# Patient Record
Sex: Male | Born: 1940 | Race: White | Hispanic: No | Marital: Married | Smoking: Former smoker
Health system: Southern US, Community
[De-identification: ages and names within clinical notes are randomized; demographics above are authoritative.]

## PROBLEM LIST (undated history)

## (undated) DIAGNOSIS — M545 Low back pain, unspecified: Secondary | ICD-10-CM

## (undated) DIAGNOSIS — I10 Essential (primary) hypertension: Secondary | ICD-10-CM

## (undated) DIAGNOSIS — G629 Polyneuropathy, unspecified: Secondary | ICD-10-CM

## (undated) DIAGNOSIS — G4700 Insomnia, unspecified: Secondary | ICD-10-CM

## (undated) DIAGNOSIS — F0390 Unspecified dementia without behavioral disturbance: Secondary | ICD-10-CM

## (undated) DIAGNOSIS — E785 Hyperlipidemia, unspecified: Secondary | ICD-10-CM

## (undated) DIAGNOSIS — F0280 Dementia in other diseases classified elsewhere without behavioral disturbance: Secondary | ICD-10-CM

## (undated) DIAGNOSIS — Z85828 Personal history of other malignant neoplasm of skin: Secondary | ICD-10-CM

## (undated) DIAGNOSIS — G4710 Hypersomnia, unspecified: Secondary | ICD-10-CM

## (undated) DIAGNOSIS — Q549 Hypospadias, unspecified: Secondary | ICD-10-CM

## (undated) DIAGNOSIS — M5430 Sciatica, unspecified side: Secondary | ICD-10-CM

## (undated) DIAGNOSIS — M791 Myalgia, unspecified site: Secondary | ICD-10-CM

## (undated) DIAGNOSIS — E079 Disorder of thyroid, unspecified: Secondary | ICD-10-CM

## (undated) DIAGNOSIS — Z9889 Other specified postprocedural states: Secondary | ICD-10-CM

## (undated) DIAGNOSIS — E119 Type 2 diabetes mellitus without complications: Secondary | ICD-10-CM

## (undated) HISTORY — DX: Disorder of thyroid, unspecified: E07.9

## (undated) HISTORY — DX: Hyperlipidemia, unspecified: E78.5

## (undated) HISTORY — DX: Polyneuropathy, unspecified: G62.9

## (undated) HISTORY — DX: Other specified postprocedural states: Z98.890

## (undated) HISTORY — DX: Insomnia, unspecified: G47.00

## (undated) HISTORY — DX: Hypersomnia, unspecified: G47.10

## (undated) HISTORY — DX: Myalgia, unspecified site: M79.10

## (undated) HISTORY — DX: Essential (primary) hypertension: I10

## (undated) HISTORY — DX: Low back pain, unspecified: M54.50

## (undated) HISTORY — DX: Low back pain: M54.5

## (undated) HISTORY — PX: URETHRAL STRICTURE DILATATION: SHX477

## (undated) HISTORY — DX: Sciatica, unspecified side: M54.30

## (undated) HISTORY — DX: Type 2 diabetes mellitus without complications: E11.9

---
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
## 1998-12-22 HISTORY — PX: OTHER SURGICAL HISTORY: SHX169

## 2003-12-23 LAB — HM COLONOSCOPY

## 2006-08-03 DIAGNOSIS — M5430 Sciatica, unspecified side: Secondary | ICD-10-CM | POA: Insufficient documentation

## 2007-11-22 DIAGNOSIS — N35919 Unspecified urethral stricture, male, unspecified site: Secondary | ICD-10-CM | POA: Insufficient documentation

## 2010-12-30 ENCOUNTER — Emergency Department: Payer: Self-pay | Admitting: Emergency Medicine

## 2010-12-30 IMAGING — CR LEFT WRIST - COMPLETE 3+ VIEW
1 series · 4 of 4 positions shown · non-contrast
Comparison: none

REASON FOR EXAM: painful
COMMENTS:

PROCEDURE:     DXR - DXR WRIST LT COMP WITH OBLIQUES  - [DATE] [DATE]
RESULT:     Images of the left wrist show severe radiocarpal degenerative
change with prominent areas spurring along the lateral aspect of the
scaphoid. A definite fractures not identified. No bony destruction is
evident.

[Series 1: view not recorded · 0.17mm/px · 4 of 4 slices shown]
[im 1/4]
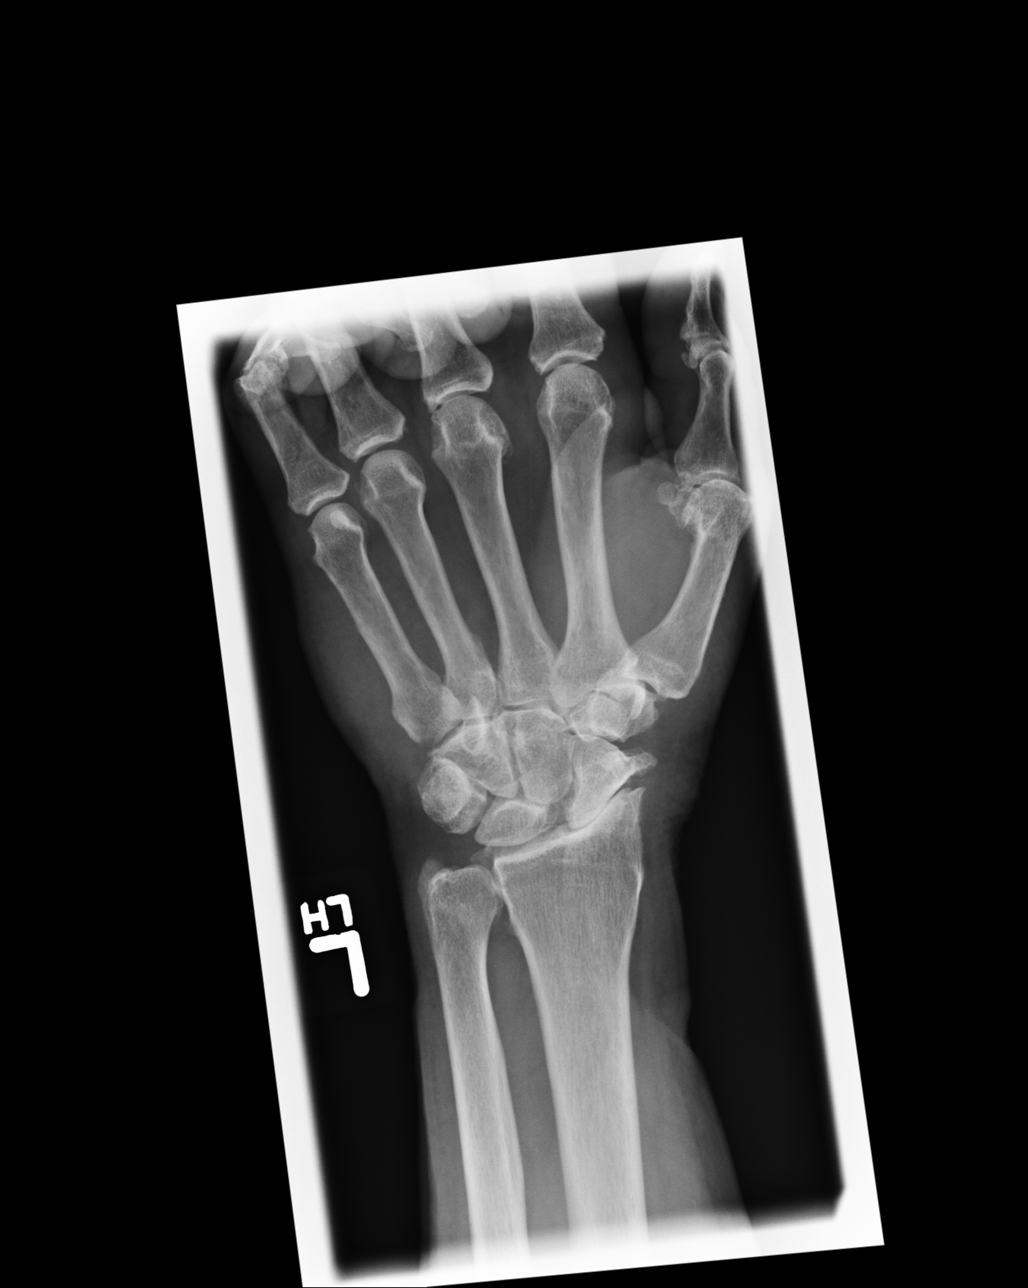
[im 2/4]
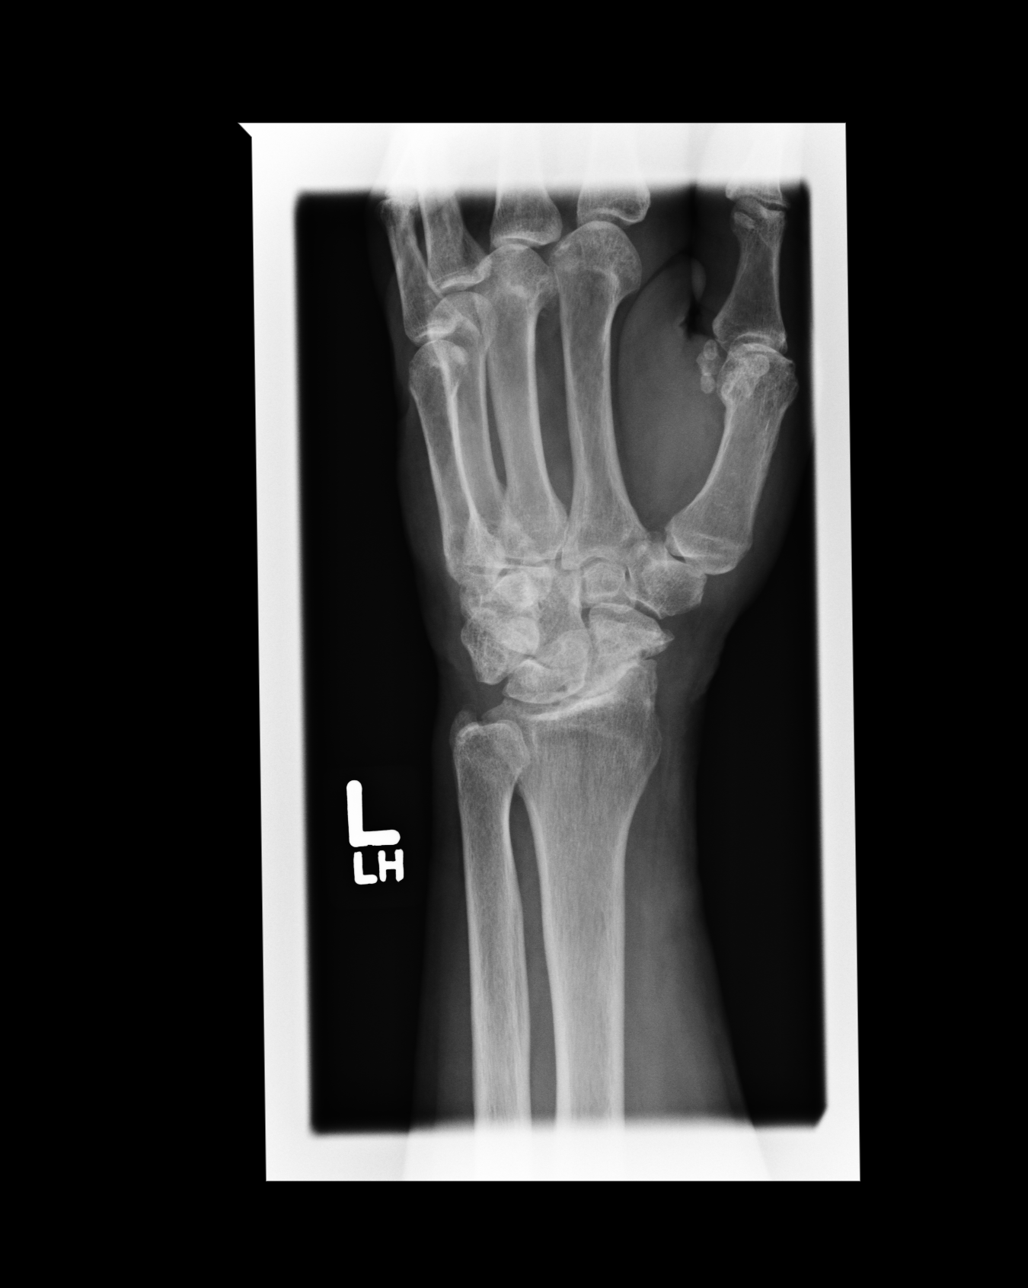
[im 3/4]
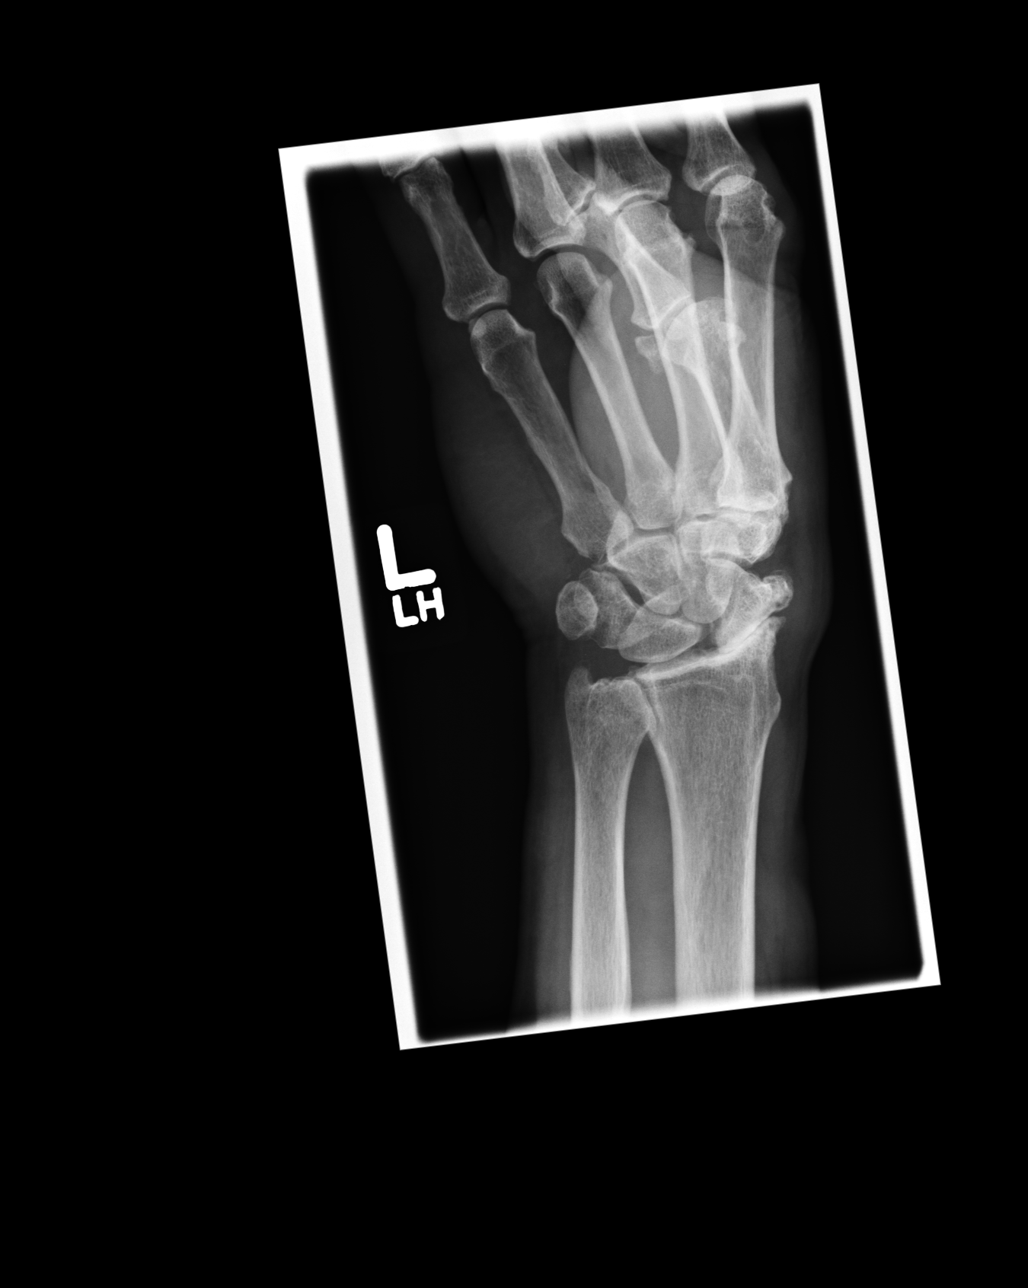
[im 4/4]
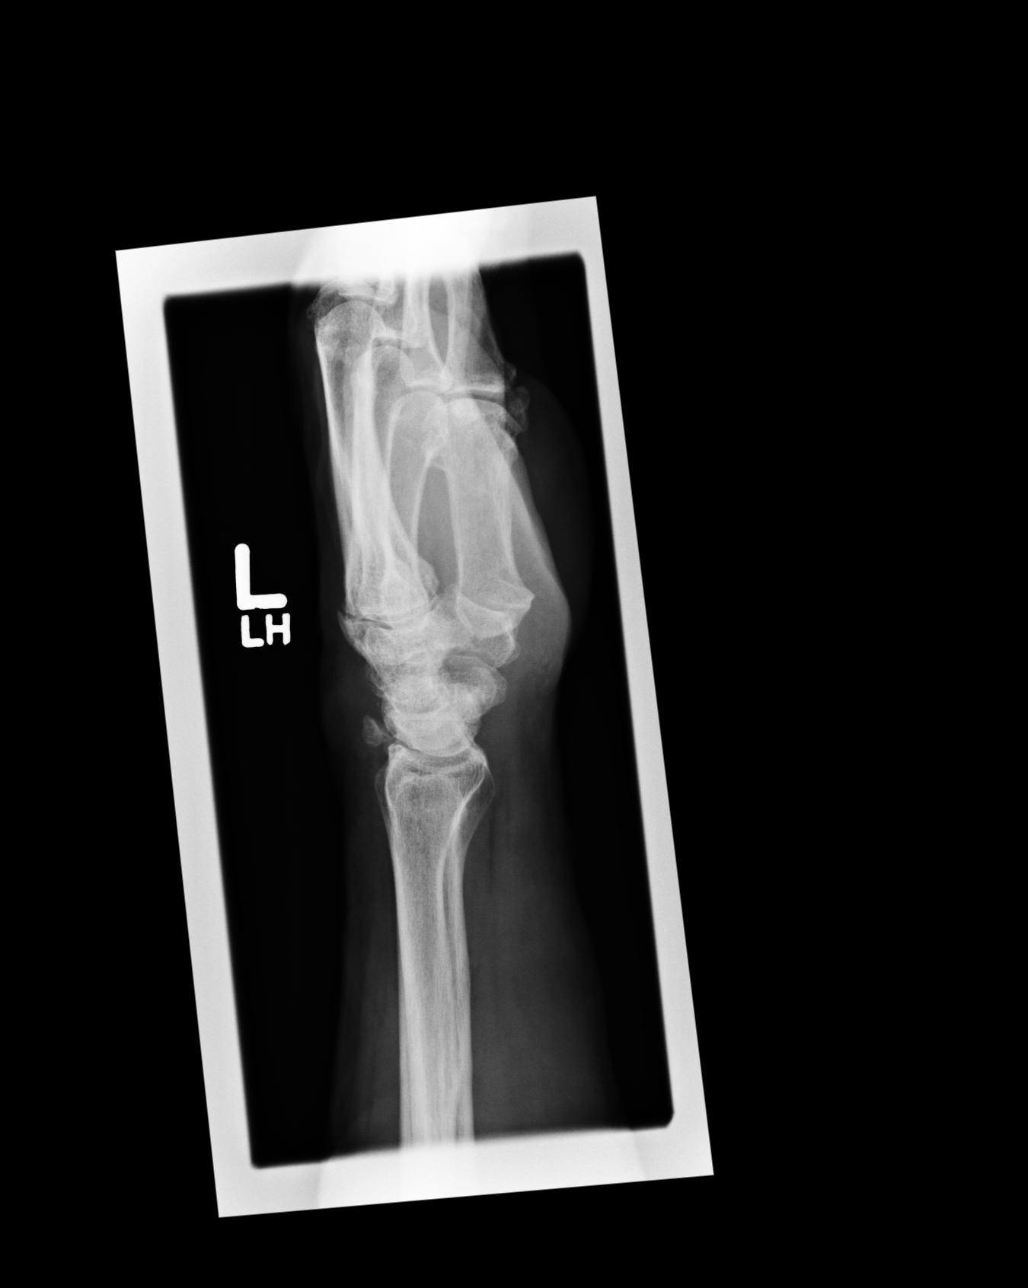

[4 of 4 positions shown; findings below may reference images not displayed]

IMPRESSION: Severe osteoarthritic changes in the carpus. If occult
fracture is a concern then MRI would be suggested.

## 2011-08-13 IMAGING — CR DG FOOT COMPLETE 3+V*R*
1 series · 1 of 1 positions shown · non-contrast
Comparison: none

[view not recorded]
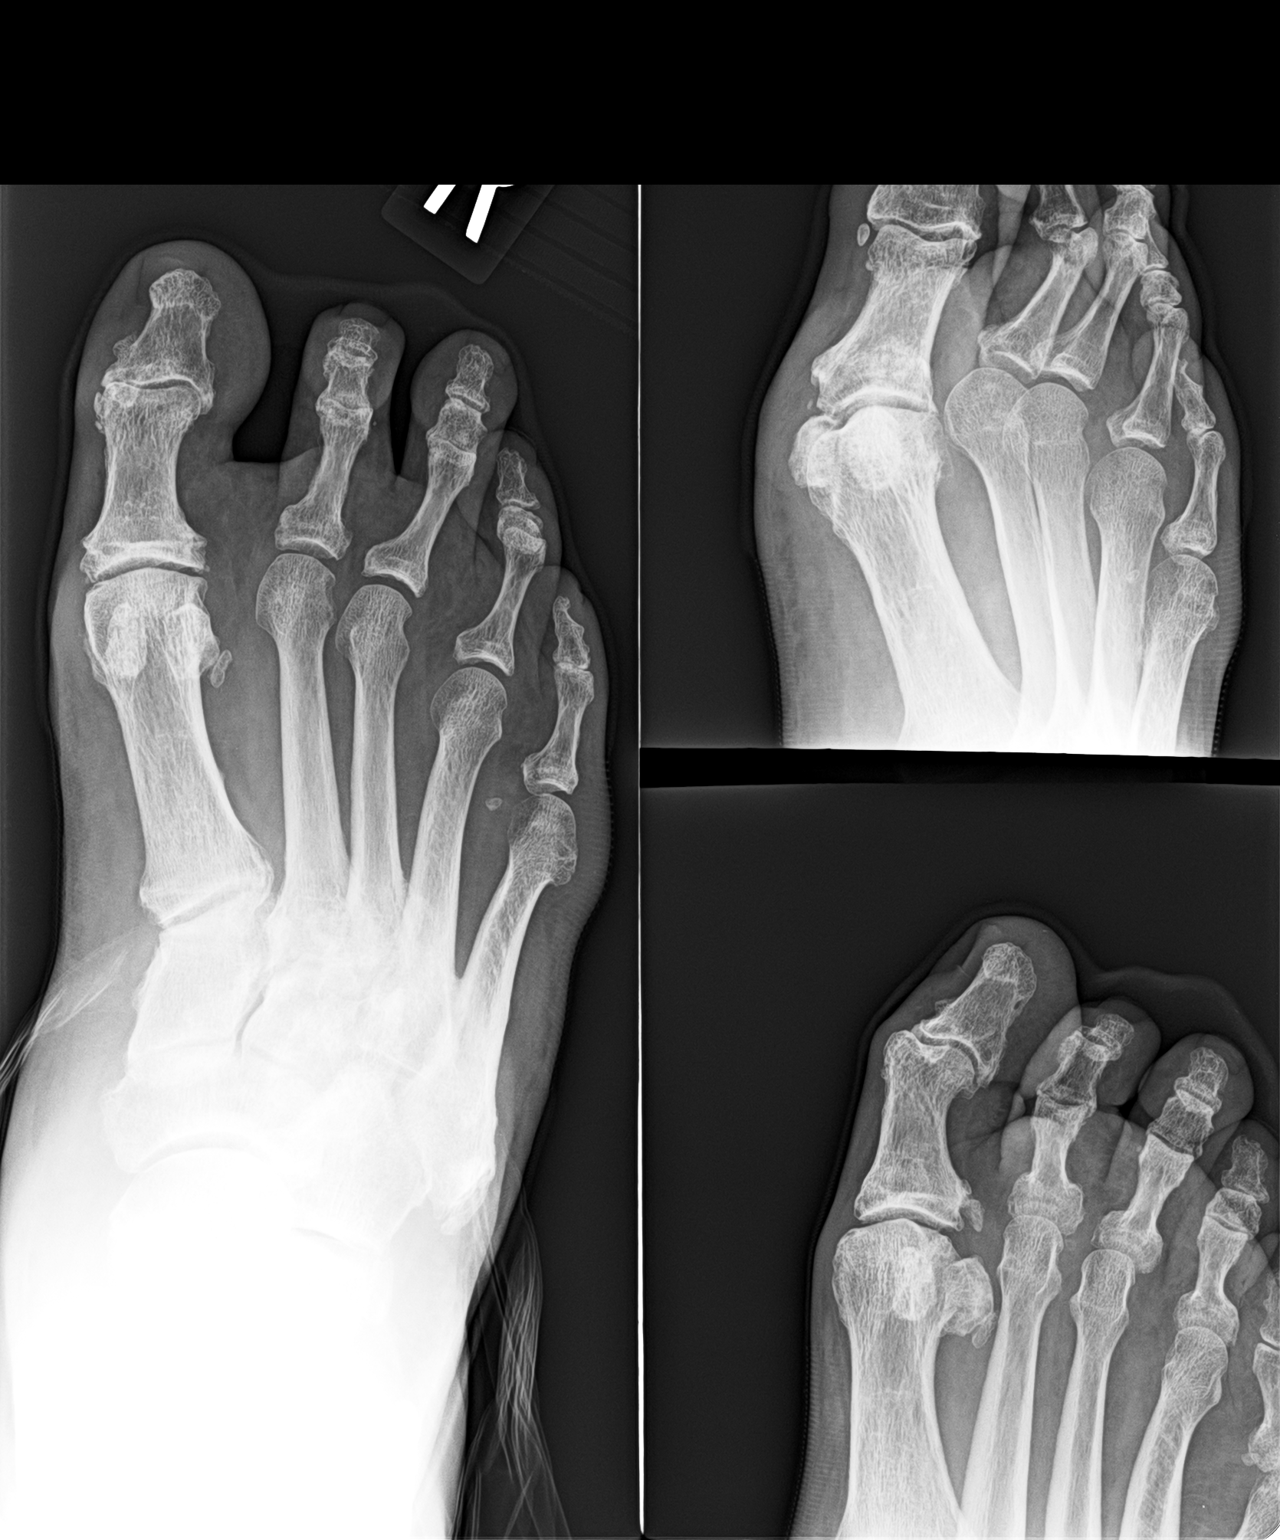

[1 of 1 positions shown; findings below may reference images not displayed]

Canned report from images found in remote index.

Refer to host system for actual result text.

## 2012-05-27 DIAGNOSIS — M19049 Primary osteoarthritis, unspecified hand: Secondary | ICD-10-CM | POA: Diagnosis not present

## 2012-05-27 DIAGNOSIS — IMO0002 Reserved for concepts with insufficient information to code with codable children: Secondary | ICD-10-CM | POA: Diagnosis not present

## 2012-06-09 DIAGNOSIS — I10 Essential (primary) hypertension: Secondary | ICD-10-CM | POA: Diagnosis not present

## 2012-06-09 DIAGNOSIS — Z0000 Encounter for general adult medical examination without abnormal findings: Secondary | ICD-10-CM | POA: Diagnosis not present

## 2012-06-09 DIAGNOSIS — E119 Type 2 diabetes mellitus without complications: Secondary | ICD-10-CM | POA: Diagnosis not present

## 2012-06-09 DIAGNOSIS — G609 Hereditary and idiopathic neuropathy, unspecified: Secondary | ICD-10-CM | POA: Diagnosis not present

## 2012-06-09 DIAGNOSIS — E663 Overweight: Secondary | ICD-10-CM | POA: Diagnosis not present

## 2012-06-09 DIAGNOSIS — Z125 Encounter for screening for malignant neoplasm of prostate: Secondary | ICD-10-CM | POA: Diagnosis not present

## 2012-06-09 DIAGNOSIS — R748 Abnormal levels of other serum enzymes: Secondary | ICD-10-CM | POA: Diagnosis not present

## 2012-06-09 LAB — HM HEPATITIS C SCREENING LAB: HM Hepatitis Screen: NEGATIVE

## 2012-06-15 DIAGNOSIS — H6120 Impacted cerumen, unspecified ear: Secondary | ICD-10-CM | POA: Diagnosis not present

## 2012-06-15 DIAGNOSIS — H903 Sensorineural hearing loss, bilateral: Secondary | ICD-10-CM | POA: Diagnosis not present

## 2012-07-07 DIAGNOSIS — L821 Other seborrheic keratosis: Secondary | ICD-10-CM | POA: Diagnosis not present

## 2012-07-07 DIAGNOSIS — L570 Actinic keratosis: Secondary | ICD-10-CM | POA: Diagnosis not present

## 2012-07-07 DIAGNOSIS — D235 Other benign neoplasm of skin of trunk: Secondary | ICD-10-CM | POA: Diagnosis not present

## 2012-07-07 DIAGNOSIS — D485 Neoplasm of uncertain behavior of skin: Secondary | ICD-10-CM | POA: Diagnosis not present

## 2012-09-16 DIAGNOSIS — Z23 Encounter for immunization: Secondary | ICD-10-CM | POA: Diagnosis not present

## 2012-10-19 DIAGNOSIS — Z23 Encounter for immunization: Secondary | ICD-10-CM | POA: Diagnosis not present

## 2012-10-19 DIAGNOSIS — E119 Type 2 diabetes mellitus without complications: Secondary | ICD-10-CM | POA: Diagnosis not present

## 2012-10-19 DIAGNOSIS — G608 Other hereditary and idiopathic neuropathies: Secondary | ICD-10-CM | POA: Diagnosis not present

## 2012-10-19 DIAGNOSIS — I10 Essential (primary) hypertension: Secondary | ICD-10-CM | POA: Diagnosis not present

## 2013-01-26 DIAGNOSIS — B488 Other specified mycoses: Secondary | ICD-10-CM | POA: Diagnosis not present

## 2013-01-26 DIAGNOSIS — L03039 Cellulitis of unspecified toe: Secondary | ICD-10-CM | POA: Diagnosis not present

## 2013-01-28 DIAGNOSIS — IMO0002 Reserved for concepts with insufficient information to code with codable children: Secondary | ICD-10-CM | POA: Diagnosis not present

## 2013-01-28 DIAGNOSIS — S91109A Unspecified open wound of unspecified toe(s) without damage to nail, initial encounter: Secondary | ICD-10-CM | POA: Diagnosis not present

## 2013-02-04 DIAGNOSIS — IMO0002 Reserved for concepts with insufficient information to code with codable children: Secondary | ICD-10-CM | POA: Diagnosis not present

## 2013-02-04 DIAGNOSIS — B351 Tinea unguium: Secondary | ICD-10-CM | POA: Diagnosis not present

## 2013-02-04 DIAGNOSIS — R897 Abnormal histological findings in specimens from other organs, systems and tissues: Secondary | ICD-10-CM | POA: Diagnosis not present

## 2013-02-14 DIAGNOSIS — S6990XA Unspecified injury of unspecified wrist, hand and finger(s), initial encounter: Secondary | ICD-10-CM | POA: Diagnosis not present

## 2013-02-14 DIAGNOSIS — M25549 Pain in joints of unspecified hand: Secondary | ICD-10-CM | POA: Diagnosis not present

## 2013-02-14 DIAGNOSIS — S63509A Unspecified sprain of unspecified wrist, initial encounter: Secondary | ICD-10-CM | POA: Diagnosis not present

## 2013-02-14 DIAGNOSIS — Y92009 Unspecified place in unspecified non-institutional (private) residence as the place of occurrence of the external cause: Secondary | ICD-10-CM | POA: Diagnosis not present

## 2013-02-17 DIAGNOSIS — M25539 Pain in unspecified wrist: Secondary | ICD-10-CM | POA: Diagnosis not present

## 2013-02-17 DIAGNOSIS — M19039 Primary osteoarthritis, unspecified wrist: Secondary | ICD-10-CM | POA: Diagnosis not present

## 2013-03-15 DIAGNOSIS — Z23 Encounter for immunization: Secondary | ICD-10-CM | POA: Diagnosis not present

## 2013-03-15 DIAGNOSIS — I10 Essential (primary) hypertension: Secondary | ICD-10-CM | POA: Diagnosis not present

## 2013-03-15 DIAGNOSIS — G4700 Insomnia, unspecified: Secondary | ICD-10-CM | POA: Diagnosis not present

## 2013-05-17 ENCOUNTER — Emergency Department: Payer: Self-pay | Admitting: Emergency Medicine

## 2013-05-17 DIAGNOSIS — S63509A Unspecified sprain of unspecified wrist, initial encounter: Secondary | ICD-10-CM | POA: Diagnosis not present

## 2013-05-17 DIAGNOSIS — I10 Essential (primary) hypertension: Secondary | ICD-10-CM | POA: Diagnosis not present

## 2013-05-17 DIAGNOSIS — L259 Unspecified contact dermatitis, unspecified cause: Secondary | ICD-10-CM | POA: Diagnosis not present

## 2013-05-17 DIAGNOSIS — M19039 Primary osteoarthritis, unspecified wrist: Secondary | ICD-10-CM | POA: Diagnosis not present

## 2013-05-17 IMAGING — CR LEFT WRIST - COMPLETE 3+ VIEW
1 series · 4 of 4 positions shown · non-contrast
Comparison: none

REASON FOR EXAM: Pain and swelling at wrist
COMMENTS:

PROCEDURE:     DXR - DXR WRIST LT COMP WITH OBLIQUES  - [DATE] [DATE]
RESULT:     Comparison: [DATE]

[Series 1: oblique · 0.17mm/px · 4 of 4 slices shown]
[im 1/4]
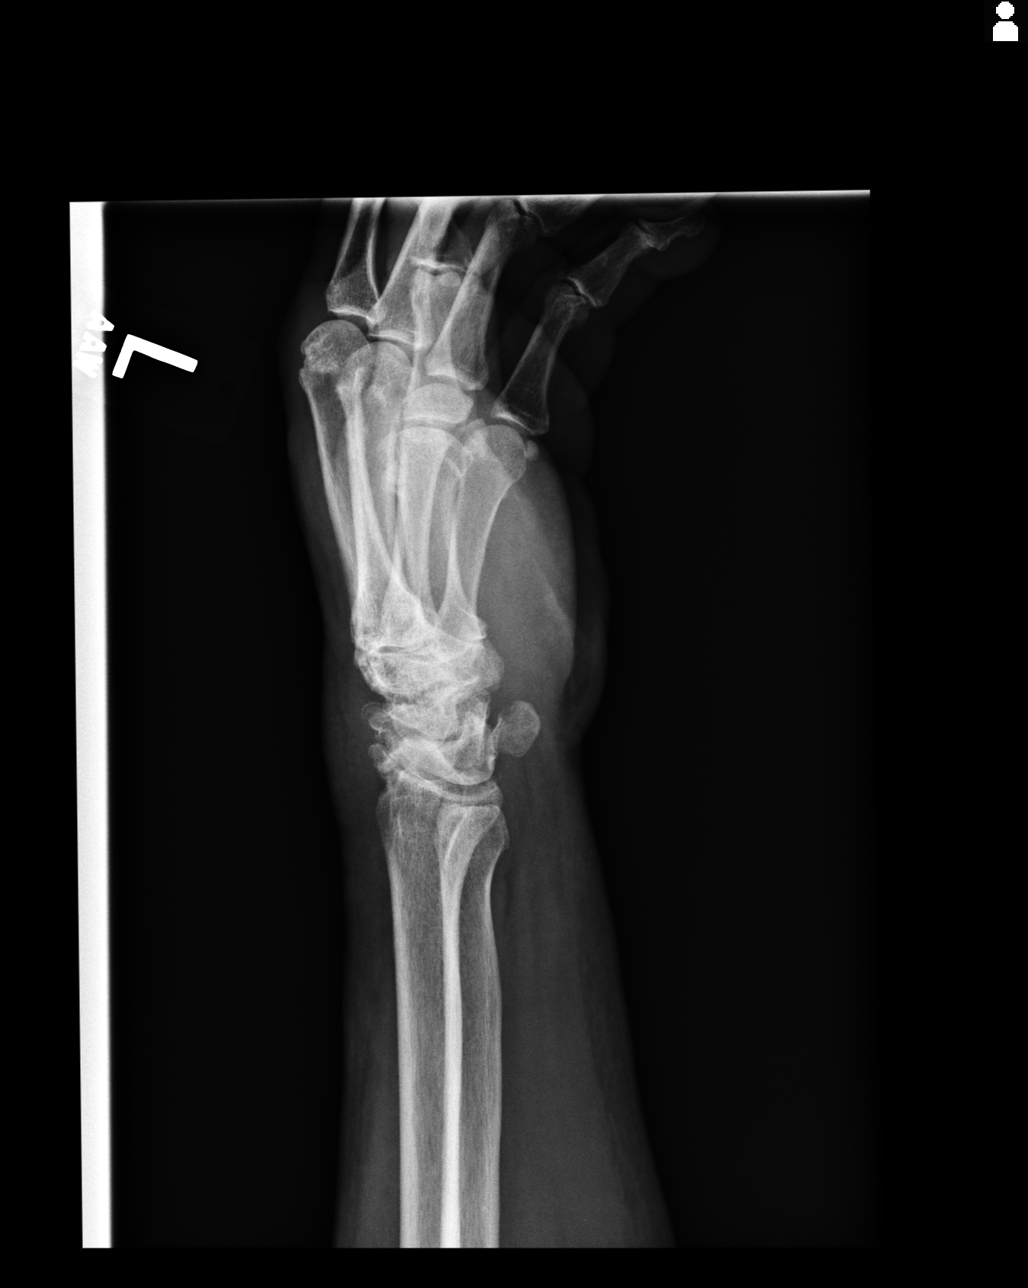
[im 2/4]
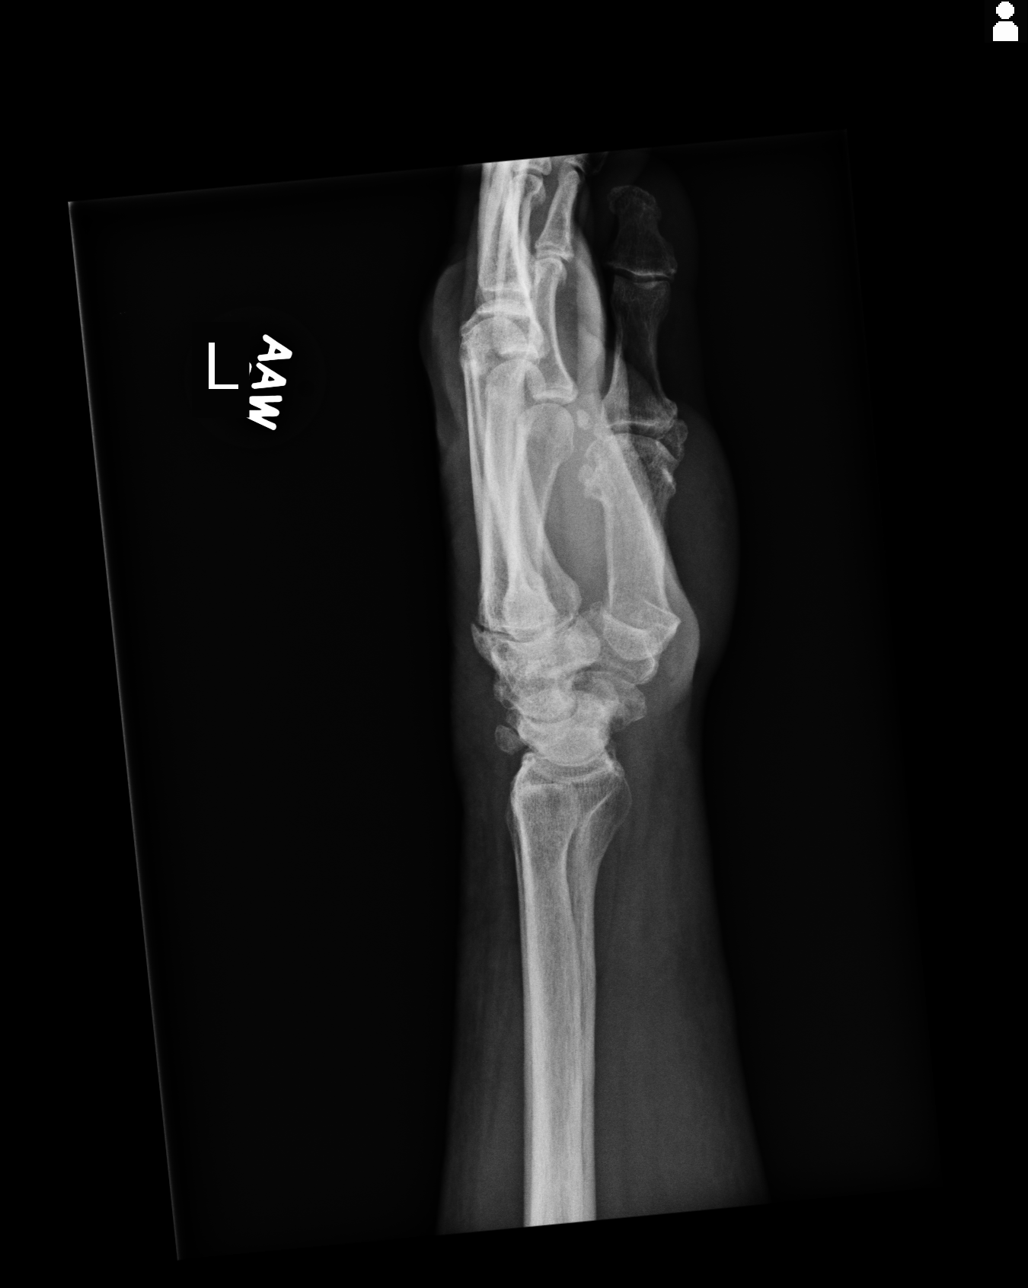
[im 3/4]
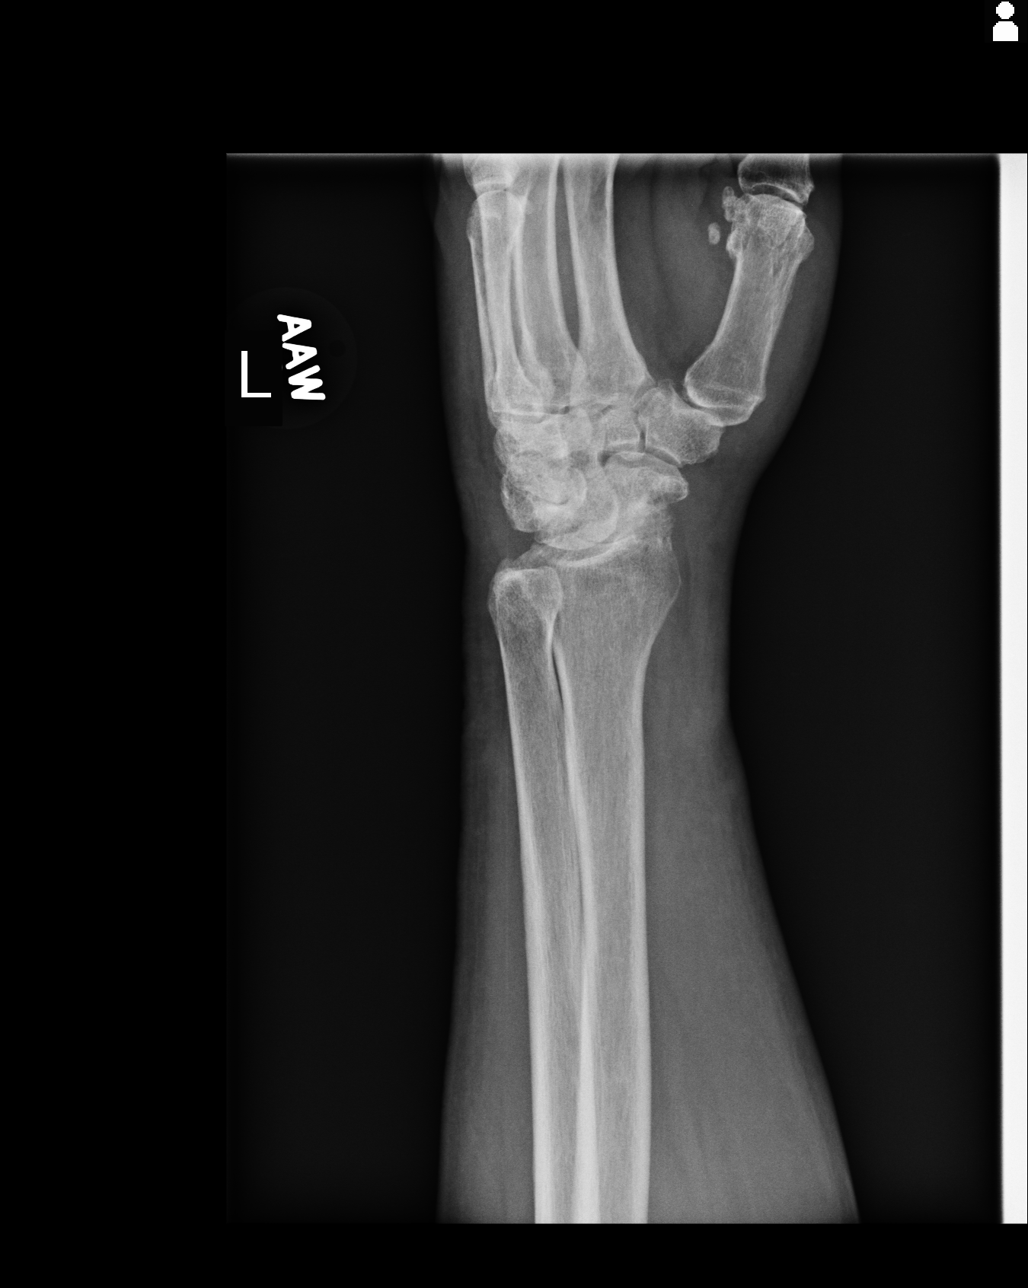
[im 4/4]
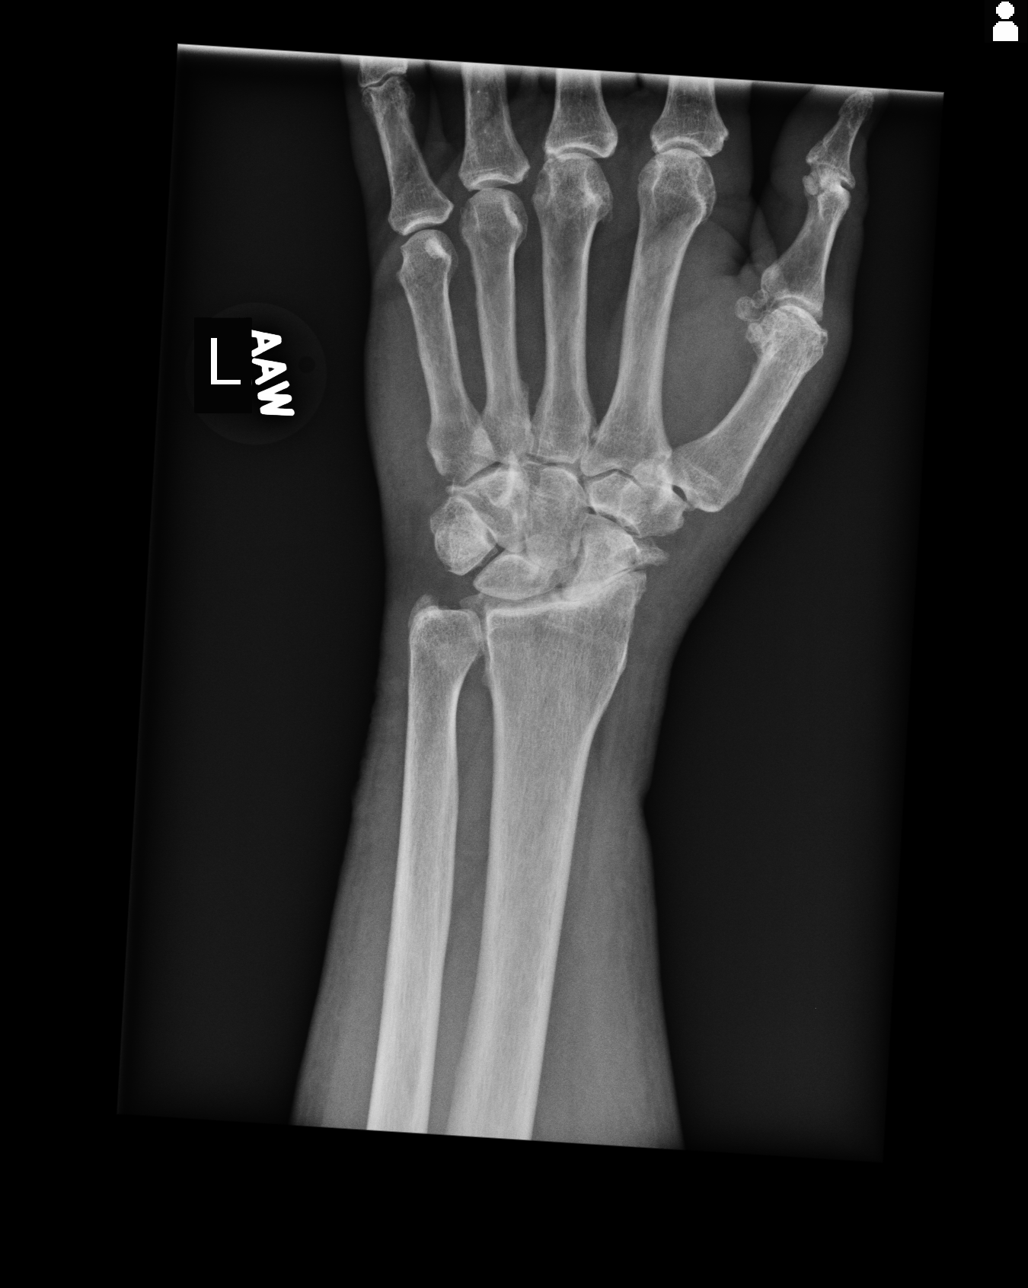

[4 of 4 positions shown; findings below may reference images not displayed]

FINDINGS: No acute fracture seen. Small ossific densities along the dorsal aspect of
the wrist are likely sequela of old prior trauma. There is degenerative
change of the radiocarpal articulation, with chronic remodeling of the
scaphoid. This is similar to slightly increased from prior.
IMPRESSION: No acute fracture. Degenerative changes in the wri[REDACTED]

## 2013-05-18 DIAGNOSIS — Z23 Encounter for immunization: Secondary | ICD-10-CM | POA: Diagnosis not present

## 2013-05-18 DIAGNOSIS — L259 Unspecified contact dermatitis, unspecified cause: Secondary | ICD-10-CM | POA: Diagnosis not present

## 2013-05-18 DIAGNOSIS — E119 Type 2 diabetes mellitus without complications: Secondary | ICD-10-CM | POA: Diagnosis not present

## 2013-05-18 DIAGNOSIS — IMO0002 Reserved for concepts with insufficient information to code with codable children: Secondary | ICD-10-CM | POA: Diagnosis not present

## 2013-05-18 DIAGNOSIS — G4700 Insomnia, unspecified: Secondary | ICD-10-CM | POA: Diagnosis not present

## 2013-05-18 DIAGNOSIS — M19049 Primary osteoarthritis, unspecified hand: Secondary | ICD-10-CM | POA: Diagnosis not present

## 2013-06-23 DIAGNOSIS — M19049 Primary osteoarthritis, unspecified hand: Secondary | ICD-10-CM | POA: Diagnosis not present

## 2013-07-07 DIAGNOSIS — L570 Actinic keratosis: Secondary | ICD-10-CM | POA: Diagnosis not present

## 2013-07-07 DIAGNOSIS — D235 Other benign neoplasm of skin of trunk: Secondary | ICD-10-CM | POA: Diagnosis not present

## 2013-07-21 DIAGNOSIS — G608 Other hereditary and idiopathic neuropathies: Secondary | ICD-10-CM | POA: Diagnosis not present

## 2013-07-21 DIAGNOSIS — I10 Essential (primary) hypertension: Secondary | ICD-10-CM | POA: Diagnosis not present

## 2013-07-21 DIAGNOSIS — D518 Other vitamin B12 deficiency anemias: Secondary | ICD-10-CM | POA: Diagnosis not present

## 2013-07-21 DIAGNOSIS — E559 Vitamin D deficiency, unspecified: Secondary | ICD-10-CM | POA: Diagnosis not present

## 2014-01-13 DIAGNOSIS — E119 Type 2 diabetes mellitus without complications: Secondary | ICD-10-CM | POA: Diagnosis not present

## 2014-01-13 DIAGNOSIS — I10 Essential (primary) hypertension: Secondary | ICD-10-CM | POA: Diagnosis not present

## 2014-01-13 DIAGNOSIS — E785 Hyperlipidemia, unspecified: Secondary | ICD-10-CM | POA: Diagnosis not present

## 2014-01-13 DIAGNOSIS — E559 Vitamin D deficiency, unspecified: Secondary | ICD-10-CM | POA: Diagnosis not present

## 2014-02-10 DIAGNOSIS — H6120 Impacted cerumen, unspecified ear: Secondary | ICD-10-CM | POA: Diagnosis not present

## 2014-02-10 DIAGNOSIS — H903 Sensorineural hearing loss, bilateral: Secondary | ICD-10-CM | POA: Diagnosis not present

## 2014-03-13 DIAGNOSIS — L0291 Cutaneous abscess, unspecified: Secondary | ICD-10-CM | POA: Diagnosis not present

## 2014-03-13 DIAGNOSIS — G4700 Insomnia, unspecified: Secondary | ICD-10-CM | POA: Diagnosis not present

## 2014-03-13 DIAGNOSIS — E119 Type 2 diabetes mellitus without complications: Secondary | ICD-10-CM | POA: Diagnosis not present

## 2014-03-13 DIAGNOSIS — L0390 Cellulitis, unspecified: Secondary | ICD-10-CM | POA: Diagnosis not present

## 2014-03-13 DIAGNOSIS — E559 Vitamin D deficiency, unspecified: Secondary | ICD-10-CM | POA: Diagnosis not present

## 2014-04-12 ENCOUNTER — Ambulatory Visit: Payer: Self-pay | Admitting: Family Medicine

## 2014-04-12 DIAGNOSIS — Z7189 Other specified counseling: Secondary | ICD-10-CM | POA: Diagnosis not present

## 2014-04-12 DIAGNOSIS — I10 Essential (primary) hypertension: Secondary | ICD-10-CM | POA: Diagnosis not present

## 2014-04-12 DIAGNOSIS — E119 Type 2 diabetes mellitus without complications: Secondary | ICD-10-CM | POA: Diagnosis not present

## 2014-04-12 DIAGNOSIS — E669 Obesity, unspecified: Secondary | ICD-10-CM | POA: Diagnosis not present

## 2014-04-21 ENCOUNTER — Ambulatory Visit: Payer: Self-pay | Admitting: Family Medicine

## 2014-05-02 DIAGNOSIS — I10 Essential (primary) hypertension: Secondary | ICD-10-CM | POA: Diagnosis not present

## 2014-05-02 DIAGNOSIS — R0609 Other forms of dyspnea: Secondary | ICD-10-CM | POA: Diagnosis not present

## 2014-05-02 DIAGNOSIS — Z125 Encounter for screening for malignant neoplasm of prostate: Secondary | ICD-10-CM | POA: Diagnosis not present

## 2014-05-02 DIAGNOSIS — Z0000 Encounter for general adult medical examination without abnormal findings: Secondary | ICD-10-CM | POA: Diagnosis not present

## 2014-05-02 DIAGNOSIS — Z23 Encounter for immunization: Secondary | ICD-10-CM | POA: Diagnosis not present

## 2014-05-02 DIAGNOSIS — J9819 Other pulmonary collapse: Secondary | ICD-10-CM | POA: Diagnosis not present

## 2014-05-02 DIAGNOSIS — E119 Type 2 diabetes mellitus without complications: Secondary | ICD-10-CM | POA: Diagnosis not present

## 2014-05-10 DIAGNOSIS — H2510 Age-related nuclear cataract, unspecified eye: Secondary | ICD-10-CM | POA: Diagnosis not present

## 2014-06-27 DIAGNOSIS — IMO0001 Reserved for inherently not codable concepts without codable children: Secondary | ICD-10-CM | POA: Diagnosis not present

## 2014-06-27 DIAGNOSIS — R5383 Other fatigue: Secondary | ICD-10-CM | POA: Diagnosis not present

## 2014-06-27 DIAGNOSIS — E785 Hyperlipidemia, unspecified: Secondary | ICD-10-CM | POA: Diagnosis not present

## 2014-06-27 DIAGNOSIS — E119 Type 2 diabetes mellitus without complications: Secondary | ICD-10-CM | POA: Diagnosis not present

## 2014-06-27 DIAGNOSIS — E559 Vitamin D deficiency, unspecified: Secondary | ICD-10-CM | POA: Diagnosis not present

## 2014-06-27 DIAGNOSIS — R5381 Other malaise: Secondary | ICD-10-CM | POA: Diagnosis not present

## 2014-06-27 DIAGNOSIS — E291 Testicular hypofunction: Secondary | ICD-10-CM | POA: Diagnosis not present

## 2014-07-27 DIAGNOSIS — IMO0001 Reserved for inherently not codable concepts without codable children: Secondary | ICD-10-CM | POA: Diagnosis not present

## 2014-07-27 DIAGNOSIS — E291 Testicular hypofunction: Secondary | ICD-10-CM | POA: Diagnosis not present

## 2014-07-27 DIAGNOSIS — E785 Hyperlipidemia, unspecified: Secondary | ICD-10-CM | POA: Diagnosis not present

## 2014-07-27 DIAGNOSIS — G4710 Hypersomnia, unspecified: Secondary | ICD-10-CM | POA: Diagnosis not present

## 2014-08-31 DIAGNOSIS — N138 Other obstructive and reflux uropathy: Secondary | ICD-10-CM | POA: Diagnosis not present

## 2014-08-31 DIAGNOSIS — N401 Enlarged prostate with lower urinary tract symptoms: Secondary | ICD-10-CM | POA: Diagnosis not present

## 2014-08-31 DIAGNOSIS — Z87448 Personal history of other diseases of urinary system: Secondary | ICD-10-CM | POA: Diagnosis not present

## 2014-08-31 DIAGNOSIS — R300 Dysuria: Secondary | ICD-10-CM | POA: Diagnosis not present

## 2014-08-31 DIAGNOSIS — E785 Hyperlipidemia, unspecified: Secondary | ICD-10-CM | POA: Diagnosis not present

## 2014-09-20 DIAGNOSIS — R339 Retention of urine, unspecified: Secondary | ICD-10-CM | POA: Insufficient documentation

## 2014-09-20 DIAGNOSIS — R350 Frequency of micturition: Secondary | ICD-10-CM | POA: Diagnosis not present

## 2014-09-20 DIAGNOSIS — R351 Nocturia: Secondary | ICD-10-CM | POA: Insufficient documentation

## 2014-09-20 DIAGNOSIS — N401 Enlarged prostate with lower urinary tract symptoms: Secondary | ICD-10-CM | POA: Diagnosis not present

## 2014-09-20 DIAGNOSIS — IMO0002 Reserved for concepts with insufficient information to code with codable children: Secondary | ICD-10-CM | POA: Diagnosis not present

## 2014-10-06 DIAGNOSIS — N390 Urinary tract infection, site not specified: Secondary | ICD-10-CM | POA: Diagnosis not present

## 2014-10-06 DIAGNOSIS — I10 Essential (primary) hypertension: Secondary | ICD-10-CM | POA: Diagnosis not present

## 2014-10-06 DIAGNOSIS — E785 Hyperlipidemia, unspecified: Secondary | ICD-10-CM | POA: Diagnosis not present

## 2014-10-06 DIAGNOSIS — Z23 Encounter for immunization: Secondary | ICD-10-CM | POA: Diagnosis not present

## 2014-10-06 DIAGNOSIS — E119 Type 2 diabetes mellitus without complications: Secondary | ICD-10-CM | POA: Diagnosis not present

## 2014-10-06 LAB — HEMOGLOBIN A1C, FINGERSTICK
ESTIMATED AVERAGE GLUCOSE: 146
HEMOGLOBIN A1C: 6.7

## 2014-10-11 DIAGNOSIS — E119 Type 2 diabetes mellitus without complications: Secondary | ICD-10-CM | POA: Diagnosis not present

## 2014-10-11 DIAGNOSIS — R609 Edema, unspecified: Secondary | ICD-10-CM | POA: Diagnosis not present

## 2014-10-11 DIAGNOSIS — R0689 Other abnormalities of breathing: Secondary | ICD-10-CM | POA: Diagnosis not present

## 2014-10-11 DIAGNOSIS — R0600 Dyspnea, unspecified: Secondary | ICD-10-CM | POA: Diagnosis not present

## 2014-10-11 DIAGNOSIS — Z23 Encounter for immunization: Secondary | ICD-10-CM | POA: Diagnosis not present

## 2014-10-11 DIAGNOSIS — E785 Hyperlipidemia, unspecified: Secondary | ICD-10-CM | POA: Diagnosis not present

## 2014-10-11 DIAGNOSIS — I10 Essential (primary) hypertension: Secondary | ICD-10-CM | POA: Diagnosis not present

## 2014-10-11 LAB — LIPID PANEL
BUN: 18
CREATININE: 0.97
GLUCOSE: 218
HCT: 45.4
HEMOGLOBIN: 15 g/dL
PLATELETS: 249
Potassium: 4.5 mmol/L
SODIUM: 140
TSH: 1.05
WBC: 8.5

## 2014-11-20 DIAGNOSIS — E785 Hyperlipidemia, unspecified: Secondary | ICD-10-CM | POA: Diagnosis not present

## 2014-11-20 LAB — LIPID PANEL
CHOLESTEROL, TOTAL: 165
HDL: 54 mg/dL (ref 35–70)
LDL Cholesterol: 90 mg/dL
TRIGLYCERIDES: 104

## 2014-12-05 DIAGNOSIS — I10 Essential (primary) hypertension: Secondary | ICD-10-CM | POA: Diagnosis not present

## 2014-12-05 DIAGNOSIS — Z23 Encounter for immunization: Secondary | ICD-10-CM | POA: Diagnosis not present

## 2014-12-05 DIAGNOSIS — N419 Inflammatory disease of prostate, unspecified: Secondary | ICD-10-CM | POA: Diagnosis not present

## 2014-12-05 DIAGNOSIS — E785 Hyperlipidemia, unspecified: Secondary | ICD-10-CM | POA: Diagnosis not present

## 2014-12-05 DIAGNOSIS — R0600 Dyspnea, unspecified: Secondary | ICD-10-CM | POA: Diagnosis not present

## 2014-12-05 DIAGNOSIS — E119 Type 2 diabetes mellitus without complications: Secondary | ICD-10-CM | POA: Diagnosis not present

## 2014-12-27 DIAGNOSIS — Z23 Encounter for immunization: Secondary | ICD-10-CM | POA: Diagnosis not present

## 2014-12-27 DIAGNOSIS — N419 Inflammatory disease of prostate, unspecified: Secondary | ICD-10-CM | POA: Diagnosis not present

## 2014-12-27 DIAGNOSIS — E119 Type 2 diabetes mellitus without complications: Secondary | ICD-10-CM | POA: Diagnosis not present

## 2014-12-27 DIAGNOSIS — R0600 Dyspnea, unspecified: Secondary | ICD-10-CM | POA: Diagnosis not present

## 2014-12-27 DIAGNOSIS — I10 Essential (primary) hypertension: Secondary | ICD-10-CM | POA: Diagnosis not present

## 2014-12-27 LAB — HEMOGLOBIN A1C, FINGERSTICK
Est. average glucose Bld gHb Est-mCnc: 180
HEMOGLOBIN A1C: 7.9

## 2015-01-23 DIAGNOSIS — E119 Type 2 diabetes mellitus without complications: Secondary | ICD-10-CM | POA: Diagnosis not present

## 2015-01-23 LAB — FECAL OCCULT BLOOD, GUAIAC: FECAL OCCULT BLD: NEGATIVE

## 2015-04-02 DIAGNOSIS — N419 Inflammatory disease of prostate, unspecified: Secondary | ICD-10-CM | POA: Diagnosis not present

## 2015-04-02 DIAGNOSIS — R0600 Dyspnea, unspecified: Secondary | ICD-10-CM | POA: Diagnosis not present

## 2015-04-02 DIAGNOSIS — I10 Essential (primary) hypertension: Secondary | ICD-10-CM | POA: Diagnosis not present

## 2015-04-02 DIAGNOSIS — S81802A Unspecified open wound, left lower leg, initial encounter: Secondary | ICD-10-CM | POA: Diagnosis not present

## 2015-04-02 DIAGNOSIS — Z23 Encounter for immunization: Secondary | ICD-10-CM | POA: Diagnosis not present

## 2015-04-09 DIAGNOSIS — E1165 Type 2 diabetes mellitus with hyperglycemia: Secondary | ICD-10-CM | POA: Diagnosis not present

## 2015-04-09 DIAGNOSIS — L03119 Cellulitis of unspecified part of limb: Secondary | ICD-10-CM | POA: Diagnosis not present

## 2015-04-09 DIAGNOSIS — R0600 Dyspnea, unspecified: Secondary | ICD-10-CM | POA: Diagnosis not present

## 2015-04-09 DIAGNOSIS — I10 Essential (primary) hypertension: Secondary | ICD-10-CM | POA: Diagnosis not present

## 2015-04-09 DIAGNOSIS — Z23 Encounter for immunization: Secondary | ICD-10-CM | POA: Diagnosis not present

## 2015-04-09 LAB — HEMOGLOBIN A1C, FINGERSTICK
ESTIMATED AVERAGE GLUCOSE: 209
HEMOGLOBIN A1C: 8.9

## 2015-04-10 DIAGNOSIS — L570 Actinic keratosis: Secondary | ICD-10-CM | POA: Diagnosis not present

## 2015-04-10 DIAGNOSIS — L821 Other seborrheic keratosis: Secondary | ICD-10-CM | POA: Diagnosis not present

## 2015-04-10 DIAGNOSIS — D485 Neoplasm of uncertain behavior of skin: Secondary | ICD-10-CM | POA: Diagnosis not present

## 2015-04-10 DIAGNOSIS — D2272 Melanocytic nevi of left lower limb, including hip: Secondary | ICD-10-CM | POA: Diagnosis not present

## 2015-04-10 DIAGNOSIS — C44622 Squamous cell carcinoma of skin of right upper limb, including shoulder: Secondary | ICD-10-CM | POA: Diagnosis not present

## 2015-04-10 DIAGNOSIS — D2262 Melanocytic nevi of left upper limb, including shoulder: Secondary | ICD-10-CM | POA: Diagnosis not present

## 2015-04-10 DIAGNOSIS — X32XXXA Exposure to sunlight, initial encounter: Secondary | ICD-10-CM | POA: Diagnosis not present

## 2015-04-10 DIAGNOSIS — D225 Melanocytic nevi of trunk: Secondary | ICD-10-CM | POA: Diagnosis not present

## 2015-04-17 DIAGNOSIS — Z6841 Body Mass Index (BMI) 40.0 and over, adult: Secondary | ICD-10-CM | POA: Diagnosis not present

## 2015-04-17 DIAGNOSIS — Z87828 Personal history of other (healed) physical injury and trauma: Secondary | ICD-10-CM | POA: Insufficient documentation

## 2015-04-17 DIAGNOSIS — R972 Elevated prostate specific antigen [PSA]: Secondary | ICD-10-CM | POA: Diagnosis not present

## 2015-04-17 DIAGNOSIS — E559 Vitamin D deficiency, unspecified: Secondary | ICD-10-CM | POA: Diagnosis not present

## 2015-04-17 DIAGNOSIS — IMO0002 Reserved for concepts with insufficient information to code with codable children: Secondary | ICD-10-CM | POA: Insufficient documentation

## 2015-04-17 DIAGNOSIS — L570 Actinic keratosis: Secondary | ICD-10-CM | POA: Insufficient documentation

## 2015-04-17 DIAGNOSIS — E119 Type 2 diabetes mellitus without complications: Secondary | ICD-10-CM | POA: Diagnosis not present

## 2015-04-17 DIAGNOSIS — E782 Mixed hyperlipidemia: Secondary | ICD-10-CM | POA: Diagnosis not present

## 2015-04-17 DIAGNOSIS — Z9889 Other specified postprocedural states: Secondary | ICD-10-CM

## 2015-04-17 DIAGNOSIS — I10 Essential (primary) hypertension: Secondary | ICD-10-CM | POA: Diagnosis not present

## 2015-04-17 DIAGNOSIS — Z125 Encounter for screening for malignant neoplasm of prostate: Secondary | ICD-10-CM | POA: Diagnosis not present

## 2015-04-17 HISTORY — DX: Other specified postprocedural states: Z98.890

## 2015-04-19 DIAGNOSIS — Z23 Encounter for immunization: Secondary | ICD-10-CM | POA: Diagnosis not present

## 2015-04-19 DIAGNOSIS — L03119 Cellulitis of unspecified part of limb: Secondary | ICD-10-CM | POA: Diagnosis not present

## 2015-04-19 DIAGNOSIS — R0600 Dyspnea, unspecified: Secondary | ICD-10-CM | POA: Diagnosis not present

## 2015-04-19 DIAGNOSIS — I10 Essential (primary) hypertension: Secondary | ICD-10-CM | POA: Diagnosis not present

## 2015-04-19 DIAGNOSIS — E1165 Type 2 diabetes mellitus with hyperglycemia: Secondary | ICD-10-CM | POA: Diagnosis not present

## 2015-05-02 DIAGNOSIS — E1165 Type 2 diabetes mellitus with hyperglycemia: Secondary | ICD-10-CM | POA: Diagnosis not present

## 2015-05-02 DIAGNOSIS — I10 Essential (primary) hypertension: Secondary | ICD-10-CM | POA: Diagnosis not present

## 2015-05-02 DIAGNOSIS — Z23 Encounter for immunization: Secondary | ICD-10-CM | POA: Diagnosis not present

## 2015-05-02 DIAGNOSIS — R0600 Dyspnea, unspecified: Secondary | ICD-10-CM | POA: Diagnosis not present

## 2015-05-02 DIAGNOSIS — L03119 Cellulitis of unspecified part of limb: Secondary | ICD-10-CM | POA: Diagnosis not present

## 2015-05-02 LAB — LIPID PANEL
BUN: 17
Creat: 0.9
Glucose: 281
Potassium: 4.3 mmol/L
Sodium: 139

## 2015-05-16 DIAGNOSIS — E1165 Type 2 diabetes mellitus with hyperglycemia: Secondary | ICD-10-CM | POA: Diagnosis not present

## 2015-05-16 DIAGNOSIS — N390 Urinary tract infection, site not specified: Secondary | ICD-10-CM | POA: Diagnosis not present

## 2015-05-16 DIAGNOSIS — R0600 Dyspnea, unspecified: Secondary | ICD-10-CM | POA: Diagnosis not present

## 2015-05-16 DIAGNOSIS — Z23 Encounter for immunization: Secondary | ICD-10-CM | POA: Diagnosis not present

## 2015-05-16 DIAGNOSIS — I10 Essential (primary) hypertension: Secondary | ICD-10-CM | POA: Diagnosis not present

## 2015-05-22 ENCOUNTER — Encounter: Payer: Self-pay | Admitting: *Deleted

## 2015-05-22 DIAGNOSIS — Z91199 Patient's noncompliance with other medical treatment and regimen due to unspecified reason: Secondary | ICD-10-CM | POA: Insufficient documentation

## 2015-05-22 DIAGNOSIS — E291 Testicular hypofunction: Secondary | ICD-10-CM | POA: Insufficient documentation

## 2015-05-22 DIAGNOSIS — L259 Unspecified contact dermatitis, unspecified cause: Secondary | ICD-10-CM | POA: Insufficient documentation

## 2015-05-22 DIAGNOSIS — M25561 Pain in right knee: Secondary | ICD-10-CM | POA: Insufficient documentation

## 2015-05-22 DIAGNOSIS — Z9119 Patient's noncompliance with other medical treatment and regimen: Secondary | ICD-10-CM | POA: Insufficient documentation

## 2015-05-22 DIAGNOSIS — N419 Inflammatory disease of prostate, unspecified: Secondary | ICD-10-CM | POA: Insufficient documentation

## 2015-05-22 DIAGNOSIS — R5383 Other fatigue: Secondary | ICD-10-CM | POA: Insufficient documentation

## 2015-05-22 DIAGNOSIS — R609 Edema, unspecified: Secondary | ICD-10-CM | POA: Insufficient documentation

## 2015-05-22 DIAGNOSIS — S81809A Unspecified open wound, unspecified lower leg, initial encounter: Secondary | ICD-10-CM

## 2015-05-22 DIAGNOSIS — N401 Enlarged prostate with lower urinary tract symptoms: Secondary | ICD-10-CM

## 2015-05-22 DIAGNOSIS — R413 Other amnesia: Secondary | ICD-10-CM | POA: Insufficient documentation

## 2015-05-22 DIAGNOSIS — M791 Myalgia, unspecified site: Secondary | ICD-10-CM | POA: Insufficient documentation

## 2015-05-22 DIAGNOSIS — T07XXXA Unspecified multiple injuries, initial encounter: Secondary | ICD-10-CM | POA: Insufficient documentation

## 2015-05-22 DIAGNOSIS — L03113 Cellulitis of right upper limb: Secondary | ICD-10-CM | POA: Insufficient documentation

## 2015-05-22 DIAGNOSIS — F444 Conversion disorder with motor symptom or deficit: Secondary | ICD-10-CM | POA: Insufficient documentation

## 2015-05-22 DIAGNOSIS — E119 Type 2 diabetes mellitus without complications: Secondary | ICD-10-CM | POA: Insufficient documentation

## 2015-05-22 DIAGNOSIS — S81802A Unspecified open wound, left lower leg, initial encounter: Secondary | ICD-10-CM | POA: Insufficient documentation

## 2015-05-22 DIAGNOSIS — R3911 Hesitancy of micturition: Secondary | ICD-10-CM | POA: Insufficient documentation

## 2015-05-22 DIAGNOSIS — R0600 Dyspnea, unspecified: Secondary | ICD-10-CM | POA: Insufficient documentation

## 2015-05-22 DIAGNOSIS — R0609 Other forms of dyspnea: Secondary | ICD-10-CM

## 2015-05-22 DIAGNOSIS — S81009A Unspecified open wound, unspecified knee, initial encounter: Secondary | ICD-10-CM | POA: Insufficient documentation

## 2015-05-22 DIAGNOSIS — Z794 Long term (current) use of insulin: Secondary | ICD-10-CM

## 2015-05-22 DIAGNOSIS — L03119 Cellulitis of unspecified part of limb: Secondary | ICD-10-CM | POA: Insufficient documentation

## 2015-05-22 DIAGNOSIS — N138 Other obstructive and reflux uropathy: Secondary | ICD-10-CM | POA: Insufficient documentation

## 2015-05-22 DIAGNOSIS — S91009A Unspecified open wound, unspecified ankle, initial encounter: Secondary | ICD-10-CM

## 2015-05-24 ENCOUNTER — Encounter: Payer: Self-pay | Admitting: *Deleted

## 2015-05-28 DIAGNOSIS — D0461 Carcinoma in situ of skin of right upper limb, including shoulder: Secondary | ICD-10-CM | POA: Diagnosis not present

## 2015-05-28 DIAGNOSIS — C44622 Squamous cell carcinoma of skin of right upper limb, including shoulder: Secondary | ICD-10-CM | POA: Diagnosis not present

## 2015-05-28 DIAGNOSIS — D485 Neoplasm of uncertain behavior of skin: Secondary | ICD-10-CM | POA: Diagnosis not present

## 2015-05-28 DIAGNOSIS — C44219 Basal cell carcinoma of skin of left ear and external auricular canal: Secondary | ICD-10-CM | POA: Diagnosis not present

## 2015-05-29 DIAGNOSIS — R399 Unspecified symptoms and signs involving the genitourinary system: Secondary | ICD-10-CM | POA: Diagnosis not present

## 2015-05-29 DIAGNOSIS — N3945 Continuous leakage: Secondary | ICD-10-CM | POA: Diagnosis not present

## 2015-05-29 DIAGNOSIS — E65 Localized adiposity: Secondary | ICD-10-CM | POA: Diagnosis not present

## 2015-05-29 DIAGNOSIS — E782 Mixed hyperlipidemia: Secondary | ICD-10-CM | POA: Diagnosis not present

## 2015-05-29 DIAGNOSIS — E08621 Diabetes mellitus due to underlying condition with foot ulcer: Secondary | ICD-10-CM | POA: Diagnosis not present

## 2015-05-29 DIAGNOSIS — E119 Type 2 diabetes mellitus without complications: Secondary | ICD-10-CM | POA: Diagnosis not present

## 2015-05-31 ENCOUNTER — Encounter: Payer: Self-pay | Admitting: Family Medicine

## 2015-05-31 ENCOUNTER — Ambulatory Visit (INDEPENDENT_AMBULATORY_CARE_PROVIDER_SITE_OTHER): Payer: Medicare Other | Admitting: Family Medicine

## 2015-05-31 VITALS — BP 120/60 | HR 81 | Temp 98.0°F | Resp 18 | Ht 72.0 in | Wt 302.0 lb

## 2015-05-31 DIAGNOSIS — G4700 Insomnia, unspecified: Secondary | ICD-10-CM

## 2015-05-31 DIAGNOSIS — E1165 Type 2 diabetes mellitus with hyperglycemia: Secondary | ICD-10-CM

## 2015-05-31 DIAGNOSIS — IMO0002 Reserved for concepts with insufficient information to code with codable children: Secondary | ICD-10-CM

## 2015-05-31 DIAGNOSIS — E559 Vitamin D deficiency, unspecified: Secondary | ICD-10-CM

## 2015-05-31 DIAGNOSIS — R7309 Other abnormal glucose: Secondary | ICD-10-CM | POA: Diagnosis not present

## 2015-05-31 DIAGNOSIS — S81802D Unspecified open wound, left lower leg, subsequent encounter: Secondary | ICD-10-CM

## 2015-05-31 DIAGNOSIS — N401 Enlarged prostate with lower urinary tract symptoms: Secondary | ICD-10-CM | POA: Diagnosis not present

## 2015-05-31 DIAGNOSIS — R739 Hyperglycemia, unspecified: Secondary | ICD-10-CM

## 2015-05-31 DIAGNOSIS — N138 Other obstructive and reflux uropathy: Secondary | ICD-10-CM

## 2015-05-31 LAB — POCT GLYCOSYLATED HEMOGLOBIN (HGB A1C)
ESTIMATED AVERAGE GLUCOSE: 200
Hemoglobin A1C: 8.6

## 2015-05-31 NOTE — Progress Notes (Signed)
Patient: Norman Palmer Male    DOB: 1941/03/25   74 y.o.   MRN: [id number] Visit Date: 05/31/2015  Today's Provider: Lelon Huh, MD   Chief Complaint  Patient presents with  . Cellulitis    re check  . Hyperglycemia    follow up   Subjective:    Hyperglycemia This is a new (last seen 05/02/2015; labs were ordered showing elevated blood sugar) problem.  Patient was advised to follow up in 4 weeks.    Cellulitis of the Leg:  Patient was last seen 05/02/2015 and no changes were made. Patient was advised to continue using Mupirocin ointment three times daily as prescribes. Patient comes in today stating his leg has completely healed and he has not needed to use the Mupirocin ointment.    Previous Medications   CELECOXIB (CELEBREX) 200 MG CAPSULE    Take 1 capsule by mouth daily.   CIPROFLOXACIN (CIPRO) 500 MG TABLET    Take 1 tablet by mouth 2 (two) times daily.   GLIPIZIDE-METFORMIN (METAGLIP) 2.5-500 MG PER TABLET    Take 1 tablet by mouth. 1 Tablet 2 Times daily With Meals   GLUCOSE BLOOD TEST STRIP    1 strip by Other route daily.   LIRAGLUTIDE 18 MG/3ML SOPN    Inject 1.8 mg into the skin daily.   MUPIROCIN 2 % KIT    Apply 1 application topically 3 (three) times daily.   NIFEDIPINE (PROCARDIA-XL/ADALAT CC) 60 MG 24 HR TABLET    Take 1 tablet by mouth daily.   POTASSIUM 99 MG TABS    Take 1 tablet by mouth daily.   PREGABALIN (LYRICA) 150 MG CAPSULE    Take 1 capsule by mouth 2 (two) times daily.   SALINE 0.9 % (SOLN) SOLN    1 Solution cleansing left leg would daily then apply non-stick dressing and bandage   SIMVASTATIN (ZOCOR) 20 MG TABLET    Take 1 tablet by mouth daily.   TRIAMTERENE-HYDROCHLOROTHIAZIDE (DYAZIDE) 37.5-25 MG PER CAPSULE    Take 1 capsule by mouth daily.   VITAMIN D, ERGOCALCIFEROL, (DRISDOL) 50000 UNITS CAPS CAPSULE    Take 1 capsule by mouth once a week.    Review of Systems  History  Substance Use Topics  . Smoking status: Former Smoker -- 2.00  packs/day for 30 years    Types: Cigarettes  . Smokeless tobacco: Not on file  . Alcohol Use: 0.0 oz/week    0 Standard drinks or equivalent per week   Objective:   There were no vitals taken for this visit.  Physical Exam   General Appearance:    Alert, cooperative, no distress  Eyes:    PERRL, conjunctiva/corneas clear, EOM's intact       Lungs:     Clear to auscultation bilaterally, respirations unlabored  Heart:    Regular rate and rhythm  Neurologic:   Awake, alert, oriented x 3. No apparent focal neurological           defect.   Skin   Wound left lower leg mostly scabbed with small open area, no erythema         Assessment & Plan:     1. Diabetes Slightly improved,  But only using 1.35m Victoza. Advised to increase to 1.881mdaily.  - POCT HgB A1C  2. BPH with obstruction/lower urinary tract symptoms Sx resolved by patient report. Recommend follow up with urologist  3. Leg wound, left, subsequent encounter Mostly resolved. Continue applying mupiricin daily  and keep covered with adhesive bandage until healed.

## 2015-05-31 NOTE — Patient Instructions (Addendum)
   Be sure to take 1.8mg  of Victoza every day   Continue mupiricin ointment until leg wound is healed.

## 2015-06-12 ENCOUNTER — Telehealth: Payer: Self-pay | Admitting: Family Medicine

## 2015-06-12 ENCOUNTER — Other Ambulatory Visit: Payer: Self-pay | Admitting: Family Medicine

## 2015-06-12 DIAGNOSIS — N138 Other obstructive and reflux uropathy: Secondary | ICD-10-CM

## 2015-06-12 DIAGNOSIS — N411 Chronic prostatitis: Secondary | ICD-10-CM

## 2015-06-12 DIAGNOSIS — N401 Enlarged prostate with lower urinary tract symptoms: Secondary | ICD-10-CM

## 2015-06-12 DIAGNOSIS — G9009 Other idiopathic peripheral autonomic neuropathy: Secondary | ICD-10-CM

## 2015-06-12 NOTE — Telephone Encounter (Signed)
Please call in the following medication.     [facility] [phone]   Surescripts Out Interface  34 minutes ago   (12:26 PM)             Requested Medications     Medication name:  Name from pharmacy:  LYRICA 150 MG capsule LYRICA 150MG  CAP    Sig: TAKE ONE CAPSULE TWICE A DAY    Dispense: 180 capsule (Pharmacy requested 180 each)   Refills: 1

## 2015-06-12 NOTE — Telephone Encounter (Signed)
Pt was seen in May for UTI.He states he was prescribed Cipro but is not any better and would like referral to see a urologist.Please add order to workque if approved

## 2015-06-13 DIAGNOSIS — Z85828 Personal history of other malignant neoplasm of skin: Secondary | ICD-10-CM | POA: Diagnosis not present

## 2015-06-13 DIAGNOSIS — C44311 Basal cell carcinoma of skin of nose: Secondary | ICD-10-CM | POA: Diagnosis not present

## 2015-06-13 DIAGNOSIS — N411 Chronic prostatitis: Secondary | ICD-10-CM | POA: Insufficient documentation

## 2015-06-13 NOTE — Telephone Encounter (Signed)
Order entered. 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 Thanks.

## 2015-06-14 ENCOUNTER — Other Ambulatory Visit: Payer: Self-pay | Admitting: Family Medicine

## 2015-06-14 DIAGNOSIS — I10 Essential (primary) hypertension: Secondary | ICD-10-CM

## 2015-06-18 DIAGNOSIS — N358 Other urethral stricture: Secondary | ICD-10-CM | POA: Diagnosis not present

## 2015-06-18 DIAGNOSIS — R339 Retention of urine, unspecified: Secondary | ICD-10-CM | POA: Diagnosis not present

## 2015-06-18 DIAGNOSIS — Z6838 Body mass index (BMI) 38.0-38.9, adult: Secondary | ICD-10-CM | POA: Diagnosis not present

## 2015-06-18 NOTE — Telephone Encounter (Signed)
Medication has been discontinued.

## 2015-06-19 DIAGNOSIS — E118 Type 2 diabetes mellitus with unspecified complications: Secondary | ICD-10-CM | POA: Diagnosis not present

## 2015-06-19 DIAGNOSIS — R350 Frequency of micturition: Secondary | ICD-10-CM | POA: Diagnosis not present

## 2015-06-19 DIAGNOSIS — Z79899 Other long term (current) drug therapy: Secondary | ICD-10-CM | POA: Diagnosis not present

## 2015-06-19 DIAGNOSIS — N401 Enlarged prostate with lower urinary tract symptoms: Secondary | ICD-10-CM | POA: Diagnosis not present

## 2015-06-19 DIAGNOSIS — E669 Obesity, unspecified: Secondary | ICD-10-CM | POA: Diagnosis not present

## 2015-06-19 DIAGNOSIS — Z87891 Personal history of nicotine dependence: Secondary | ICD-10-CM | POA: Diagnosis not present

## 2015-06-19 DIAGNOSIS — Z01818 Encounter for other preprocedural examination: Secondary | ICD-10-CM | POA: Diagnosis not present

## 2015-06-19 DIAGNOSIS — Z6838 Body mass index (BMI) 38.0-38.9, adult: Secondary | ICD-10-CM | POA: Diagnosis not present

## 2015-06-19 DIAGNOSIS — R339 Retention of urine, unspecified: Secondary | ICD-10-CM | POA: Diagnosis not present

## 2015-06-19 DIAGNOSIS — R338 Other retention of urine: Secondary | ICD-10-CM | POA: Diagnosis not present

## 2015-06-19 DIAGNOSIS — R32 Unspecified urinary incontinence: Secondary | ICD-10-CM | POA: Diagnosis not present

## 2015-06-19 DIAGNOSIS — R600 Localized edema: Secondary | ICD-10-CM | POA: Diagnosis not present

## 2015-06-19 DIAGNOSIS — M1990 Unspecified osteoarthritis, unspecified site: Secondary | ICD-10-CM | POA: Diagnosis not present

## 2015-06-19 DIAGNOSIS — B2790 Infectious mononucleosis, unspecified without complication: Secondary | ICD-10-CM | POA: Diagnosis not present

## 2015-06-19 DIAGNOSIS — E119 Type 2 diabetes mellitus without complications: Secondary | ICD-10-CM | POA: Diagnosis not present

## 2015-06-19 DIAGNOSIS — R351 Nocturia: Secondary | ICD-10-CM | POA: Diagnosis not present

## 2015-06-19 DIAGNOSIS — R3914 Feeling of incomplete bladder emptying: Secondary | ICD-10-CM | POA: Diagnosis not present

## 2015-06-19 DIAGNOSIS — I10 Essential (primary) hypertension: Secondary | ICD-10-CM | POA: Diagnosis not present

## 2015-06-19 DIAGNOSIS — N35011 Post-traumatic bulbous urethral stricture: Secondary | ICD-10-CM | POA: Diagnosis not present

## 2015-06-19 DIAGNOSIS — N358 Other urethral stricture: Secondary | ICD-10-CM | POA: Diagnosis not present

## 2015-06-20 DIAGNOSIS — R338 Other retention of urine: Secondary | ICD-10-CM | POA: Diagnosis not present

## 2015-06-20 DIAGNOSIS — N358 Other urethral stricture: Secondary | ICD-10-CM | POA: Diagnosis not present

## 2015-06-20 DIAGNOSIS — R339 Retention of urine, unspecified: Secondary | ICD-10-CM | POA: Diagnosis not present

## 2015-06-20 DIAGNOSIS — R32 Unspecified urinary incontinence: Secondary | ICD-10-CM | POA: Diagnosis not present

## 2015-06-20 DIAGNOSIS — R351 Nocturia: Secondary | ICD-10-CM | POA: Diagnosis not present

## 2015-06-20 DIAGNOSIS — N35011 Post-traumatic bulbous urethral stricture: Secondary | ICD-10-CM | POA: Diagnosis not present

## 2015-06-20 DIAGNOSIS — R350 Frequency of micturition: Secondary | ICD-10-CM | POA: Diagnosis not present

## 2015-06-20 DIAGNOSIS — N401 Enlarged prostate with lower urinary tract symptoms: Secondary | ICD-10-CM | POA: Diagnosis not present

## 2015-07-03 ENCOUNTER — Ambulatory Visit: Payer: Medicare Other

## 2015-07-17 DIAGNOSIS — E119 Type 2 diabetes mellitus without complications: Secondary | ICD-10-CM | POA: Diagnosis not present

## 2015-07-17 DIAGNOSIS — N401 Enlarged prostate with lower urinary tract symptoms: Secondary | ICD-10-CM | POA: Diagnosis not present

## 2015-07-17 DIAGNOSIS — R413 Other amnesia: Secondary | ICD-10-CM | POA: Diagnosis not present

## 2015-07-17 DIAGNOSIS — I10 Essential (primary) hypertension: Secondary | ICD-10-CM | POA: Diagnosis not present

## 2015-07-24 DIAGNOSIS — R413 Other amnesia: Secondary | ICD-10-CM | POA: Diagnosis not present

## 2015-07-24 DIAGNOSIS — I10 Essential (primary) hypertension: Secondary | ICD-10-CM | POA: Diagnosis not present

## 2015-07-24 DIAGNOSIS — E119 Type 2 diabetes mellitus without complications: Secondary | ICD-10-CM | POA: Diagnosis not present

## 2015-07-27 DIAGNOSIS — R339 Retention of urine, unspecified: Secondary | ICD-10-CM | POA: Diagnosis not present

## 2015-07-27 DIAGNOSIS — R8281 Pyuria: Secondary | ICD-10-CM | POA: Insufficient documentation

## 2015-07-27 DIAGNOSIS — N358 Other urethral stricture: Secondary | ICD-10-CM | POA: Diagnosis not present

## 2015-07-27 DIAGNOSIS — N401 Enlarged prostate with lower urinary tract symptoms: Secondary | ICD-10-CM | POA: Diagnosis not present

## 2015-07-27 DIAGNOSIS — N390 Urinary tract infection, site not specified: Secondary | ICD-10-CM | POA: Diagnosis not present

## 2015-08-01 ENCOUNTER — Other Ambulatory Visit: Payer: Self-pay | Admitting: Family Medicine

## 2015-08-22 DIAGNOSIS — A4902 Methicillin resistant Staphylococcus aureus infection, unspecified site: Secondary | ICD-10-CM

## 2015-08-22 DIAGNOSIS — N390 Urinary tract infection, site not specified: Secondary | ICD-10-CM | POA: Diagnosis not present

## 2015-08-22 HISTORY — DX: Methicillin resistant Staphylococcus aureus infection, unspecified site: A49.02

## 2015-08-30 ENCOUNTER — Ambulatory Visit: Payer: Medicare Other | Admitting: Family Medicine

## 2015-09-03 ENCOUNTER — Ambulatory Visit: Payer: Medicare Other | Admitting: Family Medicine

## 2015-09-05 ENCOUNTER — Encounter: Payer: Self-pay | Admitting: Family Medicine

## 2015-09-05 ENCOUNTER — Ambulatory Visit (INDEPENDENT_AMBULATORY_CARE_PROVIDER_SITE_OTHER): Payer: Medicare Other | Admitting: Family Medicine

## 2015-09-05 VITALS — BP 118/54 | HR 87 | Temp 98.7°F | Resp 16 | Wt 305.0 lb

## 2015-09-05 DIAGNOSIS — E119 Type 2 diabetes mellitus without complications: Secondary | ICD-10-CM | POA: Diagnosis not present

## 2015-09-05 DIAGNOSIS — Z87828 Personal history of other (healed) physical injury and trauma: Secondary | ICD-10-CM

## 2015-09-05 DIAGNOSIS — Z23 Encounter for immunization: Secondary | ICD-10-CM | POA: Diagnosis not present

## 2015-09-05 DIAGNOSIS — IMO0002 Reserved for concepts with insufficient information to code with codable children: Secondary | ICD-10-CM

## 2015-09-05 DIAGNOSIS — N358 Other urethral stricture: Secondary | ICD-10-CM

## 2015-09-05 DIAGNOSIS — M1990 Unspecified osteoarthritis, unspecified site: Secondary | ICD-10-CM | POA: Insufficient documentation

## 2015-09-05 DIAGNOSIS — I10 Essential (primary) hypertension: Secondary | ICD-10-CM

## 2015-09-05 DIAGNOSIS — G4710 Hypersomnia, unspecified: Secondary | ICD-10-CM

## 2015-09-05 DIAGNOSIS — Z87448 Personal history of other diseases of urinary system: Secondary | ICD-10-CM | POA: Diagnosis not present

## 2015-09-05 LAB — POCT GLYCOSYLATED HEMOGLOBIN (HGB A1C)
ESTIMATED AVERAGE GLUCOSE: 160
HEMOGLOBIN A1C: 7.3

## 2015-09-05 NOTE — Progress Notes (Signed)
Patient: Norman Palmer Male    DOB: 04-15-1941   74 y.o.   MRN: [id number] Visit Date: 09/05/2015  Today's Provider: Lelon Huh, MD   Chief Complaint  Patient presents with  . Diabetes    follow-up, Victosa increased to 1.8 mg    Subjective:    HPI  Diabetes Mellitus Type II, Follow-up:   Lab Results  Component Value Date   HGBA1C 8.6 05/31/2015   HGBA1C 8.9 04/09/2015   HGBA1C 7.9 12/27/2014    Last seen for diabetes 3 months ago.  Management since then includes increase the Victoza to 1.08 mg. He reports excellent compliance with treatment. He is not having side effects. none Current symptoms include none and have been stable. Home blood sugar records: fasting range: 150's  Episodes of hypoglycemia? no   Current Insulin Regimen: Victoza 1.8 mg daily Most Recent Eye Exam: 6 - 8 months ago Weight trend: increasing steadily Prior visit with dietician: no Current diet: in general, a "healthy" diet   Current exercise: walking  Pertinent Labs:    Component Value Date/Time   CHOL 165 11/20/2014   HLD     LDL     TRIG 104 11/20/2014   CREATININE 0.90 05/02/2015    Wt Readings from Last 3 Encounters:  09/05/15 305 lb (138.347 kg)  05/31/15 302 lb (136.986 kg)  05/16/15 301 lb (136.533 kg)     Hypertension, follow-up:  BP Readings from Last 3 Encounters:  09/05/15 118/54  05/31/15 120/60  05/16/15 124/60    He was last seen for hypertension 3 months ago.  BP at that visit was 120/60 . Management since that visit includes continuing same medications. Marland Kitchen He reports good compliance with treatment. He is not having side effects.  He is not exercising.  Patient denies chest pain, dyspnea, fatigue, irregular heart beat, lower extremity edema, palpitations, syncope and tachypnea.       Weight trend: increasing steadily Wt Readings from Last 3 Encounters:  09/05/15 305 lb (138.347 kg)  05/31/15 302 lb (136.986 kg)  05/16/15 301 lb (136.533  kg)    Current diet: in general, an "unhealthy" diet  ------------------------------------------------------------------------     No Known Allergies Previous Medications   CELECOXIB (CELEBREX) 200 MG CAPSULE    Take 1 capsule by mouth daily.   GLIPIZIDE-METFORMIN (METAGLIP) 2.5-500 MG PER TABLET    Take 1 tablet by mouth. 1 Tablet 2 Times daily With Meals   GLUCOSE BLOOD TEST STRIP    1 strip by Other route daily.   LIRAGLUTIDE 18 MG/3ML SOPN    Inject 1.8 mg into the skin daily.   LYRICA 150 MG CAPSULE    TAKE ONE CAPSULE TWICE A DAY   NIFEDIPINE (PROCARDIA-XL/ADALAT CC) 60 MG 24 HR TABLET    Take 1 tablet by mouth daily.   POTASSIUM 99 MG TABS    Take 1 tablet by mouth daily.   SIMVASTATIN (ZOCOR) 20 MG TABLET    Take 1 tablet by mouth daily.   TRIAMTERENE-HYDROCHLOROTHIAZIDE (DYAZIDE) 37.5-25 MG PER CAPSULE    TAKE ONE CAPSULE DAILY USUALLY IN THE MORNING   VITAMIN D, ERGOCALCIFEROL, (DRISDOL) 50000 UNITS CAPS CAPSULE    TAKE ONE CAPSULE ONCE A WEEK    Review of Systems  Constitutional: Positive for fever, chills and fatigue.  HENT: Positive for congestion and ear pain.   Eyes: Positive for redness.  Respiratory: Positive for apnea, cough, chest tightness and shortness of breath.   Endocrine:  Positive for polyuria.  Genitourinary: Positive for dysuria, frequency, flank pain and enuresis.  Neurological: Positive for dizziness, tremors, seizures, speech difficulty and numbness.  Psychiatric/Behavioral: Positive for confusion and decreased concentration.    Social History  Substance Use Topics  . Smoking status: Former Smoker -- 2.00 packs/day for 30 years    Types: Cigarettes  . Smokeless tobacco: Not on file  . Alcohol Use: 0.0 oz/week    0 Standard drinks or equivalent per week     Comment: occasionally   Objective:   BP 118/54 mmHg  Pulse 87  Temp(Src) 98.7 F (37.1 C) (Oral)  Resp 16  Wt 305 lb (138.347 kg)  SpO2 95%  Physical Exam  General Appearance:     Alert, cooperative, no distress, obese  Eyes:    PERRL, conjunctiva/corneas clear, EOM's intact       Lungs:     Clear to auscultation bilaterally, respirations unlabored  Heart:    Regular rate and rhythm 1+ bilateral LE edema  Neurologic:   Awake, alert, oriented x 3. No apparent focal neurological           defect.        Results for orders placed or performed in visit on 09/05/15  POCT glycosylated hemoglobin (Hb A1C)  Result Value Ref Range   Hemoglobin A1C 7.3    Est. average glucose Bld gHb Est-mCnc 160         Assessment & Plan:     1. Type 2 diabetes mellitus without complication Doing much better since going up to 1.8mg  Victoza. Continue current medications.   - POCT glycosylated hemoglobin (Hb A1C)  2. Essential hypertension Continue current medications.  well controlled   3. Need for influenza vaccination  - Flu vaccine HIGH DOSE PF       Lelon Huh, MD  [facility]

## 2015-09-11 DIAGNOSIS — R413 Other amnesia: Secondary | ICD-10-CM | POA: Diagnosis not present

## 2015-09-11 DIAGNOSIS — I10 Essential (primary) hypertension: Secondary | ICD-10-CM | POA: Diagnosis not present

## 2015-09-11 DIAGNOSIS — E119 Type 2 diabetes mellitus without complications: Secondary | ICD-10-CM | POA: Diagnosis not present

## 2015-09-11 DIAGNOSIS — I69998 Other sequelae following unspecified cerebrovascular disease: Secondary | ICD-10-CM | POA: Diagnosis not present

## 2015-09-11 DIAGNOSIS — G4733 Obstructive sleep apnea (adult) (pediatric): Secondary | ICD-10-CM | POA: Diagnosis not present

## 2015-09-14 DIAGNOSIS — L821 Other seborrheic keratosis: Secondary | ICD-10-CM | POA: Diagnosis not present

## 2015-09-14 DIAGNOSIS — X32XXXA Exposure to sunlight, initial encounter: Secondary | ICD-10-CM | POA: Diagnosis not present

## 2015-09-14 DIAGNOSIS — Z85828 Personal history of other malignant neoplasm of skin: Secondary | ICD-10-CM | POA: Diagnosis not present

## 2015-09-14 DIAGNOSIS — L570 Actinic keratosis: Secondary | ICD-10-CM | POA: Diagnosis not present

## 2015-09-21 ENCOUNTER — Other Ambulatory Visit: Payer: Self-pay

## 2015-09-21 DIAGNOSIS — IMO0002 Reserved for concepts with insufficient information to code with codable children: Secondary | ICD-10-CM

## 2015-09-21 DIAGNOSIS — E1165 Type 2 diabetes mellitus with hyperglycemia: Secondary | ICD-10-CM

## 2015-09-21 MED ORDER — GLIPIZIDE-METFORMIN HCL 2.5-500 MG PO TABS: 1.0000 | ORAL_TABLET | Freq: Two times a day (BID) | ORAL | Status: DC

## 2015-09-21 NOTE — Telephone Encounter (Signed)
Pharmacy requesting refill. [place].

## 2015-10-18 DIAGNOSIS — E119 Type 2 diabetes mellitus without complications: Secondary | ICD-10-CM | POA: Diagnosis not present

## 2015-10-18 DIAGNOSIS — Z6841 Body Mass Index (BMI) 40.0 and over, adult: Secondary | ICD-10-CM | POA: Diagnosis not present

## 2015-10-18 DIAGNOSIS — N401 Enlarged prostate with lower urinary tract symptoms: Secondary | ICD-10-CM | POA: Diagnosis not present

## 2015-10-18 DIAGNOSIS — R413 Other amnesia: Secondary | ICD-10-CM | POA: Diagnosis not present

## 2015-10-18 DIAGNOSIS — E559 Vitamin D deficiency, unspecified: Secondary | ICD-10-CM | POA: Diagnosis not present

## 2015-10-18 DIAGNOSIS — I10 Essential (primary) hypertension: Secondary | ICD-10-CM | POA: Diagnosis not present

## 2015-10-18 DIAGNOSIS — G609 Hereditary and idiopathic neuropathy, unspecified: Secondary | ICD-10-CM | POA: Diagnosis not present

## 2015-10-18 DIAGNOSIS — E782 Mixed hyperlipidemia: Secondary | ICD-10-CM | POA: Diagnosis not present

## 2015-10-30 DIAGNOSIS — R339 Retention of urine, unspecified: Secondary | ICD-10-CM | POA: Diagnosis not present

## 2015-10-30 DIAGNOSIS — N401 Enlarged prostate with lower urinary tract symptoms: Secondary | ICD-10-CM | POA: Diagnosis not present

## 2015-10-30 DIAGNOSIS — N138 Other obstructive and reflux uropathy: Secondary | ICD-10-CM | POA: Diagnosis not present

## 2015-10-30 DIAGNOSIS — Z6841 Body Mass Index (BMI) 40.0 and over, adult: Secondary | ICD-10-CM | POA: Diagnosis not present

## 2015-10-30 DIAGNOSIS — N358 Other urethral stricture: Secondary | ICD-10-CM | POA: Diagnosis not present

## 2015-10-30 DIAGNOSIS — R350 Frequency of micturition: Secondary | ICD-10-CM | POA: Diagnosis not present

## 2015-11-02 ENCOUNTER — Ambulatory Visit (INDEPENDENT_AMBULATORY_CARE_PROVIDER_SITE_OTHER): Payer: Medicare Other | Admitting: Family Medicine

## 2015-11-02 ENCOUNTER — Encounter: Payer: Self-pay | Admitting: Family Medicine

## 2015-11-02 VITALS — BP 130/50 | HR 101 | Temp 98.5°F | Resp 18 | Wt 311.0 lb

## 2015-11-02 DIAGNOSIS — J069 Acute upper respiratory infection, unspecified: Secondary | ICD-10-CM

## 2015-11-02 NOTE — Patient Instructions (Signed)
Discussed use of Mucinex D for congestion, Delsym for cough, and Benadryl for postnasal drainage 

## 2015-11-02 NOTE — Progress Notes (Signed)
Subjective:     Patient ID: Norman Palmer, male   DOB: [birth year], 74 y.o.   MRN: [id number]  HPI  Chief Complaint  Patient presents with  . Sore Throat    Patient comes in office today with concerns of sore throat and congestion over the past 8hrs  States his sore throat is better but congestion remains. Reports his 43 year old grandson is ill as well.   Review of Systems  Constitutional: Negative for fever and chills.       Objective:   Physical Exam  Constitutional: He appears well-developed and well-nourished. No distress.  Ears: T.M's obscured by cerumen Throat: no tonsillar enlargement or exudate Neck: no cervical adenopathy Lungs: clear     Assessment:    1. Upper respiratory infection    Plan:    Discussed use of Mucinex D for congestion, Delsym for cough, and Benadryl for postnasal drainage

## 2015-12-18 ENCOUNTER — Other Ambulatory Visit: Payer: Self-pay | Admitting: Family Medicine

## 2015-12-18 NOTE — Telephone Encounter (Signed)
Please call in Lyrica

## 2015-12-19 NOTE — Telephone Encounter (Signed)
Rx called in to pharmacy. 

## 2016-01-01 DIAGNOSIS — M7541 Impingement syndrome of right shoulder: Secondary | ICD-10-CM | POA: Diagnosis not present

## 2016-01-09 DIAGNOSIS — M7541 Impingement syndrome of right shoulder: Secondary | ICD-10-CM | POA: Diagnosis not present

## 2016-04-23 LAB — COLOGUARD: Cologuard: NEGATIVE

## 2016-05-30 ENCOUNTER — Other Ambulatory Visit: Payer: Self-pay | Admitting: Family Medicine

## 2016-05-30 DIAGNOSIS — Z794 Long term (current) use of insulin: Principal | ICD-10-CM

## 2016-05-30 DIAGNOSIS — I10 Essential (primary) hypertension: Secondary | ICD-10-CM

## 2016-05-30 DIAGNOSIS — E1165 Type 2 diabetes mellitus with hyperglycemia: Secondary | ICD-10-CM

## 2016-05-30 DIAGNOSIS — E118 Type 2 diabetes mellitus with unspecified complications: Principal | ICD-10-CM

## 2016-05-30 DIAGNOSIS — IMO0002 Reserved for concepts with insufficient information to code with codable children: Secondary | ICD-10-CM

## 2016-05-31 NOTE — Telephone Encounter (Signed)
Please advise patient have sent prescription for 30 days of medications, but he is due for follow up o.v. For diabetes.

## 2016-06-16 DIAGNOSIS — H6123 Impacted cerumen, bilateral: Secondary | ICD-10-CM | POA: Diagnosis not present

## 2016-06-16 DIAGNOSIS — H60333 Swimmer's ear, bilateral: Secondary | ICD-10-CM | POA: Diagnosis not present

## 2016-06-23 DIAGNOSIS — H60333 Swimmer's ear, bilateral: Secondary | ICD-10-CM | POA: Diagnosis not present

## 2016-06-23 DIAGNOSIS — H6121 Impacted cerumen, right ear: Secondary | ICD-10-CM | POA: Diagnosis not present

## 2016-07-01 ENCOUNTER — Other Ambulatory Visit: Payer: Self-pay | Admitting: Family Medicine

## 2016-07-01 DIAGNOSIS — Z8639 Personal history of other endocrine, nutritional and metabolic disease: Secondary | ICD-10-CM | POA: Diagnosis not present

## 2016-07-01 DIAGNOSIS — I10 Essential (primary) hypertension: Secondary | ICD-10-CM | POA: Diagnosis not present

## 2016-07-01 DIAGNOSIS — E119 Type 2 diabetes mellitus without complications: Secondary | ICD-10-CM | POA: Diagnosis not present

## 2016-07-01 DIAGNOSIS — N138 Other obstructive and reflux uropathy: Secondary | ICD-10-CM

## 2016-07-01 DIAGNOSIS — Z6841 Body Mass Index (BMI) 40.0 and over, adult: Secondary | ICD-10-CM | POA: Diagnosis not present

## 2016-07-01 DIAGNOSIS — G4733 Obstructive sleep apnea (adult) (pediatric): Secondary | ICD-10-CM | POA: Diagnosis not present

## 2016-07-01 DIAGNOSIS — N401 Enlarged prostate with lower urinary tract symptoms: Principal | ICD-10-CM

## 2016-07-14 ENCOUNTER — Other Ambulatory Visit: Payer: Self-pay | Admitting: Family Medicine

## 2016-07-14 DIAGNOSIS — E118 Type 2 diabetes mellitus with unspecified complications: Principal | ICD-10-CM

## 2016-07-14 DIAGNOSIS — H524 Presbyopia: Secondary | ICD-10-CM | POA: Diagnosis not present

## 2016-07-14 DIAGNOSIS — IMO0002 Reserved for concepts with insufficient information to code with codable children: Secondary | ICD-10-CM

## 2016-07-14 DIAGNOSIS — Z7984 Long term (current) use of oral hypoglycemic drugs: Secondary | ICD-10-CM | POA: Diagnosis not present

## 2016-07-14 DIAGNOSIS — Z794 Long term (current) use of insulin: Principal | ICD-10-CM

## 2016-07-14 DIAGNOSIS — E1165 Type 2 diabetes mellitus with hyperglycemia: Secondary | ICD-10-CM

## 2016-07-14 DIAGNOSIS — H2513 Age-related nuclear cataract, bilateral: Secondary | ICD-10-CM | POA: Diagnosis not present

## 2016-07-14 DIAGNOSIS — E119 Type 2 diabetes mellitus without complications: Secondary | ICD-10-CM | POA: Diagnosis not present

## 2016-07-14 LAB — HM DIABETES EYE EXAM

## 2016-07-22 ENCOUNTER — Encounter: Payer: Self-pay | Admitting: Family Medicine

## 2016-07-23 ENCOUNTER — Other Ambulatory Visit: Payer: Self-pay | Admitting: Family Medicine

## 2016-07-23 DIAGNOSIS — I10 Essential (primary) hypertension: Secondary | ICD-10-CM

## 2016-07-23 NOTE — Telephone Encounter (Signed)
Tried calling patient. Left message to call back. 

## 2016-07-23 NOTE — Telephone Encounter (Signed)
This patient has not been seen for diabetes follow up in nearly a year. He needs to scheduled o.v. Before we can approve any refills.

## 2016-07-24 NOTE — Telephone Encounter (Signed)
Spoke with pt and told him he needed an appt. He declined making an appt right now.  Thanks Con Memos

## 2016-07-28 ENCOUNTER — Other Ambulatory Visit: Payer: Self-pay | Admitting: *Deleted

## 2016-07-29 NOTE — Telephone Encounter (Signed)
Rx called in to pharmacy. 

## 2016-08-14 DIAGNOSIS — G4733 Obstructive sleep apnea (adult) (pediatric): Secondary | ICD-10-CM | POA: Diagnosis not present

## 2016-09-05 ENCOUNTER — Other Ambulatory Visit: Payer: Self-pay | Admitting: Family Medicine

## 2016-09-26 ENCOUNTER — Ambulatory Visit: Payer: Medicare Other | Admitting: Family Medicine

## 2016-10-07 ENCOUNTER — Ambulatory Visit: Payer: Medicare Other | Admitting: Family Medicine

## 2016-10-07 DIAGNOSIS — E782 Mixed hyperlipidemia: Secondary | ICD-10-CM | POA: Diagnosis not present

## 2016-10-07 DIAGNOSIS — E119 Type 2 diabetes mellitus without complications: Secondary | ICD-10-CM | POA: Diagnosis not present

## 2016-10-07 DIAGNOSIS — I10 Essential (primary) hypertension: Secondary | ICD-10-CM | POA: Diagnosis not present

## 2016-10-07 DIAGNOSIS — Z6841 Body Mass Index (BMI) 40.0 and over, adult: Secondary | ICD-10-CM | POA: Diagnosis not present

## 2016-10-07 DIAGNOSIS — G4733 Obstructive sleep apnea (adult) (pediatric): Secondary | ICD-10-CM | POA: Diagnosis not present

## 2016-10-07 DIAGNOSIS — R413 Other amnesia: Secondary | ICD-10-CM | POA: Diagnosis not present

## 2016-10-07 DIAGNOSIS — Z23 Encounter for immunization: Secondary | ICD-10-CM | POA: Diagnosis not present

## 2016-10-14 ENCOUNTER — Other Ambulatory Visit: Payer: Self-pay | Admitting: Family Medicine

## 2016-10-16 DIAGNOSIS — G4733 Obstructive sleep apnea (adult) (pediatric): Secondary | ICD-10-CM | POA: Diagnosis not present

## 2016-10-17 DIAGNOSIS — E119 Type 2 diabetes mellitus without complications: Secondary | ICD-10-CM | POA: Diagnosis not present

## 2016-10-21 ENCOUNTER — Encounter: Payer: Self-pay | Admitting: Family Medicine

## 2016-10-28 ENCOUNTER — Encounter: Payer: Self-pay | Admitting: Family Medicine

## 2016-10-28 ENCOUNTER — Ambulatory Visit (INDEPENDENT_AMBULATORY_CARE_PROVIDER_SITE_OTHER): Payer: Medicare Other | Admitting: Family Medicine

## 2016-10-28 VITALS — BP 110/48 | HR 76 | Temp 98.6°F | Resp 16 | Ht 72.0 in | Wt 293.0 lb

## 2016-10-28 DIAGNOSIS — N138 Other obstructive and reflux uropathy: Secondary | ICD-10-CM | POA: Diagnosis not present

## 2016-10-28 DIAGNOSIS — G4733 Obstructive sleep apnea (adult) (pediatric): Secondary | ICD-10-CM | POA: Diagnosis not present

## 2016-10-28 DIAGNOSIS — I10 Essential (primary) hypertension: Secondary | ICD-10-CM | POA: Diagnosis not present

## 2016-10-28 DIAGNOSIS — E1140 Type 2 diabetes mellitus with diabetic neuropathy, unspecified: Secondary | ICD-10-CM | POA: Diagnosis not present

## 2016-10-28 DIAGNOSIS — G4710 Hypersomnia, unspecified: Secondary | ICD-10-CM | POA: Diagnosis not present

## 2016-10-28 DIAGNOSIS — Z794 Long term (current) use of insulin: Secondary | ICD-10-CM | POA: Diagnosis not present

## 2016-10-28 DIAGNOSIS — N401 Enlarged prostate with lower urinary tract symptoms: Secondary | ICD-10-CM

## 2016-10-28 LAB — POCT UA - MICROALBUMIN: Microalbumin Ur, POC: 50 mg/L

## 2016-10-28 MED ORDER — VITAMIN D (ERGOCALCIFEROL) 1.25 MG (50000 UNIT) PO CAPS: ORAL_CAPSULE | ORAL | 11 refills | Status: DC

## 2016-10-28 MED ORDER — MUPIROCIN 2 % EX OINT: 1.0000 "application " | TOPICAL_OINTMENT | Freq: Two times a day (BID) | CUTANEOUS | 0 refills | Status: DC

## 2016-10-28 MED ORDER — TRIAMTERENE-HCTZ 37.5-25 MG PO CAPS: ORAL_CAPSULE | ORAL | 5 refills | Status: DC

## 2016-10-28 MED ORDER — TAMSULOSIN HCL 0.4 MG PO CAPS: 0.4000 mg | ORAL_CAPSULE | Freq: Every day | ORAL | 6 refills | Status: DC

## 2016-10-28 MED ORDER — PREGABALIN 150 MG PO CAPS: 150.0000 mg | ORAL_CAPSULE | Freq: Two times a day (BID) | ORAL | 3 refills | Status: DC

## 2016-10-28 NOTE — Progress Notes (Signed)
Patient: ANKUR RABBITT Male    DOB: Jul 13, 1941   75 y.o.   MRN: [id number] Visit Date: 10/28/2016  Today's Provider: Lelon Huh, MD   Chief Complaint  Patient presents with  . Follow-up  . Diabetes   Subjective:    HPI   Diabetes Mellitus Type II, Follow-up:   Lab Results  Component Value Date   HGBA1C 7.3 09/05/2015   HGBA1C 8.6 05/31/2015   HGBA1C 8.9 04/09/2015   Last seen for diabetes 1 years ago.  Management since then includes; Dr. Sharlynn Oliphant started patient on Humalin 24 nightly. Patient's last HgbA1c was 11.3. He reports good compliance with treatment. He is not having side effects. none Current symptoms include none and have been unchanged. Home blood sugar records: fasting range: 277  Episodes of hypoglycemia? no   Current Insulin Regimen: 24 units of Humalin nightly Most Recent Eye Exam: 06/24/16 Weight trend: fluctuating a bit Prior visit with dietician: no Current diet: well balanced Current exercise: gym  ----------------------------------------------------------------   Sleepiness He had sleep study done in May order from [facility] finding sleep apnea and was started on CPAP. However his daughter today reports he had not been using it consistently until the last couple of weeks. She states he doses off easily throughout the day even since using CPAP. Patient states he feels fine.   Wt Readings from Last 3 Encounters:  10/28/16 293 lb (132.9 kg)  11/02/15 (!) 311 lb (141.1 kg)  09/05/15 (!) 305 lb (138.3 kg)    He also has history of diabetic neuropathy on Lyrica and reports it continues to work well.    No Known Allergies   Current Outpatient Prescriptions:  .  celecoxib (CELEBREX) 200 MG capsule, Take 1 capsule by mouth daily., Disp: , Rfl:  .  donepezil (ARICEPT) 10 MG tablet, , Disp: , Rfl: 2 .  glipiZIDE-metformin (METAGLIP) 2.5-500 MG tablet, Take 1 tablet by mouth 2 (two) times daily before a meal. 1 Tablet 2 Times daily With Meals,  Disp: 180 tablet, Rfl: 4 .  glucose blood test strip, 1 strip by Other route daily., Disp: , Rfl:  .  insulin NPH Human (HUMULIN N,NOVOLIN N) 100 UNIT/ML injection, Inject 20 Units into the skin every evening., Disp: , Rfl:  .  IRON PO, Take 1 capsule by mouth daily., Disp: , Rfl:  .  Liraglutide (VICTOZA) 18 MG/3ML SOPN, Inject 1.8mg  daily.  PATIENT NEEDS TO SCHEDULE OFFICE VISIT FOR FOLLOW UP, Disp: 9 mL, Rfl: 0 .  lisinopril (PRINIVIL,ZESTRIL) 10 MG tablet, Take 1 tablet (10 mg total) by mouth daily. PATIENT NEEDS TO SCHEDULE OFFICE VISIT FOR FOLLOW UP, Disp: 30 tablet, Rfl: 0 .  LYRICA 150 MG capsule, TAKE ONE CAPSULE TWICE A DAY, Disp: 180 capsule, Rfl: 5 .  metFORMIN (GLUCOPHAGE) 1000 MG tablet, Take 1 tablet by mouth 2 (two) times daily., Disp: , Rfl:  .  NIFEdipine (PROCARDIA-XL/ADALAT CC) 60 MG 24 hr tablet, Take 1 tablet by mouth daily., Disp: , Rfl:  .  Phosphatidylserine-DHA-EPA 100-19.5-6.5 MG CAPS, Take by mouth., Disp: , Rfl:  .  Potassium 99 MG TABS, Take 1 tablet by mouth daily., Disp: , Rfl:  .  simvastatin (ZOCOR) 20 MG tablet, One tablet every evening. PATIENT NEEDS TO SCHEDULE OFFICE VISIT FOR FOLLOW UP, Disp: 30 tablet, Rfl: 0 .  simvastatin (ZOCOR) 40 MG tablet, Take 1 tablet by mouth daily., Disp: , Rfl:  .  tamsulosin (FLOMAX) 0.4 MG CAPS capsule, TAKE ONE  CAPSULE DAILY, Disp: 30 capsule, Rfl: 5 .  triamterene-hydrochlorothiazide (DYAZIDE) 37.5-25 MG capsule, TAKE ONE CAPSULE DAILY USUALLY IN THE MORNING, Disp: 30 capsule, Rfl: 1 .  Vitamin D, Ergocalciferol, (DRISDOL) 50000 units CAPS capsule, TAKE ONE CAPSULE ONCE A WEEK, Disp: 4 capsule, Rfl: 0  Review of Systems  Constitutional: Negative for appetite change, chills and fever.  Respiratory: Negative for chest tightness, shortness of breath and wheezing.   Cardiovascular: Negative for chest pain and palpitations.  Gastrointestinal: Negative for abdominal pain, nausea and vomiting.    Social History  Substance Use  Topics  . Smoking status: Former Smoker    Packs/day: 2.00    Years: 30.00    Types: Cigarettes  . Smokeless tobacco: Not on file  . Alcohol use 0.0 oz/week     Comment: occasionally   Objective:   BP (!) 110/48 (BP Location: Right Arm, Patient Position: Sitting, Cuff Size: Large)   Pulse 76   Temp 98.6 F (37 C) (Oral)   Resp 16   Ht 6' (1.829 m)   Wt 293 lb (132.9 kg)   BMI 39.74 kg/m   Physical Exam  General Appearance:    Alert, cooperative, no distress, obese  Eyes:    PERRL, conjunctiva/corneas clear, EOM's intact       Lungs:     Clear to auscultation bilaterally, respirations unlabored  Heart:    Regular rate and rhythm  Neurologic:   Awake, alert, oriented x 3. No apparent focal neurological           defect.           Assessment & Plan:     1. Hypersomnia Discussed Ddx including uncontrolled sleep apnea, medications, or narcolepsy. Will order overnight oximetry to make sure CPAP is effective. Consider reducing Lyrica or Aricept. Consider neurology referral.   2. BPH with obstruction/lower urinary tract symptoms Doing well on tamsulosin which he requestw refill for .  - tamsulosin (FLOMAX) 0.4 MG CAPS capsule; Take 1 capsule (0.4 mg total) by mouth daily.  Dispense: 30 capsule; Refill: 6 - POCT UA - Microalbumin  3. Essential hypertension Well controlled.  Continue current medications.   - triamterene-hydrochlorothiazide (DYAZIDE) 37.5-25 MG capsule; TAKE ONE CAPSULE DAILY USUALLY IN THE MORNING  Dispense: 30 capsule; Refill: 5  4. Type 2 diabetes mellitus with diabetic neuropathy, with long-term current use of insulin ([place]) Doing well with Lyrica for neuropathy. Continue follow up at [facility] for diabetes.  - pregabalin (LYRICA) 150 MG capsule; Take 1 capsule (150 mg total) by mouth 2 (two) times daily.  Dispense: 180 capsule; Refill: 3       Lelon Huh, MD  [facility]

## 2016-11-11 DIAGNOSIS — R413 Other amnesia: Secondary | ICD-10-CM | POA: Diagnosis not present

## 2016-11-11 DIAGNOSIS — E1140 Type 2 diabetes mellitus with diabetic neuropathy, unspecified: Secondary | ICD-10-CM | POA: Diagnosis not present

## 2016-11-11 DIAGNOSIS — Z794 Long term (current) use of insulin: Secondary | ICD-10-CM | POA: Diagnosis not present

## 2016-11-11 DIAGNOSIS — G4733 Obstructive sleep apnea (adult) (pediatric): Secondary | ICD-10-CM | POA: Diagnosis not present

## 2016-11-11 DIAGNOSIS — I10 Essential (primary) hypertension: Secondary | ICD-10-CM | POA: Diagnosis not present

## 2016-11-20 DIAGNOSIS — L872 Elastosis perforans serpiginosa: Secondary | ICD-10-CM | POA: Diagnosis not present

## 2016-11-20 DIAGNOSIS — L309 Dermatitis, unspecified: Secondary | ICD-10-CM | POA: Diagnosis not present

## 2016-11-24 ENCOUNTER — Ambulatory Visit (INDEPENDENT_AMBULATORY_CARE_PROVIDER_SITE_OTHER): Payer: Medicare Other | Admitting: Vascular Surgery

## 2016-11-24 ENCOUNTER — Encounter (INDEPENDENT_AMBULATORY_CARE_PROVIDER_SITE_OTHER): Payer: Self-pay | Admitting: Vascular Surgery

## 2016-11-24 VITALS — BP 137/64 | HR 78 | Resp 16 | Ht 72.0 in | Wt 296.0 lb

## 2016-11-24 DIAGNOSIS — I890 Lymphedema, not elsewhere classified: Secondary | ICD-10-CM | POA: Insufficient documentation

## 2016-11-24 DIAGNOSIS — Z794 Long term (current) use of insulin: Secondary | ICD-10-CM

## 2016-11-24 DIAGNOSIS — E119 Type 2 diabetes mellitus without complications: Secondary | ICD-10-CM

## 2016-11-24 DIAGNOSIS — I739 Peripheral vascular disease, unspecified: Secondary | ICD-10-CM | POA: Diagnosis not present

## 2016-11-24 DIAGNOSIS — L97329 Non-pressure chronic ulcer of left ankle with unspecified severity: Secondary | ICD-10-CM | POA: Insufficient documentation

## 2016-11-24 DIAGNOSIS — I83023 Varicose veins of left lower extremity with ulcer of ankle: Secondary | ICD-10-CM | POA: Diagnosis not present

## 2016-11-24 DIAGNOSIS — I872 Venous insufficiency (chronic) (peripheral): Secondary | ICD-10-CM

## 2016-11-24 DIAGNOSIS — I10 Essential (primary) hypertension: Secondary | ICD-10-CM

## 2016-11-24 NOTE — Progress Notes (Signed)
MRN : [id number]  Norman Palmer is a 75 y.o. (1941-08-19) male who presents with chief complaint of  Chief Complaint  Patient presents with  . New Evaluation    Left Leg Ulceration  .  History of Present Illness: Patient is seen for evaluation of leg pain and swelling associated with new onset ulceration. The patient first noticed the swelling remotely. The swelling is associated with pain and discoloration. The pain and swelling worsens with prolonged dependency and improves with elevation. The pain is unrelated to activity.  The patient notes that in the morning the legs are better but the leg symptoms worsened throughout the course of the day. The patient has also noted a progressive worsening of the discoloration in the ankle and shin area.   The patient notes that an ulcer has developed subacutely without specific trauma and since it occurred it has been very slow to heal.  This has been ulcerated several times in the past.  The wound is also very painful.  The patient denies claudication symptoms or rest pain symptoms.  The patient denies DJD and LS spine disease.  The patient has not had any past angiography, interventions or vascular surgery.  Elevation makes the leg symptoms better, dependency makes them much worse. The patient denies any recent changes in medications.  The patient has not been wearing graduated compression.  The patient denies a history of DVT or PE. There is no prior history of phlebitis. There is no history of primary lymphedema.  No history of malignancies. No history of trauma or groin or pelvic surgery. There is no history of radiation treatment to the groin or pelvis       Current Meds  Medication Sig  . celecoxib (CELEBREX) 200 MG capsule Take 1 capsule by mouth daily.  Marland Kitchen donepezil (ARICEPT) 10 MG tablet   . glipiZIDE-metformin (METAGLIP) 2.5-500 MG tablet Take 1 tablet by mouth 2 (two) times daily before a meal. 1 Tablet 2 Times daily With  Meals  . glucose blood test strip 1 strip by Other route daily.  . insulin NPH Human (HUMULIN N,NOVOLIN N) 100 UNIT/ML injection Inject 20 Units into the skin every evening.  . IRON PO Take 1 capsule by mouth daily.  . Liraglutide (VICTOZA) 18 MG/3ML SOPN Inject 1.8mg  daily.  PATIENT NEEDS TO SCHEDULE OFFICE VISIT FOR FOLLOW UP  . lisinopril (PRINIVIL,ZESTRIL) 10 MG tablet Take 1 tablet (10 mg total) by mouth daily. PATIENT NEEDS TO SCHEDULE OFFICE VISIT FOR FOLLOW UP  . metFORMIN (GLUCOPHAGE) 1000 MG tablet Take 1 tablet by mouth 2 (two) times daily.  . mupirocin ointment (BACTROBAN) 2 % Place 1 application into the nose 2 (two) times daily.  Marland Kitchen NIFEdipine (PROCARDIA-XL/ADALAT CC) 60 MG 24 hr tablet Take 1 tablet by mouth daily.  . Phosphatidylserine-DHA-EPA 100-19.5-6.5 MG CAPS Take by mouth.  . Potassium 99 MG TABS Take 1 tablet by mouth daily.  . pregabalin (LYRICA) 150 MG capsule Take 1 capsule (150 mg total) by mouth 2 (two) times daily.  . simvastatin (ZOCOR) 20 MG tablet One tablet every evening. PATIENT NEEDS TO SCHEDULE OFFICE VISIT FOR FOLLOW UP  . simvastatin (ZOCOR) 40 MG tablet Take 1 tablet by mouth daily.  . tamsulosin (FLOMAX) 0.4 MG CAPS capsule Take 1 capsule (0.4 mg total) by mouth daily.  Marland Kitchen triamterene-hydrochlorothiazide (DYAZIDE) 37.5-25 MG capsule TAKE ONE CAPSULE DAILY USUALLY IN THE MORNING  . Vitamin D, Ergocalciferol, (DRISDOL) 50000 units CAPS capsule TAKE ONE CAPSULE ONCE A  WEEK    Past Medical History:  Diagnosis Date  . Diabetes mellitus without complication ([place])   . H/O knee surgery 04/17/2015   Overview:  Bilateral knees surgery   . Hyperlipemia   . Hypersomnia   . Hypertension   . Insomnia   . Lumbago   . Myalgia   . Neuropathy ([place])   . Sciatica   . Thyroid disease     Past Surgical History:  Procedure Laterality Date  . knee surgery Bilateral 2000  . SKIN GRAFT Bilateral 1955   legs    Social History Social History  Substance Use Topics    . Smoking status: Former Smoker    Packs/day: 2.00    Years: 30.00    Types: Cigarettes  . Smokeless tobacco: Not on file  . Alcohol use 0.0 oz/week     Comment: occasionally    Family History Family History  Problem Relation Age of Onset  . Hypertension Father   . Cancer Brother     Liver Cancer  No family history of bleeding/clotting disorders, porphyria or autoimmune disease   No Known Allergies   REVIEW OF SYSTEMS (Negative unless checked)  Constitutional: [] Weight loss  [] Fever  [] Chills Cardiac: [] Chest pain   [] Chest pressure   [] Palpitations   [] Shortness of breath when laying flat   [] Shortness of breath with exertion. Vascular:  [] Pain in legs with walking   [x] Pain in legs at rest  [] History of DVT   [] Phlebitis   [x] Swelling in legs   [x] Varicose veins   [x] Non-healing ulcers Pulmonary:   [] Uses home oxygen   [] Productive cough   [] Hemoptysis   [] Wheeze  [] COPD   [] Asthma Neurologic:  [] Dizziness   [] Seizures   [] History of stroke   [] History of TIA  [] Aphasia   [] Vissual changes   [] Weakness or numbness in arm   [] Weakness or numbness in leg Musculoskeletal:   [] Joint swelling   [] Joint pain   [] Low back pain Hematologic:  [] Easy bruising  [] Easy bleeding   [] Hypercoagulable state   [] Anemic Gastrointestinal:  [] Diarrhea   [] Vomiting  [] Gastroesophageal reflux/heartburn   [] Difficulty swallowing. Genitourinary:  [] Chronic kidney disease   [] Difficult urination  [] Frequent urination   [] Blood in urine Skin:  [] Rashes   [x] Ulcers  Psychological:  [] History of anxiety   []  History of major depression.  Physical Examination  Vitals:   11/24/16 1339  BP: 137/64  Pulse: 78  Resp: 16  Weight: 296 lb (134.3 kg)  Height: 6' (1.829 m)   Body mass index is 40.14 kg/m. Gen: WD/WN, NAD Head: [place]/AT, No temporalis wasting.  Ear/Nose/Throat: Hearing grossly intact, nares w/o erythema or drainage, poor dentition Eyes: PER, EOMI, sclera nonicteric.  Neck: Supple, no  masses.  No bruit or JVD.  Pulmonary:  Good air movement, clear to auscultation bilaterally, no use of accessory muscles.  Cardiac: RRR, normal S1, S2, no Murmurs. Vascular: severe venous stasis changes with atrophy blanch Right >>Left scaring from previously healed  Venous ulcers Vessel Right Left  Radial Palpable Palpable  Ulnar Palpable Palpable  Brachial Palpable Palpable  Carotid Palpable Palpable  Femoral Palpable Palpable  Popliteal Palpable Palpable  PT Palpable Palpable  DP Palpable Palpable   Gastrointestinal: soft, non-distended. No guarding/no peritoneal signs.  Musculoskeletal: M/S 5/5 throughout.  No deformity or atrophy.  Neurologic: CN 2-12 intact. Pain and light touch intact in extremities.  Symmetrical.  Speech is fluent. Motor exam as listed above. Psychiatric: Judgment intact, Mood & affect appropriate for pt's  clinical situation. Dermatologic: bilateral rashes noted right venous ulcers noted 1 x 1 cm.  No changes consistent with cellulitis. Lymph : No Cervical lymphadenopathy, no lichenification or skin changes of chronic lymphedema.  CBC Lab Results  Component Value Date   WBC 8.5 10/11/2014   HGB 15.0 10/11/2014   HCT 45.4 10/11/2014   PLT 249 10/11/2014    BMET    Component Value Date/Time   NA 139 05/02/2015   K 4.3 05/02/2015   BUN 17 05/02/2015   CREATININE 0.90 05/02/2015   CrCl cannot be calculated (Patient's most recent lab result is older than the maximum 21 days allowed.).  COAG No results found for: INR, PROTIME  Radiology No results found.  Assessment/Plan 1. Venous ulcer of ankle, left ([facility]) No surgery or intervention at this point in time.    I have had a long discussion with the patient regarding venous insufficiency and why it  causes symptoms, specifically venous ulceration . I have discussed with the patient the chronic skin changes that accompany venous insufficiency and the long term sequela such as infection and recurring   ulceration.  He will begin graduated compression.  In addition, behavioral modification including several periods of elevation of the lower extremities during the day will be continued. Achieving a position with the ankles at heart level was stressed to the patient  The patient is instructed to begin routine exercise, especially walking on a daily basis  Patient should undergo duplex ultrasound of the venous system to ensure that DVT or reflux is not present.  Following the review of the ultrasound the patient will follow up in several weeks to reassess the degree of swelling and the control that compression is providing      2. Chronic venous insufficiency See #1  3. Lymphedema See #1  4. Essential hypertension Continue antihypertensive medications as already ordered and reviewed, no changes at this time.  Continue statin as ordered and reviewed, no changes at this time  5. 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[place]) Continue hypoglycemic medications as already ordered and reviewed, no changes at this time.       Hortencia Pilar, MD  11/24/2016 9:41 PM

## 2016-12-01 ENCOUNTER — Encounter (INDEPENDENT_AMBULATORY_CARE_PROVIDER_SITE_OTHER): Payer: Medicare Other

## 2016-12-01 ENCOUNTER — Ambulatory Visit (INDEPENDENT_AMBULATORY_CARE_PROVIDER_SITE_OTHER): Payer: Medicare Other | Admitting: Vascular Surgery

## 2016-12-01 ENCOUNTER — Encounter (INDEPENDENT_AMBULATORY_CARE_PROVIDER_SITE_OTHER): Payer: Self-pay | Admitting: Vascular Surgery

## 2016-12-01 VITALS — BP 142/74 | HR 71 | Resp 16 | Ht 72.0 in | Wt 296.0 lb

## 2016-12-01 DIAGNOSIS — I83023 Varicose veins of left lower extremity with ulcer of ankle: Secondary | ICD-10-CM

## 2016-12-01 DIAGNOSIS — L97329 Non-pressure chronic ulcer of left ankle with unspecified severity: Principal | ICD-10-CM

## 2016-12-01 NOTE — Progress Notes (Signed)
History of Present Illness  There is no documented history at this time  Assessments & Plan   There are no diagnoses linked to this encounter.    Additional instructions  Subjective:  Patient presents with venous ulcer of the Left lower extremity.    Procedure:  3 layer unna wrap was placed Left lower extremity.   Plan:   Follow up in one week.  

## 2016-12-02 ENCOUNTER — Encounter (INDEPENDENT_AMBULATORY_CARE_PROVIDER_SITE_OTHER): Payer: Self-pay | Admitting: Vascular Surgery

## 2016-12-02 ENCOUNTER — Ambulatory Visit (INDEPENDENT_AMBULATORY_CARE_PROVIDER_SITE_OTHER): Payer: Medicare Other | Admitting: Vascular Surgery

## 2016-12-02 DIAGNOSIS — I890 Lymphedema, not elsewhere classified: Secondary | ICD-10-CM

## 2016-12-02 NOTE — Progress Notes (Signed)
Patient refused unna boot

## 2016-12-08 ENCOUNTER — Encounter (INDEPENDENT_AMBULATORY_CARE_PROVIDER_SITE_OTHER): Payer: Self-pay

## 2016-12-08 ENCOUNTER — Ambulatory Visit (INDEPENDENT_AMBULATORY_CARE_PROVIDER_SITE_OTHER): Payer: Medicare Other | Admitting: Nurse Practitioner

## 2016-12-08 NOTE — Progress Notes (Unsigned)
Patient refused to get the unna wrap

## 2016-12-16 ENCOUNTER — Encounter (INDEPENDENT_AMBULATORY_CARE_PROVIDER_SITE_OTHER): Payer: Medicare Other

## 2016-12-16 ENCOUNTER — Other Ambulatory Visit: Payer: Self-pay | Admitting: Family Medicine

## 2016-12-16 DIAGNOSIS — I10 Essential (primary) hypertension: Secondary | ICD-10-CM

## 2016-12-25 ENCOUNTER — Encounter (INDEPENDENT_AMBULATORY_CARE_PROVIDER_SITE_OTHER): Payer: Self-pay | Admitting: Vascular Surgery

## 2016-12-25 ENCOUNTER — Ambulatory Visit (INDEPENDENT_AMBULATORY_CARE_PROVIDER_SITE_OTHER): Payer: Medicare Other | Admitting: Vascular Surgery

## 2016-12-25 VITALS — BP 95/49 | HR 80 | Resp 16 | Ht 72.0 in | Wt 295.0 lb

## 2016-12-25 DIAGNOSIS — I890 Lymphedema, not elsewhere classified: Secondary | ICD-10-CM | POA: Diagnosis not present

## 2016-12-25 DIAGNOSIS — L97329 Non-pressure chronic ulcer of left ankle with unspecified severity: Secondary | ICD-10-CM | POA: Insufficient documentation

## 2016-12-25 DIAGNOSIS — I872 Venous insufficiency (chronic) (peripheral): Secondary | ICD-10-CM | POA: Diagnosis not present

## 2016-12-25 DIAGNOSIS — I739 Peripheral vascular disease, unspecified: Secondary | ICD-10-CM | POA: Diagnosis not present

## 2016-12-25 DIAGNOSIS — I10 Essential (primary) hypertension: Secondary | ICD-10-CM

## 2016-12-25 DIAGNOSIS — E782 Mixed hyperlipidemia: Secondary | ICD-10-CM | POA: Diagnosis not present

## 2016-12-25 DIAGNOSIS — L97322 Non-pressure chronic ulcer of left ankle with fat layer exposed: Secondary | ICD-10-CM | POA: Diagnosis not present

## 2016-12-25 NOTE — Progress Notes (Signed)
MRN : [id number]  Norman Palmer is a 76 y.o. (03-31-1941) male who presents with chief complaint of  Chief Complaint  Patient presents with  . Re-evaluation    Discuss wound access  .  History of Present Illness: Patient is seen in follow up for leg pain and swelling associated with left ankle ulceration. The patient first noticed the swelling remotely. The swelling is associated with pain and discoloration. The pain and swelling worsens with prolonged dependency and improves with elevation. The pain is unrelated to activity.  He has not tolerated his Unna wraps and cut them off.  In the past he has not been able to wear compression.  The patient notes that an ulcer has not improved.  The wound is also very painful.  At this time the patient does say he has claudication symptoms but denies rest pain symptoms.  The patient denies DJD and LS spine disease.  The patient has not had any past angiography, interventions or vascular surgery.  Elevation makes the leg symptoms better, dependency makes them much worse. The patient denies any recent changes in medications.  The patient has not been wearing graduated compression.  The patient denies a history of DVT or PE. There is no prior history of phlebitis. There is no history of primary lymphedema.  No history of malignancies. No history of trauma or groin or pelvic surgery. There is no history of radiation treatment to the groin or pelvis       Current Meds  Medication Sig  . celecoxib (CELEBREX) 200 MG capsule Take 1 capsule by mouth daily.  Marland Kitchen donepezil (ARICEPT) 10 MG tablet   . glipiZIDE-metformin (METAGLIP) 2.5-500 MG tablet Take 1 tablet by mouth 2 (two) times daily before a meal. 1 Tablet 2 Times daily With Meals  . glucose blood test strip 1 strip by Other route daily.  . insulin NPH Human (HUMULIN N,NOVOLIN N) 100 UNIT/ML injection Inject 20 Units into the skin every evening.  . IRON PO Take 1 capsule by mouth daily.    . Liraglutide (VICTOZA) 18 MG/3ML SOPN Inject 1.8mg  daily.  PATIENT NEEDS TO SCHEDULE OFFICE VISIT FOR FOLLOW UP  . lisinopril (PRINIVIL,ZESTRIL) 20 MG tablet Take 1 tablet (20 mg total) by mouth daily.  . metFORMIN (GLUCOPHAGE) 1000 MG tablet Take 1 tablet by mouth 2 (two) times daily.  . mupirocin ointment (BACTROBAN) 2 % Place 1 application into the nose 2 (two) times daily.  Marland Kitchen NIFEdipine (PROCARDIA-XL/ADALAT CC) 60 MG 24 hr tablet Take 1 tablet by mouth daily.  . Phosphatidylserine-DHA-EPA 100-19.5-6.5 MG CAPS Take by mouth.  . Potassium 99 MG TABS Take 1 tablet by mouth daily.  . pregabalin (LYRICA) 150 MG capsule Take 1 capsule (150 mg total) by mouth 2 (two) times daily.  . simvastatin (ZOCOR) 20 MG tablet One tablet every evening. PATIENT NEEDS TO SCHEDULE OFFICE VISIT FOR FOLLOW UP  . simvastatin (ZOCOR) 40 MG tablet Take 1 tablet by mouth daily.  . tamsulosin (FLOMAX) 0.4 MG CAPS capsule Take 1 capsule (0.4 mg total) by mouth daily.  Marland Kitchen triamterene-hydrochlorothiazide (DYAZIDE) 37.5-25 MG capsule TAKE ONE CAPSULE DAILY USUALLY IN THE MORNING  . Vitamin D, Ergocalciferol, (DRISDOL) 50000 units CAPS capsule TAKE ONE CAPSULE ONCE A WEEK    Past Medical History:  Diagnosis Date  . Diabetes mellitus without complication ([place])   . H/O knee surgery 04/17/2015   Overview:  Bilateral knees surgery   . Hyperlipemia   . Hypersomnia   .  Hypertension   . Insomnia   . Lumbago   . Myalgia   . Neuropathy ([place])   . Sciatica   . Thyroid disease     Past Surgical History:  Procedure Laterality Date  . knee surgery Bilateral 2000  . SKIN GRAFT Bilateral 1955   legs    Social History Social History  Substance Use Topics  . Smoking status: Former Smoker    Packs/day: 2.00    Years: 30.00    Types: Cigarettes  . Smokeless tobacco: Never Used  . Alcohol use 0.0 oz/week     Comment: occasionally    Family History Family History  Problem Relation Age of Onset  . Hypertension  Father   . Cancer Brother     Liver Cancer  No family history of bleeding/clotting disorders, porphyria or autoimmune disease   No Known Allergies   REVIEW OF SYSTEMS (Negative unless checked)  Constitutional: [] Weight loss  [] Fever  [] Chills Cardiac: [] Chest pain   [] Chest pressure   [] Palpitations   [] Shortness of breath when laying flat   [] Shortness of breath with exertion. Vascular:  [] Pain in legs with walking   [] Pain in legs at rest  [] History of DVT   [] Phlebitis   [] Swelling in legs   [] Varicose veins   [] Non-healing ulcers Pulmonary:   [] Uses home oxygen   [] Productive cough   [] Hemoptysis   [] Wheeze  [] COPD   [] Asthma Neurologic:  [] Dizziness   [] Seizures   [] History of stroke   [] History of TIA  [] Aphasia   [] Vissual changes   [] Weakness or numbness in arm   [] Weakness or numbness in leg Musculoskeletal:   [] Joint swelling   [] Joint pain   [] Low back pain Hematologic:  [] Easy bruising  [] Easy bleeding   [] Hypercoagulable state   [] Anemic Gastrointestinal:  [] Diarrhea   [] Vomiting  [] Gastroesophageal reflux/heartburn   [] Difficulty swallowing. Genitourinary:  [] Chronic kidney disease   [] Difficult urination  [] Frequent urination   [] Blood in urine Skin:  [x] Rashes   [x] Ulcers  Psychological:  [] History of anxiety   []  History of major depression.  Physical Examination  Vitals:   12/25/16 1620  BP: (!) 95/49  Pulse: 80  Resp: 16  Weight: 295 lb (133.8 kg)  Height: 6' (1.829 m)   Body mass index is 40.01 kg/m. Gen: WD/WN, NAD Head: [place]/AT, No temporalis wasting.  Ear/Nose/Throat: Hearing grossly intact, nares w/o erythema or drainage, poor dentition Eyes: PER, EOMI, sclera nonicteric.  Neck: Supple, no masses.  No bruit or JVD.  Pulmonary:  Good air movement, clear to auscultation bilaterally, no use of accessory muscles.  Cardiac: RRR, normal S1, S2, no Murmurs. Vascular:  Ulceration left ankle with some granulation no infection, 2-3+ edema bilaterally; moderate  venous stasis dermatitis Vessel Right Left  Radial Palpable Palpable  Ulnar Palpable Palpable  Brachial Palpable Palpable  Carotid Palpable Palpable  Femoral Palpable Palpable  Popliteal Palpable Palpable  PT Trace Palpable Trace Palpable  DP Trace Palpable Trace Palpable   Gastrointestinal: soft, non-distended. No guarding/no peritoneal signs.  Musculoskeletal: M/S 5/5 throughout.  No deformity or atrophy.  Neurologic: CN 2-12 intact. Pain and light touch intact in extremities.  Symmetrical.  Speech is fluent. Motor exam as listed above. Psychiatric: Judgment intact, Mood & affect appropriate for pt's clinical situation. Dermatologic: No rashes or ulcers noted.  No changes consistent with cellulitis. Lymph : No Cervical lymphadenopathy, no lichenification or skin changes of chronic lymphedema.  CBC Lab Results  Component Value Date   WBC 8.5  10/11/2014   HGB 15.0 10/11/2014   HCT 45.4 10/11/2014   PLT 249 10/11/2014    BMET    Component Value Date/Time   NA 139 05/02/2015   K 4.3 05/02/2015   BUN 17 05/02/2015   CREATININE 0.90 05/02/2015   CrCl cannot be calculated (Patient's most recent lab result is older than the maximum 21 days allowed.).  COAG No results found for: INR, PROTIME  Radiology No results found.  Assessment/Plan 1. Skin ulcer of left ankle with fat layer exposed ([place]) No surgery or intervention at this point in time.    I have had a long discussion with the patient regarding venous insufficiency and why it  causes symptoms, specifically venous ulceration . I have discussed with the patient the chronic skin changes that accompany venous insufficiency and the long term sequela such as infection and recurring  ulceration.  Patient will be placed in compression wraps.  In addition, behavioral modification including several periods of elevation of the lower extremities during the day will be continued. Achieving a position with the ankles at heart level  was stressed to the patient  The patient is instructed to begin routine exercise, especially walking on a daily basis  Patient should undergo ABI's given his c/o claudication symptoms and the ulceration.  Following the review of the ultrasound the patient will follow up in one week to reassess the degree of swelling and the control that Unna therapy is offering.   The patient can be assessed for graduated compression stockings or wraps as well as a Lymph Pump once the ulcers are healed.  - VAS [place] ABI WITH/WO TBI; Future  2. Chronic venous insufficiency No surgery or intervention at this point in time.    I have had a long discussion with the patient regarding venous insufficiency and why it  causes symptoms. I have discussed with the patient the chronic skin changes that accompany venous insufficiency and the long term sequela such as infection and ulceration.  Patient will begin wearing graduated compression stockings class 1 (20-30 mmHg) or compression wraps on a daily basis a prescription was given. The patient will put the stockings on first thing in the morning and removing them in the evening. The patient is instructed specifically not to sleep in the stockings.    In addition, behavioral modification including several periods of elevation of the lower extremities during the day will be continued. I have demonstrated that proper elevation is a position with the ankles at heart level.  The patient is instructed to begin routine exercise, especially walking on a daily basis  Following the review of the ultrasound the patient will follow up in 2-3 months to reassess the degree of swelling and the control that graduated compression stockings or compression wraps  is offering.   The patient can be assessed for a Lymph Pump at that time  3. Lymphedema See #2  4. PAD (peripheral artery disease) ([place]) See #1 - VAS [place] ABI WITH/WO TBI; Future  5. Essential hypertension Continue  antihypertensive medications as already ordered, these medications have been reviewed and there are no changes at this time.  6. Mixed hyperlipidemia Continue statin as ordered and reviewed, no changes at this time     Hortencia Pilar, MD  12/25/2016 5:26 PM

## 2017-01-12 ENCOUNTER — Ambulatory Visit (INDEPENDENT_AMBULATORY_CARE_PROVIDER_SITE_OTHER): Payer: Medicare Other | Admitting: Vascular Surgery

## 2017-01-12 ENCOUNTER — Ambulatory Visit (INDEPENDENT_AMBULATORY_CARE_PROVIDER_SITE_OTHER): Payer: Medicare Other

## 2017-01-12 ENCOUNTER — Encounter (INDEPENDENT_AMBULATORY_CARE_PROVIDER_SITE_OTHER): Payer: Self-pay | Admitting: Vascular Surgery

## 2017-01-12 VITALS — BP 148/66 | HR 88 | Resp 14 | Wt 296.0 lb

## 2017-01-12 DIAGNOSIS — I739 Peripheral vascular disease, unspecified: Secondary | ICD-10-CM

## 2017-01-12 DIAGNOSIS — L97322 Non-pressure chronic ulcer of left ankle with fat layer exposed: Secondary | ICD-10-CM | POA: Diagnosis not present

## 2017-01-12 DIAGNOSIS — L97329 Non-pressure chronic ulcer of left ankle with unspecified severity: Secondary | ICD-10-CM

## 2017-01-12 DIAGNOSIS — I83023 Varicose veins of left lower extremity with ulcer of ankle: Secondary | ICD-10-CM | POA: Diagnosis not present

## 2017-01-12 DIAGNOSIS — I872 Venous insufficiency (chronic) (peripheral): Secondary | ICD-10-CM

## 2017-01-12 DIAGNOSIS — I890 Lymphedema, not elsewhere classified: Secondary | ICD-10-CM

## 2017-01-12 MED ORDER — DOXYCYCLINE HYCLATE 100 MG PO CAPS: 100.0000 mg | ORAL_CAPSULE | Freq: Two times a day (BID) | ORAL | 0 refills | Status: DC

## 2017-01-12 NOTE — Progress Notes (Signed)
MRN : [id number]  Norman Palmer is a 75 y.o. (1941-12-10) male who presents with chief complaint of  Chief Complaint  Patient presents with  . Follow-up  .  History of Present Illness: The patient returns to the office for followup evaluation regarding leg swelling.  The swelling has persisted and the pain associated with swelling is worse. There has been interval worsening of his ulcerations or wounds.  Since the previous visit the patient has been wearing graduated compression stockings (he has purchased a Sigvaris compression wrap) and has noted little if any improvement in the lymphedema. The patient has been using compression routinely morning until night.  The patient also states elevation during the day and exercise is being done too.   Current Meds  Medication Sig  . celecoxib (CELEBREX) 200 MG capsule Take 1 capsule by mouth daily.  Marland Kitchen donepezil (ARICEPT) 10 MG tablet   . glipiZIDE-metformin (METAGLIP) 2.5-500 MG tablet Take 1 tablet by mouth 2 (two) times daily before a meal. 1 Tablet 2 Times daily With Meals  . glucose blood test strip 1 strip by Other route daily.  . insulin NPH Human (HUMULIN N,NOVOLIN N) 100 UNIT/ML injection Inject 20 Units into the skin every evening.  . IRON PO Take 1 capsule by mouth daily.  Marland Kitchen lisinopril (PRINIVIL,ZESTRIL) 20 MG tablet Take 1 tablet (20 mg total) by mouth daily.  . metFORMIN (GLUCOPHAGE) 1000 MG tablet Take 1 tablet by mouth 2 (two) times daily.  . mupirocin ointment (BACTROBAN) 2 % Place 1 application into the nose 2 (two) times daily.  Marland Kitchen NIFEdipine (PROCARDIA-XL/ADALAT CC) 60 MG 24 hr tablet Take 1 tablet by mouth daily.  . Phosphatidylserine-DHA-EPA 100-19.5-6.5 MG CAPS Take by mouth.  . Potassium 99 MG TABS Take 1 tablet by mouth daily.  . pregabalin (LYRICA) 150 MG capsule Take 1 capsule (150 mg total) by mouth 2 (two) times daily.  . simvastatin (ZOCOR) 20 MG tablet One tablet every evening. PATIENT NEEDS TO SCHEDULE  OFFICE VISIT FOR FOLLOW UP  . simvastatin (ZOCOR) 40 MG tablet Take 1 tablet by mouth daily.  . tamsulosin (FLOMAX) 0.4 MG CAPS capsule Take 1 capsule (0.4 mg total) by mouth daily.  Marland Kitchen triamterene-hydrochlorothiazide (DYAZIDE) 37.5-25 MG capsule TAKE ONE CAPSULE DAILY USUALLY IN THE MORNING  . Vitamin D, Ergocalciferol, (DRISDOL) 50000 units CAPS capsule TAKE ONE CAPSULE ONCE A WEEK    Past Medical History:  Diagnosis Date  . Diabetes mellitus without complication ([place])   . H/O knee surgery 04/17/2015   Overview:  Bilateral knees surgery   . Hyperlipemia   . Hypersomnia   . Hypertension   . Insomnia   . Lumbago   . Myalgia   . Neuropathy ([place])   . Sciatica   . Thyroid disease     Past Surgical History:  Procedure Laterality Date  . knee surgery Bilateral 2000  . SKIN GRAFT Bilateral 1955   legs    Social History Social History  Substance Use Topics  . Smoking status: Former Smoker    Packs/day: 2.00    Years: 30.00    Types: Cigarettes  . Smokeless tobacco: Never Used  . Alcohol use 0.0 oz/week     Comment: occasionally    Family History Family History  Problem Relation Age of Onset  . Hypertension Father   . Cancer Brother     Liver Cancer  No family history of bleeding/clotting disorders, porphyria or autoimmune disease   No Known Allergies  REVIEW OF SYSTEMS (Negative unless checked)  Constitutional: [] Weight loss  [] Fever  [] Chills Cardiac: [] Chest pain   [] Chest pressure   [] Palpitations   [] Shortness of breath when laying flat   [] Shortness of breath with exertion. Vascular:  [] Pain in legs with walking   [x] Pain in legs at rest  [] History of DVT   [] Phlebitis   [x] Swelling in legs   [] Varicose veins   [x] Non-healing ulcers Pulmonary:   [] Uses home oxygen   [] Productive cough   [] Hemoptysis   [] Wheeze  [] COPD   [] Asthma Neurologic:  [] Dizziness   [] Seizures   [] History of stroke   [] History of TIA  [] Aphasia   [] Vissual changes   [] Weakness or  numbness in arm   [] Weakness or numbness in leg Musculoskeletal:   [] Joint swelling   [] Joint pain   [] Low back pain Hematologic:  [] Easy bruising  [] Easy bleeding   [] Hypercoagulable state   [] Anemic Gastrointestinal:  [] Diarrhea   [] Vomiting  [] Gastroesophageal reflux/heartburn   [] Difficulty swallowing. Genitourinary:  [] Chronic kidney disease   [] Difficult urination  [] Frequent urination   [] Blood in urine Skin:  [] Rashes   [] Ulcers  Psychological:  [] History of anxiety   []  History of major depression.  Physical Examination  Vitals:   01/12/17 1149  BP: (!) 148/66  Pulse: 88  Resp: 14  Weight: 296 lb (134.3 kg)   Body mass index is 40.14 kg/m. Gen: WD/WN, NAD Head: [place]/AT, No temporalis wasting.  Ear/Nose/Throat: Hearing grossly intact, nares w/o erythema or drainage, poor dentition Eyes: PER, EOMI, sclera nonicteric.  Neck: Supple, no masses.  No bruit or JVD.  Pulmonary:  Good air movement, clear to auscultation bilaterally, no use of accessory muscles.  Cardiac: RRR, normal S1, S2, no Murmurs. Vascular: severe venous stasis changes with atrophy blanch Right >>Left scaring from previously healed  Venous ulcers now weeping and there is increased redness and tenderness Vessel Right Left  Radial Palpable Palpable  Ulnar Palpable Palpable  Brachial Palpable Palpable  Carotid Palpable Palpable  Femoral Palpable Palpable  Popliteal Palpable Palpable  PT Palpable Palpable  DP Palpable Palpable   Gastrointestinal: soft, non-distended. No guarding/no peritoneal signs.  Musculoskeletal: M/S 5/5 throughout.  No deformity or atrophy.  Neurologic: CN 2-12 intact. Pain and light touch intact in extremities.  Symmetrical.  Speech is fluent. Motor exam as listed above. Psychiatric: Judgment intact, Mood & affect appropriate for pt's clinical situation. Dermatologic: No rashes or ulcers noted.  No changes consistent with cellulitis. Lymph : No Cervical lymphadenopathy, no  lichenification or skin changes of chronic lymphedema.  CBC Lab Results  Component Value Date   WBC 8.5 10/11/2014   HGB 15.0 10/11/2014   HCT 45.4 10/11/2014   PLT 249 10/11/2014    BMET    Component Value Date/Time   NA 139 05/02/2015   K 4.3 05/02/2015   BUN 17 05/02/2015   CREATININE 0.90 05/02/2015   CrCl cannot be calculated (Patient's most recent lab result is older than the maximum 21 days allowed.).  COAG No results found for: INR, PROTIME  Radiology No results found.  Assessment/Plan 1. Skin ulcer of left ankle with fat layer exposed ([place]) I have reviewed my discussion with the patient regarding venous insufficiency and why it  causes symptoms, specifically venous ulceration.  I have discussed with the patient the chronic skin changes that accompany venous insufficiency and the long term sequela such as infection and recurring  ulceration.  He will continue graduated compression.  He is not interested  in an Haematologist.  Given the increased redness and pain and the weeping I will start him on Doxycycline.  In addition, behavioral modification including several periods of elevation of the lower extremities during the day will be continued. Achieving a position with the ankles at heart level was stressed to the patient   2. Venous ulcer of ankle, left ([place]) See #1  3. Chronic venous insufficiency See #1  4. Lymphedema Compression is stressed and I have again reviewed frequent elevation such as a recliner.    Hortencia Pilar, MD  01/12/2017 1:13 PM

## 2017-01-26 ENCOUNTER — Encounter (INDEPENDENT_AMBULATORY_CARE_PROVIDER_SITE_OTHER): Payer: Self-pay | Admitting: Vascular Surgery

## 2017-01-26 ENCOUNTER — Ambulatory Visit (INDEPENDENT_AMBULATORY_CARE_PROVIDER_SITE_OTHER): Payer: Medicare Other | Admitting: Vascular Surgery

## 2017-01-26 VITALS — BP 120/63 | HR 99 | Resp 16 | Wt 295.0 lb

## 2017-01-26 DIAGNOSIS — I890 Lymphedema, not elsewhere classified: Secondary | ICD-10-CM | POA: Diagnosis not present

## 2017-01-26 DIAGNOSIS — E782 Mixed hyperlipidemia: Secondary | ICD-10-CM | POA: Diagnosis not present

## 2017-01-26 DIAGNOSIS — I10 Essential (primary) hypertension: Secondary | ICD-10-CM

## 2017-01-26 DIAGNOSIS — I83023 Varicose veins of left lower extremity with ulcer of ankle: Secondary | ICD-10-CM | POA: Diagnosis not present

## 2017-01-26 DIAGNOSIS — I872 Venous insufficiency (chronic) (peripheral): Secondary | ICD-10-CM | POA: Diagnosis not present

## 2017-01-26 DIAGNOSIS — L97329 Non-pressure chronic ulcer of left ankle with unspecified severity: Principal | ICD-10-CM

## 2017-01-26 NOTE — Progress Notes (Signed)
MRN : [id number]  Norman Palmer is a 76 y.o. (May 10, 1941) male who presents with chief complaint of  Chief Complaint  Patient presents with  . Follow-up  .  History of Present Illness: The patient again returns to the office for followup evaluation regarding venous ulceration with leg swelling.  The swelling has persisted and the pain associated with swelling is a little improved.  His wife has been doing daily wound care.   Since the previous visit the patient has been wearing graduated compression stockings (he has purchased a Sigvaris compression wrap) and has noted little if any improvement in the lymphedema. The patient has been using compression routinely morning until night.  The patient also states elevation during the day and exercise is being done too.  Current Meds  Medication Sig  . celecoxib (CELEBREX) 200 MG capsule Take 1 capsule by mouth daily.  Marland Kitchen donepezil (ARICEPT) 10 MG tablet   . doxycycline (VIBRAMYCIN) 100 MG capsule Take 1 capsule (100 mg total) by mouth 2 (two) times daily.  Marland Kitchen glipiZIDE-metformin (METAGLIP) 2.5-500 MG tablet Take 1 tablet by mouth 2 (two) times daily before a meal. 1 Tablet 2 Times daily With Meals  . glucose blood test strip 1 strip by Other route daily.  . insulin NPH Human (HUMULIN N,NOVOLIN N) 100 UNIT/ML injection Inject 20 Units into the skin every evening.  . IRON PO Take 1 capsule by mouth daily.  Marland Kitchen lisinopril (PRINIVIL,ZESTRIL) 20 MG tablet Take 1 tablet (20 mg total) by mouth daily.  . metFORMIN (GLUCOPHAGE) 1000 MG tablet Take 1 tablet by mouth 2 (two) times daily.  . mupirocin ointment (BACTROBAN) 2 % Place 1 application into the nose 2 (two) times daily.  Marland Kitchen NIFEdipine (PROCARDIA-XL/ADALAT CC) 60 MG 24 hr tablet Take 1 tablet by mouth daily.  . Phosphatidylserine-DHA-EPA 100-19.5-6.5 MG CAPS Take by mouth.  . Potassium 99 MG TABS Take 1 tablet by mouth daily.  . pregabalin (LYRICA) 150 MG capsule Take 1 capsule (150 mg total)  by mouth 2 (two) times daily.  . simvastatin (ZOCOR) 20 MG tablet One tablet every evening. PATIENT NEEDS TO SCHEDULE OFFICE VISIT FOR FOLLOW UP  . simvastatin (ZOCOR) 40 MG tablet Take 1 tablet by mouth daily.  . tamsulosin (FLOMAX) 0.4 MG CAPS capsule Take 1 capsule (0.4 mg total) by mouth daily.  Marland Kitchen triamterene-hydrochlorothiazide (DYAZIDE) 37.5-25 MG capsule TAKE ONE CAPSULE DAILY USUALLY IN THE MORNING  . Vitamin D, Ergocalciferol, (DRISDOL) 50000 units CAPS capsule TAKE ONE CAPSULE ONCE A WEEK    Past Medical History:  Diagnosis Date  . Diabetes mellitus without complication ([place])   . H/O knee surgery 04/17/2015   Overview:  Bilateral knees surgery   . Hyperlipemia   . Hypersomnia   . Hypertension   . Insomnia   . Lumbago   . Myalgia   . Neuropathy ([place])   . Sciatica   . Thyroid disease     Past Surgical History:  Procedure Laterality Date  . knee surgery Bilateral 2000  . SKIN GRAFT Bilateral 1955   legs    Social History Social History  Substance Use Topics  . Smoking status: Former Smoker    Packs/day: 2.00    Years: 30.00    Types: Cigarettes  . Smokeless tobacco: Never Used  . Alcohol use 0.0 oz/week     Comment: occasionally    Family History Family History  Problem Relation Age of Onset  . Hypertension Father   . Cancer Brother  Liver Cancer  No family history of bleeding/clotting disorders, porphyria or autoimmune disease   Allergies  Allergen Reactions  . Doxycycline Rash     REVIEW OF SYSTEMS (Negative unless checked)  Constitutional: [] Weight loss  [] Fever  [] Chills Cardiac: [] Chest pain   [] Chest pressure   [] Palpitations   [] Shortness of breath when laying flat   [] Shortness of breath with exertion. Vascular:  [] Pain in legs with walking   [x] Pain in legs at rest  [] History of DVT   [] Phlebitis   [x] Swelling in legs   [] Varicose veins   [x] Non-healing ulcers Pulmonary:   [] Uses home oxygen   [] Productive cough   [] Hemoptysis    [] Wheeze  [] COPD   [] Asthma Neurologic:  [] Dizziness   [] Seizures   [] History of stroke   [] History of TIA  [] Aphasia   [] Vissual changes   [] Weakness or numbness in arm   [] Weakness or numbness in leg Musculoskeletal:   [] Joint swelling   [] Joint pain   [] Low back pain Hematologic:  [] Easy bruising  [] Easy bleeding   [] Hypercoagulable state   [] Anemic Gastrointestinal:  [] Diarrhea   [] Vomiting  [] Gastroesophageal reflux/heartburn   [] Difficulty swallowing. Genitourinary:  [] Chronic kidney disease   [] Difficult urination  [] Frequent urination   [] Blood in urine Skin:  [x] Rashes   [x] Ulcers  Psychological:  [] History of anxiety   [x]  History of major depression.  Physical Examination  Vitals:   01/26/17 1340  BP: 120/63  Pulse: 99  Resp: 16  Weight: 295 lb (133.8 kg)   Body mass index is 40.01 kg/m. Gen: WD/WN, NAD Head: [place]/AT, No temporalis wasting.  Ear/Nose/Throat: Hearing grossly intact, nares w/o erythema or drainage, poor dentition Eyes: PER, EOMI, sclera nonicteric.  Neck: Supple, no masses.  No bruit or JVD.  Pulmonary:  Good air movement, clear to auscultation bilaterally, no use of accessory muscles.  Cardiac: RRR, normal S1, S2, no Murmurs. Vascular: severe venous stasis changes with atrophy blanch Left >> Right. There is extensive scaring from a remote bun and  previously healed Venous ulcers on the left.  The left ankle wound is not weeping as much as last visit and there is much less redness and tenderness Vessel Right Left  Radial Palpable Palpable  Ulnar Palpable Palpable  Brachial Palpable Palpable  Carotid Palpable Palpable  Femoral Palpable Palpable  Popliteal Palpable Palpable  PT Palpable Palpable  DP Palpable Palpable   Gastrointestinal: soft, non-distended. No guarding/no peritoneal signs.  Musculoskeletal: M/S 5/5 throughout.  No deformity or atrophy.  Neurologic: CN 2-12 intact. Pain and light touch intact in extremities.  Symmetrical.  Speech is  fluent. Motor exam as listed above. Psychiatric: Judgment intact, Mood & affect appropriate for pt's clinical situation. Dermatologic: See above for description of his rashes and ulcers.  No changes consistent with cellulitis. Lymph : No Cervical lymphadenopathy, no lichenification or skin changes of chronic lymphedema.  CBC Lab Results  Component Value Date   WBC 8.5 10/11/2014   HGB 15.0 10/11/2014   HCT 45.4 10/11/2014   PLT 249 10/11/2014    BMET    Component Value Date/Time   NA 139 05/02/2015   K 4.3 05/02/2015   BUN 17 05/02/2015   CREATININE 0.90 05/02/2015   CrCl cannot be calculated (Patient's most recent lab result is older than the maximum 21 days allowed.).  COAG No results found for: INR, PROTIME  Radiology No results found.  Assessment/Plan 1. Venous ulcer of ankle, left ([place]) I have reviewed my discussion with the patient regarding  venous insufficiency and why it causes symptoms, specifically venous ulceration.  I have discussed with the patient the chronic skin changes that accompany venous insufficiency and the long term sequela such as infection and recurring ulceration. He will continue graduated compression.  He has consented to an Haematologist today.  Given thedesreased redness and pain and the reduction of the weeping I will not continue Doxycycline as he has completed his course of treatment.  In addition, behavioral modification including several periods of elevation of the lower extremities during the day will be continued. Achieving a position with the ankles at heart level was stressed to the patient  PLAN/PROCEDURE: A 3 layer wrap was placed on the left lower extremity.  Follow-up in 1 week.  2. Chronic venous insufficiency Continue compression with the Sigvaris on the right and Unna on the left  3. Lymphedema See #2  4. Mixed hyperlipidemia Continue statin as ordered and reviewed, no changes at this time   5. Essential  hypertension Continue antihypertensive medications as already ordered, these medications have been reviewed and there are no changes at this time.   Hortencia Pilar, MD  01/26/2017 4:54 PM

## 2017-01-29 DIAGNOSIS — G4733 Obstructive sleep apnea (adult) (pediatric): Secondary | ICD-10-CM | POA: Diagnosis not present

## 2017-01-29 DIAGNOSIS — E1140 Type 2 diabetes mellitus with diabetic neuropathy, unspecified: Secondary | ICD-10-CM | POA: Diagnosis not present

## 2017-01-29 DIAGNOSIS — R5383 Other fatigue: Secondary | ICD-10-CM | POA: Diagnosis not present

## 2017-01-29 DIAGNOSIS — R413 Other amnesia: Secondary | ICD-10-CM | POA: Diagnosis not present

## 2017-01-29 DIAGNOSIS — Z794 Long term (current) use of insulin: Secondary | ICD-10-CM | POA: Diagnosis not present

## 2017-01-29 DIAGNOSIS — I10 Essential (primary) hypertension: Secondary | ICD-10-CM | POA: Diagnosis not present

## 2017-01-29 DIAGNOSIS — Z6841 Body Mass Index (BMI) 40.0 and over, adult: Secondary | ICD-10-CM | POA: Diagnosis not present

## 2017-01-29 LAB — HEMOGLOBIN A1C: HEMOGLOBIN A1C: 8.8

## 2017-02-02 ENCOUNTER — Encounter (INDEPENDENT_AMBULATORY_CARE_PROVIDER_SITE_OTHER): Payer: Self-pay | Admitting: Vascular Surgery

## 2017-02-02 ENCOUNTER — Ambulatory Visit (INDEPENDENT_AMBULATORY_CARE_PROVIDER_SITE_OTHER): Payer: Medicare Other | Admitting: Vascular Surgery

## 2017-02-02 VITALS — BP 142/71 | HR 68 | Resp 16 | Ht 71.0 in | Wt 293.0 lb

## 2017-02-02 DIAGNOSIS — I872 Venous insufficiency (chronic) (peripheral): Secondary | ICD-10-CM

## 2017-02-02 DIAGNOSIS — I83023 Varicose veins of left lower extremity with ulcer of ankle: Secondary | ICD-10-CM

## 2017-02-02 DIAGNOSIS — L97329 Non-pressure chronic ulcer of left ankle with unspecified severity: Principal | ICD-10-CM

## 2017-02-02 NOTE — Progress Notes (Signed)
History of Present Illness  There is no documented history at this time  Assessments & Plan   There are no diagnoses linked to this encounter.    Additional instructions  Subjective:  Patient presents with venous ulcer of the Left lower extremity.    Procedure:  3 layer unna wrap was placed Left lower extremity.   Plan:   Follow up in one week.  

## 2017-02-05 ENCOUNTER — Ambulatory Visit (INDEPENDENT_AMBULATORY_CARE_PROVIDER_SITE_OTHER): Payer: Medicare Other | Admitting: Vascular Surgery

## 2017-02-05 ENCOUNTER — Encounter (INDEPENDENT_AMBULATORY_CARE_PROVIDER_SITE_OTHER): Payer: Self-pay | Admitting: Vascular Surgery

## 2017-02-05 VITALS — BP 131/67 | HR 78 | Resp 16 | Wt 291.0 lb

## 2017-02-05 DIAGNOSIS — L97329 Non-pressure chronic ulcer of left ankle with unspecified severity: Principal | ICD-10-CM

## 2017-02-05 DIAGNOSIS — I83023 Varicose veins of left lower extremity with ulcer of ankle: Secondary | ICD-10-CM

## 2017-02-05 DIAGNOSIS — I872 Venous insufficiency (chronic) (peripheral): Secondary | ICD-10-CM

## 2017-02-05 NOTE — Progress Notes (Signed)
History of Present Illness  There is no documented history at this time  Assessments & Plan   There are no diagnoses linked to this encounter.    Additional instructions  Subjective:  Patient presents with venous ulcer of the Left lower extremity.    Procedure:  3 layer unna wrap was placed Left lower extremity.   Plan:   Follow up in one week.  

## 2017-03-12 ENCOUNTER — Other Ambulatory Visit: Payer: Self-pay | Admitting: Family Medicine

## 2017-03-12 ENCOUNTER — Ambulatory Visit (INDEPENDENT_AMBULATORY_CARE_PROVIDER_SITE_OTHER): Payer: Medicare Other | Admitting: Vascular Surgery

## 2017-03-12 ENCOUNTER — Encounter (INDEPENDENT_AMBULATORY_CARE_PROVIDER_SITE_OTHER): Payer: Self-pay | Admitting: Vascular Surgery

## 2017-03-12 VITALS — BP 136/67 | HR 88 | Resp 17 | Wt 290.0 lb

## 2017-03-12 DIAGNOSIS — L97329 Non-pressure chronic ulcer of left ankle with unspecified severity: Secondary | ICD-10-CM

## 2017-03-12 DIAGNOSIS — I10 Essential (primary) hypertension: Secondary | ICD-10-CM | POA: Diagnosis not present

## 2017-03-12 DIAGNOSIS — E782 Mixed hyperlipidemia: Secondary | ICD-10-CM

## 2017-03-12 DIAGNOSIS — I83023 Varicose veins of left lower extremity with ulcer of ankle: Secondary | ICD-10-CM

## 2017-03-12 DIAGNOSIS — M159 Polyosteoarthritis, unspecified: Secondary | ICD-10-CM

## 2017-03-12 DIAGNOSIS — I872 Venous insufficiency (chronic) (peripheral): Secondary | ICD-10-CM

## 2017-03-12 NOTE — Progress Notes (Signed)
MRN : [id number]  Norman Palmer is a 76 y.o. ([birth year]) male who presents with chief complaint of  Chief Complaint  Patient presents with  . Follow-up  .  History of Present Illness: Patient is seen for follow up evaluation of leg pain and swelling associated with venous ulceration. The patient was recently seen here and started on Unna boot therapy.  The swelling abruptly became much worse bilaterally and is associated with pain and discoloration. The pain and swelling worsens with prolonged dependency and improves with elevation.  The patient notes that in the morning the legs are better but the leg symptoms worsened throughout the course of the day. The patient has also noted a progressive worsening of the discoloration in the ankle and shin area.   The patient notes that that he has been tolerating left Unna boot   The patient states that they have been elevating as much as possible. The patient denies any recent changes in medications.  The patient denies a history of DVT or PE. There is no prior history of phlebitis. There is no history of primary lymphedema.  No SOB or increased cough.  No sputum production.  No recent episodes of CHF exacerbation.   Current Meds  Medication Sig  . celecoxib (CELEBREX) 200 MG capsule Take 1 capsule by mouth daily.  Marland Kitchen donepezil (ARICEPT) 10 MG tablet   . doxycycline (VIBRAMYCIN) 100 MG capsule Take 1 capsule (100 mg total) by mouth 2 (two) times daily.  Marland Kitchen glipiZIDE-metformin (METAGLIP) 2.5-500 MG tablet Take 1 tablet by mouth 2 (two) times daily before a meal. 1 Tablet 2 Times daily With Meals  . glucose blood test strip 1 strip by Other route daily.  . insulin NPH Human (HUMULIN N,NOVOLIN N) 100 UNIT/ML injection Inject 20 Units into the skin every evening.  . IRON PO Take 1 capsule by mouth daily.  . Liraglutide (VICTOZA) 18 MG/3ML SOPN Inject 1.8mg  daily.  PATIENT NEEDS TO SCHEDULE OFFICE VISIT FOR FOLLOW UP  . lisinopril  (PRINIVIL,ZESTRIL) 20 MG tablet Take 1 tablet (20 mg total) by mouth daily.  . metFORMIN (GLUCOPHAGE) 1000 MG tablet Take 1 tablet by mouth 2 (two) times daily.  . mupirocin ointment (BACTROBAN) 2 % Place 1 application into the nose 2 (two) times daily.  Marland Kitchen NIFEdipine (PROCARDIA-XL/ADALAT CC) 60 MG 24 hr tablet Take 1 tablet by mouth daily.  . Phosphatidylserine-DHA-EPA 100-19.5-6.5 MG CAPS Take by mouth.  . Potassium 99 MG TABS Take 1 tablet by mouth daily.  . pregabalin (LYRICA) 150 MG capsule Take 1 capsule (150 mg total) by mouth 2 (two) times daily.  . simvastatin (ZOCOR) 20 MG tablet One tablet every evening. PATIENT NEEDS TO SCHEDULE OFFICE VISIT FOR FOLLOW UP  . simvastatin (ZOCOR) 40 MG tablet Take 1 tablet by mouth daily.  . tamsulosin (FLOMAX) 0.4 MG CAPS capsule Take 1 capsule (0.4 mg total) by mouth daily.  Marland Kitchen triamterene-hydrochlorothiazide (DYAZIDE) 37.5-25 MG capsule TAKE ONE CAPSULE DAILY USUALLY IN THE MORNING  . Vitamin D, Ergocalciferol, (DRISDOL) 50000 units CAPS capsule TAKE ONE CAPSULE ONCE A WEEK    Past Medical History:  Diagnosis Date  . Diabetes mellitus without complication ([place])   . H/O knee surgery 04/17/2015   Overview:  Bilateral knees surgery   . Hyperlipemia   . Hypersomnia   . Hypertension   . Insomnia   . Lumbago   . Myalgia   . Neuropathy ([place])   . Sciatica   . Thyroid disease  Past Surgical History:  Procedure Laterality Date  . knee surgery Bilateral 2000  . SKIN GRAFT Bilateral 1955   legs    Social History Social History  Substance Use Topics  . Smoking status: Former Smoker    Packs/day: 2.00    Years: 30.00    Types: Cigarettes  . Smokeless tobacco: Never Used  . Alcohol use 0.0 oz/week     Comment: occasionally    Family History Family History  Problem Relation Age of Onset  . Hypertension Father   . Cancer Brother     Liver Cancer  No family history of bleeding/clotting disorders, porphyria or autoimmune  disease   Allergies  Allergen Reactions  . Doxycycline Rash     REVIEW OF SYSTEMS (Negative unless checked)  Constitutional: [] Weight loss  [] Fever  [] Chills Cardiac: [] Chest pain   [] Chest pressure   [] Palpitations   [] Shortness of breath when laying flat   [] Shortness of breath with exertion. Vascular:  [] Pain in legs with walking   [] Pain in legs at rest  [] History of DVT   [] Phlebitis   [x] Swelling in legs   [] Varicose veins   [x] Non-healing ulcers Pulmonary:   [] Uses home oxygen   [] Productive cough   [] Hemoptysis   [] Wheeze  [] COPD   [] Asthma Neurologic:  [] Dizziness   [] Seizures   [] History of stroke   [] History of TIA  [] Aphasia   [] Vissual changes   [] Weakness or numbness in arm   [] Weakness or numbness in leg Musculoskeletal:   [] Joint swelling   [x] Joint pain   [] Low back pain Hematologic:  [] Easy bruising  [] Easy bleeding   [] Hypercoagulable state   [] Anemic Gastrointestinal:  [] Diarrhea   [] Vomiting  [] Gastroesophageal reflux/heartburn   [] Difficulty swallowing. Genitourinary:  [] Chronic kidney disease   [] Difficult urination  [] Frequent urination   [] Blood in urine Skin:  [] Rashes   [x] Ulcers  Psychological:  [] History of anxiety   []  History of major depression.  Physical Examination  Vitals:   03/12/17 0859  BP: 136/67  Pulse: 88  Resp: 17  Weight: 131.5 kg (290 lb)   Body mass index is 40.45 kg/m. Gen: WD/WN, NAD Head: [place]/AT, No temporalis wasting.  Ear/Nose/Throat: Hearing grossly intact, nares w/o erythema or drainage, poor dentition Eyes: PER, EOMI, sclera nonicteric.  Neck: Supple, no masses.  No bruit or JVD.  Pulmonary:  Good air movement, clear to auscultation bilaterally, no use of accessory muscles.  Cardiac: RRR, normal S1, S2, no Murmurs. Vascular: severe venous stasis changes with atrophy blanch Left >> Right. There is extensive scaring from a remote bun and  previously healed Venous ulcers on the left.  The left ankle wound show much less  redness and tenderness, it is now very superficial and has good granulation.  No infection Vessel Right Left  Radial Palpable Palpable  Ulnar Palpable Palpable  Brachial Palpable Palpable  Carotid Palpable Palpable  Femoral Palpable Palpable  Popliteal Palpable Palpable  PT Palpable Palpable  DP Palpable Palpable  Gastrointestinal: soft, non-distended. No guarding/no peritoneal signs.  Musculoskeletal: M/S 5/5 throughout.  No deformity or atrophy.  Neurologic: CN 2-12 intact. Pain and light touch intact in extremities.  Symmetrical.  Speech is fluent. Motor exam as listed above. Psychiatric: Judgment intact, Mood & affect appropriate for pt's clinical situation. Dermatologic: No rashes or ulcers noted.  No changes consistent with cellulitis. Lymph : No Cervical lymphadenopathy, no lichenification or skin changes of chronic lymphedema.  CBC Lab Results  Component Value Date   WBC 8.5 10/11/2014  HGB 15.0 10/11/2014   HCT 45.4 10/11/2014   PLT 249 10/11/2014    BMET    Component Value Date/Time   NA 139 05/02/2015   K 4.3 05/02/2015   BUN 17 05/02/2015   CREATININE 0.90 05/02/2015   CrCl cannot be calculated (Patient's most recent lab result is older than the maximum 21 days allowed.).  COAG No results found for: INR, PROTIME  Radiology No results found.  Assessment/Plan 1. Venous ulcer of ankle, left ([place]) I have reviewed mydiscussion with the patient regarding venous insufficiency and why it causes symptoms, specifically venous ulceration. I have discussed with the patient the chronic skin changes that accompany venous insufficiency and the long term sequela such as infection and recurring ulceration. He will continuegraduated compression.   He has been doing much better in the [employer] and today shows marked improvement in his wound.  No evidence for infection at this visit  In addition, behavioral modification including several periods of elevation of  the lower extremities during the day will be continued. Achieving a position with the ankles at heart level was stressed to the patient  PLAN/PROCEDURE: A 3 layer wrap was placed on the left lower extremity.  Follow-up in 1 week.  2. Chronic venous insufficiency Continue compression with the Sigvaris on the right and Unna on the left  3. Lymphedema See #2  4. Mixed hyperlipidemia Continue statin as ordered and reviewed, no changes at this time   5. Essential hypertension Continue antihypertensive medications as already ordered, these medications have been reviewed and there are no changes at this time.  Hortencia Pilar, MD  03/12/2017 9:15 AM

## 2017-03-13 ENCOUNTER — Encounter: Payer: Self-pay | Admitting: Family Medicine

## 2017-03-13 ENCOUNTER — Ambulatory Visit (INDEPENDENT_AMBULATORY_CARE_PROVIDER_SITE_OTHER): Payer: Medicare Other | Admitting: Family Medicine

## 2017-03-13 VITALS — BP 110/48 | HR 85 | Temp 98.3°F | Resp 16 | Wt 290.0 lb

## 2017-03-13 DIAGNOSIS — G9009 Other idiopathic peripheral autonomic neuropathy: Secondary | ICD-10-CM | POA: Diagnosis not present

## 2017-03-13 DIAGNOSIS — Z794 Long term (current) use of insulin: Secondary | ICD-10-CM | POA: Diagnosis not present

## 2017-03-13 DIAGNOSIS — Z1211 Encounter for screening for malignant neoplasm of colon: Secondary | ICD-10-CM | POA: Diagnosis not present

## 2017-03-13 DIAGNOSIS — Z0000 Encounter for general adult medical examination without abnormal findings: Secondary | ICD-10-CM | POA: Diagnosis not present

## 2017-03-13 DIAGNOSIS — I10 Essential (primary) hypertension: Secondary | ICD-10-CM | POA: Diagnosis not present

## 2017-03-13 DIAGNOSIS — G4733 Obstructive sleep apnea (adult) (pediatric): Secondary | ICD-10-CM

## 2017-03-13 DIAGNOSIS — E119 Type 2 diabetes mellitus without complications: Secondary | ICD-10-CM | POA: Diagnosis not present
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
## 2017-03-13 NOTE — Patient Instructions (Signed)

## 2017-03-16 ENCOUNTER — Ambulatory Visit (INDEPENDENT_AMBULATORY_CARE_PROVIDER_SITE_OTHER): Payer: Medicare Other | Admitting: Vascular Surgery

## 2017-03-16 ENCOUNTER — Encounter (INDEPENDENT_AMBULATORY_CARE_PROVIDER_SITE_OTHER): Payer: Self-pay | Admitting: Vascular Surgery

## 2017-03-16 VITALS — BP 146/56 | HR 82 | Resp 17 | Ht 71.0 in | Wt 288.0 lb

## 2017-03-16 DIAGNOSIS — I83023 Varicose veins of left lower extremity with ulcer of ankle: Secondary | ICD-10-CM

## 2017-03-16 DIAGNOSIS — I872 Venous insufficiency (chronic) (peripheral): Secondary | ICD-10-CM

## 2017-03-16 DIAGNOSIS — L97329 Non-pressure chronic ulcer of left ankle with unspecified severity: Principal | ICD-10-CM

## 2017-03-16 NOTE — Progress Notes (Signed)
History of Present Illness  There is no documented history at this time  Assessments & Plan   There are no diagnoses linked to this encounter.    Additional instructions  Subjective:  Patient presents with venous ulcer of the Left lower extremity.    Procedure:  3 layer unna wrap was placed Left lower extremity.   Plan:   Follow up in one week.  

## 2017-03-17 ENCOUNTER — Telehealth: Payer: Self-pay | Admitting: Family Medicine

## 2017-03-17 NOTE — Telephone Encounter (Signed)
Order for cologuard faxed to [facility] °

## 2017-03-19 ENCOUNTER — Encounter (INDEPENDENT_AMBULATORY_CARE_PROVIDER_SITE_OTHER): Payer: Medicare Other

## 2017-03-26 ENCOUNTER — Encounter (INDEPENDENT_AMBULATORY_CARE_PROVIDER_SITE_OTHER): Payer: Medicare Other

## 2017-04-02 ENCOUNTER — Ambulatory Visit (INDEPENDENT_AMBULATORY_CARE_PROVIDER_SITE_OTHER): Payer: Medicare Other | Admitting: Vascular Surgery

## 2017-04-02 ENCOUNTER — Encounter (INDEPENDENT_AMBULATORY_CARE_PROVIDER_SITE_OTHER): Payer: Self-pay | Admitting: Vascular Surgery

## 2017-04-02 VITALS — BP 119/67 | HR 72 | Resp 17 | Wt 287.0 lb

## 2017-04-02 DIAGNOSIS — L97329 Non-pressure chronic ulcer of left ankle with unspecified severity: Principal | ICD-10-CM

## 2017-04-02 DIAGNOSIS — I83023 Varicose veins of left lower extremity with ulcer of ankle: Secondary | ICD-10-CM | POA: Diagnosis not present

## 2017-04-02 NOTE — Progress Notes (Signed)
History of Present Illness  There is no documented history at this time  Assessments & Plan   There are no diagnoses linked to this encounter.    Additional instructions  Subjective:  Patient presents with venous ulcer of the Left lower extremity.    Procedure:  3 layer unna wrap was placed Left lower extremity.   Plan:   Follow up in one week.  

## 2017-04-07 DIAGNOSIS — Z794 Long term (current) use of insulin: Secondary | ICD-10-CM | POA: Diagnosis not present

## 2017-04-07 DIAGNOSIS — R413 Other amnesia: Secondary | ICD-10-CM | POA: Diagnosis not present

## 2017-04-07 DIAGNOSIS — E1140 Type 2 diabetes mellitus with diabetic neuropathy, unspecified: Secondary | ICD-10-CM | POA: Diagnosis not present

## 2017-04-07 DIAGNOSIS — G4733 Obstructive sleep apnea (adult) (pediatric): Secondary | ICD-10-CM | POA: Diagnosis not present

## 2017-04-07 DIAGNOSIS — R5381 Other malaise: Secondary | ICD-10-CM | POA: Diagnosis not present

## 2017-04-09 ENCOUNTER — Encounter (INDEPENDENT_AMBULATORY_CARE_PROVIDER_SITE_OTHER): Payer: Self-pay | Admitting: Vascular Surgery

## 2017-04-09 ENCOUNTER — Ambulatory Visit (INDEPENDENT_AMBULATORY_CARE_PROVIDER_SITE_OTHER): Payer: Medicare Other | Admitting: Vascular Surgery

## 2017-04-09 VITALS — BP 154/66 | HR 86 | Resp 16 | Wt 291.0 lb

## 2017-04-09 DIAGNOSIS — L97322 Non-pressure chronic ulcer of left ankle with fat layer exposed: Secondary | ICD-10-CM | POA: Diagnosis not present

## 2017-04-09 DIAGNOSIS — E119 Type 2 diabetes mellitus without complications: Secondary | ICD-10-CM | POA: Diagnosis not present

## 2017-04-09 DIAGNOSIS — I872 Venous insufficiency (chronic) (peripheral): Secondary | ICD-10-CM | POA: Diagnosis not present

## 2017-04-09 DIAGNOSIS — E782 Mixed hyperlipidemia: Secondary | ICD-10-CM | POA: Diagnosis not present

## 2017-04-09 DIAGNOSIS — Z794 Long term (current) use of insulin: Secondary | ICD-10-CM | POA: Diagnosis not present

## 2017-04-10 NOTE — Progress Notes (Signed)
MRN : [id number]  Norman Palmer is a 75 y.o. ([birth year]) male who presents with chief complaint of  Chief Complaint  Patient presents with  . Follow-up  .  History of Present Illness: Patient is seen for follow up evaluation of leg pain and swelling associated with venous ulceration. The patient was recently seen here and started on Unna boot therapy.  The swelling abruptly became much worse bilaterally and is associated with pain and discoloration. The pain and swelling worsens with prolonged dependency and improves with elevation.  The patient notes that in the morning the legs are better but the leg symptoms worsened throughout the course of the day. The patient has also noted a progressive worsening of the discoloration in the ankle and shin area.   The patient notes that an ulcer has developed acutely without specific trauma and since it occurred it has been very slow to heal.  There is a moderate amount of drainage associated with the open area.  The wound is also very painful.  The patient notes that he has been tolerating the [employer].  The patient states that they have been elevating as much as possible. The patient denies any recent changes in medications.  No SOB or increased cough.  No sputum production.  No recent episodes of CHF exacerbation.   Current Meds  Medication Sig  . celecoxib (CELEBREX) 200 MG capsule Take 1 capsule by mouth daily.  Marland Kitchen donepezil (ARICEPT) 10 MG tablet   . glipiZIDE-metformin (METAGLIP) 2.5-500 MG tablet Take 1 tablet by mouth 2 (two) times daily before a meal. 1 Tablet 2 Times daily With Meals  . glucose blood test strip 1 strip by Other route daily.  . insulin NPH Human (HUMULIN N,NOVOLIN N) 100 UNIT/ML injection Inject 20 Units into the skin every evening.  Marland Kitchen lisinopril (PRINIVIL,ZESTRIL) 20 MG tablet TAKE 1 TABLET EVERY DAY  . metFORMIN (GLUCOPHAGE) 1000 MG tablet Take 1 tablet by mouth 2 (two) times daily.  . mupirocin ointment  (BACTROBAN) 2 % Place 1 application into the nose 2 (two) times daily.  . Phosphatidylserine-DHA-EPA 100-19.5-6.5 MG CAPS Take by mouth.  . Potassium 99 MG TABS Take 1 tablet by mouth daily.  . simvastatin (ZOCOR) 20 MG tablet One tablet every evening. PATIENT NEEDS TO SCHEDULE OFFICE VISIT FOR FOLLOW UP  . tamsulosin (FLOMAX) 0.4 MG CAPS capsule Take 1 capsule (0.4 mg total) by mouth daily.  Marland Kitchen triamterene-hydrochlorothiazide (DYAZIDE) 37.5-25 MG capsule TAKE ONE CAPSULE DAILY USUALLY IN THE MORNING  . Vitamin D, Ergocalciferol, (DRISDOL) 50000 units CAPS capsule TAKE ONE CAPSULE ONCE A WEEK    Past Medical History:  Diagnosis Date  . Diabetes mellitus without complication ([place])   . H/O knee surgery 04/17/2015   Overview:  Bilateral knees surgery   . Hyperlipemia   . Hypersomnia   . Hypertension   . Insomnia   . Lumbago   . Myalgia   . Neuropathy   . Sciatica   . Thyroid disease     Past Surgical History:  Procedure Laterality Date  . knee surgery Bilateral 2000  . SKIN GRAFT Bilateral 1955   legs    Social History Social History  Substance Use Topics  . Smoking status: Former Smoker    Packs/day: 2.00    Years: 30.00    Types: Cigarettes  . Smokeless tobacco: Never Used  . Alcohol use 0.0 oz/week     Comment: occasionally    Family History Family History  Problem  Relation Age of Onset  . Hypertension Father   . Cancer Brother     Liver Cancer    Allergies  Allergen Reactions  . Doxycycline Rash  . Zinc Rash     REVIEW OF SYSTEMS (Negative unless checked)  Constitutional: [] Weight loss  [] Fever  [] Chills Cardiac: [] Chest pain   [] Chest pressure   [] Palpitations   [] Shortness of breath when laying flat   [x] Shortness of breath with exertion. Vascular:  [] Pain in legs with walking   [x] Pain in legs with standing  [] History of DVT   [] Phlebitis   [x] Swelling in legs   [x] Varicose veins   [] Non-healing ulcers Pulmonary:   [] Uses home oxygen   [] Productive  cough   [] Hemoptysis   [] Wheeze  [] COPD   [] Asthma Neurologic:  [] Dizziness   [] Seizures   [] History of stroke   [] History of TIA  [] Aphasia   [] Vissual changes   [] Weakness or numbness in arm   [] Weakness or numbness in leg Musculoskeletal:   [] Joint swelling   [] Joint pain   [] Low back pain Hematologic:  [] Easy bruising  [] Easy bleeding   [] Hypercoagulable state   [] Anemic Gastrointestinal:  [] Diarrhea   [] Vomiting  [] Gastroesophageal reflux/heartburn   [] Difficulty swallowing. Genitourinary:  [] Chronic kidney disease   [] Difficult urination  [] Frequent urination   [] Blood in urine Skin:  [x] Rashes   [x] Ulcers  Psychological:  [] History of anxiety   []  History of major depression.  Physical Examination  Vitals:   04/09/17 1425  BP: (!) 154/66  Pulse: 86  Resp: 16  Weight: 132 kg (291 lb)   Body mass index is 40.59 kg/m. Gen: WD/WN, NAD Head: [place]/AT, No temporalis wasting.  Ear/Nose/Throat: Hearing grossly intact, nares w/o erythema or drainage Eyes: PER, EOMI, sclera nonicteric.  Neck: Supple, no large masses.   Pulmonary:  Good air movement, no audible wheezing bilaterally, no use of accessory muscles.  Cardiac: RRR, no JVD Vascular:  Venous ulcer left medial ankle smaller and granulating  Not infected  Lateral ulcers are essentially healed.  Large varicosities present extensively greater than 4-6 mm. Severe venous stasis changes to the legs bilaterally.  2+ soft pitting edema Vessel Right Left  Radial Palpable Palpable  PT Palpable Palpable  DP Palpable Palpable  Gastrointestinal: Non-distended. No guarding/no peritoneal signs.  Musculoskeletal: M/S 5/5 throughout.  No deformity or atrophy.  Neurologic: CN 2-12 intact. Symmetrical.  Speech is fluent. Motor exam as listed above. Psychiatric: Judgment intact, Mood & affect appropriate for pt's clinical situation. Dermatologic: No rashes or ulcers noted.  No changes consistent with cellulitis. Lymph : No lichenification or skin  changes of chronic lymphedema.  CBC Lab Results  Component Value Date   WBC 8.5 10/11/2014   HGB 15.0 10/11/2014   HCT 45.4 10/11/2014   PLT 249 10/11/2014    BMET    Component Value Date/Time   NA 139 05/02/2015   K 4.3 05/02/2015   BUN 17 05/02/2015   CREATININE 0.90 05/02/2015   CrCl cannot be calculated (Patient's most recent lab result is older than the maximum 21 days allowed.).  COAG No results found for: INR, PROTIME  Radiology No results found.  Assessment/Plan 1. Skin ulcer of left ankle with fat layer exposed ([place]) No surgery or intervention at this point in time.    I have had a long discussion with the patient regarding venous insufficiency and why it  causes symptoms, specifically venous ulceration . I have discussed with the patient the chronic skin changes that accompany  venous insufficiency and the long term sequela such as infection and recurring  ulceration.  Patient will be placed in a left [employer] which will be changed weekly drainage permitting.  In addition, behavioral modification including several periods of elevation of the lower extremities during the day will be continued. Achieving a position with the ankles at heart level was stressed to the patient  The patient is instructed to begin routine exercise, especially walking on a daily basis  Following the review of the ultrasound the patient will follow up in one week to reassess the degree of swelling and the control that Unna therapy is offering.   The patient can be assessed for graduated compression stockings or wraps as well as a Lymph Pump once the ulcers are healed.  2. Chronic venous insufficiency See #1  3. 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[place]) Continue hypoglycemic medications as already ordered, these medications have been reviewed and there are no changes at this time.  Hgb A1C to be monitored as already arranged by primary service   4. Mixed hyperlipidemia Continue  statin as ordered and reviewed, no changes at this time     Hortencia Pilar, MD  04/10/2017 1:35 PM

## 2017-04-16 ENCOUNTER — Encounter (INDEPENDENT_AMBULATORY_CARE_PROVIDER_SITE_OTHER): Payer: Medicare Other

## 2017-04-23 ENCOUNTER — Encounter (INDEPENDENT_AMBULATORY_CARE_PROVIDER_SITE_OTHER): Payer: Self-pay

## 2017-04-23 ENCOUNTER — Ambulatory Visit (INDEPENDENT_AMBULATORY_CARE_PROVIDER_SITE_OTHER): Payer: Medicare Other | Admitting: Vascular Surgery

## 2017-04-23 VITALS — BP 107/58 | HR 95 | Resp 16

## 2017-04-23 DIAGNOSIS — L97322 Non-pressure chronic ulcer of left ankle with fat layer exposed: Secondary | ICD-10-CM

## 2017-04-23 DIAGNOSIS — I872 Venous insufficiency (chronic) (peripheral): Secondary | ICD-10-CM

## 2017-04-23 NOTE — Progress Notes (Signed)
History of Present Illness  There is no documented history at this time  Assessments & Plan   There are no diagnoses linked to this encounter.    Additional instructions  Subjective:  Patient presents with venous ulcer of the Left lower extremity.    Procedure:  3 layer unna wrap was placed Left lower extremity.   Plan:   Follow up in one week.  

## 2017-04-30 ENCOUNTER — Ambulatory Visit (INDEPENDENT_AMBULATORY_CARE_PROVIDER_SITE_OTHER): Payer: Medicare Other | Admitting: Vascular Surgery

## 2017-04-30 ENCOUNTER — Encounter (INDEPENDENT_AMBULATORY_CARE_PROVIDER_SITE_OTHER): Payer: Self-pay

## 2017-04-30 VITALS — BP 146/76 | HR 79 | Resp 16

## 2017-04-30 DIAGNOSIS — L97322 Non-pressure chronic ulcer of left ankle with fat layer exposed: Secondary | ICD-10-CM | POA: Diagnosis not present

## 2017-04-30 DIAGNOSIS — I872 Venous insufficiency (chronic) (peripheral): Secondary | ICD-10-CM

## 2017-04-30 NOTE — Progress Notes (Signed)
History of Present Illness  There is no documented history at this time  Assessments & Plan   There are no diagnoses linked to this encounter.    Additional instructions  Subjective:  Patient presents with venous ulcer of the Left lower extremity.    Procedure:  3 layer unna wrap was placed Left lower extremity.   Plan:   Follow up in one week.  

## 2017-05-04 DIAGNOSIS — H6121 Impacted cerumen, right ear: Secondary | ICD-10-CM | POA: Diagnosis not present

## 2017-05-04 DIAGNOSIS — H60339 Swimmer's ear, unspecified ear: Secondary | ICD-10-CM | POA: Diagnosis not present

## 2017-05-06 DIAGNOSIS — H6122 Impacted cerumen, left ear: Secondary | ICD-10-CM | POA: Diagnosis not present

## 2017-05-06 DIAGNOSIS — H60339 Swimmer's ear, unspecified ear: Secondary | ICD-10-CM | POA: Diagnosis not present

## 2017-05-07 ENCOUNTER — Encounter (INDEPENDENT_AMBULATORY_CARE_PROVIDER_SITE_OTHER): Payer: Self-pay | Admitting: Vascular Surgery

## 2017-05-07 ENCOUNTER — Ambulatory Visit (INDEPENDENT_AMBULATORY_CARE_PROVIDER_SITE_OTHER): Payer: Medicare Other | Admitting: Vascular Surgery

## 2017-05-07 VITALS — BP 124/64 | HR 77 | Resp 16 | Wt 293.0 lb

## 2017-05-07 DIAGNOSIS — I872 Venous insufficiency (chronic) (peripheral): Secondary | ICD-10-CM

## 2017-05-07 DIAGNOSIS — I10 Essential (primary) hypertension: Secondary | ICD-10-CM | POA: Diagnosis not present

## 2017-05-07 DIAGNOSIS — E782 Mixed hyperlipidemia: Secondary | ICD-10-CM

## 2017-05-07 DIAGNOSIS — L97322 Non-pressure chronic ulcer of left ankle with fat layer exposed: Secondary | ICD-10-CM

## 2017-05-07 NOTE — Progress Notes (Signed)
MRN : [id number]  JOHNEDWARD BRODRICK is a 76 y.o. (1941/10/02) male who presents with chief complaint of  Chief Complaint  Patient presents with  . Follow-up    unna check  .  History of Present Illness: Patient is seen for follow up evaluation of leg pain and swelling associated with venous ulceration. The patient has been getting weekly Unna boot therapy.  The swelling has been much improved with the wraps. The pain and swelling worsens with prolonged dependency and improves with elevation.  The patient notes that in the morning the legs are better but the leg symptoms worsened throughout the course of the day.   The patient notes that an ulcer has developed acutely without specific trauma and since it occurred it has been very slow to heal.  There is a moderate amount of drainage associated with the open area.    The patient notes that he has been tolerating the [employer].  The patient states that they have been elevating as much as possible. The patient denies any recent changes in medications.  Current Meds  Medication Sig  . celecoxib (CELEBREX) 200 MG capsule Take 1 capsule by mouth daily.  Marland Kitchen donepezil (ARICEPT) 10 MG tablet   . glipiZIDE-metformin (METAGLIP) 2.5-500 MG tablet Take 1 tablet by mouth 2 (two) times daily before a meal. 1 Tablet 2 Times daily With Meals  . glucose blood test strip 1 strip by Other route daily.  . insulin NPH Human (HUMULIN N,NOVOLIN N) 100 UNIT/ML injection Inject 20 Units into the skin every evening.  Marland Kitchen lisinopril (PRINIVIL,ZESTRIL) 20 MG tablet TAKE 1 TABLET EVERY DAY  . metFORMIN (GLUCOPHAGE) 1000 MG tablet Take 1 tablet by mouth 2 (two) times daily.  . mupirocin ointment (BACTROBAN) 2 % Place 1 application into the nose 2 (two) times daily.  . Phosphatidylserine-DHA-EPA 100-19.5-6.5 MG CAPS Take by mouth.  . Potassium 99 MG TABS Take 1 tablet by mouth daily.  . simvastatin (ZOCOR) 20 MG tablet One tablet every evening. PATIENT NEEDS TO  SCHEDULE OFFICE VISIT FOR FOLLOW UP  . tamsulosin (FLOMAX) 0.4 MG CAPS capsule Take 1 capsule (0.4 mg total) by mouth daily.  Marland Kitchen triamterene-hydrochlorothiazide (DYAZIDE) 37.5-25 MG capsule TAKE ONE CAPSULE DAILY USUALLY IN THE MORNING  . Vitamin D, Ergocalciferol, (DRISDOL) 50000 units CAPS capsule TAKE ONE CAPSULE ONCE A WEEK    Past Medical History:  Diagnosis Date  . Diabetes mellitus without complication ([place])   . H/O knee surgery 04/17/2015   Overview:  Bilateral knees surgery   . Hyperlipemia   . Hypersomnia   . Hypertension   . Insomnia   . Lumbago   . Myalgia   . Neuropathy   . Sciatica   . Thyroid disease     Past Surgical History:  Procedure Laterality Date  . knee surgery Bilateral 2000  . SKIN GRAFT Bilateral 1955   legs    Social History Social History  Substance Use Topics  . Smoking status: Former Smoker    Packs/day: 2.00    Years: 30.00    Types: Cigarettes  . Smokeless tobacco: Never Used  . Alcohol use 0.0 oz/week     Comment: occasionally    Family History Family History  Problem Relation Age of Onset  . Hypertension Father   . Cancer Brother        Liver Cancer    Allergies  Allergen Reactions  . Doxycycline Rash  . Zinc Rash     REVIEW OF  SYSTEMS (Negative unless checked)  Constitutional: [] Weight loss  [] Fever  [] Chills Cardiac: [] Chest pain   [] Chest pressure   [] Palpitations   [] Shortness of breath when laying flat   [] Shortness of breath with exertion. Vascular:  [] Pain in legs with walking   [] Pain in legs with standing  [] History of DVT   [] Phlebitis   [x] Swelling in legs   [x] Varicose veins   [x] Non-healing ulcers Pulmonary:   [] Uses home oxygen   [] Productive cough   [] Hemoptysis   [] Wheeze  [] COPD   [] Asthma Neurologic:  [] Dizziness   [] Seizures   [] History of stroke   [] History of TIA  [] Aphasia   [] Vissual changes   [] Weakness or numbness in arm   [] Weakness or numbness in leg Musculoskeletal:   [] Joint swelling   [] Joint  pain   [] Low back pain Hematologic:  [] Easy bruising  [] Easy bleeding   [] Hypercoagulable state   [] Anemic Gastrointestinal:  [] Diarrhea   [] Vomiting  [] Gastroesophageal reflux/heartburn   [] Difficulty swallowing. Genitourinary:  [] Chronic kidney disease   [] Difficult urination  [] Frequent urination   [] Blood in urine Skin:  [x] Rashes   [x] Ulcers  Psychological:  [] History of anxiety   []  History of major depression.  Physical Examination  Vitals:   05/07/17 1414  BP: 124/64  Pulse: 77  Resp: 16  Weight: 293 lb (132.9 kg)   Body mass index is 40.87 kg/m. Gen: WD/WN, NAD Head: [place]/AT, No temporalis wasting.  Ear/Nose/Throat: Hearing grossly intact, nares w/o erythema or drainage Eyes: PER, EOMI, sclera nonicteric.  Neck: Supple, no large masses.   Pulmonary:  Good air movement, no audible wheezing bilaterally, no use of accessory muscles.  Cardiac: RRR, no JVD Vascular:  Sever venous stasis disease L>R, venous ulcer laterally is now healed and the medial ulcer is 1x2 cm much smaller Vessel Right Left  PT Palpable Palpable  DP Palpable Palpable  Gastrointestinal: Non-distended. No guarding/no peritoneal signs.  Musculoskeletal: M/S 5/5 throughout.  No deformity or atrophy.  Neurologic: CN 2-12 intact. Symmetrical.  Speech is fluent. Motor exam as listed above. Psychiatric: Judgment intact, Mood & affect appropriate for pt's clinical situation. Dermatologic: severe venous rashes with ulcer noted.  No changes consistent with cellulitis. Lymph : No lichenification or skin changes of chronic lymphedema.  CBC Lab Results  Component Value Date   WBC 8.5 10/11/2014   HGB 15.0 10/11/2014   HCT 45.4 10/11/2014   PLT 249 10/11/2014    BMET    Component Value Date/Time   NA 139 05/02/2015   K 4.3 05/02/2015   BUN 17 05/02/2015   CREATININE 0.90 05/02/2015   CrCl cannot be calculated (Patient's most recent lab result is older than the maximum 21 days allowed.).  COAG No  results found for: INR, PROTIME  Radiology No results found.  Assessment/Plan 1. Skin ulcer of left ankle with fat layer exposed ([place]) No surgery or intervention at this point in time.    I have had a long discussion with the patient regarding venous insufficiency and why it  causes symptoms, specifically venous ulceration . I have discussed with the patient the chronic skin changes that accompany venous insufficiency and the long term sequela such as infection and recurring  ulceration.  Patient will be placed in a left [employer] which will be changed weekly drainage permitting.  In addition, behavioral modification including several periods of elevation of the lower extremities during the day will be continued. Achieving a position with the ankles at heart level was stressed to  the patient  The patient is instructed to begin routine exercise, especially walking on a daily basis  Patient should undergo duplex ultrasound of the venous system to ensure that DVT or reflux is not present.  Following the review of the ultrasound the patient will follow up in one week to reassess the degree of swelling and the control that Unna therapy is offering.   The patient can be assessed for graduated compression stockings or wraps as well as a Lymph Pump once the ulcers are healed.   2. Chronic venous insufficiency No surgery or intervention at this point in time.    I have had a long discussion with the patient regarding venous insufficiency and why it  causes symptoms. I have discussed with the patient the chronic skin changes that accompany venous insufficiency and the long term sequela such as infection and ulceration.  Patient will begin wearing graduated compression stockings class 1 (20-30 mmHg) or compression wraps on a daily basis a prescription was given. The patient will put the stockings on first thing in the morning and removing them in the evening. The patient is instructed specifically not to  sleep in the stockings.    In addition, behavioral modification including several periods of elevation of the lower extremities during the day will be continued. I have demonstrated that proper elevation is a position with the ankles at heart level.  The patient is instructed to begin routine exercise, especially walking on a daily basis   3. Essential hypertension Continue antihypertensive medications as already ordered, these medications have been reviewed and there are no changes at this time.   4. Mixed hyperlipidemia Continue statin as ordered and reviewed, no changes at this time     Hortencia Pilar, MD  05/07/2017 2:24 PM

## 2017-05-13 ENCOUNTER — Ambulatory Visit (INDEPENDENT_AMBULATORY_CARE_PROVIDER_SITE_OTHER): Payer: Medicare Other | Admitting: Vascular Surgery

## 2017-05-13 ENCOUNTER — Encounter (INDEPENDENT_AMBULATORY_CARE_PROVIDER_SITE_OTHER): Payer: Self-pay

## 2017-05-13 VITALS — BP 144/71 | HR 71 | Resp 16

## 2017-05-13 DIAGNOSIS — L821 Other seborrheic keratosis: Secondary | ICD-10-CM | POA: Diagnosis not present

## 2017-05-13 DIAGNOSIS — X32XXXA Exposure to sunlight, initial encounter: Secondary | ICD-10-CM | POA: Diagnosis not present

## 2017-05-13 DIAGNOSIS — I872 Venous insufficiency (chronic) (peripheral): Secondary | ICD-10-CM

## 2017-05-13 DIAGNOSIS — L97322 Non-pressure chronic ulcer of left ankle with fat layer exposed: Secondary | ICD-10-CM | POA: Diagnosis not present

## 2017-05-13 DIAGNOSIS — L570 Actinic keratosis: Secondary | ICD-10-CM | POA: Diagnosis not present

## 2017-05-13 DIAGNOSIS — Z85828 Personal history of other malignant neoplasm of skin: Secondary | ICD-10-CM | POA: Diagnosis not present

## 2017-05-13 DIAGNOSIS — Z08 Encounter for follow-up examination after completed treatment for malignant neoplasm: Secondary | ICD-10-CM | POA: Diagnosis not present

## 2017-05-13 NOTE — Progress Notes (Signed)
History of Present Illness  There is no documented history at this time  Assessments & Plan   There are no diagnoses linked to this encounter.    Additional instructions  Subjective:  Patient presents with venous ulcer of the Left lower extremity.    Procedure:  3 layer unna wrap was placed Left lower extremity.   Plan:   Follow up in one week.  

## 2017-05-22 ENCOUNTER — Encounter (INDEPENDENT_AMBULATORY_CARE_PROVIDER_SITE_OTHER): Payer: Self-pay | Admitting: Vascular Surgery

## 2017-05-22 ENCOUNTER — Other Ambulatory Visit: Payer: Self-pay | Admitting: Family Medicine

## 2017-05-22 ENCOUNTER — Ambulatory Visit (INDEPENDENT_AMBULATORY_CARE_PROVIDER_SITE_OTHER): Payer: Medicare Other | Admitting: Vascular Surgery

## 2017-05-22 VITALS — BP 149/69 | HR 64 | Resp 16

## 2017-05-22 DIAGNOSIS — I872 Venous insufficiency (chronic) (peripheral): Secondary | ICD-10-CM

## 2017-05-22 DIAGNOSIS — I10 Essential (primary) hypertension: Secondary | ICD-10-CM

## 2017-05-22 NOTE — Progress Notes (Signed)
History of Present Illness  There is no documented history at this time  Assessments & Plan   There are no diagnoses linked to this encounter.    Additional instructions  Subjective:  Patient presents with venous ulcer of the Left lower extremity.    Procedure:  3 layer unna wrap was placed Left lower extremity.   Plan:   Follow up in one week.  

## 2017-05-27 ENCOUNTER — Encounter (INDEPENDENT_AMBULATORY_CARE_PROVIDER_SITE_OTHER): Payer: Self-pay

## 2017-05-27 ENCOUNTER — Ambulatory Visit (INDEPENDENT_AMBULATORY_CARE_PROVIDER_SITE_OTHER): Payer: Medicare Other | Admitting: Vascular Surgery

## 2017-05-27 VITALS — BP 152/67 | HR 71 | Resp 17 | Ht 72.0 in | Wt 295.0 lb

## 2017-05-27 DIAGNOSIS — I890 Lymphedema, not elsewhere classified: Secondary | ICD-10-CM | POA: Diagnosis not present

## 2017-05-27 DIAGNOSIS — I872 Venous insufficiency (chronic) (peripheral): Secondary | ICD-10-CM | POA: Diagnosis not present

## 2017-05-27 NOTE — Progress Notes (Signed)
History of Present Illness  There is no documented history at this time  Assessments & Plan   There are no diagnoses linked to this encounter.    Additional instructions  Subjective:  Patient presents with venous ulcer of the Left lower extremity.    Procedure:  3 layer unna wrap was placed Left lower extremity.   Plan:   Follow up in one week.  

## 2017-05-28 ENCOUNTER — Encounter (INDEPENDENT_AMBULATORY_CARE_PROVIDER_SITE_OTHER): Payer: Medicare Other

## 2017-06-05 ENCOUNTER — Encounter (INDEPENDENT_AMBULATORY_CARE_PROVIDER_SITE_OTHER): Payer: Medicare Other

## 2017-06-09 ENCOUNTER — Telehealth (INDEPENDENT_AMBULATORY_CARE_PROVIDER_SITE_OTHER): Payer: Self-pay | Admitting: Vascular Surgery

## 2017-06-09 DIAGNOSIS — R5383 Other fatigue: Secondary | ICD-10-CM | POA: Diagnosis not present

## 2017-06-09 DIAGNOSIS — Z794 Long term (current) use of insulin: Secondary | ICD-10-CM | POA: Diagnosis not present

## 2017-06-09 DIAGNOSIS — Z23 Encounter for immunization: Secondary | ICD-10-CM | POA: Diagnosis not present

## 2017-06-09 DIAGNOSIS — G4733 Obstructive sleep apnea (adult) (pediatric): Secondary | ICD-10-CM | POA: Diagnosis not present

## 2017-06-09 DIAGNOSIS — R413 Other amnesia: Secondary | ICD-10-CM | POA: Diagnosis not present

## 2017-06-09 DIAGNOSIS — I10 Essential (primary) hypertension: Secondary | ICD-10-CM | POA: Diagnosis not present

## 2017-06-09 DIAGNOSIS — E1140 Type 2 diabetes mellitus with diabetic neuropathy, unspecified: Secondary | ICD-10-CM | POA: Diagnosis not present

## 2017-06-09 DIAGNOSIS — E782 Mixed hyperlipidemia: Secondary | ICD-10-CM | POA: Diagnosis not present

## 2017-06-11 DIAGNOSIS — R262 Difficulty in walking, not elsewhere classified: Secondary | ICD-10-CM | POA: Diagnosis not present

## 2017-06-11 DIAGNOSIS — M6281 Muscle weakness (generalized): Secondary | ICD-10-CM | POA: Diagnosis not present

## 2017-06-11 DIAGNOSIS — R278 Other lack of coordination: Secondary | ICD-10-CM | POA: Diagnosis not present

## 2017-06-12 DIAGNOSIS — R278 Other lack of coordination: Secondary | ICD-10-CM | POA: Diagnosis not present

## 2017-06-12 DIAGNOSIS — M6281 Muscle weakness (generalized): Secondary | ICD-10-CM | POA: Diagnosis not present

## 2017-06-12 DIAGNOSIS — R262 Difficulty in walking, not elsewhere classified: Secondary | ICD-10-CM | POA: Diagnosis not present

## 2017-06-15 DIAGNOSIS — R278 Other lack of coordination: Secondary | ICD-10-CM | POA: Diagnosis not present

## 2017-06-15 DIAGNOSIS — M6281 Muscle weakness (generalized): Secondary | ICD-10-CM | POA: Diagnosis not present

## 2017-06-15 DIAGNOSIS — R262 Difficulty in walking, not elsewhere classified: Secondary | ICD-10-CM | POA: Diagnosis not present

## 2017-06-17 DIAGNOSIS — R278 Other lack of coordination: Secondary | ICD-10-CM | POA: Diagnosis not present

## 2017-06-17 DIAGNOSIS — M6281 Muscle weakness (generalized): Secondary | ICD-10-CM | POA: Diagnosis not present

## 2017-06-17 DIAGNOSIS — R262 Difficulty in walking, not elsewhere classified: Secondary | ICD-10-CM | POA: Diagnosis not present

## 2017-06-19 DIAGNOSIS — R262 Difficulty in walking, not elsewhere classified: Secondary | ICD-10-CM | POA: Diagnosis not present

## 2017-06-19 DIAGNOSIS — M6281 Muscle weakness (generalized): Secondary | ICD-10-CM | POA: Diagnosis not present

## 2017-06-19 DIAGNOSIS — R278 Other lack of coordination: Secondary | ICD-10-CM | POA: Diagnosis not present

## 2017-06-19 NOTE — Telephone Encounter (Signed)
CREATED IN ERROR

## 2017-07-01 DIAGNOSIS — M6281 Muscle weakness (generalized): Secondary | ICD-10-CM | POA: Diagnosis not present

## 2017-07-01 DIAGNOSIS — R262 Difficulty in walking, not elsewhere classified: Secondary | ICD-10-CM | POA: Diagnosis not present

## 2017-07-01 DIAGNOSIS — R278 Other lack of coordination: Secondary | ICD-10-CM | POA: Diagnosis not present

## 2017-07-02 DIAGNOSIS — R350 Frequency of micturition: Secondary | ICD-10-CM | POA: Diagnosis not present

## 2017-07-02 DIAGNOSIS — Z6841 Body Mass Index (BMI) 40.0 and over, adult: Secondary | ICD-10-CM | POA: Diagnosis not present

## 2017-07-02 DIAGNOSIS — N358 Other urethral stricture: Secondary | ICD-10-CM | POA: Diagnosis not present

## 2017-07-02 DIAGNOSIS — N138 Other obstructive and reflux uropathy: Secondary | ICD-10-CM | POA: Diagnosis not present

## 2017-07-02 DIAGNOSIS — N401 Enlarged prostate with lower urinary tract symptoms: Secondary | ICD-10-CM | POA: Diagnosis not present

## 2017-07-02 DIAGNOSIS — R339 Retention of urine, unspecified: Secondary | ICD-10-CM | POA: Diagnosis not present

## 2017-07-07 DIAGNOSIS — R262 Difficulty in walking, not elsewhere classified: Secondary | ICD-10-CM | POA: Diagnosis not present

## 2017-07-07 DIAGNOSIS — R278 Other lack of coordination: Secondary | ICD-10-CM | POA: Diagnosis not present

## 2017-07-07 DIAGNOSIS — M6281 Muscle weakness (generalized): Secondary | ICD-10-CM | POA: Diagnosis not present

## 2017-07-08 DIAGNOSIS — R262 Difficulty in walking, not elsewhere classified: Secondary | ICD-10-CM | POA: Diagnosis not present

## 2017-07-08 DIAGNOSIS — N138 Other obstructive and reflux uropathy: Secondary | ICD-10-CM | POA: Diagnosis not present

## 2017-07-08 DIAGNOSIS — M6281 Muscle weakness (generalized): Secondary | ICD-10-CM | POA: Diagnosis not present

## 2017-07-08 DIAGNOSIS — R278 Other lack of coordination: Secondary | ICD-10-CM | POA: Diagnosis not present

## 2017-07-08 DIAGNOSIS — N401 Enlarged prostate with lower urinary tract symptoms: Secondary | ICD-10-CM | POA: Diagnosis not present

## 2017-07-08 DIAGNOSIS — R350 Frequency of micturition: Secondary | ICD-10-CM | POA: Diagnosis not present

## 2017-07-08 DIAGNOSIS — N99111 Postprocedural bulbous urethral stricture: Secondary | ICD-10-CM | POA: Diagnosis not present

## 2017-07-08 DIAGNOSIS — Z6841 Body Mass Index (BMI) 40.0 and over, adult: Secondary | ICD-10-CM | POA: Diagnosis not present

## 2017-07-10 DIAGNOSIS — M6281 Muscle weakness (generalized): Secondary | ICD-10-CM | POA: Diagnosis not present

## 2017-07-10 DIAGNOSIS — I10 Essential (primary) hypertension: Secondary | ICD-10-CM | POA: Diagnosis not present

## 2017-07-10 DIAGNOSIS — R278 Other lack of coordination: Secondary | ICD-10-CM | POA: Diagnosis not present

## 2017-07-10 DIAGNOSIS — G4733 Obstructive sleep apnea (adult) (pediatric): Secondary | ICD-10-CM | POA: Diagnosis not present

## 2017-07-10 DIAGNOSIS — R262 Difficulty in walking, not elsewhere classified: Secondary | ICD-10-CM | POA: Diagnosis not present

## 2017-07-10 DIAGNOSIS — I119 Hypertensive heart disease without heart failure: Secondary | ICD-10-CM | POA: Diagnosis not present

## 2017-07-13 DIAGNOSIS — M6281 Muscle weakness (generalized): Secondary | ICD-10-CM | POA: Diagnosis not present

## 2017-07-13 DIAGNOSIS — R278 Other lack of coordination: Secondary | ICD-10-CM | POA: Diagnosis not present

## 2017-07-13 DIAGNOSIS — R262 Difficulty in walking, not elsewhere classified: Secondary | ICD-10-CM | POA: Diagnosis not present

## 2017-07-21 DIAGNOSIS — M6281 Muscle weakness (generalized): Secondary | ICD-10-CM | POA: Diagnosis not present

## 2017-07-21 DIAGNOSIS — R278 Other lack of coordination: Secondary | ICD-10-CM | POA: Diagnosis not present

## 2017-07-21 DIAGNOSIS — R262 Difficulty in walking, not elsewhere classified: Secondary | ICD-10-CM | POA: Diagnosis not present

## 2017-07-23 DIAGNOSIS — E118 Type 2 diabetes mellitus with unspecified complications: Secondary | ICD-10-CM | POA: Diagnosis not present

## 2017-07-23 DIAGNOSIS — M6281 Muscle weakness (generalized): Secondary | ICD-10-CM | POA: Diagnosis not present

## 2017-07-23 DIAGNOSIS — Z6841 Body Mass Index (BMI) 40.0 and over, adult: Secondary | ICD-10-CM | POA: Diagnosis not present

## 2017-07-23 DIAGNOSIS — R413 Other amnesia: Secondary | ICD-10-CM | POA: Diagnosis not present

## 2017-07-23 DIAGNOSIS — R278 Other lack of coordination: Secondary | ICD-10-CM | POA: Diagnosis not present

## 2017-07-23 DIAGNOSIS — E669 Obesity, unspecified: Secondary | ICD-10-CM | POA: Diagnosis not present

## 2017-07-23 DIAGNOSIS — N99111 Postprocedural bulbous urethral stricture: Secondary | ICD-10-CM | POA: Diagnosis not present

## 2017-07-23 DIAGNOSIS — M1990 Unspecified osteoarthritis, unspecified site: Secondary | ICD-10-CM | POA: Diagnosis not present

## 2017-07-23 DIAGNOSIS — R262 Difficulty in walking, not elsewhere classified: Secondary | ICD-10-CM | POA: Diagnosis not present

## 2017-07-23 DIAGNOSIS — Z01818 Encounter for other preprocedural examination: Secondary | ICD-10-CM | POA: Diagnosis not present

## 2017-07-23 DIAGNOSIS — I10 Essential (primary) hypertension: Secondary | ICD-10-CM | POA: Diagnosis not present

## 2017-07-27 DIAGNOSIS — E669 Obesity, unspecified: Secondary | ICD-10-CM | POA: Diagnosis not present

## 2017-07-27 DIAGNOSIS — I10 Essential (primary) hypertension: Secondary | ICD-10-CM | POA: Diagnosis not present

## 2017-07-27 DIAGNOSIS — R350 Frequency of micturition: Secondary | ICD-10-CM | POA: Diagnosis not present

## 2017-07-27 DIAGNOSIS — Z794 Long term (current) use of insulin: Secondary | ICD-10-CM | POA: Diagnosis not present

## 2017-07-27 DIAGNOSIS — E119 Type 2 diabetes mellitus without complications: Secondary | ICD-10-CM | POA: Diagnosis not present

## 2017-07-27 DIAGNOSIS — N401 Enlarged prostate with lower urinary tract symptoms: Secondary | ICD-10-CM | POA: Diagnosis not present

## 2017-07-27 DIAGNOSIS — Z79899 Other long term (current) drug therapy: Secondary | ICD-10-CM | POA: Diagnosis not present

## 2017-07-27 DIAGNOSIS — Z87891 Personal history of nicotine dependence: Secondary | ICD-10-CM | POA: Diagnosis not present

## 2017-07-27 DIAGNOSIS — M1990 Unspecified osteoarthritis, unspecified site: Secondary | ICD-10-CM | POA: Diagnosis not present

## 2017-07-27 DIAGNOSIS — R339 Retention of urine, unspecified: Secondary | ICD-10-CM | POA: Diagnosis not present

## 2017-07-27 DIAGNOSIS — N99111 Postprocedural bulbous urethral stricture: Secondary | ICD-10-CM | POA: Diagnosis not present

## 2017-07-27 DIAGNOSIS — N358 Other urethral stricture: Secondary | ICD-10-CM | POA: Diagnosis not present

## 2017-07-27 DIAGNOSIS — N138 Other obstructive and reflux uropathy: Secondary | ICD-10-CM | POA: Diagnosis not present

## 2017-07-27 DIAGNOSIS — Z888 Allergy status to other drugs, medicaments and biological substances status: Secondary | ICD-10-CM | POA: Diagnosis not present

## 2017-07-27 DIAGNOSIS — Z881 Allergy status to other antibiotic agents status: Secondary | ICD-10-CM | POA: Diagnosis not present

## 2017-08-03 DIAGNOSIS — M6281 Muscle weakness (generalized): Secondary | ICD-10-CM | POA: Diagnosis not present

## 2017-08-03 DIAGNOSIS — R278 Other lack of coordination: Secondary | ICD-10-CM | POA: Diagnosis not present

## 2017-08-03 DIAGNOSIS — R262 Difficulty in walking, not elsewhere classified: Secondary | ICD-10-CM | POA: Diagnosis not present

## 2017-08-04 DIAGNOSIS — M6281 Muscle weakness (generalized): Secondary | ICD-10-CM | POA: Diagnosis not present

## 2017-08-04 DIAGNOSIS — R262 Difficulty in walking, not elsewhere classified: Secondary | ICD-10-CM | POA: Diagnosis not present

## 2017-08-04 DIAGNOSIS — R278 Other lack of coordination: Secondary | ICD-10-CM | POA: Diagnosis not present

## 2017-08-11 DIAGNOSIS — N99111 Postprocedural bulbous urethral stricture: Secondary | ICD-10-CM | POA: Diagnosis not present

## 2017-08-11 DIAGNOSIS — N401 Enlarged prostate with lower urinary tract symptoms: Secondary | ICD-10-CM | POA: Diagnosis not present

## 2017-08-11 DIAGNOSIS — N138 Other obstructive and reflux uropathy: Secondary | ICD-10-CM | POA: Diagnosis not present

## 2017-08-11 DIAGNOSIS — R339 Retention of urine, unspecified: Secondary | ICD-10-CM | POA: Diagnosis not present

## 2017-08-11 DIAGNOSIS — Z6841 Body Mass Index (BMI) 40.0 and over, adult: Secondary | ICD-10-CM | POA: Diagnosis not present

## 2017-08-19 DIAGNOSIS — M1990 Unspecified osteoarthritis, unspecified site: Secondary | ICD-10-CM | POA: Diagnosis not present

## 2017-08-19 DIAGNOSIS — E119 Type 2 diabetes mellitus without complications: Secondary | ICD-10-CM | POA: Diagnosis not present

## 2017-08-19 DIAGNOSIS — N359 Urethral stricture, unspecified: Secondary | ICD-10-CM | POA: Diagnosis not present

## 2017-08-19 DIAGNOSIS — Z794 Long term (current) use of insulin: Secondary | ICD-10-CM | POA: Diagnosis not present

## 2017-08-19 DIAGNOSIS — R0602 Shortness of breath: Secondary | ICD-10-CM | POA: Diagnosis not present

## 2017-08-19 DIAGNOSIS — R55 Syncope and collapse: Secondary | ICD-10-CM | POA: Diagnosis not present

## 2017-08-19 DIAGNOSIS — I10 Essential (primary) hypertension: Secondary | ICD-10-CM | POA: Diagnosis not present

## 2017-08-19 DIAGNOSIS — N401 Enlarged prostate with lower urinary tract symptoms: Secondary | ICD-10-CM | POA: Diagnosis not present

## 2017-08-19 DIAGNOSIS — Z87891 Personal history of nicotine dependence: Secondary | ICD-10-CM | POA: Diagnosis not present

## 2017-08-19 DIAGNOSIS — Z79899 Other long term (current) drug therapy: Secondary | ICD-10-CM | POA: Diagnosis not present

## 2017-08-19 DIAGNOSIS — Z833 Family history of diabetes mellitus: Secondary | ICD-10-CM | POA: Diagnosis not present

## 2017-08-19 DIAGNOSIS — N138 Other obstructive and reflux uropathy: Secondary | ICD-10-CM | POA: Diagnosis not present

## 2017-08-19 DIAGNOSIS — R159 Full incontinence of feces: Secondary | ICD-10-CM | POA: Diagnosis not present

## 2017-08-19 DIAGNOSIS — E1165 Type 2 diabetes mellitus with hyperglycemia: Secondary | ICD-10-CM | POA: Diagnosis not present

## 2017-08-19 DIAGNOSIS — R339 Retention of urine, unspecified: Secondary | ICD-10-CM | POA: Diagnosis not present

## 2017-08-19 DIAGNOSIS — R42 Dizziness and giddiness: Secondary | ICD-10-CM | POA: Diagnosis not present

## 2017-08-21 DIAGNOSIS — N401 Enlarged prostate with lower urinary tract symptoms: Secondary | ICD-10-CM | POA: Diagnosis not present

## 2017-08-21 DIAGNOSIS — R197 Diarrhea, unspecified: Secondary | ICD-10-CM | POA: Diagnosis not present

## 2017-08-21 DIAGNOSIS — R3914 Feeling of incomplete bladder emptying: Secondary | ICD-10-CM | POA: Diagnosis not present

## 2017-08-21 DIAGNOSIS — Z794 Long term (current) use of insulin: Secondary | ICD-10-CM | POA: Diagnosis not present

## 2017-08-21 DIAGNOSIS — R413 Other amnesia: Secondary | ICD-10-CM | POA: Diagnosis not present

## 2017-08-21 DIAGNOSIS — E1140 Type 2 diabetes mellitus with diabetic neuropathy, unspecified: Secondary | ICD-10-CM | POA: Diagnosis not present

## 2017-08-21 DIAGNOSIS — N359 Urethral stricture, unspecified: Secondary | ICD-10-CM | POA: Diagnosis not present

## 2017-08-26 DIAGNOSIS — N99111 Postprocedural bulbous urethral stricture: Secondary | ICD-10-CM | POA: Diagnosis not present

## 2017-08-26 DIAGNOSIS — Z6841 Body Mass Index (BMI) 40.0 and over, adult: Secondary | ICD-10-CM | POA: Diagnosis not present

## 2017-08-31 DIAGNOSIS — N99111 Postprocedural bulbous urethral stricture: Secondary | ICD-10-CM | POA: Diagnosis not present

## 2017-09-10 DIAGNOSIS — R262 Difficulty in walking, not elsewhere classified: Secondary | ICD-10-CM | POA: Diagnosis not present

## 2017-09-10 DIAGNOSIS — M6281 Muscle weakness (generalized): Secondary | ICD-10-CM | POA: Diagnosis not present

## 2017-09-10 DIAGNOSIS — R278 Other lack of coordination: Secondary | ICD-10-CM | POA: Diagnosis not present

## 2017-09-22 ENCOUNTER — Other Ambulatory Visit: Payer: Self-pay | Admitting: Family Medicine

## 2017-09-24 DIAGNOSIS — M6281 Muscle weakness (generalized): Secondary | ICD-10-CM | POA: Diagnosis not present

## 2017-09-24 DIAGNOSIS — R278 Other lack of coordination: Secondary | ICD-10-CM | POA: Diagnosis not present

## 2017-09-24 DIAGNOSIS — R262 Difficulty in walking, not elsewhere classified: Secondary | ICD-10-CM | POA: Diagnosis not present

## 2017-09-30 ENCOUNTER — Emergency Department
Admission: EM | Admit: 2017-09-30 | Discharge: 2017-10-01 | Disposition: A | Discharge status: Home / Self Care | Payer: Medicare Other | Attending: Emergency Medicine | Admitting: Emergency Medicine

## 2017-09-30 ENCOUNTER — Encounter: Payer: Self-pay | Admitting: Emergency Medicine

## 2017-09-30 DIAGNOSIS — Z7984 Long term (current) use of oral hypoglycemic drugs: Secondary | ICD-10-CM | POA: Insufficient documentation

## 2017-09-30 DIAGNOSIS — Z87891 Personal history of nicotine dependence: Secondary | ICD-10-CM | POA: Insufficient documentation

## 2017-09-30 DIAGNOSIS — I10 Essential (primary) hypertension: Secondary | ICD-10-CM | POA: Diagnosis not present

## 2017-09-30 DIAGNOSIS — N390 Urinary tract infection, site not specified: Secondary | ICD-10-CM | POA: Insufficient documentation

## 2017-09-30 DIAGNOSIS — Z79899 Other long term (current) drug therapy: Secondary | ICD-10-CM | POA: Insufficient documentation

## 2017-09-30 DIAGNOSIS — R410 Disorientation, unspecified: Secondary | ICD-10-CM | POA: Insufficient documentation

## 2017-09-30 DIAGNOSIS — E1140 Type 2 diabetes mellitus with diabetic neuropathy, unspecified: Secondary | ICD-10-CM | POA: Insufficient documentation

## 2017-09-30 LAB — COMPREHENSIVE METABOLIC PANEL
ALT: 15 U/L — AB (ref 17–63)
AST: 21 U/L (ref 15–41)
Albumin: 3.8 g/dL (ref 3.5–5.0)
Alkaline Phosphatase: 58 U/L (ref 38–126)
Anion gap: 12 (ref 5–15)
BUN: 23 mg/dL — ABNORMAL HIGH (ref 6–20)
CHLORIDE: 100 mmol/L — AB (ref 101–111)
CO2: 24 mmol/L (ref 22–32)
CREATININE: 1.4 mg/dL — AB (ref 0.61–1.24)
Calcium: 9.1 mg/dL (ref 8.9–10.3)
GFR calc non Af Amer: 48 mL/min — ABNORMAL LOW (ref >60)
GFR, EST AFRICAN AMERICAN: 56 mL/min — AB (ref >60)
Glucose, Bld: 239 mg/dL — ABNORMAL HIGH (ref 65–99)
Potassium: 3.8 mmol/L (ref 3.5–5.1)
SODIUM: 136 mmol/L (ref 135–145)
Total Bilirubin: 0.6 mg/dL (ref 0.3–1.2)
Total Protein: 7.4 g/dL (ref 6.5–8.1)

## 2017-09-30 LAB — CBC WITH DIFFERENTIAL/PLATELET
BASOS ABS: 0 10*3/uL (ref 0–0.1)
Basophils Relative: 0 %
Eosinophils Absolute: 0 10*3/uL (ref 0–0.7)
Eosinophils Relative: 0 %
HCT: 34.7 % — ABNORMAL LOW (ref 40.0–52.0)
Hemoglobin: 12.1 g/dL — ABNORMAL LOW (ref 13.0–18.0)
LYMPHS PCT: 10 %
Lymphs Abs: 1.2 10*3/uL (ref 1.0–3.6)
MCH: 31.2 pg (ref 26.0–34.0)
MCHC: 34.7 g/dL (ref 32.0–36.0)
MCV: 89.8 fL (ref 80.0–100.0)
MONO ABS: 1.4 10*3/uL — AB (ref 0.2–1.0)
MONOS PCT: 12 %
NEUTROS ABS: 9.2 10*3/uL — AB (ref 1.4–6.5)
NEUTROS PCT: 78 %
PLATELETS: 238 10*3/uL (ref 150–440)
RBC: 3.87 MIL/uL — AB (ref 4.40–5.90)
RDW: 13.2 % (ref 11.5–14.5)
WBC: 11.7 10*3/uL — ABNORMAL HIGH (ref 3.8–10.6)

## 2017-09-30 LAB — URINALYSIS, COMPLETE (UACMP) WITH MICROSCOPIC
Bilirubin Urine: NEGATIVE
Glucose, UA: NEGATIVE mg/dL
KETONES UR: NEGATIVE mg/dL
NITRITE: POSITIVE — AB
PROTEIN: 30 mg/dL — AB
Specific Gravity, Urine: 1.014 (ref 1.005–1.030)
Squamous Epithelial / LPF: NONE SEEN
pH: 5 (ref 5.0–8.0)

## 2017-09-30 LAB — LACTIC ACID, PLASMA: Lactic Acid, Venous: 2.4 mmol/L (ref 0.5–1.9)

## 2017-09-30 MED ORDER — SODIUM CHLORIDE 0.9 % IV BOLUS (SEPSIS)
1000.0000 mL | Freq: Once | INTRAVENOUS | Status: AC
  Administered 2017-10-01: 1000 mL via INTRAVENOUS

## 2017-09-30 MED ORDER — SODIUM CHLORIDE 0.9 % IV BOLUS (SEPSIS)
500.0000 mL | Freq: Once | INTRAVENOUS | Status: AC
  Administered 2017-09-30: 500 mL via INTRAVENOUS

## 2017-09-30 NOTE — ED Notes (Signed)
Pt given po fluids per MD request.
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
## 2017-09-30 NOTE — ED Triage Notes (Signed)
Wife reports that patient had AMS at home that started around 19:30 tonight. Wife also reports fever of 101.6 at home.

## 2017-09-30 NOTE — ED Notes (Signed)
Bladder scan shows 33ml will wait to collect UA

## 2017-10-01 DIAGNOSIS — R410 Disorientation, unspecified: Secondary | ICD-10-CM | POA: Diagnosis not present

## 2017-10-01 LAB — BLOOD CULTURE ID PANEL (REFLEXED)
Acinetobacter baumannii: NOT DETECTED
CANDIDA KRUSEI: NOT DETECTED
CANDIDA TROPICALIS: NOT DETECTED
CARBAPENEM RESISTANCE: NOT DETECTED
Candida albicans: NOT DETECTED
Candida glabrata: NOT DETECTED
Candida parapsilosis: NOT DETECTED
ENTEROCOCCUS SPECIES: NOT DETECTED
Enterobacter cloacae complex: NOT DETECTED
Enterobacteriaceae species: DETECTED — AB
Escherichia coli: NOT DETECTED
Haemophilus influenzae: NOT DETECTED
Klebsiella oxytoca: NOT DETECTED
Klebsiella pneumoniae: DETECTED — AB
LISTERIA MONOCYTOGENES: NOT DETECTED
NEISSERIA MENINGITIDIS: NOT DETECTED
Proteus species: NOT DETECTED
Pseudomonas aeruginosa: NOT DETECTED
STAPHYLOCOCCUS AUREUS BCID: NOT DETECTED
STAPHYLOCOCCUS SPECIES: NOT DETECTED
STREPTOCOCCUS AGALACTIAE: NOT DETECTED
STREPTOCOCCUS PNEUMONIAE: NOT DETECTED
Serratia marcescens: NOT DETECTED
Streptococcus pyogenes: NOT DETECTED
Streptococcus species: NOT DETECTED

## 2017-10-01 MED ORDER — CEFTRIAXONE SODIUM IN DEXTROSE 20 MG/ML IV SOLN
INTRAVENOUS | Status: AC
  Filled 2017-10-01: qty 50

## 2017-10-01 MED ORDER — CEFTRIAXONE SODIUM IN DEXTROSE 20 MG/ML IV SOLN
1.0000 g | Freq: Once | INTRAVENOUS | Status: AC
  Administered 2017-10-01: 1 g via INTRAVENOUS
  Filled 2017-10-01: qty 50

## 2017-10-01 MED ORDER — CEPHALEXIN 500 MG PO CAPS: 500.0000 mg | ORAL_CAPSULE | Freq: Four times a day (QID) | ORAL | 0 refills | Status: AC

## 2017-10-01 NOTE — Discharge Instructions (Signed)
It appears that you have a urinary tract infection. We were able to give you some fluids and a dose of IV antibiotics to help start with your treatment. Please take her antibiotics as prescribed and should you develop any fevers continued pain chills or more confusion please return to the emergency department. You may need to be admitted at that point for more IV antibiotics. Please follow-up with your primary care physician and please ensure that you are self cathetering at least one to 2 times daily.

## 2017-10-01 NOTE — ED Provider Notes (Signed)
-----------------------------------------   12:59 AM on 10/01/2017 -----------------------------------------   Blood pressure (!) 159/59, pulse 94, temperature 99.7 F (37.6 C), temperature source Oral, resp. rate (!) 28, height 6' (1.829 m), weight 124.7 kg (275 lb), SpO2 99 %.  Assuming care from Dr. Clearnce Hasten.  In short, Norman Palmer is a 76 y.o. male with a chief complaint of Altered Mental Status and Fever .  Refer to the original H&P for additional details.  The current plan of care is to follow up the results of the blood work and disposition the patient.      The patient's lactic acid did come back at 2.4 and his creatinine was 1.4. The patient's urine showed positive nitrates with too numerous to count red blood cells and too numerous to count white blood cells. The patient also has many bacteria.  I did give the patient a dose of ceftriaxone. The family does still feel confident that the patient can go home and will continue to monitor the patient at home. He will be discharged with Keflex but encouraged to return should he have any worsening symptoms or any other concerns. The patient has also been encouraged to self catheters one to 2 times daily to ensure that he is emptying his bladder and not creating any obstructions or increased chance of infection. The patient will be discharged to home.   Loney Hering, MD 10/01/17 [phone]

## 2017-10-02 ENCOUNTER — Telehealth: Payer: Self-pay | Admitting: Emergency Medicine

## 2017-10-02 ENCOUNTER — Encounter: Payer: Self-pay | Admitting: Family Medicine

## 2017-10-02 ENCOUNTER — Ambulatory Visit (INDEPENDENT_AMBULATORY_CARE_PROVIDER_SITE_OTHER): Payer: Medicare Other | Admitting: Family Medicine

## 2017-10-02 VITALS — BP 102/56 | HR 83 | Temp 98.4°F | Resp 16 | Wt 279.0 lb

## 2017-10-02 DIAGNOSIS — R7881 Bacteremia: Secondary | ICD-10-CM | POA: Diagnosis not present

## 2017-10-02 DIAGNOSIS — N390 Urinary tract infection, site not specified: Secondary | ICD-10-CM

## 2017-10-02 DIAGNOSIS — R319 Hematuria, unspecified: Secondary | ICD-10-CM | POA: Diagnosis not present

## 2017-10-02 DIAGNOSIS — R339 Retention of urine, unspecified: Secondary | ICD-10-CM | POA: Diagnosis not present

## 2017-10-02 LAB — POCT URINALYSIS DIPSTICK
BILIRUBIN UA: NEGATIVE
GLUCOSE UA: NEGATIVE
KETONES UA: NEGATIVE
NITRITE UA: NEGATIVE
Protein, UA: NEGATIVE
Spec Grav, UA: 1.01 (ref 1.010–1.025)
Urobilinogen, UA: 0.2 E.U./dL
pH, UA: 6 (ref 5.0–8.0)

## 2017-10-02 LAB — COMPLETE METABOLIC PANEL WITH GFR
AG RATIO: 1.3 (calc) (ref 1.0–2.5)
ALBUMIN MSPROF: 3.6 g/dL (ref 3.6–5.1)
ALT: 16 U/L (ref 9–46)
AST: 16 U/L (ref 10–35)
Alkaline phosphatase (APISO): 60 U/L (ref 40–115)
BILIRUBIN TOTAL: 0.4 mg/dL (ref 0.2–1.2)
BUN: 20 mg/dL (ref 7–25)
CALCIUM: 9.1 mg/dL (ref 8.6–10.3)
CO2: 26 mmol/L (ref 20–32)
Chloride: 102 mmol/L (ref 98–110)
Creat: 1.15 mg/dL (ref 0.70–1.18)
GFR, EST AFRICAN AMERICAN: 73 mL/min/{1.73_m2} (ref >=60)
GFR, EST NON AFRICAN AMERICAN: 63 mL/min/{1.73_m2} (ref >=60)
Globulin: 2.7 g/dL (calc) (ref 1.9–3.7)
Glucose, Bld: 163 mg/dL — ABNORMAL HIGH (ref 65–99)
POTASSIUM: 4.2 mmol/L (ref 3.5–5.3)
SODIUM: 138 mmol/L (ref 135–146)
TOTAL PROTEIN: 6.3 g/dL (ref 6.1–8.1)

## 2017-10-02 LAB — CBC
HCT: 34.4 % — ABNORMAL LOW (ref 38.5–50.0)
HEMOGLOBIN: 11.4 g/dL — AB (ref 13.2–17.1)
MCH: 30.1 pg (ref 27.0–33.0)
MCHC: 33.1 g/dL (ref 32.0–36.0)
MCV: 90.8 fL (ref 80.0–100.0)
MPV: 10.8 fL (ref 7.5–12.5)
Platelets: 254 10*3/uL (ref 140–400)
RBC: 3.79 10*6/uL — AB (ref 4.20–5.80)
RDW: 12.1 % (ref 11.0–15.0)
WBC: 9.7 10*3/uL (ref 3.8–10.8)

## 2017-10-02 MED ORDER — LEVOFLOXACIN 750 MG PO TABS: 750.0000 mg | ORAL_TABLET | Freq: Every day | ORAL | 0 refills | Status: DC

## 2017-10-02 NOTE — ED Provider Notes (Signed)
Nurse Kathlee Nations to speak with patient and Jonelle Sidle, patient's daughter for update.  Patient blood cultures returned positive for enterobacteria and klebsiella.  I reviewed chart, patient had discharged home feeling well to increase self caths and home on 10 days of keflex after iv dose of rocephin.  If feeling ok, follow up with pcp.  If any confusion, fever, abdominal pain, or any other concerns, return to ER for reevaluation recommended.   Lisa Roca, MD 10/02/17 [phone]
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
## 2017-10-02 NOTE — Progress Notes (Signed)
Patient: Norman Palmer Male    DOB: 12-Dec-1941   76 y.o.   MRN: [id number] Visit Date: 10/02/2017  Today's Provider: Lelon Huh, MD   Chief Complaint  Patient presents with  . Follow-up   Subjective:    HPI  Follow Up ER Visit  Patient is here for ER follow up.  He was recently seen at [facility] for UTI and confusion on 09/30/2017. Treatment for this included starting IV antibiotics. Patient was discharged home on Cephalexin. He was advised to self cath 1-2 times daily. Labs were ordered showing positive blood cultures for klebsiella Sensitivity for blood cultures still pending.  He reports good compliance with treatment. He reports this condition is resolved. Marland Kitchen His wife reports that prior to ER visit he had been cathing himself consistently and may have had some urinary retention.  Otherwise he is back to his baseline.        Allergies  Allergen Reactions  . Doxycycline Rash  . Zinc Rash     Current Outpatient Prescriptions:  .  celecoxib (CELEBREX) 200 MG capsule, Take 1 capsule by mouth daily., Disp: , Rfl:  .  cephALEXin (KEFLEX) 500 MG capsule, Take 1 capsule (500 mg total) by mouth 4 (four) times daily., Disp: 40 capsule, Rfl: 0 .  donepezil (ARICEPT) 10 MG tablet, Take 10 mg by mouth at bedtime. , Disp: , Rfl: 2 .  glipiZIDE-metformin (METAGLIP) 2.5-500 MG tablet, Take 1 tablet by mouth 2 (two) times daily before a meal. 1 Tablet 2 Times daily With Meals, Disp: 180 tablet, Rfl: 4 .  insulin NPH Human (HUMULIN N,NOVOLIN N) 100 UNIT/ML injection, Inject 30 Units into the skin every evening. , Disp: , Rfl:  .  lisinopril (PRINIVIL,ZESTRIL) 20 MG tablet, TAKE 1 TABLET EVERY DAY, Disp: 90 tablet, Rfl: 4 .  mupirocin ointment (BACTROBAN) 2 %, Place 1 application into the nose 2 (two) times daily., Disp: 22 g, Rfl: 0 .  Potassium 99 MG TABS, Take 1 tablet by mouth daily., Disp: , Rfl:  .  simvastatin (ZOCOR) 20 MG tablet, One tablet every evening. PATIENT NEEDS  TO SCHEDULE OFFICE VISIT FOR FOLLOW UP, Disp: 30 tablet, Rfl: 0 .  simvastatin (ZOCOR) 40 MG tablet, Take 40 mg by mouth every evening. , Disp: , Rfl:  .  tamsulosin (FLOMAX) 0.4 MG CAPS capsule, Take 1 capsule (0.4 mg total) by mouth daily., Disp: 30 capsule, Rfl: 6 .  triamterene-hydrochlorothiazide (DYAZIDE) 37.5-25 MG capsule, TAKE 1 CAPSULE BY MOUTH EVERY MORNING, Disp: 30 capsule, Rfl: 5 .  Vitamin D, Ergocalciferol, (DRISDOL) 50000 units CAPS capsule, TAKE 1 CAPSULE BY MOUTH ONCE A WEEK, Disp: 12 capsule, Rfl: 4 .  metFORMIN (GLUCOPHAGE) 1000 MG tablet, Take 1 tablet by mouth 2 (two) times daily., Disp: , Rfl:   Review of Systems  Constitutional: Negative for appetite change, chills and fever.  Respiratory: Negative for chest tightness, shortness of breath and wheezing.   Cardiovascular: Negative for chest pain and palpitations.  Gastrointestinal: Negative for abdominal pain, nausea and vomiting.    Social History  Substance Use Topics  . Smoking status: Former Smoker    Packs/day: 2.00    Years: 30.00    Types: Cigarettes  . Smokeless tobacco: Never Used  . Alcohol use 0.0 oz/week     Comment: occasionally   Objective:   BP (!) 102/56 (BP Location: Right Arm, Cuff Size: Large)   Pulse 83   Temp 98.4 F (36.9 C) (Oral)  Resp 16   Wt 279 lb (126.6 kg)   SpO2 96% Comment: room air  BMI 37.84 kg/m  Vitals:   10/02/17 1419 10/02/17 1421  BP: (!) 110/58 (!) 102/56  Pulse: 83   Resp: 16   Temp: 98.4 F (36.9 C)   TempSrc: Oral   SpO2: 96%   Weight: 279 lb (126.6 kg)      Physical Exam   General Appearance:    Alert, cooperative, no distress  Eyes:    PERRL, conjunctiva/corneas clear, EOM's intact       Lungs:     Clear to auscultation bilaterally, respirations unlabored  Heart:    Regular rate and rhythm  Neurologic:   Awake, alert, oriented x 3. No apparent focal neurological           defect.       Results for orders placed or performed in visit on  10/02/17  POCT Urinalysis Dipstick  Result Value Ref Range   Color, UA amber    Clarity, UA cloudy    Glucose, UA negative    Bilirubin, UA negative    Ketones, UA negative    Spec Grav, UA 1.010 1.010 - 1.025   Blood, UA Moderate (hemolyzed)    pH, UA 6.0 5.0 - 8.0   Protein, UA negative    Urobilinogen, UA 0.2 0.2 or 1.0 E.U./dL   Nitrite, UA negative    Leukocytes, UA Moderate (2+) (A) Negative       Assessment & Plan:     1. Urinary tract infection with hematuria, site unspecified Add levofloxacin as below. Counseled on importance on regular schedule bladder cathing to prevent retention and minimizerisk of UTI.  - POCT Urinalysis Dipstick - Urine Culture; Future - Urine Culture - CBC - Comprehensive metabolic panel  2. Bacteremia Currently asymptomatic. On cephalexin. Add levofloxacin due to klebsiella bacteremia.        Lelon Huh, MD  [facility]

## 2017-10-02 NOTE — Patient Instructions (Addendum)
   Reduce metaglip (glipizide-metformin) to ONE tablet daily until finished with levofloxacin

## 2017-10-03 LAB — URINE CULTURE
MICRO NUMBER: 81141348
RESULT: NO GROWTH
SPECIMEN QUALITY: ADEQUATE

## 2017-10-04 LAB — CULTURE, BLOOD (ROUTINE X 2)

## 2017-10-13 ENCOUNTER — Encounter: Payer: Self-pay | Admitting: Family Medicine

## 2017-10-13 ENCOUNTER — Ambulatory Visit (INDEPENDENT_AMBULATORY_CARE_PROVIDER_SITE_OTHER): Payer: Medicare Other | Admitting: Family Medicine

## 2017-10-13 VITALS — BP 118/58 | HR 73 | Temp 97.7°F | Resp 16 | Ht 72.0 in | Wt 279.0 lb

## 2017-10-13 DIAGNOSIS — N390 Urinary tract infection, site not specified: Secondary | ICD-10-CM

## 2017-10-13 DIAGNOSIS — Z23 Encounter for immunization: Secondary | ICD-10-CM

## 2017-10-13 DIAGNOSIS — R3911 Hesitancy of micturition: Secondary | ICD-10-CM | POA: Diagnosis not present

## 2017-10-13 DIAGNOSIS — R319 Hematuria, unspecified: Secondary | ICD-10-CM

## 2017-10-13 LAB — POCT URINALYSIS DIPSTICK
BILIRUBIN UA: NEGATIVE
Glucose, UA: NEGATIVE
KETONES UA: NEGATIVE
Nitrite, UA: NEGATIVE
PH UA: 6 (ref 5.0–8.0)
Protein, UA: NEGATIVE
Spec Grav, UA: 1.02 (ref 1.010–1.025)
Urobilinogen, UA: 0.2 E.U./dL

## 2017-10-13 MED ORDER — DUTASTERIDE 0.5 MG PO CAPS: 0.5000 mg | ORAL_CAPSULE | Freq: Every day | ORAL | 1 refills | Status: DC

## 2017-10-13 NOTE — Progress Notes (Signed)
Patient: Norman Palmer Male    DOB: 10-24-1941   76 y.o.   MRN: [id number] Visit Date: 10/13/2017  Today's Provider: Lelon Huh, MD   Chief Complaint  Patient presents with  . Follow-up   Subjective:    HPI  From 10/02/2017- seen for UTI. Had been started on cephalexin at ER visit 09/30/17 and added levofloxacin at 10/02/2017 visit.  Patient has completed levofloxacin and cephalexin. Follow up urine cultures were negative. States his symptoms have completely resolved and mental status is at baseline. Prior to UTI he had not been self cathing consistently, but since then his wife has been making sure it gets done. She states he is difficult to cath due to enlarged prostate and that it is uncomfortable for him.  Allergies  Allergen Reactions  . Doxycycline Rash  . Zinc Rash     Current Outpatient Prescriptions:  .  celecoxib (CELEBREX) 200 MG capsule, Take 1 capsule by mouth daily., Disp: , Rfl:  .  donepezil (ARICEPT) 10 MG tablet, Take 10 mg by mouth at bedtime. , Disp: , Rfl: 2 .  glipiZIDE-metformin (METAGLIP) 2.5-500 MG tablet, Take 1 tablet by mouth 2 (two) times daily before a meal. 1 Tablet 2 Times daily With Meals, Disp: 180 tablet, Rfl: 4 .  insulin NPH Human (HUMULIN N,NOVOLIN N) 100 UNIT/ML injection, Inject 30 Units into the skin every evening. , Disp: , Rfl:  .  lisinopril (PRINIVIL,ZESTRIL) 20 MG tablet, TAKE 1 TABLET EVERY DAY, Disp: 90 tablet, Rfl: 4 .  metFORMIN (GLUCOPHAGE) 1000 MG tablet, Take 1 tablet by mouth 2 (two) times daily., Disp: , Rfl:  .  Potassium 99 MG TABS, Take 1 tablet by mouth daily., Disp: , Rfl:  .  simvastatin (ZOCOR) 40 MG tablet, Take 40 mg by mouth every evening. , Disp: , Rfl:  .  tamsulosin (FLOMAX) 0.4 MG CAPS capsule, Take 1 capsule (0.4 mg total) by mouth daily., Disp: 30 capsule, Rfl: 6 .  triamterene-hydrochlorothiazide (DYAZIDE) 37.5-25 MG capsule, TAKE 1 CAPSULE BY MOUTH EVERY MORNING, Disp: 30 capsule, Rfl: 5 .   Vitamin D, Ergocalciferol, (DRISDOL) 50000 units CAPS capsule, TAKE 1 CAPSULE BY MOUTH ONCE A WEEK, Disp: 12 capsule, Rfl: 4  Review of Systems  Constitutional: Negative for appetite change, chills and fever.  Respiratory: Negative for chest tightness, shortness of breath and wheezing.   Cardiovascular: Negative for chest pain and palpitations.  Gastrointestinal: Negative for abdominal pain, nausea and vomiting.    Social History  Substance Use Topics  . Smoking status: Former Smoker    Packs/day: 2.00    Years: 30.00    Types: Cigarettes  . Smokeless tobacco: Never Used  . Alcohol use 0.0 oz/week     Comment: occasionally   Objective:   BP (!) 118/58 (BP Location: Right Arm, Patient Position: Sitting, Cuff Size: Large)   Pulse 73   Temp 97.7 F (36.5 C) (Oral)   Resp 16   Ht 6' (1.829 m)   Wt 279 lb (126.6 kg)   SpO2 98%   BMI 37.84 kg/m  Vitals:   10/13/17 1111  BP: (!) 118/58  Pulse: 73  Resp: 16  Temp: 97.7 F (36.5 C)  TempSrc: Oral  SpO2: 98%  Weight: 279 lb (126.6 kg)  Height: 6' (1.829 m)     Physical Exam   General Appearance:    Alert, cooperative, no distress  Eyes:    PERRL, conjunctiva/corneas clear, EOM's intact  Lungs:     Clear to auscultation bilaterally, respirations unlabored  Heart:    Regular rate and rhythm  Neurologic:   Awake, alert and answering questions appropriately. . No apparent focal neurological           defect.       Results for orders placed or performed in visit on 10/13/17  POCT urinalysis dipstick  Result Value Ref Range   Color, UA Yellow    Clarity, UA Clear    Glucose, UA Neg    Bilirubin, UA Neg    Ketones, UA Neg    Spec Grav, UA 1.020 1.010 - 1.025   Blood, UA Small-Hemolyzed    pH, UA 6.0 5.0 - 8.0   Protein, UA Neg    Urobilinogen, UA 0.2 0.2 or 1.0 E.U./dL   Nitrite, UA Neg    Leukocytes, UA Trace (A) Negative        Assessment & Plan:     1. Urinary tract infection with hematuria, site  unspecified Completely resolved discusses importance of emtying bladder at regular intervals.  - POCT urinalysis dipstick  2. Urinary hesitancy Requires prn cathing. Patient wife is concerned that prostate is impeding process of catheterization. Advised that this would likely need to be addressed with his urologist. She asks if there is anything I could do. I'm not sure if resistance is from prostate or from strictures, but she is interested in trying medication to reduce prostate size. Will try dutasteride 5mg  a day until follow up with urology. Advised it may take weeks or months for medication to have an effect.   3. Need for influenza vaccination  - Flu vaccine HIGH DOSE PF       Lelon Huh, MD  [facility]

## 2017-10-23 DIAGNOSIS — N99111 Postprocedural bulbous urethral stricture: Secondary | ICD-10-CM | POA: Diagnosis not present

## 2017-10-29 DIAGNOSIS — Z794 Long term (current) use of insulin: Secondary | ICD-10-CM | POA: Diagnosis not present

## 2017-10-29 DIAGNOSIS — R413 Other amnesia: Secondary | ICD-10-CM | POA: Diagnosis not present

## 2017-10-29 DIAGNOSIS — E1140 Type 2 diabetes mellitus with diabetic neuropathy, unspecified: Secondary | ICD-10-CM | POA: Diagnosis not present

## 2017-10-29 DIAGNOSIS — D649 Anemia, unspecified: Secondary | ICD-10-CM | POA: Diagnosis not present

## 2017-10-29 DIAGNOSIS — I10 Essential (primary) hypertension: Secondary | ICD-10-CM | POA: Diagnosis not present

## 2017-11-23 DIAGNOSIS — H35723 Serous detachment of retinal pigment epithelium, bilateral: Secondary | ICD-10-CM | POA: Diagnosis not present

## 2017-11-23 DIAGNOSIS — H2513 Age-related nuclear cataract, bilateral: Secondary | ICD-10-CM | POA: Diagnosis not present

## 2017-11-23 DIAGNOSIS — E119 Type 2 diabetes mellitus without complications: Secondary | ICD-10-CM | POA: Diagnosis not present

## 2017-11-23 DIAGNOSIS — H524 Presbyopia: Secondary | ICD-10-CM | POA: Diagnosis not present

## 2017-11-23 LAB — HM DIABETES EYE EXAM

## 2017-11-24 ENCOUNTER — Encounter: Payer: Self-pay | Admitting: *Deleted

## 2017-12-03 DIAGNOSIS — N401 Enlarged prostate with lower urinary tract symptoms: Secondary | ICD-10-CM | POA: Diagnosis not present

## 2017-12-03 DIAGNOSIS — Z6841 Body Mass Index (BMI) 40.0 and over, adult: Secondary | ICD-10-CM | POA: Diagnosis not present

## 2017-12-03 DIAGNOSIS — N99111 Postprocedural bulbous urethral stricture: Secondary | ICD-10-CM | POA: Diagnosis not present

## 2017-12-03 DIAGNOSIS — N138 Other obstructive and reflux uropathy: Secondary | ICD-10-CM | POA: Diagnosis not present

## 2017-12-03 DIAGNOSIS — R339 Retention of urine, unspecified: Secondary | ICD-10-CM | POA: Diagnosis not present

## 2017-12-09 ENCOUNTER — Other Ambulatory Visit: Payer: Self-pay | Admitting: Family Medicine

## 2017-12-09 DIAGNOSIS — I10 Essential (primary) hypertension: Secondary | ICD-10-CM

## 2018-02-01 ENCOUNTER — Telehealth: Payer: Self-pay | Admitting: Family Medicine

## 2018-03-15 ENCOUNTER — Ambulatory Visit: Payer: Medicare Other

## 2018-03-17 ENCOUNTER — Ambulatory Visit (INDEPENDENT_AMBULATORY_CARE_PROVIDER_SITE_OTHER): Payer: Medicare Other

## 2018-03-17 VITALS — BP 128/54 | HR 96 | Temp 99.3°F | Ht 72.0 in | Wt 282.8 lb

## 2018-03-17 DIAGNOSIS — Z0000 Encounter for general adult medical examination without abnormal findings: Secondary | ICD-10-CM

## 2018-03-17 NOTE — Patient Instructions (Signed)
[name] , Thank you for taking time to come for your Medicare Wellness Visit. I appreciate your ongoing commitment to your health goals. Please review the following plan we discussed and let me know if I can assist you in the future.   Screening recommendations/referrals: Colonoscopy: Up to date Recommended yearly ophthalmology/optometry visit for glaucoma screening and checkup Recommended yearly dental visit for hygiene and checkup  Vaccinations: Influenza vaccine: Up to date Pneumococcal vaccine: Up to date Tdap vaccine: Pt declines today.  Shingles vaccine: Up to date     Advanced directives: Please bring a copy of your POA (Power of [place]) and/or Living Will to your next appointment.   Conditions/risks identified: Obesity- Recommend continuing current diet regimen and avoiding all sugars in diet.  Next appointment: 03/23/18 @ 3:00 PM with Dr Caryn Section. Pt declined setting up AWV for 2020.  Preventive Care 75 Years and Older, Male Preventive care refers to lifestyle choices and visits with your health care provider that can promote health and wellness. What does preventive care include?  A yearly physical exam. This is also called an annual well check.  Dental exams once or twice a year.  Routine eye exams. Ask your health care provider how often you should have your eyes checked.  Personal lifestyle choices, including:  Daily care of your teeth and gums.  Regular physical activity.  Eating a healthy diet.  Avoiding tobacco and drug use.  Limiting alcohol use.  Practicing safe sex.  Taking low doses of aspirin every day.  Taking vitamin and mineral supplements as recommended by your health care provider. What happens during an annual well check? The services and screenings done by your health care provider during your annual well check will depend on your age, overall health, lifestyle risk factors, and family history of disease. Counseling  Your health care  provider may ask you questions about your:  Alcohol use.  Tobacco use.  Drug use.  Emotional well-being.  Home and relationship well-being.  Sexual activity.  Eating habits.  History of falls.  Memory and ability to understand (cognition).  Work and work Statistician. Screening  You may have the following tests or measurements:  Height, weight, and BMI.  Blood pressure.  Lipid and cholesterol levels. These may be checked every 5 years, or more frequently if you are over 10 years old.  Skin check.  Lung cancer screening. You may have this screening every year starting at age 44 if you have a 30-pack-year history of smoking and currently smoke or have quit within the past 15 years.  Fecal occult blood test (FOBT) of the stool. You may have this test every year starting at age 37.  Flexible sigmoidoscopy or colonoscopy. You may have a sigmoidoscopy every 5 years or a colonoscopy every 10 years starting at age 59.  Prostate cancer screening. Recommendations will vary depending on your family history and other risks.  Hepatitis C blood test.  Hepatitis B blood test.  Sexually transmitted disease (STD) testing.  Diabetes screening. This is done by checking your blood sugar (glucose) after you have not eaten for a while (fasting). You may have this done every 1-3 years.  Abdominal aortic aneurysm (AAA) screening. You may need this if you are a current or former smoker.  Osteoporosis. You may be screened starting at age 35 if you are at high risk. Talk with your health care provider about your test results, treatment options, and if necessary, the need for more tests. Vaccines  Your  health care provider may recommend certain vaccines, such as:  Influenza vaccine. This is recommended every year.  Tetanus, diphtheria, and acellular pertussis (Tdap, Td) vaccine. You may need a Td booster every 10 years.  Zoster vaccine. You may need this after age 53.  Pneumococcal  13-valent conjugate (PCV13) vaccine. One dose is recommended after age 19.  Pneumococcal polysaccharide (PPSV23) vaccine. One dose is recommended after age 65. Talk to your health care provider about which screenings and vaccines you need and how often you need them. This information is not intended to replace advice given to you by your health care provider. Make sure you discuss any questions you have with your health care provider. Document Released: 01/04/2016 Document Revised: 08/27/2016 Document Reviewed: 10/09/2015 Elsevier Interactive Patient Education  2017 [place] Prevention in the Home Falls can cause injuries. They can happen to people of all ages. There are many things you can do to make your home safe and to help prevent falls. What can I do on the outside of my home?  Regularly fix the edges of walkways and driveways and fix any cracks.  Remove anything that might make you trip as you walk through a door, such as a raised step or threshold.  Trim any bushes or trees on the path to your home.  Use bright outdoor lighting.  Clear any walking paths of anything that might make someone trip, such as rocks or tools.  Regularly check to see if handrails are loose or broken. Make sure that both sides of any steps have handrails.  Any raised decks and porches should have guardrails on the edges.  Have any leaves, snow, or ice cleared regularly.  Use sand or salt on walking paths during winter.  Clean up any spills in your garage right away. This includes oil or grease spills. What can I do in the bathroom?  Use night lights.  Install grab bars by the toilet and in the tub and shower. Do not use towel bars as grab bars.  Use non-skid mats or decals in the tub or shower.  If you need to sit down in the shower, use a plastic, non-slip stool.  Keep the floor dry. Clean up any water that spills on the floor as soon as it happens.  Remove soap buildup in the  tub or shower regularly.  Attach bath mats securely with double-sided non-slip rug tape.  Do not have throw rugs and other things on the floor that can make you trip. What can I do in the bedroom?  Use night lights.  Make sure that you have a light by your bed that is easy to reach.  Do not use any sheets or blankets that are too big for your bed. They should not hang down onto the floor.  Have a firm chair that has side arms. You can use this for support while you get dressed.  Do not have throw rugs and other things on the floor that can make you trip. What can I do in the kitchen?  Clean up any spills right away.  Avoid walking on wet floors.  Keep items that you use a lot in easy-to-reach places.  If you need to reach something above you, use a strong step stool that has a grab bar.  Keep electrical cords out of the way.  Do not use floor polish or wax that makes floors slippery. If you must use wax, use non-skid floor wax.  Do not  have throw rugs and other things on the floor that can make you trip. What can I do with my stairs?  Do not leave any items on the stairs.  Make sure that there are handrails on both sides of the stairs and use them. Fix handrails that are broken or loose. Make sure that handrails are as long as the stairways.  Check any carpeting to make sure that it is firmly attached to the stairs. Fix any carpet that is loose or worn.  Avoid having throw rugs at the top or bottom of the stairs. If you do have throw rugs, attach them to the floor with carpet tape.  Make sure that you have a light switch at the top of the stairs and the bottom of the stairs. If you do not have them, ask someone to add them for you. What else can I do to help prevent falls?  Wear shoes that:  Do not have high heels.  Have rubber bottoms.  Are comfortable and fit you well.  Are closed at the toe. Do not wear sandals.  If you use a stepladder:  Make sure that it  is fully opened. Do not climb a closed stepladder.  Make sure that both sides of the stepladder are locked into place.  Ask someone to hold it for you, if possible.  Clearly mark and make sure that you can see:  Any grab bars or handrails.  First and last steps.  Where the edge of each step is.  Use tools that help you move around (mobility aids) if they are needed. These include:  Canes.  Walkers.  Scooters.  Crutches.  Turn on the lights when you go into a dark area. Replace any light bulbs as soon as they burn out.  Set up your furniture so you have a clear path. Avoid moving your furniture around.  If any of your floors are uneven, fix them.  If there are any pets around you, be aware of where they are.  Review your medicines with your doctor. Some medicines can make you feel dizzy. This can increase your chance of falling. Ask your doctor what other things that you can do to help prevent falls. This information is not intended to replace advice given to you by your health care provider. Make sure you discuss any questions you have with your health care provider. Document Released: 10/04/2009 Document Revised: 05/15/2016 Document Reviewed: 01/12/2015 Elsevier Interactive Patient Education  2017 [employer].

## 2018-03-17 NOTE — Progress Notes (Signed)
Subjective:   Norman Palmer is a 77 y.o. male who presents for Medicare Annual/Subsequent preventive examination.  Review of Systems:  N/A  Cardiac Risk Factors include: advanced age (>2men, >63 women);diabetes mellitus;dyslipidemia;male gender;hypertension;obesity (BMI >30kg/m2)     Objective:    Vitals: BP (!) 128/54 (BP Location: Left Arm)   Pulse 96   Temp 99.3 F (37.4 C) (Oral)   Ht 6' (1.829 m)   Wt 282 lb 12.8 oz (128.3 kg)   BMI 38.35 kg/m   Body mass index is 38.35 kg/m.  Advanced Directives 03/17/2018 09/30/2017 03/13/2017 03/12/2017 12/25/2016 11/24/2016  Does Patient Have a Medical Advance Directive? Yes No Yes Yes Yes Yes  Type of Paramedic of [place];Living will - - [place];Living will [place];Living will [place];Living will  Copy of [place] in Chart? No - copy requested - - - - -    Tobacco Social History   Tobacco Use  Smoking Status Former Smoker  . Packs/day: 2.00  . Years: 30.00  . Pack years: 60.00  . Types: Cigarettes  Smokeless Tobacco Former Systems developer  Tobacco Comment   quit over 20 years ago     Counseling given: Not Answered Comment: quit over 20 years ago   Clinical Intake:  Pre-visit preparation completed: Yes  Pain : No/denies pain Pain Score: 0-No pain     Nutritional Status: BMI > 30  Obese Nutritional Risks: None Diabetes: Yes(type 2) CBG done?: No Did pt. bring in CBG monitor from home?: No  How often do you need to have someone help you when you read instructions, pamphlets, or other written materials from your doctor or pharmacy?: 1 - Never  Interpreter Needed?: No  Information entered by :: [facility], LPN  Past Medical History:  Diagnosis Date  . Diabetes mellitus without complication ([place])   . H/O knee surgery 04/17/2015   Overview:  Bilateral knees surgery   . Hyperlipemia   . Hypersomnia   .  Hypertension   . Insomnia   . Lumbago   . Myalgia   . Neuropathy   . Sciatica   . Thyroid disease    Past Surgical History:  Procedure Laterality Date  . knee surgery Bilateral 2000  . SKIN GRAFT Bilateral 1955   legs  . URETHRAL STRICTURE DILATATION     Family History  Problem Relation Age of Onset  . Hypertension Father   . Cancer Brother        Liver Cancer   Social History   Socioeconomic History  . Marital status: Married    Spouse name: Not on file  . Number of children: 3  . Years of education: MBA  . Highest education level: Master's degree (e.g., MA, MS, MEng, MEd, MSW, MBA)  Occupational History  . Occupation: retired  Scientific laboratory technician  . Financial resource strain: Not hard at all  . Food insecurity:    Worry: Never true    Inability: Never true  . Transportation needs:    Medical: No    Non-medical: No  Tobacco Use  . Smoking status: Former Smoker    Packs/day: 2.00    Years: 30.00    Pack years: 60.00    Types: Cigarettes  . Smokeless tobacco: Former Systems developer  . Tobacco comment: quit over 20 years ago  Substance and Sexual Activity  . Alcohol use: Yes    Alcohol/week: 0.6 - 1.2 oz    Types: 1 -  2 Cans of beer per week  . Drug use: No  . Sexual activity: Not on file  Lifestyle  . Physical activity:    Days per week: Not on file    Minutes per session: Not on file  . Stress: Not at all  Relationships  . Social connections:    Talks on phone: Not on file    Gets together: Not on file    Attends religious service: Not on file    Active member of club or organization: Not on file    Attends meetings of clubs or organizations: Not on file    Relationship status: Not on file  Other Topics Concern  . Not on file  Social History Narrative  . Not on file    Outpatient Encounter Medications as of 03/17/2018  Medication Sig  . donepezil (ARICEPT) 10 MG tablet Take 10 mg by mouth at bedtime.   . insulin NPH Human (HUMULIN N,NOVOLIN N) 100 UNIT/ML  injection Inject 30 Units into the skin every evening.   Marland Kitchen lisinopril (PRINIVIL,ZESTRIL) 20 MG tablet TAKE 1 TABLET EVERY DAY  . metFORMIN (GLUCOPHAGE) 1000 MG tablet Take 1 tablet by mouth 2 (two) times daily.  . simvastatin (ZOCOR) 40 MG tablet Take 40 mg by mouth every evening.   . triamterene-hydrochlorothiazide (DYAZIDE) 37.5-25 MG capsule TAKE 1 CAPSULE BY MOUTH EVERY MORNING  . Vitamin D, Ergocalciferol, (DRISDOL) 50000 units CAPS capsule TAKE 1 CAPSULE BY MOUTH ONCE A WEEK  . celecoxib (CELEBREX) 200 MG capsule Take 1 capsule by mouth daily.  Marland Kitchen dutasteride (AVODART) 0.5 MG capsule TAKE 1 CAPSULE BY MOUTH DAILY  . glipiZIDE-metformin (METAGLIP) 2.5-500 MG tablet Take 1 tablet by mouth 2 (two) times daily before a meal. 1 Tablet 2 Times daily With Meals (Patient not taking: Reported on 03/17/2018)  . Potassium 99 MG TABS Take 1 tablet by mouth daily.  . tamsulosin (FLOMAX) 0.4 MG CAPS capsule Take 1 capsule (0.4 mg total) by mouth daily. (Patient not taking: Reported on 03/17/2018)   No facility-administered encounter medications on file as of 03/17/2018.     Activities of Daily Living In your present state of health, do you have any difficulty performing the following activities: 03/17/2018  Hearing? N  Vision? N  Difficulty concentrating or making decisions? N  Walking or climbing stairs? N  Dressing or bathing? N  Doing errands, shopping? N  Preparing Food and eating ? N  Using the Toilet? N  In the past six months, have you accidently leaked urine? N  Do you have problems with loss of bowel control? N  Managing your Medications? N  Managing your Finances? N  Housekeeping or managing your Housekeeping? N  Some recent data might be hidden    Patient Care Team: Birdie Sons, MD as PCP - General (Family Medicine) Murrell Redden, MD (Urology) Paat, Darlina Rumpf, MD as Referring Physician (Internal Medicine) Thelma Comp, [place] as Consulting Physician (Optometry)     Assessment:   This is a routine wellness examination for [name]  Exercise Activities and Dietary recommendations Current Exercise Habits: The patient does not participate in regular exercise at present, Exercise limited by: None identified  Goals    . DIET - REDUCE SUGAR INTAKE     Recommend continuing current diet regimen and avoiding all sugars in diet.       Fall Risk Fall Risk  03/17/2018 03/13/2017 09/05/2015  Falls in the past year? No No No   Is the patient's home  free of loose throw rugs in walkways, pet beds, electrical cords, etc?   yes      Grab bars in the bathroom? no      Handrails on the stairs?   yes      Adequate lighting?   yes  Timed Get Up and Go Performed: N/A  Depression Screen PHQ 2/9 Scores 03/17/2018 03/13/2017 09/05/2015  PHQ - 2 Score 0 0 0  PHQ- 9 Score - 0 -    Cognitive Function : Pt declined screening today.         Immunization History  Administered Date(s) Administered  . Influenza Split 09/16/2011  . Influenza, High Dose Seasonal PF 10/06/2014, 09/05/2015, 10/07/2016, 10/13/2017  . Influenza-Unspecified 10/22/2013  . Pneumococcal Conjugate-13 05/02/2014  . Pneumococcal Polysaccharide-23 05/22/2009, 12/22/2010  . Tdap 06/09/2017  . Zoster 04/10/2010  . Zoster Recombinat (Shingrix) 03/01/2017    Qualifies for Shingles Vaccine?  Up to date per patient. Will contact pharmacy (total care) for record and updating.   Screening Tests Health Maintenance  Topic Date Due  . FOOT EXAM  11/12/1951  . HEMOGLOBIN A1C  07/29/2017  . OPHTHALMOLOGY EXAM  11/23/2018  . Fecal DNA (Cologuard)  04/24/2019  . TETANUS/TDAP  06/10/2027  . INFLUENZA VACCINE  Completed  . PNA vac Low Risk Adult  Completed   Cancer Screenings: Lung: Low Dose CT Chest recommended if Age 34-80 years, 30 pack-year currently smoking OR have quit w/in 15years. Patient does not qualify. Colorectal: Up to date  Additional Screenings:  Hepatitis C Screening: N/A    Plan:   I have personally reviewed and addressed the Medicare Annual Wellness questionnaire and have noted the following in the patient's chart:  A. Medical and social history B. Use of alcohol, tobacco or illicit drugs  C. Current medications and supplements D. Functional ability and status E.  Nutritional status F.  Physical activity G. Advance directives H. List of other physicians I.  Hospitalizations, surgeries, and ER visits in previous 12 months J.  [place] such as hearing and vision if needed, cognitive and depression L. Referrals and appointments - none  In addition, I have reviewed and discussed with patient certain preventive protocols, quality metrics, and best practice recommendations. A written personalized care plan for preventive services as well as general preventive health recommendations were provided to patient.  See attached scanned questionnaire for additional information.   Signed,  Fabio Neighbors, LPN Nurse Health Advisor   Nurse Recommendations: Pt needs a diabetic foot exam at next OV. Pt states his Hgb A1c was completed along with other BW at his last apt with Dr Sharlynn Oliphant at [facility]. Record release completed. Will fax over to obtain records and then update HM.

## 2018-03-23 ENCOUNTER — Ambulatory Visit: Payer: Medicare Other | Admitting: Family Medicine

## 2018-03-25 NOTE — Telephone Encounter (Signed)
complete

## 2018-04-06 ENCOUNTER — Other Ambulatory Visit: Payer: Self-pay | Admitting: Family Medicine

## 2018-04-06 DIAGNOSIS — I10 Essential (primary) hypertension: Secondary | ICD-10-CM

## 2018-04-14 ENCOUNTER — Telehealth: Payer: Self-pay

## 2018-04-14 NOTE — Telephone Encounter (Signed)
Spoke with Norman Palmer at Dr Paat's office and requested the date of patients last Hgb A1c and the results. Norman Palmer stated pt had this lab on 03/04/18 and the result was 7.3. FYI to PCP. -MM

## 2018-05-20 ENCOUNTER — Other Ambulatory Visit: Payer: Self-pay | Admitting: Family Medicine

## 2018-07-08 ENCOUNTER — Other Ambulatory Visit: Payer: Self-pay | Admitting: Family Medicine

## 2018-08-04 ENCOUNTER — Other Ambulatory Visit: Payer: Self-pay | Admitting: Family Medicine

## 2018-08-04 DIAGNOSIS — I10 Essential (primary) hypertension: Secondary | ICD-10-CM

## 2018-11-04 ENCOUNTER — Other Ambulatory Visit: Payer: Self-pay | Admitting: Family Medicine

## 2018-11-04 DIAGNOSIS — I10 Essential (primary) hypertension: Secondary | ICD-10-CM

## 2019-01-13 DIAGNOSIS — R0689 Other abnormalities of breathing: Secondary | ICD-10-CM | POA: Diagnosis not present

## 2019-01-13 DIAGNOSIS — R5383 Other fatigue: Secondary | ICD-10-CM | POA: Diagnosis not present

## 2019-01-13 DIAGNOSIS — E1140 Type 2 diabetes mellitus with diabetic neuropathy, unspecified: Secondary | ICD-10-CM | POA: Diagnosis not present

## 2019-01-13 DIAGNOSIS — R413 Other amnesia: Secondary | ICD-10-CM | POA: Diagnosis not present

## 2019-01-13 DIAGNOSIS — Z794 Long term (current) use of insulin: Secondary | ICD-10-CM | POA: Diagnosis not present

## 2019-01-13 DIAGNOSIS — R0600 Dyspnea, unspecified: Secondary | ICD-10-CM | POA: Diagnosis not present

## 2019-01-13 DIAGNOSIS — N401 Enlarged prostate with lower urinary tract symptoms: Secondary | ICD-10-CM | POA: Diagnosis not present

## 2019-01-13 DIAGNOSIS — I10 Essential (primary) hypertension: Secondary | ICD-10-CM | POA: Diagnosis not present

## 2019-01-13 DIAGNOSIS — R3914 Feeling of incomplete bladder emptying: Secondary | ICD-10-CM | POA: Diagnosis not present

## 2019-01-13 DIAGNOSIS — Z6841 Body Mass Index (BMI) 40.0 and over, adult: Secondary | ICD-10-CM | POA: Diagnosis not present

## 2019-01-16 ENCOUNTER — Other Ambulatory Visit: Payer: Self-pay | Admitting: Family Medicine

## 2019-01-16 DIAGNOSIS — I10 Essential (primary) hypertension: Secondary | ICD-10-CM

## 2019-02-19 ENCOUNTER — Other Ambulatory Visit: Payer: Self-pay | Admitting: Family Medicine

## 2019-03-04 ENCOUNTER — Telehealth: Payer: Self-pay | Admitting: Family Medicine

## 2019-03-04 NOTE — Telephone Encounter (Signed)
I called the patient to schedule AWV-S with Mckenzie.  I spoke with his wife, Santiago Glad, who said that she will call back.  If she calls back, please schedule AWV-S with McKenzie any time after 03/19/2019. VDM (DD)

## 2019-03-08 DIAGNOSIS — F0281 Dementia in other diseases classified elsewhere with behavioral disturbance: Secondary | ICD-10-CM | POA: Diagnosis not present

## 2019-03-08 DIAGNOSIS — I739 Peripheral vascular disease, unspecified: Secondary | ICD-10-CM | POA: Diagnosis not present

## 2019-03-15 DIAGNOSIS — R4689 Other symptoms and signs involving appearance and behavior: Secondary | ICD-10-CM | POA: Diagnosis not present

## 2019-03-15 DIAGNOSIS — R4189 Other symptoms and signs involving cognitive functions and awareness: Secondary | ICD-10-CM | POA: Diagnosis not present

## 2019-04-04 DIAGNOSIS — R413 Other amnesia: Secondary | ICD-10-CM | POA: Diagnosis not present

## 2019-04-08 DIAGNOSIS — Z794 Long term (current) use of insulin: Secondary | ICD-10-CM | POA: Diagnosis not present

## 2019-04-08 DIAGNOSIS — I10 Essential (primary) hypertension: Secondary | ICD-10-CM | POA: Diagnosis not present

## 2019-04-08 DIAGNOSIS — R413 Other amnesia: Secondary | ICD-10-CM | POA: Diagnosis not present

## 2019-04-08 DIAGNOSIS — E1140 Type 2 diabetes mellitus with diabetic neuropathy, unspecified: Secondary | ICD-10-CM | POA: Diagnosis not present

## 2019-04-08 DIAGNOSIS — N401 Enlarged prostate with lower urinary tract symptoms: Secondary | ICD-10-CM | POA: Diagnosis not present

## 2019-04-08 DIAGNOSIS — Z125 Encounter for screening for malignant neoplasm of prostate: Secondary | ICD-10-CM | POA: Diagnosis not present

## 2019-05-20 DIAGNOSIS — Z85828 Personal history of other malignant neoplasm of skin: Secondary | ICD-10-CM | POA: Diagnosis not present

## 2019-05-20 DIAGNOSIS — C44311 Basal cell carcinoma of skin of nose: Secondary | ICD-10-CM | POA: Diagnosis not present

## 2019-05-20 DIAGNOSIS — L538 Other specified erythematous conditions: Secondary | ICD-10-CM | POA: Diagnosis not present

## 2019-05-20 DIAGNOSIS — D485 Neoplasm of uncertain behavior of skin: Secondary | ICD-10-CM | POA: Diagnosis not present

## 2019-05-20 DIAGNOSIS — Z08 Encounter for follow-up examination after completed treatment for malignant neoplasm: Secondary | ICD-10-CM | POA: Diagnosis not present

## 2019-05-20 DIAGNOSIS — L820 Inflamed seborrheic keratosis: Secondary | ICD-10-CM | POA: Diagnosis not present

## 2019-05-20 DIAGNOSIS — X32XXXA Exposure to sunlight, initial encounter: Secondary | ICD-10-CM | POA: Diagnosis not present

## 2019-05-20 DIAGNOSIS — L570 Actinic keratosis: Secondary | ICD-10-CM | POA: Diagnosis not present

## 2019-06-01 ENCOUNTER — Other Ambulatory Visit: Payer: Self-pay | Admitting: Family Medicine

## 2019-06-01 DIAGNOSIS — I10 Essential (primary) hypertension: Secondary | ICD-10-CM

## 2019-07-04 ENCOUNTER — Other Ambulatory Visit: Payer: Self-pay | Admitting: Family Medicine

## 2019-07-11 ENCOUNTER — Other Ambulatory Visit: Payer: Self-pay | Admitting: Family Medicine

## 2019-07-14 DIAGNOSIS — E1140 Type 2 diabetes mellitus with diabetic neuropathy, unspecified: Secondary | ICD-10-CM | POA: Diagnosis not present

## 2019-07-14 DIAGNOSIS — Z794 Long term (current) use of insulin: Secondary | ICD-10-CM | POA: Diagnosis not present

## 2019-08-10 DIAGNOSIS — Z794 Long term (current) use of insulin: Secondary | ICD-10-CM | POA: Diagnosis not present

## 2019-08-10 DIAGNOSIS — E782 Mixed hyperlipidemia: Secondary | ICD-10-CM | POA: Diagnosis not present

## 2019-08-10 DIAGNOSIS — E1140 Type 2 diabetes mellitus with diabetic neuropathy, unspecified: Secondary | ICD-10-CM | POA: Diagnosis not present

## 2019-08-11 DIAGNOSIS — C44311 Basal cell carcinoma of skin of nose: Secondary | ICD-10-CM | POA: Diagnosis not present

## 2019-08-11 DIAGNOSIS — L814 Other melanin hyperpigmentation: Secondary | ICD-10-CM | POA: Diagnosis not present

## 2019-08-11 DIAGNOSIS — L988 Other specified disorders of the skin and subcutaneous tissue: Secondary | ICD-10-CM | POA: Diagnosis not present

## 2019-08-11 DIAGNOSIS — L578 Other skin changes due to chronic exposure to nonionizing radiation: Secondary | ICD-10-CM | POA: Diagnosis not present

## 2019-08-11 HISTORY — PX: MOHS SURGERY: SUR867

## 2019-08-12 ENCOUNTER — Encounter: Payer: Self-pay | Admitting: Family Medicine

## 2019-08-12 DIAGNOSIS — Z85828 Personal history of other malignant neoplasm of skin: Secondary | ICD-10-CM

## 2019-08-12 HISTORY — DX: Personal history of other malignant neoplasm of skin: Z85.828

## 2019-09-05 DIAGNOSIS — Z23 Encounter for immunization: Secondary | ICD-10-CM | POA: Diagnosis not present

## 2019-09-13 DIAGNOSIS — N401 Enlarged prostate with lower urinary tract symptoms: Secondary | ICD-10-CM | POA: Diagnosis not present

## 2019-09-13 DIAGNOSIS — Z87891 Personal history of nicotine dependence: Secondary | ICD-10-CM | POA: Diagnosis not present

## 2019-09-13 DIAGNOSIS — G4710 Hypersomnia, unspecified: Secondary | ICD-10-CM | POA: Diagnosis not present

## 2019-09-13 DIAGNOSIS — N35919 Unspecified urethral stricture, male, unspecified site: Secondary | ICD-10-CM | POA: Diagnosis not present

## 2019-09-13 DIAGNOSIS — E1140 Type 2 diabetes mellitus with diabetic neuropathy, unspecified: Secondary | ICD-10-CM | POA: Diagnosis not present

## 2019-09-13 DIAGNOSIS — E782 Mixed hyperlipidemia: Secondary | ICD-10-CM | POA: Diagnosis not present

## 2019-09-13 DIAGNOSIS — R413 Other amnesia: Secondary | ICD-10-CM | POA: Diagnosis not present

## 2019-09-13 DIAGNOSIS — G609 Hereditary and idiopathic neuropathy, unspecified: Secondary | ICD-10-CM | POA: Diagnosis not present

## 2019-09-13 DIAGNOSIS — R41841 Cognitive communication deficit: Secondary | ICD-10-CM | POA: Diagnosis not present

## 2019-09-13 DIAGNOSIS — Z9889 Other specified postprocedural states: Secondary | ICD-10-CM | POA: Diagnosis not present

## 2019-09-13 DIAGNOSIS — R3912 Poor urinary stream: Secondary | ICD-10-CM | POA: Diagnosis not present

## 2019-09-13 DIAGNOSIS — Z794 Long term (current) use of insulin: Secondary | ICD-10-CM | POA: Diagnosis not present

## 2019-09-13 DIAGNOSIS — G4733 Obstructive sleep apnea (adult) (pediatric): Secondary | ICD-10-CM | POA: Diagnosis not present

## 2019-09-13 DIAGNOSIS — R339 Retention of urine, unspecified: Secondary | ICD-10-CM | POA: Diagnosis not present

## 2019-10-21 DIAGNOSIS — D225 Melanocytic nevi of trunk: Secondary | ICD-10-CM | POA: Diagnosis not present

## 2019-10-21 DIAGNOSIS — D2262 Melanocytic nevi of left upper limb, including shoulder: Secondary | ICD-10-CM | POA: Diagnosis not present

## 2019-10-21 DIAGNOSIS — Z85828 Personal history of other malignant neoplasm of skin: Secondary | ICD-10-CM | POA: Diagnosis not present

## 2019-10-21 DIAGNOSIS — D2261 Melanocytic nevi of right upper limb, including shoulder: Secondary | ICD-10-CM | POA: Diagnosis not present

## 2019-10-21 DIAGNOSIS — Z08 Encounter for follow-up examination after completed treatment for malignant neoplasm: Secondary | ICD-10-CM | POA: Diagnosis not present

## 2019-10-21 DIAGNOSIS — D485 Neoplasm of uncertain behavior of skin: Secondary | ICD-10-CM | POA: Diagnosis not present

## 2019-10-21 DIAGNOSIS — L570 Actinic keratosis: Secondary | ICD-10-CM | POA: Diagnosis not present

## 2019-10-21 DIAGNOSIS — X32XXXA Exposure to sunlight, initial encounter: Secondary | ICD-10-CM | POA: Diagnosis not present

## 2019-10-21 DIAGNOSIS — C44319 Basal cell carcinoma of skin of other parts of face: Secondary | ICD-10-CM | POA: Diagnosis not present

## 2019-10-21 DIAGNOSIS — L821 Other seborrheic keratosis: Secondary | ICD-10-CM | POA: Diagnosis not present

## 2019-11-26 ENCOUNTER — Emergency Department: Payer: Medicare Other

## 2019-11-26 ENCOUNTER — Emergency Department
Admission: EM | Admit: 2019-11-26 | Discharge: 2019-11-26 | Disposition: A | Discharge status: Home / Self Care | Payer: Medicare Other | Attending: Emergency Medicine | Admitting: Emergency Medicine

## 2019-11-26 ENCOUNTER — Other Ambulatory Visit: Payer: Self-pay

## 2019-11-26 ENCOUNTER — Encounter: Payer: Self-pay | Admitting: Emergency Medicine

## 2019-11-26 DIAGNOSIS — Z79899 Other long term (current) drug therapy: Secondary | ICD-10-CM | POA: Insufficient documentation

## 2019-11-26 DIAGNOSIS — U071 COVID-19: Secondary | ICD-10-CM | POA: Diagnosis not present

## 2019-11-26 DIAGNOSIS — N179 Acute kidney failure, unspecified: Secondary | ICD-10-CM | POA: Insufficient documentation

## 2019-11-26 DIAGNOSIS — I10 Essential (primary) hypertension: Secondary | ICD-10-CM | POA: Insufficient documentation

## 2019-11-26 DIAGNOSIS — Z794 Long term (current) use of insulin: Secondary | ICD-10-CM | POA: Diagnosis not present

## 2019-11-26 DIAGNOSIS — R531 Weakness: Secondary | ICD-10-CM | POA: Diagnosis present

## 2019-11-26 DIAGNOSIS — R0602 Shortness of breath: Secondary | ICD-10-CM | POA: Diagnosis not present

## 2019-11-26 LAB — BASIC METABOLIC PANEL
Anion gap: 12 (ref 5–15)
BUN: 43 mg/dL — ABNORMAL HIGH (ref 8–23)
CO2: 18 mmol/L — ABNORMAL LOW (ref 22–32)
Calcium: 8.7 mg/dL — ABNORMAL LOW (ref 8.9–10.3)
Chloride: 106 mmol/L (ref 98–111)
Creatinine, Ser: 1.74 mg/dL — ABNORMAL HIGH (ref 0.61–1.24)
GFR calc Af Amer: 43 mL/min — ABNORMAL LOW (ref >60)
GFR calc non Af Amer: 37 mL/min — ABNORMAL LOW (ref >60)
Glucose, Bld: 182 mg/dL — ABNORMAL HIGH (ref 70–99)
Potassium: 4.1 mmol/L (ref 3.5–5.1)
Sodium: 136 mmol/L (ref 135–145)

## 2019-11-26 LAB — CBC
HCT: 34.2 % — ABNORMAL LOW (ref 39.0–52.0)
Hemoglobin: 11.1 g/dL — ABNORMAL LOW (ref 13.0–17.0)
MCH: 30 pg (ref 26.0–34.0)
MCHC: 32.5 g/dL (ref 30.0–36.0)
MCV: 92.4 fL (ref 80.0–100.0)
Platelets: 164 10*3/uL (ref 150–400)
RBC: 3.7 MIL/uL — ABNORMAL LOW (ref 4.22–5.81)
RDW: 12.4 % (ref 11.5–15.5)
WBC: 5.6 10*3/uL (ref 4.0–10.5)
nRBC: 0 % (ref 0.0–0.2)

## 2019-11-26 LAB — HEPATIC FUNCTION PANEL
ALT: 27 U/L (ref 0–44)
AST: 35 U/L (ref 15–41)
Albumin: 3.5 g/dL (ref 3.5–5.0)
Alkaline Phosphatase: 48 U/L (ref 38–126)
Bilirubin, Direct: <0.1 mg/dL (ref 0.0–0.2)
Total Bilirubin: 0.5 mg/dL (ref 0.3–1.2)
Total Protein: 6.8 g/dL (ref 6.5–8.1)

## 2019-11-26 LAB — TROPONIN I (HIGH SENSITIVITY): Troponin I (High Sensitivity): 17 ng/L (ref <18)

## 2019-11-26 LAB — T4, FREE: Free T4: 0.98 ng/dL (ref 0.61–1.12)

## 2019-11-26 LAB — TSH: TSH: 1.064 u[IU]/mL (ref 0.350–4.500)

## 2019-11-26 LAB — GLUCOSE, CAPILLARY: Glucose-Capillary: 176 mg/dL — ABNORMAL HIGH (ref 70–99)

## 2019-11-26 LAB — POC SARS CORONAVIRUS 2 AG: SARS Coronavirus 2 Ag: POSITIVE — AB

## 2019-11-26 IMAGING — DX DG CHEST 1V PORT
1 series · 1 of 1 positions shown · non-contrast
Comparison: Report from chest radiograph dated [DATE]

CLINICAL DATA: Shortness of breath

EXAM:
PORTABLE CHEST 1 VIEW

[chest ap]
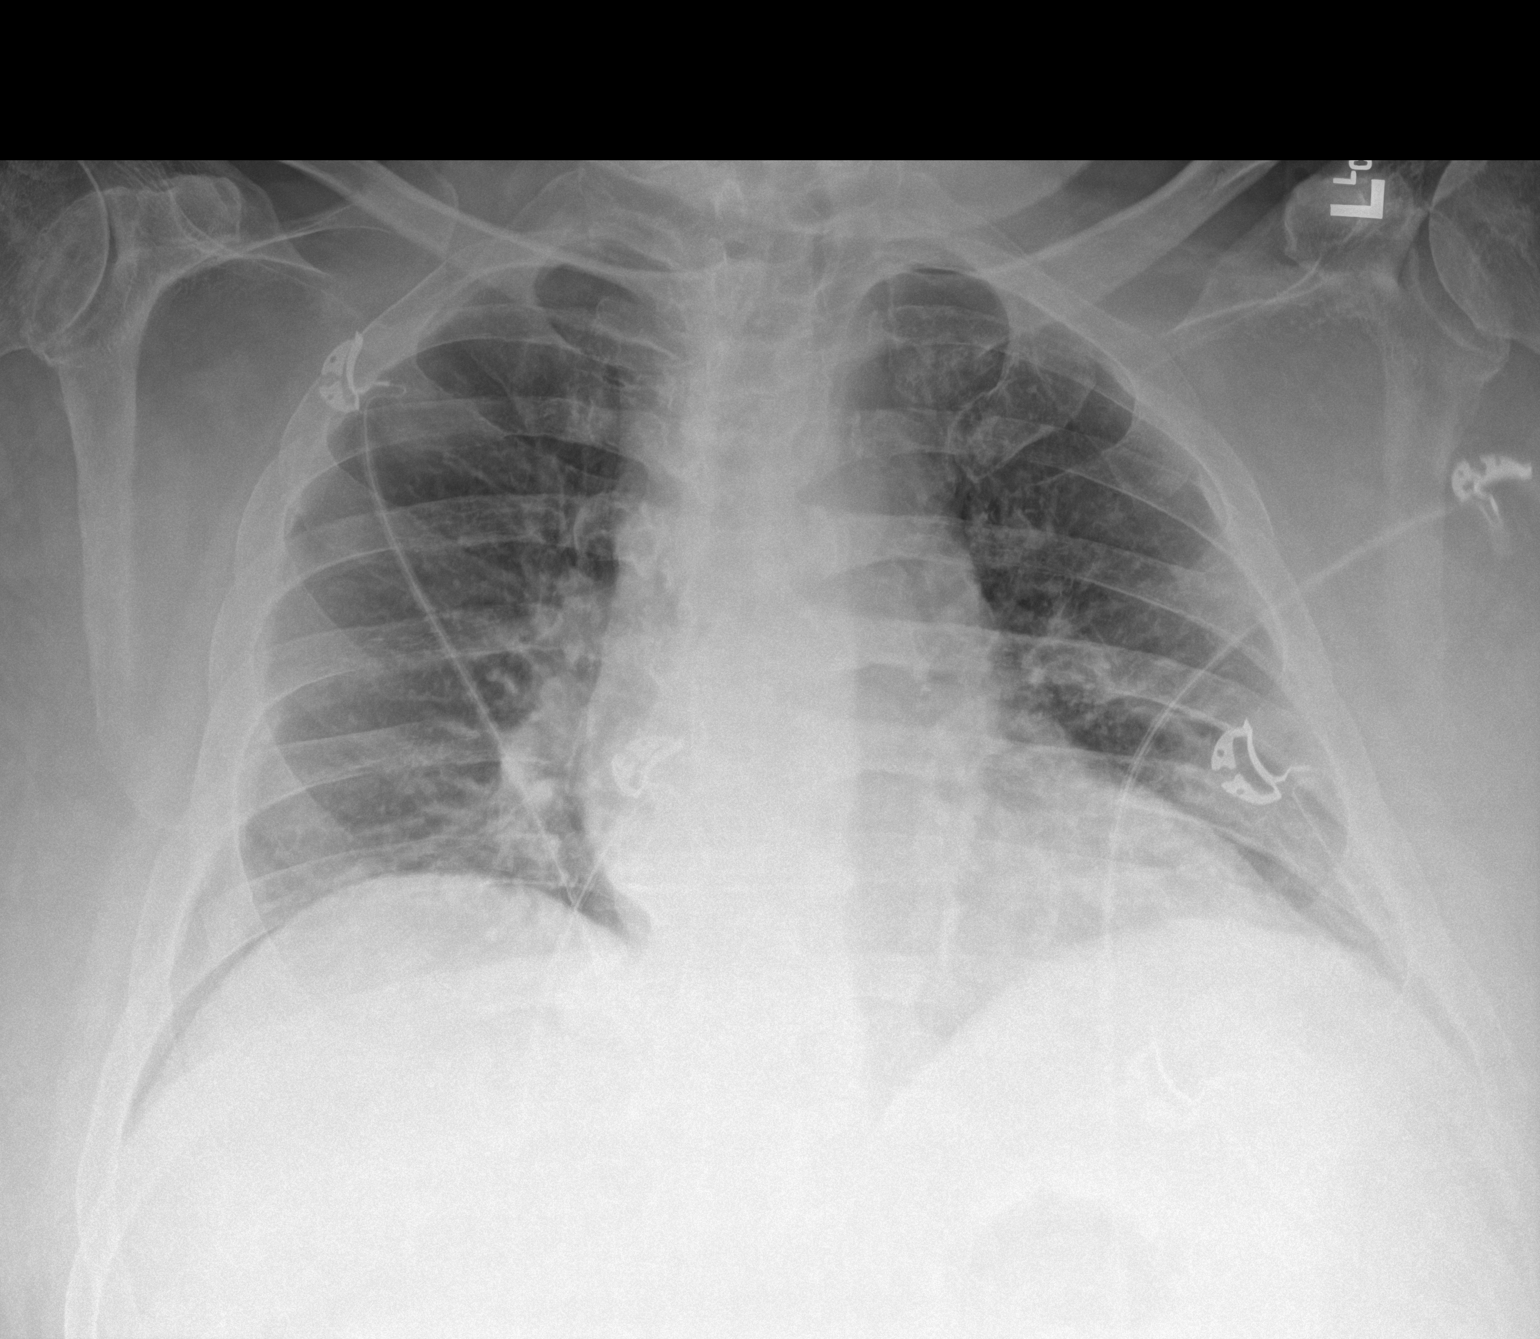

[1 of 1 positions shown; findings below may reference images not displayed]

FINDINGS: The heart size and mediastinal contours are within normal limits.
Interstitial prominence may represent vascular crowding or pulmonary
edema. There is no pleural effusion or pneumothorax. Degenerative
changes are seen in the shoulders and spine.
IMPRESSION: 1. Interstitial prominence may represent vascular crowding or
pulmonary edema.

## 2019-11-26 MED ORDER — SODIUM CHLORIDE 0.9 % IV BOLUS
500.0000 mL | Freq: Once | INTRAVENOUS | Status: AC
  Administered 2019-11-26: 500 mL via INTRAVENOUS

## 2019-11-26 NOTE — ED Triage Notes (Addendum)
Pt arrived via POV with reports of increased weakness worse since Wednesday, family also reports low grade fevers at home, had an episode of incontinence yesterday as well.  Pt has hx of dementia. Pt unable to get up to seated position yesterday.  Pt's son positive for COVID, pt used to self-cath and has hx of prostate problems.  Pt has been choking when eating as well.

## 2019-11-26 NOTE — Discharge Instructions (Addendum)
There was evidence of coronavirus today.  He needs to stay quarantined at home.  He also has a little bit of evidence of dehydration with an elevated kidney function of 1.7.  We did give him a little bit of fluids.  Some important for him to take lots of p.o. hydration.  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  If he appears more short of breath he needs to return for oxygen level rechecked.  Otherwise he can follow-up with his primary care doctor for kidney function recheck.  Return to the ER for any other concerns.  To note he had a lot of difficulty with ambulating so we discussed admission for this.  I suspect this is from the coronavirus however if is not getting better after his coronavirus gets better he may need additional care at home to help facilitate his ADLs.     Person Under Monitoring Name: Norman Palmer  Location: [place] [address]   Infection Prevention Recommendations for Individuals Confirmed to have, or Being Evaluated for, 2019 Novel Coronavirus (COVID-19) Infection Who Receive Care at Home  Individuals who are confirmed to have, or are being evaluated for, COVID-19 should follow the prevention steps below until a healthcare provider or local or state health department says they can return to normal activities.  Stay home except to get medical care You should restrict activities outside your home, except for getting medical care. Do not go to work, school, or public areas, and do not use public transportation or taxis.  Call ahead before visiting your doctor Before your medical appointment, call the healthcare provider and tell them that you have, or are being evaluated for, COVID-19 infection. This will help the healthcare providers office take steps to keep other people from getting infected. Ask your healthcare provider to call the local or state health department.  Monitor your  symptoms Seek prompt medical attention if your illness is worsening (e.g., difficulty breathing). Before going to your medical appointment, call the healthcare provider and tell them that you have, or are being evaluated for, COVID-19 infection. Ask your healthcare provider to call the local or state health department.  Wear a facemask You should wear a facemask that covers your nose and mouth when you are in the same room with other people and when you visit a healthcare provider. People who live with or visit you should also wear a facemask while they are in the same room with you.  Separate yourself from other people in your home As much as possible, you should stay in a different room from other people in your home. Also, you should use a separate bathroom, if available.  Avoid sharing household items You should not share dishes, drinking glasses, cups, eating utensils, towels, bedding, or other items with other people in your home. After using these items, you should wash them thoroughly with soap and water.  Cover your coughs and sneezes Cover your mouth and nose with a tissue when you cough or sneeze, or you can cough or sneeze into your sleeve. Throw used tissues in a lined trash can, and immediately wash your hands with soap and water for at least 20 seconds or use an alcohol-based hand rub.  Wash your [facility] your hands often and thoroughly with soap and water for at least 20 seconds. You can use an alcohol-based hand sanitizer if soap and water are not available and  if your hands are not visibly dirty. Avoid touching your eyes, nose, and mouth with unwashed hands.   Prevention Steps for Caregivers and Household Members of Individuals Confirmed to have, or Being Evaluated for, COVID-19 Infection Being Cared for in the Home  If you live with, or provide care at home for, a person confirmed to have, or being evaluated for, COVID-19 infection please follow these guidelines  to prevent infection:  Follow healthcare providers instructions Make sure that you understand and can help the patient follow any healthcare provider instructions for all care.  Provide for the patients basic needs You should help the patient with basic needs in the home and provide support for getting groceries, prescriptions, and other personal needs.  Monitor the patients symptoms If they are getting sicker, call his or her medical provider and tell them that the patient has, or is being evaluated for, COVID-19 infection. This will help the healthcare providers office take steps to keep other people from getting infected. Ask the healthcare provider to call the local or state health department.  Limit the number of people who have contact with the patient If possible, have only one caregiver for the patient. Other household members should stay in another home or place of residence. If this is not possible, they should stay in another room, or be separated from the patient as much as possible. Use a separate bathroom, if available. Restrict visitors who do not have an essential need to be in the home.  Keep older adults, very young children, and other sick people away from the patient Keep older adults, very young children, and those who have compromised immune systems or chronic health conditions away from the patient. This includes people with chronic heart, lung, or kidney conditions, diabetes, and cancer.  Ensure good ventilation Make sure that shared spaces in the home have good air flow, such as from an air conditioner or an opened window, weather permitting.  Wash your hands often Wash your hands often and thoroughly with soap and water for at least 20 seconds. You can use an alcohol based hand sanitizer if soap and water are not available and if your hands are not visibly dirty. Avoid touching your eyes, nose, and mouth with unwashed hands. Use disposable paper towels to  dry your hands. If not available, use dedicated cloth towels and replace them when they become wet.  Wear a facemask and gloves Wear a disposable facemask at all times in the room and gloves when you touch or have contact with the patients blood, body fluids, and/or secretions or excretions, such as sweat, saliva, sputum, nasal mucus, vomit, urine, or feces.  Ensure the mask fits over your nose and mouth tightly, and do not touch it during use. Throw out disposable facemasks and gloves after using them. Do not reuse. Wash your hands immediately after removing your facemask and gloves. If your personal clothing becomes contaminated, carefully remove clothing and launder. Wash your hands after handling contaminated clothing. Place all used disposable facemasks, gloves, and other waste in a lined container before disposing them with other household waste. Remove gloves and wash your hands immediately after handling these items.  Do not share dishes, glasses, or other household items with the patient Avoid sharing household items. You should not share dishes, drinking glasses, cups, eating utensils, towels, bedding, or other items with a patient who is confirmed to have, or being evaluated for, COVID-19 infection. After the person uses these items, you should wash them  thoroughly with soap and water.  Wash laundry thoroughly Immediately remove and wash clothes or bedding that have blood, body fluids, and/or secretions or excretions, such as sweat, saliva, sputum, nasal mucus, vomit, urine, or feces, on them. Wear gloves when handling laundry from the patient. Read and follow directions on labels of laundry or clothing items and detergent. In general, wash and dry with the warmest temperatures recommended on the label.  Clean all areas the individual has used often Clean all touchable surfaces, such as counters, tabletops, doorknobs, bathroom fixtures, toilets, phones, keyboards, tablets, and bedside  tables, every day. Also, clean any surfaces that may have blood, body fluids, and/or secretions or excretions on them. Wear gloves when cleaning surfaces the patient has come in contact with. Use a diluted bleach solution (e.g., dilute bleach with 1 part bleach and 10 parts water) or a household disinfectant with a label that says EPA-registered for coronaviruses. To make a bleach solution at home, add 1 tablespoon of bleach to 1 quart (4 cups) of water. For a larger supply, add  cup of bleach to 1 gallon (16 cups) of water. Read labels of cleaning products and follow recommendations provided on product labels. Labels contain instructions for safe and effective use of the cleaning product including precautions you should take when applying the product, such as wearing gloves or eye protection and making sure you have good ventilation during use of the product. Remove gloves and wash hands immediately after cleaning.  Monitor yourself for signs and symptoms of illness Caregivers and household members are considered close contacts, should monitor their health, and will be asked to limit movement outside of the home to the extent possible. Follow the monitoring steps for close contacts listed on the symptom monitoring form.   ? If you have additional questions, contact your local health department or call the epidemiologist on call at [phone] (available 24/7). ? This guidance is subject to change. For the most up-to-date guidance from [facility], please refer to their website: YouBlogs.pl
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
## 2019-11-30 ENCOUNTER — Telehealth: Payer: Self-pay | Admitting: *Deleted

## 2019-11-30 NOTE — Telephone Encounter (Signed)
Dr. Caryn Section, per ER discharge summary, patient should follow up in 1 week to recheck BNP. Since he tested positive for COVID, should patient do a virtual visit with you and then have his labs done at the outpatient hospital lab? I reserved the 2:20pm slot  for this patient to do a virtual visit. Is this ok.

## 2019-11-30 NOTE — Telephone Encounter (Signed)
Source Subject Topic  Norman Palmer, Norman M "JAY" (Patient) Norman Hidden M "JAY" (Patient) Appointment Scheduling - Scheduling Inquiry for Clinic  Summary: appt  Reason for CRM:pt was discharge from [facility] on sat 11-26-2019 and need bmp lab in one week. Pt has covid 19. Pt also needs appt with dr Caryn Section

## 2019-12-01 ENCOUNTER — Encounter: Payer: Self-pay | Admitting: Family Medicine

## 2019-12-01 NOTE — Telephone Encounter (Signed)
Patient advised of appointment day and time and agrees to telephone visit (he doesn't have Internet for virtual visit). I called and spoke with [facility] lab and was advised that patients who have tested positive for COVID can come to the lab to get blood work done. The patient will have to notify the tech upon arrival so that they take him back to a room immediately. Patient was advised of these instructions and verbally voiced understanding. I advised patient that we would order labs after his telephone visit tomorrow.

## 2019-12-01 NOTE — Progress Notes (Signed)
Patient: Norman Palmer Male    DOB: 01-15-1941   78 y.o.   MRN: [id number] Visit Date: 12/01/2019  Today's Provider: Lelon Huh, MD   Chief Complaint  Patient presents with  . Follow-up   Subjective:    Virtual Visit via Video Note  I connected with Norman Palmer on 12/04/19 at  2:20 PM EST by a video enabled telemedicine application and verified that I am speaking with the correct person using two identifiers.   I discussed the limitations of evaluation and management by telemedicine and the availability of in person appointments. The patient expressed understanding and agreed to proceed.    HPI  Follow up ER visit  Patient was seen in ER for weakness on 11/26/2019. He was treated for positive COVID-19 virus and AKI. He reports good compliance with treatment. He reports this condition is Improved. Patient has poor memory and is very poor historian and states he felt fine and doesn't know why he was taken to the Emergency Room  Discussed with his wife Norman Palmer who reports patient was extremely fatigued and could barely walk or stand on date of ER visit, and was short of breath with coughin. He was noted to have elevated cr=1.7 and BUN of 43 with a hemoglobin of 11.1.  He was given IV fluids and sent home to quarantine. His wife states he is still weak, but much improved from date of ER visit. He was advised to have labs repeated in a week, but she states he is still too weak to get out of the house. He has not had any fevers, chills or sweats since ER visit.  ------------------------------------------------------------------------------------   Allergies  Allergen Reactions  . Doxycycline Rash  . Zinc Rash     Current Outpatient Medications:  .  celecoxib (CELEBREX) 200 MG capsule, Take 200 mg by mouth daily. , Disp: , Rfl:  .  donepezil (ARICEPT) 10 MG tablet, Take 10 mg by mouth at bedtime. , Disp: , Rfl: 2 .  dutasteride (AVODART) 0.5 MG capsule, TAKE 1  CAPSULE BY MOUTH ONCE DAILY (Patient taking differently: Take 0.5 mg by mouth daily. ), Disp: 30 capsule, Rfl: 5 .  glipiZIDE-metformin (METAGLIP) 2.5-500 MG tablet, Take 1 tablet by mouth 2 (two) times daily before a meal. 1 Tablet 2 Times daily With Meals, Disp: 180 tablet, Rfl: 4 .  lisinopril (PRINIVIL,ZESTRIL) 20 MG tablet, TAKE 1 TABLET BY MOUTH DAILY (Patient taking differently: Take 20 mg by mouth daily. ), Disp: 90 tablet, Rfl: 3 .  metFORMIN (GLUCOPHAGE) 1000 MG tablet, TAKE ONE TABLET TWICE DAILY WITH MEALS (Patient taking differently: Take 1,000 mg by mouth 2 (two) times daily with a meal. ), Disp: 180 tablet, Rfl: 5 .  Potassium 99 MG TABS, Take 1 tablet by mouth daily., Disp: , Rfl:  .  pregabalin (LYRICA) 150 MG capsule, Take 150 mg by mouth daily., Disp: , Rfl:  .  sertraline (ZOLOFT) 50 MG tablet, Take 50 mg by mouth daily., Disp: , Rfl:  .  simvastatin (ZOCOR) 40 MG tablet, Take 40 mg by mouth every evening. , Disp: , Rfl:  .  tamsulosin (FLOMAX) 0.4 MG CAPS capsule, Take 1 capsule (0.4 mg total) by mouth daily., Disp: 30 capsule, Rfl: 6 .  triamterene-hydrochlorothiazide (DYAZIDE) 37.5-25 MG capsule, TAKE 1 CAPSULE EVERY MORNING, Disp: 30 capsule, Rfl: 5 .  Vitamin D, Ergocalciferol, (DRISDOL) 1.25 MG (50000 UT) CAPS capsule, TAKE 1 CAPSULE BY MOUTH EACH WEEK, Disp:  12 capsule, Rfl: 4 .  insulin NPH Human (HUMULIN N,NOVOLIN N) 100 UNIT/ML injection, Inject 30 Units into the skin every evening. , Disp: , Rfl:   Review of Systems  Constitutional: Negative for appetite change, chills and fever.  Respiratory: Negative for chest tightness, shortness of breath and wheezing.   Cardiovascular: Negative for chest pain and palpitations.  Gastrointestinal: Negative for abdominal pain, nausea and vomiting.  Neurological: Positive for weakness (improved).    Social History   Tobacco Use  . Smoking status: Former Smoker    Packs/day: 2.00    Years: 30.00    Pack years: 60.00     Types: Cigarettes  . Smokeless tobacco: Former Systems developer  . Tobacco comment: quit over 20 years ago  Substance Use Topics  . Alcohol use: Yes    Alcohol/week: 1.0 - 2.0 standard drinks    Types: 1 - 2 Cans of beer per week      Objective:    Patient awake and seemingly alert over the phone, but oriented only to person and place. No indications of dyspnea.      Assessment & Plan    1. COVID-19 So far only mild symptoms, much improved since ER visit 1 week ago. Encouraged to push fluids as tolerated and to remain quarantined for 7 more days.   2. Acute kidney injury ([place]) Secondary to viral illness. Improved with IV fluids last week. Will go to hospital labs on 12/05/2019 - Comprehensive metabolic panel - CBC  Patient given instructions by CMA for protocol for Covid patients to get labs drawn.   3. Anemia, unspecified type  - Comprehensive metabolic panel - CBC   I discussed the assessment and treatment plan with the patient. The patient was provided an opportunity to ask questions and all were answered. The patient agreed with the plan and demonstrated an understanding of the instructions.   The patient was advised to call back or seek an in-person evaluation if the symptoms worsen or if the condition fails to improve as anticipated.  I provided 12 minutes of non-face-to-face time during this encounter.      Lelon Huh, MD  [facility]

## 2019-12-01 NOTE — Telephone Encounter (Signed)
That's fine, we'll keep the virtual appointment tomorrow and will then arrange for labs. Can you call the [facility] lab and see if they have policy about whether covid positive labs can have labs drawn or how long after a positive test before they can have patient's come in? Thanks.

## 2019-12-02 ENCOUNTER — Other Ambulatory Visit: Payer: Self-pay

## 2019-12-02 ENCOUNTER — Ambulatory Visit (INDEPENDENT_AMBULATORY_CARE_PROVIDER_SITE_OTHER): Payer: Medicare Other | Admitting: Family Medicine

## 2019-12-02 DIAGNOSIS — U071 COVID-19: Secondary | ICD-10-CM | POA: Diagnosis not present

## 2019-12-02 DIAGNOSIS — N179 Acute kidney failure, unspecified: Secondary | ICD-10-CM | POA: Diagnosis not present

## 2019-12-02 DIAGNOSIS — D649 Anemia, unspecified: Secondary | ICD-10-CM

## 2019-12-20 DIAGNOSIS — E559 Vitamin D deficiency, unspecified: Secondary | ICD-10-CM | POA: Diagnosis not present

## 2019-12-20 DIAGNOSIS — R2689 Other abnormalities of gait and mobility: Secondary | ICD-10-CM | POA: Diagnosis not present

## 2019-12-20 DIAGNOSIS — E1140 Type 2 diabetes mellitus with diabetic neuropathy, unspecified: Secondary | ICD-10-CM | POA: Diagnosis not present

## 2019-12-20 DIAGNOSIS — G3109 Other frontotemporal dementia: Secondary | ICD-10-CM | POA: Diagnosis not present

## 2019-12-20 DIAGNOSIS — Z794 Long term (current) use of insulin: Secondary | ICD-10-CM | POA: Diagnosis not present

## 2019-12-20 DIAGNOSIS — U071 COVID-19: Secondary | ICD-10-CM | POA: Diagnosis not present

## 2019-12-20 DIAGNOSIS — I10 Essential (primary) hypertension: Secondary | ICD-10-CM | POA: Diagnosis not present

## 2019-12-20 DIAGNOSIS — Z87891 Personal history of nicotine dependence: Secondary | ICD-10-CM | POA: Diagnosis not present

## 2019-12-20 DIAGNOSIS — G4733 Obstructive sleep apnea (adult) (pediatric): Secondary | ICD-10-CM | POA: Diagnosis not present

## 2019-12-20 DIAGNOSIS — Z6838 Body mass index (BMI) 38.0-38.9, adult: Secondary | ICD-10-CM | POA: Diagnosis not present

## 2019-12-20 DIAGNOSIS — F0280 Dementia in other diseases classified elsewhere without behavioral disturbance: Secondary | ICD-10-CM | POA: Diagnosis not present

## 2019-12-27 DIAGNOSIS — C44319 Basal cell carcinoma of skin of other parts of face: Secondary | ICD-10-CM | POA: Diagnosis not present

## 2020-01-14 ENCOUNTER — Other Ambulatory Visit: Payer: Self-pay | Admitting: Family Medicine

## 2020-01-14 DIAGNOSIS — I10 Essential (primary) hypertension: Secondary | ICD-10-CM

## 2020-01-14 NOTE — Telephone Encounter (Signed)
Requested Prescriptions  Pending Prescriptions Disp Refills  . triamterene-hydrochlorothiazide (DYAZIDE) 37.5-25 MG capsule [Pharmacy Med Name: TRIAMTERENE-HCTZ 37.5-25 MG CAP] 30 capsule 5    Sig: TAKE 1 CAPSULE BY MOUTH EACH MORNING     Cardiovascular: Diuretic Combos Failed - 01/14/2020 11:53 AM      Failed - Cr in normal range and within 360 days    Creat  Date Value Ref Range Status  10/02/2017 1.15 0.70 - 1.18 mg/dL Final    Comment:    For patients >44 years of age, the reference limit for Creatinine is approximately 13% higher for people identified as African-American. .    Creatinine, Ser  Date Value Ref Range Status  11/26/2019 1.74 (H) 0.61 - 1.24 mg/dL Final         Failed - Ca in normal range and within 360 days    Calcium  Date Value Ref Range Status  11/26/2019 8.7 (L) 8.9 - 10.3 mg/dL Final         Passed - K in normal range and within 360 days    Potassium  Date Value Ref Range Status  11/26/2019 4.1 3.5 - 5.1 mmol/L Final  05/02/2015 4.3 mmol/L Final         Passed - Na in normal range and within 360 days    Sodium  Date Value Ref Range Status  11/26/2019 136 135 - 145 mmol/L Final  05/02/2015 139  Final         Passed - Last BP in normal range    BP Readings from Last 1 Encounters:  11/26/19 (!) 128/51         Passed - Valid encounter within last 6 months    Recent Outpatient Visits          1 month ago [place], Donald E, MD   2 years ago Urinary tract infection with hematuria, site unspecified   [facility] Birdie Sons, MD   2 years ago Urinary tract infection with hematuria, site unspecified   [facility] Birdie Sons, MD   2 years ago Medicare annual wellness visit, subsequent   [facility] Birdie Sons, MD   3 years ago Hypersomnia   [facility] Caryn Section, Kirstie Peri, MD

## 2020-02-08 DIAGNOSIS — Z23 Encounter for immunization: Secondary | ICD-10-CM | POA: Diagnosis not present

## 2020-02-27 DIAGNOSIS — H2513 Age-related nuclear cataract, bilateral: Secondary | ICD-10-CM | POA: Diagnosis not present

## 2020-02-27 DIAGNOSIS — H524 Presbyopia: Secondary | ICD-10-CM | POA: Diagnosis not present

## 2020-02-27 DIAGNOSIS — H52223 Regular astigmatism, bilateral: Secondary | ICD-10-CM | POA: Diagnosis not present

## 2020-02-27 DIAGNOSIS — H35723 Serous detachment of retinal pigment epithelium, bilateral: Secondary | ICD-10-CM | POA: Diagnosis not present

## 2020-02-27 DIAGNOSIS — E119 Type 2 diabetes mellitus without complications: Secondary | ICD-10-CM | POA: Diagnosis not present

## 2020-02-27 LAB — HM DIABETES EYE EXAM

## 2020-02-29 DIAGNOSIS — H902 Conductive hearing loss, unspecified: Secondary | ICD-10-CM | POA: Diagnosis not present

## 2020-02-29 DIAGNOSIS — H6123 Impacted cerumen, bilateral: Secondary | ICD-10-CM | POA: Diagnosis not present

## 2020-03-02 DIAGNOSIS — Z23 Encounter for immunization: Secondary | ICD-10-CM | POA: Diagnosis not present

## 2020-04-03 ENCOUNTER — Other Ambulatory Visit: Payer: Self-pay | Admitting: Family Medicine

## 2020-04-03 DIAGNOSIS — I10 Essential (primary) hypertension: Secondary | ICD-10-CM

## 2020-04-03 NOTE — Telephone Encounter (Signed)
Requested Prescriptions  Pending Prescriptions Disp Refills  . lisinopril (ZESTRIL) 20 MG tablet [Pharmacy Med Name: LISINOPRIL 20 MG TAB] 90 tablet 0    Sig: TAKE ONE TABLET BY MOUTH EVERY DAY     Cardiovascular:  ACE Inhibitors Failed - 04/03/2020 12:25 PM      Failed - Cr in normal range and within 180 days    Creat  Date Value Ref Range Status  10/02/2017 1.15 0.70 - 1.18 mg/dL Final    Comment:    For patients >77 years of age, the reference limit for Creatinine is approximately 13% higher for people identified as African-American. .    Creatinine, Ser  Date Value Ref Range Status  11/26/2019 1.74 (H) 0.61 - 1.24 mg/dL Final         Passed - K in normal range and within 180 days    Potassium  Date Value Ref Range Status  11/26/2019 4.1 3.5 - 5.1 mmol/L Final  05/02/2015 4.3 mmol/L Final         Passed - Patient is not pregnant      Passed - Last BP in normal range    BP Readings from Last 1 Encounters:  11/26/19 (!) 128/51         Passed - Valid encounter within last 6 months    Recent Outpatient Visits          4 months ago [place], Donald E, MD   2 years ago Urinary tract infection with hematuria, site unspecified   [facility] Birdie Sons, MD   2 years ago Urinary tract infection with hematuria, site unspecified   [facility] Birdie Sons, MD   3 years ago Medicare annual wellness visit, subsequent   [facility] Birdie Sons, MD   3 years ago Hypersomnia   [facility] Birdie Sons, MD

## 2020-04-05 DIAGNOSIS — E1122 Type 2 diabetes mellitus with diabetic chronic kidney disease: Secondary | ICD-10-CM | POA: Diagnosis not present

## 2020-04-05 DIAGNOSIS — E1140 Type 2 diabetes mellitus with diabetic neuropathy, unspecified: Secondary | ICD-10-CM | POA: Diagnosis not present

## 2020-04-05 DIAGNOSIS — Z6839 Body mass index (BMI) 39.0-39.9, adult: Secondary | ICD-10-CM | POA: Diagnosis not present

## 2020-04-05 DIAGNOSIS — I129 Hypertensive chronic kidney disease with stage 1 through stage 4 chronic kidney disease, or unspecified chronic kidney disease: Secondary | ICD-10-CM | POA: Diagnosis not present

## 2020-04-05 DIAGNOSIS — N183 Chronic kidney disease, stage 3 unspecified: Secondary | ICD-10-CM | POA: Diagnosis not present

## 2020-04-05 DIAGNOSIS — Z794 Long term (current) use of insulin: Secondary | ICD-10-CM | POA: Diagnosis not present

## 2020-04-05 DIAGNOSIS — F0391 Unspecified dementia with behavioral disturbance: Secondary | ICD-10-CM | POA: Diagnosis not present

## 2020-04-05 DIAGNOSIS — E1151 Type 2 diabetes mellitus with diabetic peripheral angiopathy without gangrene: Secondary | ICD-10-CM | POA: Diagnosis not present

## 2020-04-05 DIAGNOSIS — E782 Mixed hyperlipidemia: Secondary | ICD-10-CM | POA: Diagnosis not present

## 2020-04-05 DIAGNOSIS — I10 Essential (primary) hypertension: Secondary | ICD-10-CM | POA: Diagnosis not present

## 2020-04-05 LAB — HEMOGLOBIN A1C: Hemoglobin A1C: 7.6

## 2020-05-15 DIAGNOSIS — D2262 Melanocytic nevi of left upper limb, including shoulder: Secondary | ICD-10-CM | POA: Diagnosis not present

## 2020-05-15 DIAGNOSIS — D2261 Melanocytic nevi of right upper limb, including shoulder: Secondary | ICD-10-CM | POA: Diagnosis not present

## 2020-05-15 DIAGNOSIS — D225 Melanocytic nevi of trunk: Secondary | ICD-10-CM | POA: Diagnosis not present

## 2020-05-15 DIAGNOSIS — Z85828 Personal history of other malignant neoplasm of skin: Secondary | ICD-10-CM | POA: Diagnosis not present

## 2020-05-15 DIAGNOSIS — D2271 Melanocytic nevi of right lower limb, including hip: Secondary | ICD-10-CM | POA: Diagnosis not present

## 2020-05-15 DIAGNOSIS — L570 Actinic keratosis: Secondary | ICD-10-CM | POA: Diagnosis not present

## 2020-05-15 DIAGNOSIS — X32XXXA Exposure to sunlight, initial encounter: Secondary | ICD-10-CM | POA: Diagnosis not present

## 2020-06-25 ENCOUNTER — Other Ambulatory Visit: Payer: Self-pay | Admitting: Family Medicine

## 2020-06-25 DIAGNOSIS — I10 Essential (primary) hypertension: Secondary | ICD-10-CM

## 2020-07-18 ENCOUNTER — Other Ambulatory Visit: Payer: Self-pay | Admitting: Family Medicine

## 2020-07-18 DIAGNOSIS — I10 Essential (primary) hypertension: Secondary | ICD-10-CM

## 2020-07-18 NOTE — Telephone Encounter (Signed)
Call to patient's wife- scheduled follow up- courtesy RF given#[phone]

## 2020-07-18 NOTE — Telephone Encounter (Signed)
Requested medication (s) are due for refill today:   Provider's decision  Requested medication (s) are on the active medication list:   Yes  Future visit scheduled:   No   Last ordered: Non delegated refill   Requested Prescriptions  Pending Prescriptions Disp Refills   Vitamin D, Ergocalciferol, (DRISDOL) 1.25 MG (50000 UNIT) CAPS capsule [Pharmacy Med Name: VITAMIN D (ERGOCALCIFEROL) 1.25 MG] 12 capsule 4    Sig: TAKE 1 CAPSULE BY MOUTH ONCE A WEEK      Endocrinology:  Vitamins - Vitamin D Supplementation Failed - 07/18/2020 12:38 PM      Failed - 50,000 IU strengths are not delegated      Failed - Ca in normal range and within 360 days    Calcium  Date Value Ref Range Status  11/26/2019 8.7 (L) 8.9 - 10.3 mg/dL Final          Failed - Phosphate in normal range and within 360 days    No results found for: PHOS        Failed - Vitamin D in normal range and within 360 days    No results found for: ZO1096EA5, [id number], [id number], 25OHVITD3, [place], [place], [place], [place], 25OHVITD2, 25OHVITD3, VD25OH        Passed - Valid encounter within last 12 months    Recent Outpatient Visits           7 months ago [place], Donald E, MD   2 years ago Urinary tract infection with hematuria, site unspecified   [facility] Birdie Sons, MD   2 years ago Urinary tract infection with hematuria, site unspecified   [facility] Birdie Sons, MD   3 years ago Medicare annual wellness visit, subsequent   [facility] Birdie Sons, MD   3 years ago Hypersomnia   [facility] Caryn Section, Kirstie Peri, MD

## 2020-07-21 ENCOUNTER — Other Ambulatory Visit: Payer: Self-pay | Admitting: Family Medicine

## 2020-07-21 DIAGNOSIS — I10 Essential (primary) hypertension: Secondary | ICD-10-CM

## 2020-07-21 NOTE — Telephone Encounter (Signed)
Pt must keep upcoming appt for further RF Requested Prescriptions  Pending Prescriptions Disp Refills  . triamterene-hydrochlorothiazide (DYAZIDE) 37.5-25 MG capsule [Pharmacy Med Name: TRIAMTERENE-HCTZ 37.5-25 MG CAP] 30 capsule 0    Sig: TAKE 1 CAPSULE BY MOUTH EACH MORNING     Cardiovascular: Diuretic Combos Failed - 07/21/2020  1:12 PM      Failed - Cr in normal range and within 360 days    Creat  Date Value Ref Range Status  10/02/2017 1.15 0.70 - 1.18 mg/dL Final    Comment:    For patients >16 years of age, the reference limit for Creatinine is approximately 13% higher for people identified as African-American. .    Creatinine, Ser  Date Value Ref Range Status  11/26/2019 1.74 (H) 0.61 - 1.24 mg/dL Final         Failed - Ca in normal range and within 360 days    Calcium  Date Value Ref Range Status  11/26/2019 8.7 (L) 8.9 - 10.3 mg/dL Final         Failed - Valid encounter within last 6 months    Recent Outpatient Visits          7 months ago [place], Donald E, MD   2 years ago Urinary tract infection with hematuria, site unspecified   [facility] Birdie Sons, MD   2 years ago Urinary tract infection with hematuria, site unspecified   [facility] Birdie Sons, MD   3 years ago Medicare annual wellness visit, subsequent   [facility] Birdie Sons, MD   3 years ago Hypersomnia   [place], MD      Future Appointments            In 3 weeks [place], Kirstie Peri, MD [facility]           Passed - K in normal range and within 360 days    Potassium  Date Value Ref Range Status  11/26/2019 4.1 3.5 - 5.1 mmol/L Final  05/02/2015 4.3 mmol/L Final         Passed - Na in normal range and within 360 days    Sodium  Date Value Ref Range Status  11/26/2019 136 135 - 145 mmol/L Final  05/02/2015 139  Final          Passed - Last BP in normal range    BP Readings from Last 1 Encounters:  11/26/19 (!) 128/51

## 2020-08-13 ENCOUNTER — Encounter: Payer: Self-pay | Admitting: Family Medicine

## 2020-08-13 ENCOUNTER — Other Ambulatory Visit: Payer: Self-pay

## 2020-08-13 ENCOUNTER — Ambulatory Visit (INDEPENDENT_AMBULATORY_CARE_PROVIDER_SITE_OTHER): Payer: Medicare Other | Admitting: Family Medicine

## 2020-08-13 VITALS — BP 130/69 | HR 74 | Temp 98.6°F | Resp 16 | Wt 286.0 lb

## 2020-08-13 DIAGNOSIS — Z794 Long term (current) use of insulin: Secondary | ICD-10-CM

## 2020-08-13 DIAGNOSIS — E1140 Type 2 diabetes mellitus with diabetic neuropathy, unspecified: Secondary | ICD-10-CM

## 2020-08-13 DIAGNOSIS — I10 Essential (primary) hypertension: Secondary | ICD-10-CM

## 2020-08-13 DIAGNOSIS — R0609 Other forms of dyspnea: Secondary | ICD-10-CM

## 2020-08-13 DIAGNOSIS — R29898 Other symptoms and signs involving the musculoskeletal system: Secondary | ICD-10-CM | POA: Diagnosis not present

## 2020-08-13 DIAGNOSIS — E782 Mixed hyperlipidemia: Secondary | ICD-10-CM | POA: Diagnosis not present

## 2020-08-13 DIAGNOSIS — R0600 Dyspnea, unspecified: Secondary | ICD-10-CM

## 2020-08-13 LAB — POCT GLYCOSYLATED HEMOGLOBIN (HGB A1C): Hemoglobin A1C: 6.8 % — AB (ref 4.0–5.6)

## 2020-08-13 NOTE — Progress Notes (Signed)
Established patient visit   Patient: Norman Palmer   DOB: 12/26/1940   79 y.o. Male  MRN: [id number] Visit Date: 08/13/2020  Today's healthcare provider: Lelon Huh, MD   No chief complaint on file.  Subjective    HPI  Diabetes Mellitus Type II, follow-up  Lab Results  Component Value Date   HGBA1C 8.8 01/29/2017   HGBA1C 7.3 09/05/2015   HGBA1C 8.6 05/31/2015   Last seen for diabetes over 3 years ago.  Management since then includes continuing the same treatment. He reports good compliance with treatment. He is not having side effects.   Home blood sugar records: not checked  Episodes of hypoglycemia? No    Current insulin regiment: none  Most Recent Eye Exam: 02/27/2020  --------------------------------------------------------------------------------------------------- Hypertension, follow-up  BP Readings from Last 3 Encounters:  11/26/19 (!) 128/51  03/17/18 (!) 128/54  10/13/17 (!) 118/58   Wt Readings from Last 3 Encounters:  11/26/19 297 lb (134.7 kg)  03/17/18 282 lb 12.8 oz (128.3 kg)  10/13/17 279 lb (126.6 kg)     He was last seen for hypertension over 3 years ago.  BP at that visit: see above Management since that visit includes none He reports excellent compliance with treatment. He is not having side effects.  He is not exercising. He is adherent to low salt diet.   Outside blood pressures are not being checked out side of office.  He does not smoke.  Use of agents associated with hypertension: none.   --------------------------------------------------------------------------------------------------- Lipid/Cholesterol, follow-up  Last Lipid Panel: Lab Results  Component Value Date   CHOL 165 11/20/2014   LDLCALC 90 11/20/2014   HDL 54 11/20/2014   TRIG 104 11/20/2014    He was last seen for this over 3 years ago.  Management since that visit includes none.  He reports excellent compliance with treatment. He is not  having side effects.   Symptoms: No appetite changes No foot ulcerations  No chest pain No chest pressure/discomfort  Yes dyspnea No orthopnea  No fatigue No lower extremity edema  No palpitations No paroxysmal nocturnal dyspnea  No nausea No numbness or tingling of extremity  No polydipsia No polyuria  No speech difficulty No syncope   He is following a Regular diet. Current exercise: none  Last metabolic panel Lab Results  Component Value Date   GLUCOSE 182 (H) 11/26/2019   NA 136 11/26/2019   K 4.1 11/26/2019   BUN 43 (H) 11/26/2019   CREATININE 1.74 (H) 11/26/2019   GFRNONAA 37 (L) 11/26/2019   GFRAA 43 (L) 11/26/2019   CALCIUM 8.7 (L) 11/26/2019   AST 35 11/26/2019   ALT 27 11/26/2019   The 10-year ASCVD risk score Mikey Bussing DC Jr., et al., 2013) is: 50.6%  ---------------------------------------------------------------------------------------------------  He is here with his wife today reporting that he has increasing weakness in his legs and difficulty standing up from chair. He has also been limping on his right leg. She states he gets very short of breath with ambulation, limiting his mobility. The patient denies having any pains. He is still followed by IM at [facility]. She reports that he has been increasingly irritable and argumentative and was started on sertraline a few months ago by internist at [facility].       Medications: Outpatient Medications Prior to Visit  Medication Sig   celecoxib (CELEBREX) 200 MG capsule Take 200 mg by mouth daily.    donepezil (ARICEPT) 10 MG tablet Take 10 mg by  mouth at bedtime.    dutasteride (AVODART) 0.5 MG capsule TAKE 1 CAPSULE BY MOUTH ONCE DAILY (Patient taking differently: Take 0.5 mg by mouth daily. )   glipiZIDE-metformin (METAGLIP) 2.5-500 MG tablet Take 1 tablet by mouth 2 (two) times daily before a meal. 1 Tablet 2 Times daily With Meals   insulin NPH Human (HUMULIN N,NOVOLIN N) 100 UNIT/ML injection Inject 30 Units into  the skin every evening.    lisinopril (ZESTRIL) 20 MG tablet TAKE ONE TABLET BY MOUTH EVERY DAY   metFORMIN (GLUCOPHAGE) 1000 MG tablet TAKE ONE TABLET TWICE DAILY WITH MEALS (Patient taking differently: Take 1,000 mg by mouth 2 (two) times daily with a meal. )   Potassium 99 MG TABS Take 1 tablet by mouth daily.   pregabalin (LYRICA) 150 MG capsule Take 150 mg by mouth daily.   sertraline (ZOLOFT) 50 MG tablet Take 50 mg by mouth daily.   simvastatin (ZOCOR) 40 MG tablet Take 40 mg by mouth every evening.    tamsulosin (FLOMAX) 0.4 MG CAPS capsule Take 1 capsule (0.4 mg total) by mouth daily.   triamterene-hydrochlorothiazide (DYAZIDE) 37.5-25 MG capsule TAKE 1 CAPSULE BY MOUTH EACH MORNING   Vitamin D, Ergocalciferol, (DRISDOL) 1.25 MG (50000 UNIT) CAPS capsule TAKE 1 CAPSULE BY MOUTH ONCE A WEEK   No facility-administered medications prior to visit.    Review of Systems  Constitutional: Negative.   Respiratory: Negative.   Cardiovascular: Negative.   Musculoskeletal: Negative.       Objective    BP 130/69    Pulse 74    Temp 98.6 F (37 C) (Oral)    Resp 16    Wt 286 lb (129.7 kg)    BMI 38.79 kg/m    Physical Exam   General: Appearance:    Severely obese male in no acute distress  Eyes:    PERRL, conjunctiva/corneas clear, EOM's intact       Lungs:     Clear to auscultation bilaterally, respirations unlabored  Heart:    Normal heart rate. Normal rhythm. No murmurs, rubs, or gallops.   MS:   All extremities are intact.   Neurologic:   Awake, alert, oriented x 2. Is able to stand up with difficulty from chair, but does not require assistance. LE leg strength is +4. Struggles to move right leg forward when walking.        Results for orders placed or performed in visit on 08/13/20  POCT glycosylated hemoglobin (Hb A1C)  Result Value Ref Range   Hemoglobin A1C 6.8 (A) 4.0 - 5.6 %   HbA1c POC (<> result, manual entry)     HbA1c, POC (prediabetic range)     HbA1c,  POC (controlled diabetic range)    Results for orders placed or performed in visit on 08/13/20  Hemoglobin A1c  Result Value Ref Range   Hemoglobin A1C 7.6   HM HEPATITIS C SCREENING LAB  Result Value Ref Range   HM Hepatitis Screen Negative-Validated     Assessment & Plan     1. Type 2 diabetes mellitus with diabetic neuropathy, with long-term current use of insulin ([place]) Fairly well controlled. Continue current medications.  Follow up [facility] as scheduled.   2. Essential hypertension Stable, Continue current medications.    3. Mixed hyperlipidemia He is tolerating simvastatin well with no adverse effects.    4. Dyspnea on exertion  - CBC - Brain natriuretic peptide  5. Weakness of lower extremity, unspecified laterality  - Ambulatory referral  to Home Health  6. Morbid obesity ([place]) Encouraged increased exercise, but his mobility is significantly limited as above.        The entirety of the information documented in the History of Present Illness, Review of Systems and Physical Exam were personally obtained by me. Portions of this information were initially documented by the CMA and reviewed by me for thoroughness and accuracy.      Lelon Huh, MD  [facility] [phone] (phone) [phone] (fax)  [place]

## 2020-08-14 LAB — CBC
Hematocrit: 36.8 % — ABNORMAL LOW (ref 37.5–51.0)
Hemoglobin: 11.7 g/dL — ABNORMAL LOW (ref 13.0–17.7)
MCH: 30.1 pg (ref 26.6–33.0)
MCHC: 31.8 g/dL (ref 31.5–35.7)
MCV: 95 fL (ref 79–97)
Platelets: 266 10*3/uL (ref 150–450)
RBC: 3.89 x10E6/uL — ABNORMAL LOW (ref 4.14–5.80)
RDW: 11.9 % (ref 11.6–15.4)
WBC: 11.6 10*3/uL — ABNORMAL HIGH (ref 3.4–10.8)

## 2020-08-14 LAB — BRAIN NATRIURETIC PEPTIDE: BNP: 43.7 pg/mL (ref 0.0–100.0)

## 2020-08-15 ENCOUNTER — Telehealth: Payer: Self-pay

## 2020-08-15 NOTE — Telephone Encounter (Signed)
Tried calling patient's wife Santiago Glad Left message to call back. OK for [facility] triage to advise as below.

## 2020-08-15 NOTE — Telephone Encounter (Signed)
-----   Message from Birdie Sons, MD sent at 08/15/2020 12:45 PM EDT ----- Is slightly anemic, but improved from the last it was checked in December. Heart functions are normal. Shortness of breath is likely from deconditioning. Home health referral for PT has been placed. Please advise patient's wife Jacobb Alen.

## 2020-08-16 DIAGNOSIS — E119 Type 2 diabetes mellitus without complications: Secondary | ICD-10-CM | POA: Diagnosis not present

## 2020-08-16 DIAGNOSIS — N400 Enlarged prostate without lower urinary tract symptoms: Secondary | ICD-10-CM | POA: Diagnosis not present

## 2020-08-16 DIAGNOSIS — M1990 Unspecified osteoarthritis, unspecified site: Secondary | ICD-10-CM | POA: Diagnosis not present

## 2020-08-16 DIAGNOSIS — R419 Unspecified symptoms and signs involving cognitive functions and awareness: Secondary | ICD-10-CM | POA: Diagnosis not present

## 2020-08-16 DIAGNOSIS — Z7982 Long term (current) use of aspirin: Secondary | ICD-10-CM | POA: Diagnosis not present

## 2020-08-16 DIAGNOSIS — Z794 Long term (current) use of insulin: Secondary | ICD-10-CM | POA: Diagnosis not present

## 2020-08-16 DIAGNOSIS — E785 Hyperlipidemia, unspecified: Secondary | ICD-10-CM | POA: Diagnosis not present

## 2020-08-16 DIAGNOSIS — Z791 Long term (current) use of non-steroidal anti-inflammatories (NSAID): Secondary | ICD-10-CM | POA: Diagnosis not present

## 2020-08-16 DIAGNOSIS — E1151 Type 2 diabetes mellitus with diabetic peripheral angiopathy without gangrene: Secondary | ICD-10-CM | POA: Diagnosis not present

## 2020-08-16 DIAGNOSIS — M5440 Lumbago with sciatica, unspecified side: Secondary | ICD-10-CM | POA: Diagnosis not present

## 2020-08-16 DIAGNOSIS — G9009 Other idiopathic peripheral autonomic neuropathy: Secondary | ICD-10-CM | POA: Diagnosis not present

## 2020-08-16 DIAGNOSIS — Z9181 History of falling: Secondary | ICD-10-CM | POA: Diagnosis not present

## 2020-08-16 DIAGNOSIS — G4733 Obstructive sleep apnea (adult) (pediatric): Secondary | ICD-10-CM | POA: Diagnosis not present

## 2020-08-16 DIAGNOSIS — E559 Vitamin D deficiency, unspecified: Secondary | ICD-10-CM | POA: Diagnosis not present

## 2020-08-16 DIAGNOSIS — Z6838 Body mass index (BMI) 38.0-38.9, adult: Secondary | ICD-10-CM | POA: Diagnosis not present

## 2020-08-16 DIAGNOSIS — Z85828 Personal history of other malignant neoplasm of skin: Secondary | ICD-10-CM | POA: Diagnosis not present

## 2020-08-16 DIAGNOSIS — I10 Essential (primary) hypertension: Secondary | ICD-10-CM | POA: Diagnosis not present

## 2020-08-16 DIAGNOSIS — E1140 Type 2 diabetes mellitus with diabetic neuropathy, unspecified: Secondary | ICD-10-CM | POA: Diagnosis not present

## 2020-08-16 DIAGNOSIS — Z87891 Personal history of nicotine dependence: Secondary | ICD-10-CM | POA: Diagnosis not present

## 2020-08-16 NOTE — Telephone Encounter (Signed)
Reviewed lab results and physician's note with the patient's wife. PT was evaluating patient as we spoke. No further questions.

## 2020-08-17 DIAGNOSIS — F0281 Dementia in other diseases classified elsewhere with behavioral disturbance: Secondary | ICD-10-CM | POA: Diagnosis not present

## 2020-08-17 DIAGNOSIS — G4733 Obstructive sleep apnea (adult) (pediatric): Secondary | ICD-10-CM | POA: Diagnosis not present

## 2020-08-17 DIAGNOSIS — I10 Essential (primary) hypertension: Secondary | ICD-10-CM | POA: Diagnosis not present

## 2020-08-17 DIAGNOSIS — E1140 Type 2 diabetes mellitus with diabetic neuropathy, unspecified: Secondary | ICD-10-CM | POA: Diagnosis not present

## 2020-08-17 DIAGNOSIS — Z6839 Body mass index (BMI) 39.0-39.9, adult: Secondary | ICD-10-CM | POA: Diagnosis not present

## 2020-08-19 ENCOUNTER — Other Ambulatory Visit: Payer: Self-pay | Admitting: Family Medicine

## 2020-08-19 DIAGNOSIS — I10 Essential (primary) hypertension: Secondary | ICD-10-CM

## 2020-08-20 ENCOUNTER — Other Ambulatory Visit: Payer: Self-pay | Admitting: Family Medicine

## 2020-08-20 DIAGNOSIS — I10 Essential (primary) hypertension: Secondary | ICD-10-CM

## 2020-08-22 ENCOUNTER — Encounter: Payer: Medicare Other | Admitting: Family Medicine

## 2020-08-23 ENCOUNTER — Other Ambulatory Visit: Payer: Self-pay

## 2020-09-15 DIAGNOSIS — E119 Type 2 diabetes mellitus without complications: Secondary | ICD-10-CM | POA: Diagnosis not present

## 2020-09-16 ENCOUNTER — Other Ambulatory Visit: Payer: Self-pay | Admitting: Family Medicine

## 2020-09-16 DIAGNOSIS — I10 Essential (primary) hypertension: Secondary | ICD-10-CM

## 2020-09-16 NOTE — Telephone Encounter (Signed)
Requested Prescriptions  Pending Prescriptions Disp Refills  . triamterene-hydrochlorothiazide (DYAZIDE) 37.5-25 MG capsule [Pharmacy Med Name: TRIAMTERENE-HCTZ 37.5-25 MG CAP] 30 capsule 1    Sig: TAKE 1 CAPSULE BY MOUTH EACH MORNING     Cardiovascular: Diuretic Combos Failed - 09/16/2020 10:26 AM      Failed - Cr in normal range and within 360 days    Creat  Date Value Ref Range Status  10/02/2017 1.15 0.70 - 1.18 mg/dL Final    Comment:    For patients >6 years of age, the reference limit for Creatinine is approximately 13% higher for people identified as African-American. .    Creatinine, Ser  Date Value Ref Range Status  11/26/2019 1.74 (H) 0.61 - 1.24 mg/dL Final         Failed - Ca in normal range and within 360 days    Calcium  Date Value Ref Range Status  11/26/2019 8.7 (L) 8.9 - 10.3 mg/dL Final         Passed - K in normal range and within 360 days    Potassium  Date Value Ref Range Status  11/26/2019 4.1 3.5 - 5.1 mmol/L Final  05/02/2015 4.3 mmol/L Final         Passed - Na in normal range and within 360 days    Sodium  Date Value Ref Range Status  11/26/2019 136 135 - 145 mmol/L Final  05/02/2015 139  Final         Passed - Last BP in normal range    BP Readings from Last 1 Encounters:  08/13/20 130/69         Passed - Valid encounter within last 6 months    Recent Outpatient Visits          1 month ago Type 2 diabetes mellitus with diabetic neuropathy, with long-term current use of insulin [facility])   [facility] Birdie Sons, MD   9 months ago [place], Donald E, MD   2 years ago Urinary tract infection with hematuria, site unspecified   [facility] Birdie Sons, MD   2 years ago Urinary tract infection with hematuria, site unspecified   [facility] Birdie Sons, MD   3 years ago Medicare annual wellness visit, subsequent   [facility]  Birdie Sons, MD

## 2020-10-13 ENCOUNTER — Other Ambulatory Visit: Payer: Self-pay | Admitting: Family Medicine

## 2020-10-13 DIAGNOSIS — I10 Essential (primary) hypertension: Secondary | ICD-10-CM

## 2020-10-13 NOTE — Telephone Encounter (Signed)
Requested Prescriptions  Pending Prescriptions Disp Refills   lisinopril (ZESTRIL) 20 MG tablet [Pharmacy Med Name: LISINOPRIL 20 MG TAB] 90 tablet 1    Sig: TAKE 1 TABLET BY MOUTH DAILY     Cardiovascular:  ACE Inhibitors Failed - 10/13/2020 10:31 AM      Failed - Cr in normal range and within 180 days    Creat  Date Value Ref Range Status  10/02/2017 1.15 0.70 - 1.18 mg/dL Final    Comment:    For patients >77 years of age, the reference limit for Creatinine is approximately 13% higher for people identified as African-American. .    Creatinine, Ser  Date Value Ref Range Status  11/26/2019 1.74 (H) 0.61 - 1.24 mg/dL Final         Failed - K in normal range and within 180 days    Potassium  Date Value Ref Range Status  11/26/2019 4.1 3.5 - 5.1 mmol/L Final  05/02/2015 4.3 mmol/L Final         Passed - Patient is not pregnant      Passed - Last BP in normal range    BP Readings from Last 1 Encounters:  08/13/20 130/69         Passed - Valid encounter within last 6 months    Recent Outpatient Visits          2 months ago Type 2 diabetes mellitus with diabetic neuropathy, with long-term current use of insulin [facility])   [facility] Birdie Sons, MD   10 months ago [place], Donald E, MD   3 years ago Urinary tract infection with hematuria, site unspecified   [facility] Birdie Sons, MD   3 years ago Urinary tract infection with hematuria, site unspecified   [facility] Birdie Sons, MD   3 years ago Medicare annual wellness visit, subsequent   [facility] Birdie Sons, MD

## 2020-11-16 ENCOUNTER — Other Ambulatory Visit: Payer: Self-pay | Admitting: Family Medicine

## 2020-11-16 DIAGNOSIS — I10 Essential (primary) hypertension: Secondary | ICD-10-CM

## 2020-11-22 DIAGNOSIS — R7989 Other specified abnormal findings of blood chemistry: Secondary | ICD-10-CM | POA: Diagnosis not present

## 2020-11-22 DIAGNOSIS — E1140 Type 2 diabetes mellitus with diabetic neuropathy, unspecified: Secondary | ICD-10-CM | POA: Diagnosis not present

## 2020-11-22 DIAGNOSIS — I10 Essential (primary) hypertension: Secondary | ICD-10-CM | POA: Diagnosis not present

## 2020-11-22 DIAGNOSIS — Z6841 Body Mass Index (BMI) 40.0 and over, adult: Secondary | ICD-10-CM | POA: Diagnosis not present

## 2020-11-22 DIAGNOSIS — Z23 Encounter for immunization: Secondary | ICD-10-CM | POA: Diagnosis not present

## 2020-11-22 DIAGNOSIS — R0602 Shortness of breath: Secondary | ICD-10-CM | POA: Diagnosis not present

## 2020-11-22 DIAGNOSIS — Z794 Long term (current) use of insulin: Secondary | ICD-10-CM | POA: Diagnosis not present

## 2020-11-22 DIAGNOSIS — G4733 Obstructive sleep apnea (adult) (pediatric): Secondary | ICD-10-CM | POA: Diagnosis not present

## 2020-11-22 DIAGNOSIS — R413 Other amnesia: Secondary | ICD-10-CM | POA: Diagnosis not present

## 2020-11-22 DIAGNOSIS — E782 Mixed hyperlipidemia: Secondary | ICD-10-CM | POA: Diagnosis not present

## 2020-11-22 LAB — HEMOGLOBIN A1C: Hemoglobin A1C: 6.9

## 2020-12-05 DIAGNOSIS — Z23 Encounter for immunization: Secondary | ICD-10-CM | POA: Diagnosis not present

## 2020-12-10 ENCOUNTER — Telehealth: Payer: Self-pay | Admitting: Family Medicine

## 2020-12-10 NOTE — Telephone Encounter (Signed)
Copied from [place] [id number]. Topic: Medicare AWV >> Dec 10, 2020 12:20 PM Cher Nakai R wrote: Reason for CRM:  Left message for patient to call back and schedule Medicare Annual Wellness Visit (AWV) in office.   If not able to come in office, please offer to do virtually.   Last AWV 03/17/2018  Please schedule at anytime with [facility] Health Advisor.  If any questions, please contact me at [phone]

## 2020-12-26 DIAGNOSIS — R600 Localized edema: Secondary | ICD-10-CM | POA: Diagnosis not present

## 2020-12-26 DIAGNOSIS — Z794 Long term (current) use of insulin: Secondary | ICD-10-CM | POA: Diagnosis not present

## 2020-12-26 DIAGNOSIS — R339 Retention of urine, unspecified: Secondary | ICD-10-CM | POA: Diagnosis not present

## 2020-12-26 DIAGNOSIS — R413 Other amnesia: Secondary | ICD-10-CM | POA: Diagnosis not present

## 2020-12-26 DIAGNOSIS — N184 Chronic kidney disease, stage 4 (severe): Secondary | ICD-10-CM | POA: Diagnosis not present

## 2020-12-26 DIAGNOSIS — E1140 Type 2 diabetes mellitus with diabetic neuropathy, unspecified: Secondary | ICD-10-CM | POA: Diagnosis not present

## 2020-12-27 ENCOUNTER — Ambulatory Visit: Payer: Self-pay | Admitting: Family Medicine

## 2020-12-27 NOTE — Telephone Encounter (Signed)
Phone call from wife.  Reported pt. Fell on closet floor on 12/22/20.  Wife stated that the pt. Has dementia, and she found him on closet floor at 3:30 AM; was laying on back and his head was on the baseboard.  Denied any head injury.  Reported he had scraped knees with some bleeding present, at time of fall.  Stated she rec'd assistance in getting him up from floor.  Reported since the fall he has shown increased weakness and decreased mobility.  Had been able to get up from bed and chair, independently, but now requires assistance. Also reported he is having difficulty with choking when he drinks water, and this is a change.  Has noticed a cough recently.  Denied any exposure to COVID.  Stated "he is not around anyone." Also voiced concern about reading in pt's chart that he has "stage 4 kidney failure."  Reported she contacted Dr. Sharlynn Oliphant at [facility], and he ordered more labs that were drawn yesterday.  Offered a virtual appt. With PCP office, due to presence of cough, but next avail. VV not until 1/11.  Disposition advises to have pt. Eval. Within 4 hours.  Attempted to reach the [facility]; no answer.  Advised wife will send note to Dr. Caryn Section to review/ make further recommendations.  Wife stated she "does not want pt. To go to the ER, unless he gets a lot worse".  Wife requesting pt. To be evaluated in the office.  Agreed with plan to send note to PCP.    Reason for Disposition . [1] MODERATE weakness (i.e., interferes with work, school, normal activities) AND [2] new-onset or worsening    Wife reported a fall on 12/22/20 and noted decreased mobility and moderate weakness since then.  Answer Assessment - Initial Assessment Questions 1. MECHANISM: "How did the fall happen?"     Fell in the closet 2. DOMESTIC VIOLENCE AND ELDER ABUSE SCREENING: "Did you fall because someone pushed you or tried to hurt you?" If Yes, ask: "Are you safe now?"     *No Answer* 3. ONSET: "When did the fall happen?" (e.g., minutes, hours, or  days ago)     12/22/20 @ 3:30 AM 4. LOCATION: "What part of the body hit the ground?" (e.g., back, buttocks, head, hips, knees, hands, head, stomach)     Found laying on his back, and head was on baseboard 5. INJURY: "Did you hurt (injure) yourself when you fell?" If Yes, ask: "What did you injure? Tell me more about this?" (e.g., body area; type of injury; pain severity)"     Scraped knees, and bruising of right breast area  6. PAIN: "Is there any pain?" If Yes, ask: "How bad is the pain?" (e.g., Scale 1-10; or mild,  moderate, severe)   - NONE (0): no pain   - MILD (1-3): doesn't interfere with normal activities    - MODERATE (4-7): interferes with normal activities or awakens from sleep    - SEVERE (8-10): excruciating pain, unable to do any normal activities      denied 7. SIZE: For cuts, bruises, or swelling, ask: "How large is it?" (e.g., inches or centimeters)      Scraped knees 8. PREGNANCY: "Is there any chance you are pregnant?" "When was your last menstrual period?"     n/a 9. OTHER SYMPTOMS: "Do you have any other symptoms?" (e.g., dizziness, fever, weakness; new onset or worsening).      Increased weakness; decreased mobility. Feels like pt's stomach is enlarged; having  loose stools for awhile, having difficulty swallowing water, and increased coughing. .   10. CAUSE: "What do you think caused the fall (or falling)?" (e.g., tripped, dizzy spell)       Unknown; wife reported dementia and pt. Likely was dreaming and went into the closet when trying to go to bathroom, when he feel.  Protocols used: FALLS AND FALLING-A-AH

## 2020-12-28 ENCOUNTER — Telehealth: Payer: Self-pay

## 2020-12-28 NOTE — Telephone Encounter (Signed)
Looks like he has appt scheduled on the 11th, is ok to be seen in office so long as he doesn't have any fever, chills, sweats, or shortness of breath. Go to ER if any of sx worsen before then.

## 2020-12-28 NOTE — Telephone Encounter (Signed)
Copied from [place] [id number]. Topic: Appointment Scheduling - Scheduling Inquiry for Clinic >> Dec 28, 2020 10:56 AM Cuthrell, Jaci Carrel wrote: Reason for CRM: Patient wife Zane Pellecchia called in stated husband has a swollen stomach, off balance, forgetfulness, requesting him to be seen in office before scheduled appt if possible, reference Nurse triage encounter 12/27/2020/ Please follow up today.

## 2020-12-30 DIAGNOSIS — Z791 Long term (current) use of non-steroidal anti-inflammatories (NSAID): Secondary | ICD-10-CM | POA: Diagnosis not present

## 2020-12-30 DIAGNOSIS — L570 Actinic keratosis: Secondary | ICD-10-CM | POA: Diagnosis not present

## 2020-12-30 DIAGNOSIS — E1142 Type 2 diabetes mellitus with diabetic polyneuropathy: Secondary | ICD-10-CM | POA: Diagnosis not present

## 2020-12-30 DIAGNOSIS — Z794 Long term (current) use of insulin: Secondary | ICD-10-CM | POA: Diagnosis not present

## 2020-12-30 DIAGNOSIS — E785 Hyperlipidemia, unspecified: Secondary | ICD-10-CM | POA: Diagnosis not present

## 2020-12-30 DIAGNOSIS — I10 Essential (primary) hypertension: Secondary | ICD-10-CM | POA: Diagnosis not present

## 2020-12-30 DIAGNOSIS — Z7984 Long term (current) use of oral hypoglycemic drugs: Secondary | ICD-10-CM | POA: Diagnosis not present

## 2020-12-30 DIAGNOSIS — I872 Venous insufficiency (chronic) (peripheral): Secondary | ICD-10-CM | POA: Diagnosis not present

## 2020-12-30 DIAGNOSIS — Z8619 Personal history of other infectious and parasitic diseases: Secondary | ICD-10-CM | POA: Diagnosis not present

## 2020-12-30 DIAGNOSIS — F0390 Unspecified dementia without behavioral disturbance: Secondary | ICD-10-CM | POA: Diagnosis not present

## 2020-12-30 DIAGNOSIS — G4710 Hypersomnia, unspecified: Secondary | ICD-10-CM | POA: Diagnosis not present

## 2020-12-30 DIAGNOSIS — N400 Enlarged prostate without lower urinary tract symptoms: Secondary | ICD-10-CM | POA: Diagnosis not present

## 2020-12-30 DIAGNOSIS — Z6841 Body Mass Index (BMI) 40.0 and over, adult: Secondary | ICD-10-CM | POA: Diagnosis not present

## 2020-12-30 DIAGNOSIS — Z9181 History of falling: Secondary | ICD-10-CM | POA: Diagnosis not present

## 2020-12-30 DIAGNOSIS — E559 Vitamin D deficiency, unspecified: Secondary | ICD-10-CM | POA: Diagnosis not present

## 2020-12-30 DIAGNOSIS — G4733 Obstructive sleep apnea (adult) (pediatric): Secondary | ICD-10-CM | POA: Diagnosis not present

## 2020-12-31 NOTE — Telephone Encounter (Signed)
I called and spoke with patients wife and advised her as below. She denies patient having any of those symptoms.

## 2021-01-01 ENCOUNTER — Other Ambulatory Visit: Payer: Self-pay

## 2021-01-01 ENCOUNTER — Ambulatory Visit (INDEPENDENT_AMBULATORY_CARE_PROVIDER_SITE_OTHER): Payer: Medicare Other | Admitting: Family Medicine

## 2021-01-01 ENCOUNTER — Encounter: Payer: Self-pay | Admitting: Family Medicine

## 2021-01-01 VITALS — BP 144/81 | HR 74 | Temp 98.2°F | Resp 20 | Wt 298.0 lb

## 2021-01-01 DIAGNOSIS — W19XXXA Unspecified fall, initial encounter: Secondary | ICD-10-CM | POA: Diagnosis not present

## 2021-01-01 DIAGNOSIS — S20211A Contusion of right front wall of thorax, initial encounter: Secondary | ICD-10-CM

## 2021-01-01 DIAGNOSIS — Z794 Long term (current) use of insulin: Secondary | ICD-10-CM

## 2021-01-01 DIAGNOSIS — R413 Other amnesia: Secondary | ICD-10-CM

## 2021-01-01 DIAGNOSIS — E1140 Type 2 diabetes mellitus with diabetic neuropathy, unspecified: Secondary | ICD-10-CM

## 2021-01-01 DIAGNOSIS — N1831 Chronic kidney disease, stage 3a: Secondary | ICD-10-CM | POA: Diagnosis not present

## 2021-01-01 NOTE — Progress Notes (Signed)
Established patient visit   Patient: Norman Palmer   DOB: 12/21/1941   80 y.o. Male  MRN: [id number] Visit Date: 01/01/2021  Today's healthcare provider: Lelon Huh, MD   Chief Complaint  Patient presents with  . Bloated   Subjective    HPI  Abdominal swelling: Patient complains of abdominal swelling. Patient's gait has also been off balance and his wife reports he has been more forgetful. Patient was prescribed Augmentin today by [facility] Dr. Sharlynn Oliphant to cover or UTI. Patient had a fall on 12/31/2021into his wife's closet. She states she found him asleep in the closet after the fall. It apparently happened around midnight New Years eve when she was up watching the ball drop. She thinks that he had fallen asleep and apparently got out of bed for some reason, walked into her closet and fell. He had quite a few minor cuts and scrapes. No clear head injury, but afterwards had significantly decline in his activity level, was unable to dress himself for over a week, required assistance getting up and ambulating, and acting more confused than usual. However he has improved the last few days, mostly able to dress himself and ambulating without assistance, although still unsteady on his feet. He is not quite back to baseline. Has not noticed any focal weakness, garbled speech, changes in eating or swallowing,  or loss of sensation.  He has been increasingly hostile, especially towards his wife.      Medications: Outpatient Medications Prior to Visit  Medication Sig  . celecoxib (CELEBREX) 200 MG capsule Take 200 mg by mouth daily.   Marland Kitchen donepezil (ARICEPT) 10 MG tablet Take 10 mg by mouth at bedtime.   . dutasteride (AVODART) 0.5 MG capsule TAKE 1 CAPSULE BY MOUTH ONCE DAILY (Patient taking differently: Take 0.5 mg by mouth daily.)  . glipiZIDE-metformin (METAGLIP) 2.5-500 MG tablet Take 1 tablet by mouth 2 (two) times daily before a meal. 1 Tablet 2 Times daily With Meals  .  lisinopril (ZESTRIL) 20 MG tablet TAKE 1 TABLET BY MOUTH DAILY  . metFORMIN (GLUCOPHAGE) 1000 MG tablet TAKE ONE TABLET TWICE DAILY WITH MEALS (Patient taking differently: Take 1,000 mg by mouth 2 (two) times daily with a meal.)  . Potassium 99 MG TABS Take 1 tablet by mouth daily.  . sertraline (ZOLOFT) 50 MG tablet Take 50 mg by mouth daily.  . simvastatin (ZOCOR) 40 MG tablet Take 40 mg by mouth every evening.   . tamsulosin (FLOMAX) 0.4 MG CAPS capsule Take 1 capsule (0.4 mg total) by mouth daily.  Marland Kitchen triamterene-hydrochlorothiazide (DYAZIDE) 37.5-25 MG capsule TAKE 1 CAPSULE BY MOUTH EACH MORNING  . Vitamin D, Ergocalciferol, (DRISDOL) 1.25 MG (50000 UNIT) CAPS capsule TAKE 1 CAPSULE BY MOUTH ONCE A WEEK  . insulin NPH Human (HUMULIN N,NOVOLIN N) 100 UNIT/ML injection Inject 30 Units into the skin every evening.   . [DISCONTINUED] pregabalin (LYRICA) 150 MG capsule Take 150 mg by mouth daily.   No facility-administered medications prior to visit.    Review of Systems  Constitutional: Positive for fatigue. Negative for appetite change, chills and fever.  Respiratory: Negative for chest tightness, shortness of breath and wheezing.   Cardiovascular: Negative for chest pain and palpitations.  Gastrointestinal: Positive for abdominal distention. Negative for abdominal pain, nausea and vomiting.  Musculoskeletal: Positive for gait problem.  Neurological: Positive for tremors and weakness.       Off balance  Psychiatric/Behavioral: Positive for confusion.  Objective    BP (!) 144/81 (BP Location: Right Arm, Patient Position: Sitting, Cuff Size: Large)   Pulse 74   Temp 98.2 F (36.8 C) (Temporal)   Resp 20   Wt 298 lb (135.2 kg)   SpO2 98% Comment: room air  BMI 40.42 kg/m    Physical Exam    General: Appearance:    Severely obese male in no acute distress  Eyes:    PERRL, conjunctiva/corneas clear, EOM's intact       Lungs:     Clear to auscultation bilaterally,  respirations unlabored  Heart:    Normal heart rate. Normal rhythm. No murmurs, rubs, or gallops.   MS:   All extremities are intact. Some bruising noted on right upper chest and abdomen.   Neurologic:   Awake, alert, oriented x 3. Able to stand and walk from sitting position. Slightly staggered gait. Correctly identifies the year and month, but not day of the week or date of the month. Frequent outbursts directed at his wife calling her names and calling a liar        Assessment & Plan     1. Contusion of right front wall of thorax, initial encounter Healing appropriately.   2. Fall, initial encounter Unclear cause, but it did happen late at night on New Years Eve, suspected to have been in a sleep stupor at the time. His ability to perform ADLs including dressing himself is nearly his baseline. 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 His wife will call if not continuing to steadily improve or if not back to basline in the next week or two.   3. Type 2 diabetes mellitus with diabetic neuropathy, with long-term current use of insulin ([place]) Very well controlled. Managed by Dr. Sharlynn Oliphant at [facility].   4. Morbid obesity ([place]) Poor portion control and very limited physical activity.   5. Memory difficulty S/p neuro work up at [facility] with cognitive vascular disorder.    6. CKD 3a Encouraged pushed clear fluids. Per his wife, recently had labs rechecked at [facility] with eGFR of 45.      The entirety of the information documented in the History of Present Illness, Review of Systems and Physical Exam were personally obtained by me. Portions of this information were initially documented by the CMA and reviewed by me for thoroughness and accuracy.      Lelon Huh, MD  [facility] [phone] (phone) [phone] (fax)  [place]

## 2021-01-28 ENCOUNTER — Other Ambulatory Visit: Payer: Self-pay | Admitting: Family Medicine

## 2021-01-28 ENCOUNTER — Telehealth: Payer: Self-pay | Admitting: Family Medicine

## 2021-01-28 DIAGNOSIS — I10 Essential (primary) hypertension: Secondary | ICD-10-CM

## 2021-01-28 NOTE — Telephone Encounter (Signed)
Copied from [place] [phone]. Topic: Medicare AWV >> Jan 28, 2021  4:02 PM Cher Nakai R wrote: Reason for CRM:  Left message for patient to call back and schedule Medicare Annual Wellness Visit (AWV) in office.   If not able to come in office, please offer to do virtually or by telephone.   Last AWV 03/17/2018  Please schedule at anytime with [facility] Health Advisor.  If any questions, please contact me at [phone]

## 2021-02-07 DIAGNOSIS — E119 Type 2 diabetes mellitus without complications: Secondary | ICD-10-CM | POA: Diagnosis not present

## 2021-02-07 DIAGNOSIS — I10 Essential (primary) hypertension: Secondary | ICD-10-CM | POA: Diagnosis not present

## 2021-02-07 DIAGNOSIS — Z87891 Personal history of nicotine dependence: Secondary | ICD-10-CM | POA: Diagnosis not present

## 2021-02-07 DIAGNOSIS — N35912 Unspecified bulbous urethral stricture, male: Secondary | ICD-10-CM | POA: Diagnosis not present

## 2021-02-07 DIAGNOSIS — Z794 Long term (current) use of insulin: Secondary | ICD-10-CM | POA: Diagnosis not present

## 2021-02-07 DIAGNOSIS — Z6837 Body mass index (BMI) 37.0-37.9, adult: Secondary | ICD-10-CM | POA: Diagnosis not present

## 2021-02-07 DIAGNOSIS — R3912 Poor urinary stream: Secondary | ICD-10-CM | POA: Diagnosis not present

## 2021-02-07 DIAGNOSIS — N401 Enlarged prostate with lower urinary tract symptoms: Secondary | ICD-10-CM | POA: Diagnosis not present

## 2021-02-25 ENCOUNTER — Other Ambulatory Visit: Payer: Self-pay | Admitting: Family Medicine

## 2021-02-25 DIAGNOSIS — I10 Essential (primary) hypertension: Secondary | ICD-10-CM

## 2021-02-25 NOTE — Telephone Encounter (Signed)
Requested medication (s) are due for refill today: yes  Requested medication (s) are on the active medication list: yes  Last refill: 11/17/2020  Future visit scheduled: no  Notes to clinic:  overdue for labs Patient is due for cpe  Requested Prescriptions  Pending Prescriptions Disp Refills   triamterene-hydrochlorothiazide (DYAZIDE) 37.5-25 MG capsule [Pharmacy Med Name: TRIAMTERENE-HCTZ 37.5-25 MG CAP] 90 capsule 0    Sig: TAKE 1 CAPSULE BY MOUTH EACH MORNING      Cardiovascular: Diuretic Combos Failed - 02/25/2021  8:34 AM      Failed - K in normal range and within 360 days    Potassium  Date Value Ref Range Status  11/26/2019 4.1 3.5 - 5.1 mmol/L Final  05/02/2015 4.3 mmol/L Final          Failed - Na in normal range and within 360 days    Sodium  Date Value Ref Range Status  11/26/2019 136 135 - 145 mmol/L Final  05/02/2015 139  Final          Failed - Cr in normal range and within 360 days    Creat  Date Value Ref Range Status  10/02/2017 1.15 0.70 - 1.18 mg/dL Final    Comment:    For patients >27 years of age, the reference limit for Creatinine is approximately 13% higher for people identified as African-American. .    Creatinine, Ser  Date Value Ref Range Status  11/26/2019 1.74 (H) 0.61 - 1.24 mg/dL Final          Failed - Ca in normal range and within 360 days    Calcium  Date Value Ref Range Status  11/26/2019 8.7 (L) 8.9 - 10.3 mg/dL Final          Failed - Last BP in normal range    BP Readings from Last 1 Encounters:  01/01/21 (!) 144/81          Passed - Valid encounter within last 6 months    Recent Outpatient Visits           1 month ago Contusion of right front wall of thorax, initial encounter   [facility] Birdie Sons, MD   6 months ago Type 2 diabetes mellitus with diabetic neuropathy, with long-term current use of insulin [facility])   [facility] Birdie Sons, MD   1 year ago [place], Donald E, MD   3 years ago Urinary tract infection with hematuria, site unspecified   [facility] Birdie Sons, MD   3 years ago Urinary tract infection with hematuria, site unspecified   [place], Kirstie Peri, MD

## 2021-02-28 DIAGNOSIS — E1142 Type 2 diabetes mellitus with diabetic polyneuropathy: Secondary | ICD-10-CM | POA: Diagnosis not present

## 2021-02-28 DIAGNOSIS — F0391 Unspecified dementia with behavioral disturbance: Secondary | ICD-10-CM | POA: Diagnosis not present

## 2021-02-28 DIAGNOSIS — E1151 Type 2 diabetes mellitus with diabetic peripheral angiopathy without gangrene: Secondary | ICD-10-CM | POA: Diagnosis not present

## 2021-02-28 DIAGNOSIS — I10 Essential (primary) hypertension: Secondary | ICD-10-CM | POA: Diagnosis not present

## 2021-02-28 DIAGNOSIS — N35919 Unspecified urethral stricture, male, unspecified site: Secondary | ICD-10-CM | POA: Diagnosis not present

## 2021-02-28 DIAGNOSIS — E1140 Type 2 diabetes mellitus with diabetic neuropathy, unspecified: Secondary | ICD-10-CM | POA: Diagnosis not present

## 2021-02-28 DIAGNOSIS — N401 Enlarged prostate with lower urinary tract symptoms: Secondary | ICD-10-CM | POA: Diagnosis not present

## 2021-02-28 DIAGNOSIS — Z794 Long term (current) use of insulin: Secondary | ICD-10-CM | POA: Diagnosis not present

## 2021-03-17 ENCOUNTER — Emergency Department: Payer: Medicare Other

## 2021-03-17 ENCOUNTER — Inpatient Hospital Stay
Admission: EM | Admit: 2021-03-17 | Discharge: 2021-03-26 | DRG: 689 | Disposition: A | Discharge status: Skilled Nursing Facility | Payer: Medicare Other | Attending: Family Medicine | Admitting: Family Medicine

## 2021-03-17 ENCOUNTER — Other Ambulatory Visit: Payer: Self-pay

## 2021-03-17 ENCOUNTER — Encounter: Payer: Self-pay | Admitting: Emergency Medicine

## 2021-03-17 DIAGNOSIS — Z85828 Personal history of other malignant neoplasm of skin: Secondary | ICD-10-CM

## 2021-03-17 DIAGNOSIS — R4189 Other symptoms and signs involving cognitive functions and awareness: Secondary | ICD-10-CM | POA: Diagnosis not present

## 2021-03-17 DIAGNOSIS — N138 Other obstructive and reflux uropathy: Secondary | ICD-10-CM | POA: Diagnosis not present

## 2021-03-17 DIAGNOSIS — N35819 Other urethral stricture, male, unspecified site: Secondary | ICD-10-CM | POA: Diagnosis present

## 2021-03-17 DIAGNOSIS — Z8249 Family history of ischemic heart disease and other diseases of the circulatory system: Secondary | ICD-10-CM

## 2021-03-17 DIAGNOSIS — G9341 Metabolic encephalopathy: Secondary | ICD-10-CM | POA: Diagnosis present

## 2021-03-17 DIAGNOSIS — E860 Dehydration: Secondary | ICD-10-CM | POA: Diagnosis present

## 2021-03-17 DIAGNOSIS — R7401 Elevation of levels of liver transaminase levels: Secondary | ICD-10-CM | POA: Diagnosis not present

## 2021-03-17 DIAGNOSIS — R338 Other retention of urine: Secondary | ICD-10-CM | POA: Diagnosis present

## 2021-03-17 DIAGNOSIS — E669 Obesity, unspecified: Secondary | ICD-10-CM | POA: Diagnosis present

## 2021-03-17 DIAGNOSIS — N401 Enlarged prostate with lower urinary tract symptoms: Secondary | ICD-10-CM

## 2021-03-17 DIAGNOSIS — E1122 Type 2 diabetes mellitus with diabetic chronic kidney disease: Secondary | ICD-10-CM | POA: Diagnosis not present

## 2021-03-17 DIAGNOSIS — I129 Hypertensive chronic kidney disease with stage 1 through stage 4 chronic kidney disease, or unspecified chronic kidney disease: Secondary | ICD-10-CM | POA: Diagnosis present

## 2021-03-17 DIAGNOSIS — R944 Abnormal results of kidney function studies: Secondary | ICD-10-CM | POA: Diagnosis not present

## 2021-03-17 DIAGNOSIS — N1832 Chronic kidney disease, stage 3b: Secondary | ICD-10-CM | POA: Diagnosis not present

## 2021-03-17 DIAGNOSIS — R4182 Altered mental status, unspecified: Secondary | ICD-10-CM | POA: Diagnosis not present

## 2021-03-17 DIAGNOSIS — R404 Transient alteration of awareness: Secondary | ICD-10-CM | POA: Diagnosis not present

## 2021-03-17 DIAGNOSIS — E1165 Type 2 diabetes mellitus with hyperglycemia: Secondary | ICD-10-CM | POA: Diagnosis not present

## 2021-03-17 DIAGNOSIS — Z87440 Personal history of urinary (tract) infections: Secondary | ICD-10-CM

## 2021-03-17 DIAGNOSIS — R410 Disorientation, unspecified: Secondary | ICD-10-CM

## 2021-03-17 DIAGNOSIS — E871 Hypo-osmolality and hyponatremia: Secondary | ICD-10-CM | POA: Diagnosis not present

## 2021-03-17 DIAGNOSIS — E559 Vitamin D deficiency, unspecified: Secondary | ICD-10-CM | POA: Diagnosis present

## 2021-03-17 DIAGNOSIS — R0902 Hypoxemia: Secondary | ICD-10-CM | POA: Diagnosis not present

## 2021-03-17 DIAGNOSIS — Z800 Family history of malignant neoplasm of digestive organs: Secondary | ICD-10-CM

## 2021-03-17 DIAGNOSIS — Z794 Long term (current) use of insulin: Secondary | ICD-10-CM | POA: Diagnosis not present

## 2021-03-17 DIAGNOSIS — M899 Disorder of bone, unspecified: Secondary | ICD-10-CM | POA: Diagnosis not present

## 2021-03-17 DIAGNOSIS — I517 Cardiomegaly: Secondary | ICD-10-CM | POA: Diagnosis not present

## 2021-03-17 DIAGNOSIS — I10 Essential (primary) hypertension: Secondary | ICD-10-CM

## 2021-03-17 DIAGNOSIS — E785 Hyperlipidemia, unspecified: Secondary | ICD-10-CM | POA: Diagnosis not present

## 2021-03-17 DIAGNOSIS — Z7401 Bed confinement status: Secondary | ICD-10-CM | POA: Diagnosis not present

## 2021-03-17 DIAGNOSIS — D72829 Elevated white blood cell count, unspecified: Secondary | ICD-10-CM

## 2021-03-17 DIAGNOSIS — I959 Hypotension, unspecified: Secondary | ICD-10-CM | POA: Diagnosis not present

## 2021-03-17 DIAGNOSIS — B9620 Unspecified Escherichia coli [E. coli] as the cause of diseases classified elsewhere: Secondary | ICD-10-CM | POA: Diagnosis present

## 2021-03-17 DIAGNOSIS — N390 Urinary tract infection, site not specified: Secondary | ICD-10-CM

## 2021-03-17 DIAGNOSIS — Z7984 Long term (current) use of oral hypoglycemic drugs: Secondary | ICD-10-CM

## 2021-03-17 DIAGNOSIS — Z8614 Personal history of Methicillin resistant Staphylococcus aureus infection: Secondary | ICD-10-CM

## 2021-03-17 DIAGNOSIS — Z888 Allergy status to other drugs, medicaments and biological substances status: Secondary | ICD-10-CM

## 2021-03-17 DIAGNOSIS — Z6841 Body Mass Index (BMI) 40.0 and over, adult: Secondary | ICD-10-CM | POA: Diagnosis not present

## 2021-03-17 DIAGNOSIS — G309 Alzheimer's disease, unspecified: Secondary | ICD-10-CM | POA: Diagnosis not present

## 2021-03-17 DIAGNOSIS — Z741 Need for assistance with personal care: Secondary | ICD-10-CM | POA: Diagnosis not present

## 2021-03-17 DIAGNOSIS — G4733 Obstructive sleep apnea (adult) (pediatric): Secondary | ICD-10-CM | POA: Diagnosis present

## 2021-03-17 DIAGNOSIS — M545 Low back pain, unspecified: Secondary | ICD-10-CM | POA: Diagnosis not present

## 2021-03-17 DIAGNOSIS — R531 Weakness: Secondary | ICD-10-CM | POA: Diagnosis not present

## 2021-03-17 DIAGNOSIS — Z87891 Personal history of nicotine dependence: Secondary | ICD-10-CM

## 2021-03-17 DIAGNOSIS — M546 Pain in thoracic spine: Secondary | ICD-10-CM | POA: Diagnosis not present

## 2021-03-17 DIAGNOSIS — R54 Age-related physical debility: Secondary | ICD-10-CM | POA: Diagnosis not present

## 2021-03-17 DIAGNOSIS — E1140 Type 2 diabetes mellitus with diabetic neuropathy, unspecified: Secondary | ICD-10-CM | POA: Diagnosis not present

## 2021-03-17 DIAGNOSIS — E119 Type 2 diabetes mellitus without complications: Secondary | ICD-10-CM | POA: Diagnosis not present

## 2021-03-17 DIAGNOSIS — Z791 Long term (current) use of non-steroidal anti-inflammatories (NSAID): Secondary | ICD-10-CM

## 2021-03-17 DIAGNOSIS — M2550 Pain in unspecified joint: Secondary | ICD-10-CM | POA: Diagnosis not present

## 2021-03-17 DIAGNOSIS — N35812 Other urethral bulbous stricture, male: Secondary | ICD-10-CM | POA: Diagnosis not present

## 2021-03-17 DIAGNOSIS — F0281 Dementia in other diseases classified elsewhere with behavioral disturbance: Secondary | ICD-10-CM | POA: Diagnosis not present

## 2021-03-17 DIAGNOSIS — E039 Hypothyroidism, unspecified: Secondary | ICD-10-CM | POA: Diagnosis present

## 2021-03-17 DIAGNOSIS — W19XXXA Unspecified fall, initial encounter: Secondary | ICD-10-CM

## 2021-03-17 DIAGNOSIS — R0602 Shortness of breath: Secondary | ICD-10-CM | POA: Diagnosis not present

## 2021-03-17 DIAGNOSIS — Z881 Allergy status to other antibiotic agents status: Secondary | ICD-10-CM

## 2021-03-17 DIAGNOSIS — Z20822 Contact with and (suspected) exposure to covid-19: Secondary | ICD-10-CM | POA: Diagnosis not present

## 2021-03-17 DIAGNOSIS — Z79899 Other long term (current) drug therapy: Secondary | ICD-10-CM

## 2021-03-17 DIAGNOSIS — M6281 Muscle weakness (generalized): Secondary | ICD-10-CM | POA: Diagnosis not present

## 2021-03-17 DIAGNOSIS — R278 Other lack of coordination: Secondary | ICD-10-CM | POA: Diagnosis not present

## 2021-03-17 DIAGNOSIS — R296 Repeated falls: Secondary | ICD-10-CM | POA: Diagnosis not present

## 2021-03-17 DIAGNOSIS — Z6838 Body mass index (BMI) 38.0-38.9, adult: Secondary | ICD-10-CM

## 2021-03-17 DIAGNOSIS — M48061 Spinal stenosis, lumbar region without neurogenic claudication: Secondary | ICD-10-CM | POA: Diagnosis not present

## 2021-03-17 DIAGNOSIS — N183 Chronic kidney disease, stage 3 unspecified: Secondary | ICD-10-CM

## 2021-03-17 HISTORY — DX: Unspecified dementia, unspecified severity, without behavioral disturbance, psychotic disturbance, mood disturbance, and anxiety: F03.90

## 2021-03-17 HISTORY — DX: Dementia in other diseases classified elsewhere, unspecified severity, without behavioral disturbance, psychotic disturbance, mood disturbance, and anxiety: F02.80

## 2021-03-17 LAB — COMPREHENSIVE METABOLIC PANEL
ALT: 16 U/L (ref 0–44)
AST: 24 U/L (ref 15–41)
Albumin: 4 g/dL (ref 3.5–5.0)
Alkaline Phosphatase: 53 U/L (ref 38–126)
Anion gap: 11 (ref 5–15)
BUN: 58 mg/dL — ABNORMAL HIGH (ref 8–23)
CO2: 21 mmol/L — ABNORMAL LOW (ref 22–32)
Calcium: 9.1 mg/dL (ref 8.9–10.3)
Chloride: 106 mmol/L (ref 98–111)
Creatinine, Ser: 1.79 mg/dL — ABNORMAL HIGH (ref 0.61–1.24)
GFR, Estimated: 39 mL/min — ABNORMAL LOW (ref >60)
Glucose, Bld: 145 mg/dL — ABNORMAL HIGH (ref 70–99)
Potassium: 4.3 mmol/L (ref 3.5–5.1)
Sodium: 138 mmol/L (ref 135–145)
Total Bilirubin: 0.6 mg/dL (ref 0.3–1.2)
Total Protein: 7.1 g/dL (ref 6.5–8.1)

## 2021-03-17 LAB — URINALYSIS, COMPLETE (UACMP) WITH MICROSCOPIC
Bilirubin Urine: NEGATIVE
Glucose, UA: NEGATIVE mg/dL
Hgb urine dipstick: NEGATIVE
Ketones, ur: NEGATIVE mg/dL
Leukocytes,Ua: NEGATIVE
Nitrite: POSITIVE — AB
Protein, ur: NEGATIVE mg/dL
Specific Gravity, Urine: 1.015 (ref 1.005–1.030)
Squamous Epithelial / HPF: NONE SEEN (ref 0–5)
pH: 5 (ref 5.0–8.0)

## 2021-03-17 LAB — RESP PANEL BY RT-PCR (FLU A&B, COVID) ARPGX2
Influenza A by PCR: NEGATIVE
Influenza B by PCR: NEGATIVE
SARS Coronavirus 2 by RT PCR: NEGATIVE

## 2021-03-17 LAB — CBC WITH DIFFERENTIAL/PLATELET
Abs Immature Granulocytes: 0.08 10*3/uL — ABNORMAL HIGH (ref 0.00–0.07)
Basophils Absolute: 0.1 10*3/uL (ref 0.0–0.1)
Basophils Relative: 0 %
Eosinophils Absolute: 0.1 10*3/uL (ref 0.0–0.5)
Eosinophils Relative: 1 %
HCT: 34.9 % — ABNORMAL LOW (ref 39.0–52.0)
Hemoglobin: 10.9 g/dL — ABNORMAL LOW (ref 13.0–17.0)
Immature Granulocytes: 1 %
Lymphocytes Relative: 14 %
Lymphs Abs: 2.2 10*3/uL (ref 0.7–4.0)
MCH: 29.9 pg (ref 26.0–34.0)
MCHC: 31.2 g/dL (ref 30.0–36.0)
MCV: 95.6 fL (ref 80.0–100.0)
Monocytes Absolute: 1.5 10*3/uL — ABNORMAL HIGH (ref 0.1–1.0)
Monocytes Relative: 10 %
Neutro Abs: 11.6 10*3/uL — ABNORMAL HIGH (ref 1.7–7.7)
Neutrophils Relative %: 74 %
Platelets: 292 10*3/uL (ref 150–400)
RBC: 3.65 MIL/uL — ABNORMAL LOW (ref 4.22–5.81)
RDW: 12.4 % (ref 11.5–15.5)
WBC: 15.5 10*3/uL — ABNORMAL HIGH (ref 4.0–10.5)
nRBC: 0 % (ref 0.0–0.2)

## 2021-03-17 LAB — TROPONIN I (HIGH SENSITIVITY)
Troponin I (High Sensitivity): 14 ng/L (ref <18)
Troponin I (High Sensitivity): 12 ng/L (ref <18)

## 2021-03-17 LAB — GLUCOSE, CAPILLARY: Glucose-Capillary: 186 mg/dL — ABNORMAL HIGH (ref 70–99)

## 2021-03-17 IMAGING — DX DG CHEST 1V PORT
1 series · 1 of 1 positions shown · non-contrast
Comparison: Most recent radiograph [DATE]

CLINICAL DATA: Weakness and shortness of breath.  Multiple falls.

EXAM:
PORTABLE CHEST 1 VIEW

[chest ap]
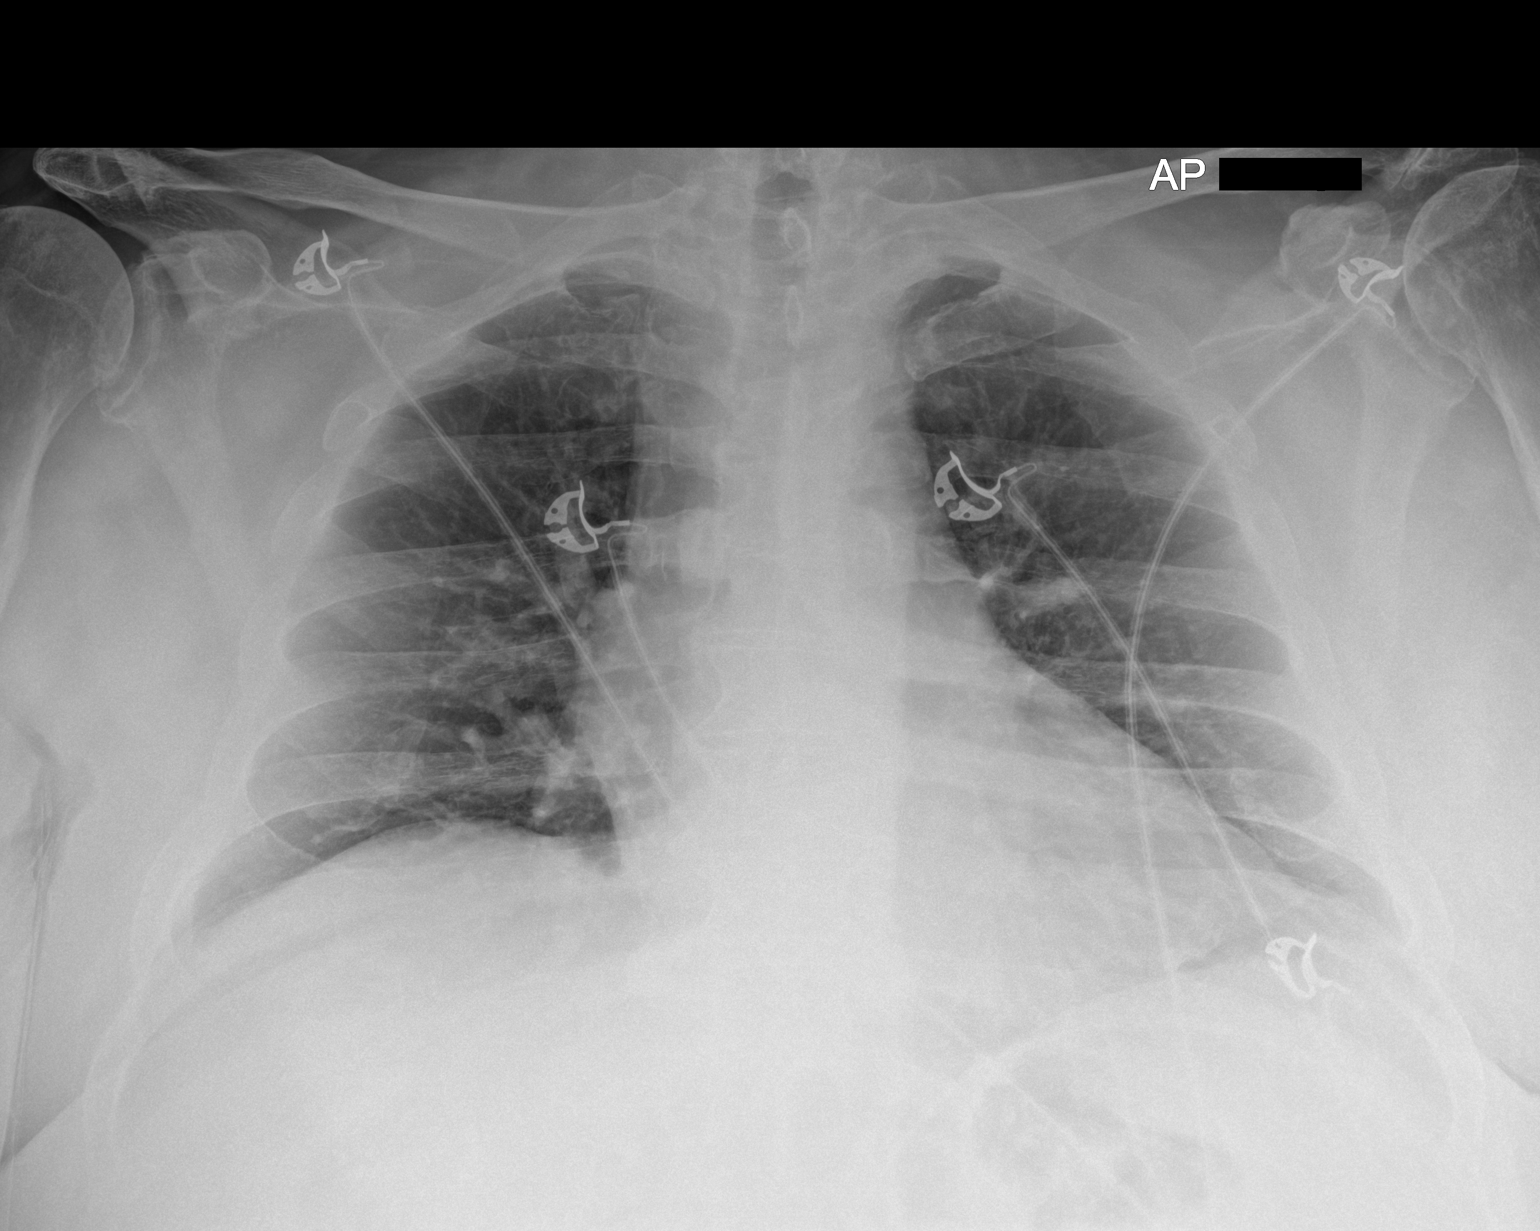

[1 of 1 positions shown; findings below may reference images not displayed]

FINDINGS: Lung volumes are low. Stable borderline cardiomegaly. Unchanged
mediastinal contours. No pulmonary edema, acute airspace disease,
large pleural effusion or pneumothorax. Degenerative change of both
shoulders. No acute osseous abnormalities are seen.
IMPRESSION: Low lung volumes without acute abnormality.

## 2021-03-17 IMAGING — CT CT L SPINE W/O CM
3 series · 10 of 35 positions shown, 12 images · non-contrast
Comparison: None.

CLINICAL DATA: Low back pain with multiple falls

EXAM:
CT LUMBAR SPINE WITHOUT CONTRAST
TECHNIQUE: Multidetector CT imaging of the lumbar spine was performed without
intravenous contrast administration. Multiplanar CT image
reconstructions were also generated.

[Series 5: l spine soft · axial · 0.37mm/px · z∈[-751,-599]mm · 2 of 166 slices shown, 3 images]
[im 51/166  soft-tissue]
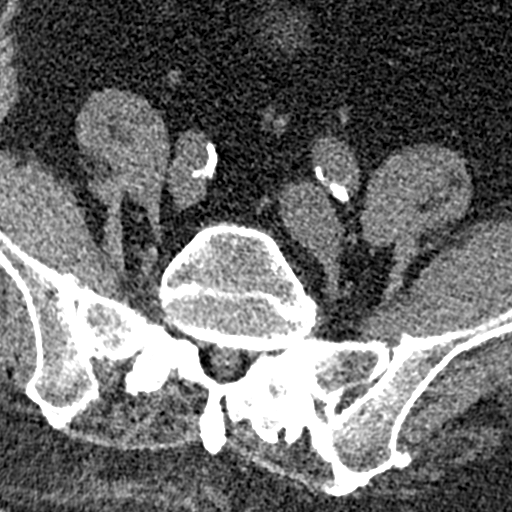
[im 51/166  bone]
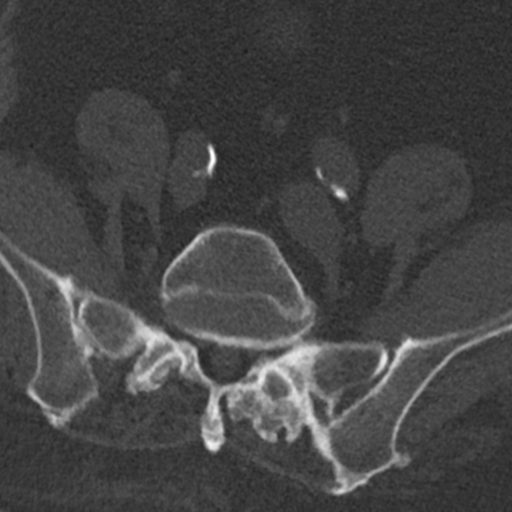
[im 127/166  bone]
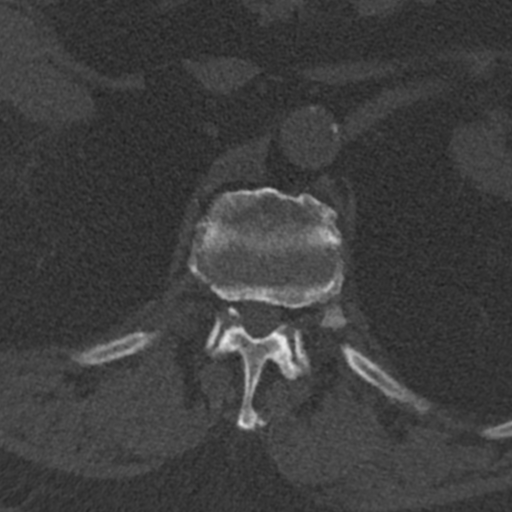

[Series 6: coronal bone · coronal · 0.39mm/px · 3 of 124 slices shown]
[im 25/124  bone]
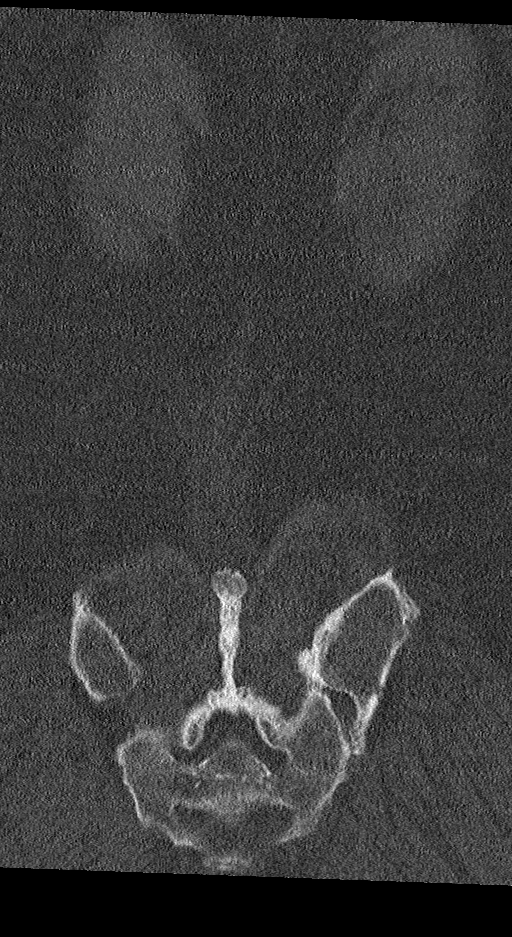
[im 50/124  bone]
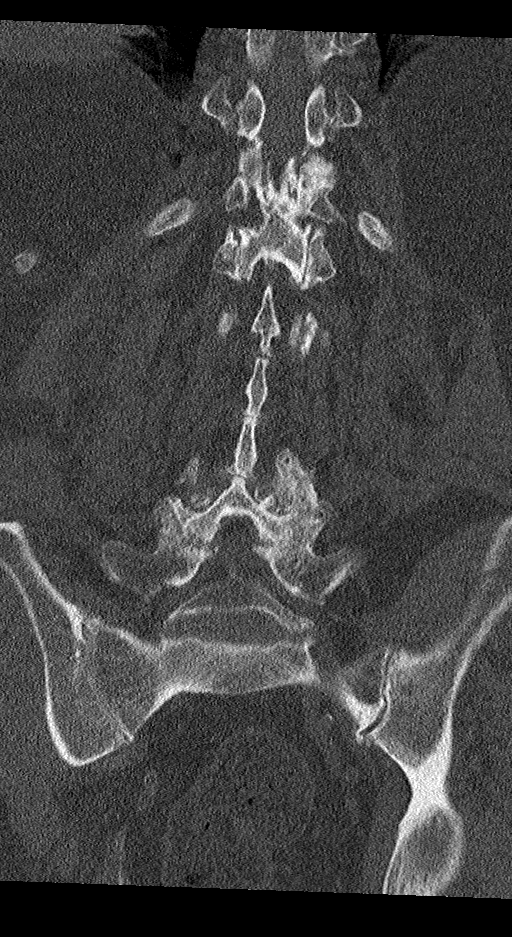
[im 74/124  bone]
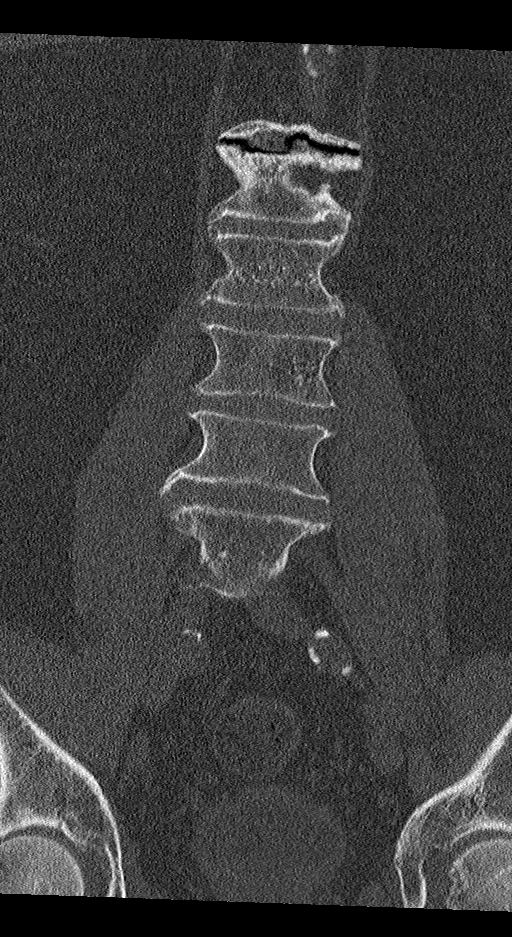

[Series 8: sagittal st · sagittal · 0.39mm/px · 5 of 109 slices shown, 6 images]
[im 37/109  bone]
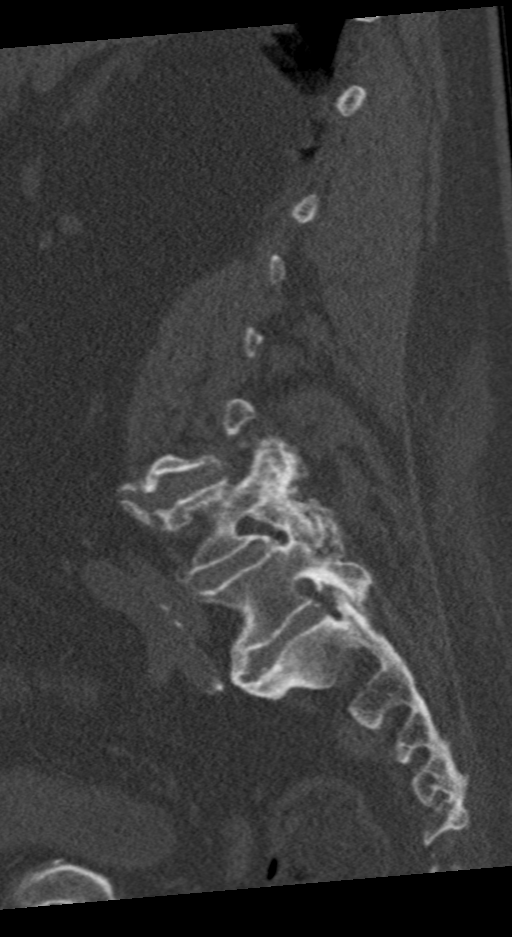
[im 46/109  bone]
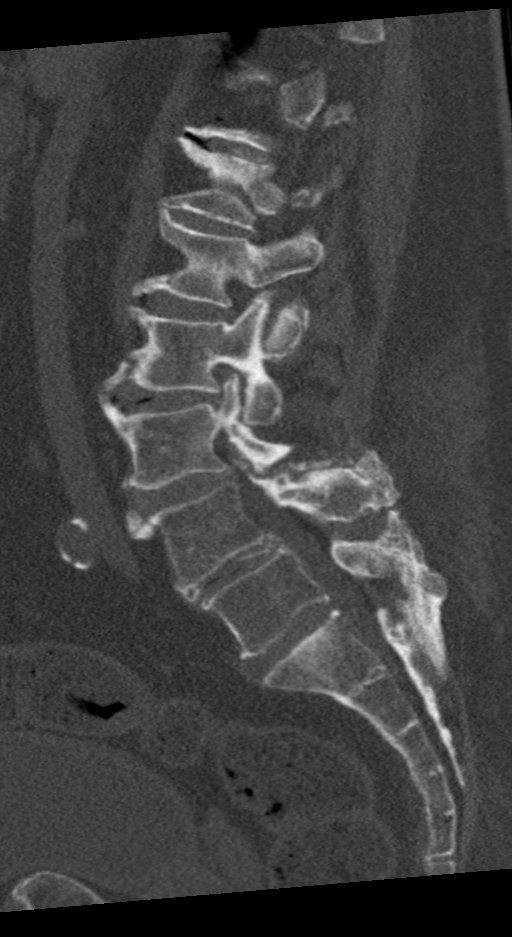
[im 55/109  soft-tissue]
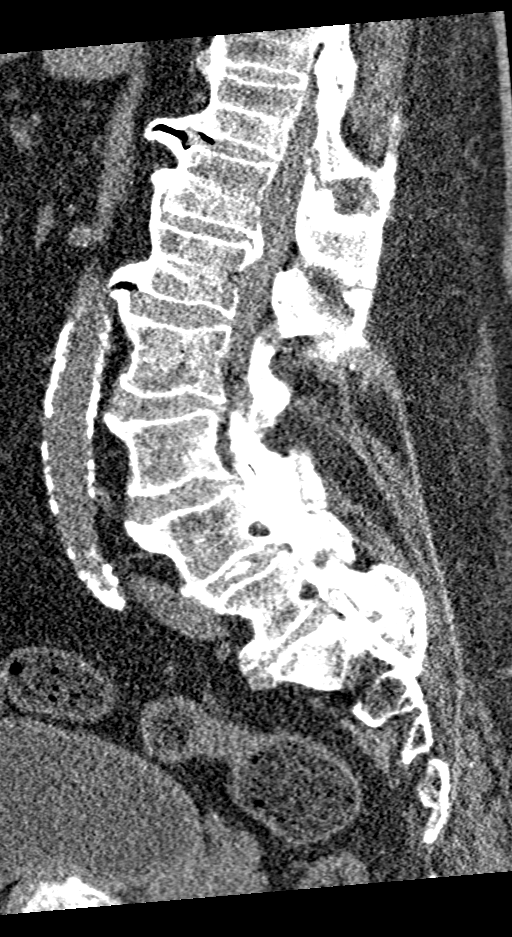
[im 55/109  bone]
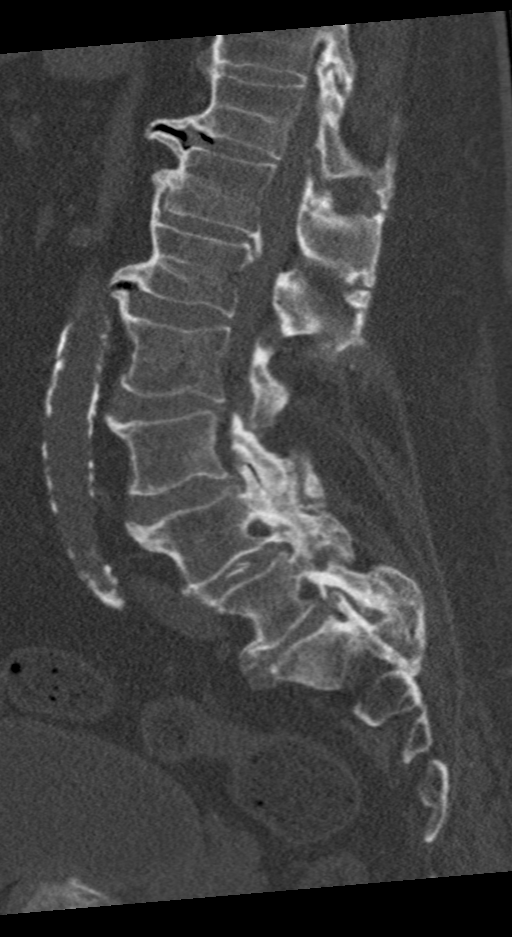
[im 64/109  bone]
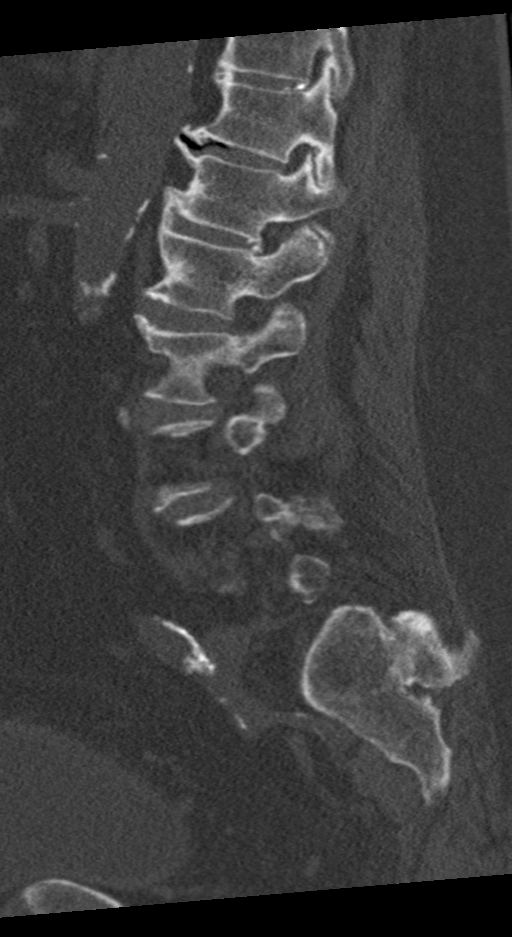
[im 73/109  bone]
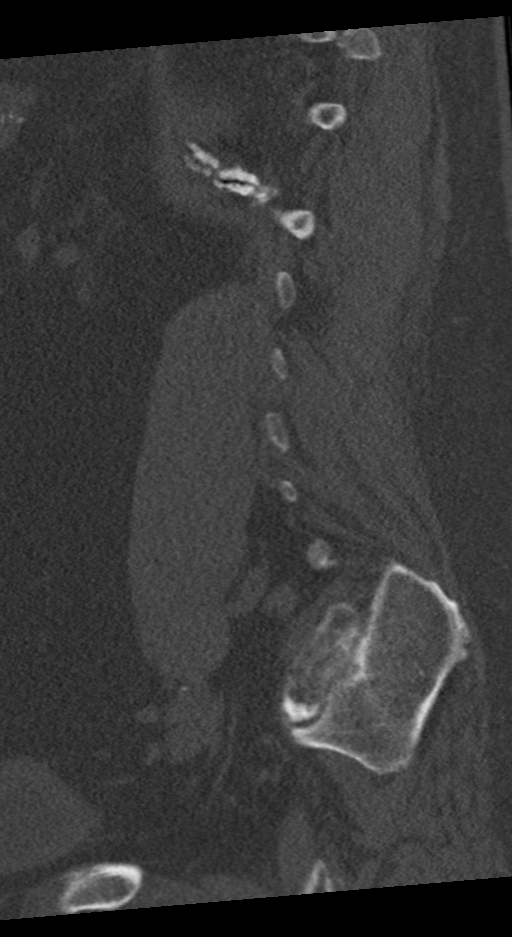

[10 of 35 positions shown; findings below may reference images not displayed]

FINDINGS: Segmentation: Standard

Alignment: Grade 1 anterolisthesis at L4-5

Vertebrae: No acute fracture or focal pathologic process.

Paraspinal and other soft tissues: Calcific aortic atherosclerosis.

Disc levels:

L1-2: Mild facet hypertrophy.  No spinal canal stenosis.

L2-3: Moderate facet hypertrophy.  Mild spinal canal stenosis.

L3-4: Severe facet hypertrophy and small disc bulge. Moderate spinal
canal stenosis. Moderate bilateral foraminal stenosis.

L4-5: Severe facet hypertrophy with small disc bulge. Mild spinal
canal stenosis. Mild right and moderate left foraminal stenosis.

L5-S1: Severe facet hypertrophy with small disc bulge. No spinal
canal stenosis. Moderate left foraminal stenosis.
IMPRESSION: 1. No acute fracture of the lumbar spine.
2. Grade 1 L4-5 anterolisthesis due to severe facet arthrosis, which
may be a source of local low back pain.
3. Moderate spinal canal stenosis at L3-4 and mild spinal canal
stenosis at L2-3, L4-5 and L5-S1.
4. Moderate multilevel neural foraminal stenosis.

Aortic Atherosclerosis ([AV]-[AV]).

## 2021-03-17 IMAGING — CT CT HEAD W/O CM
3 series · 15 of 47 positions shown, 18 images · non-contrast
Comparison: None.

CLINICAL DATA: Delirium, multiple falls

EXAM:
CT HEAD WITHOUT CONTRAST
TECHNIQUE: Contiguous axial images were obtained from the base of the skull
through the vertex without intravenous contrast.

[Series 2: head wo · axial · 0.49mm/px · z∈[-155,-5]mm · 9 of 36 slices shown, 12 images]
[im 3/36  brain]
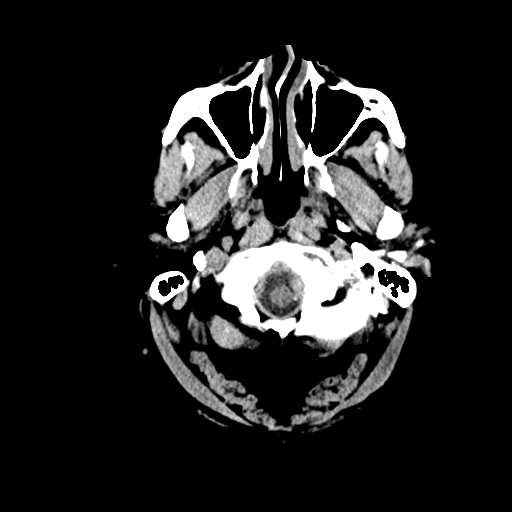
[im 3/36  bone]
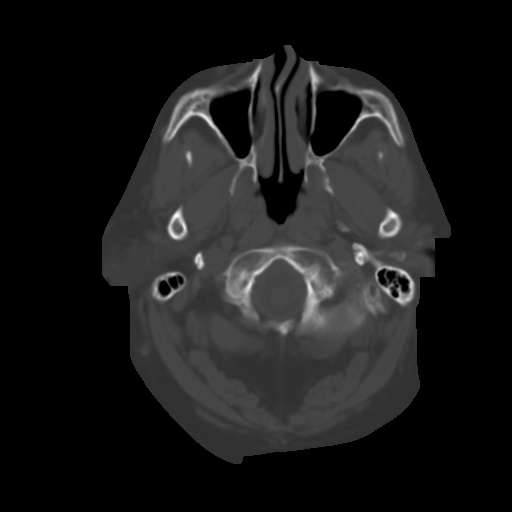
[im 7/36  brain]
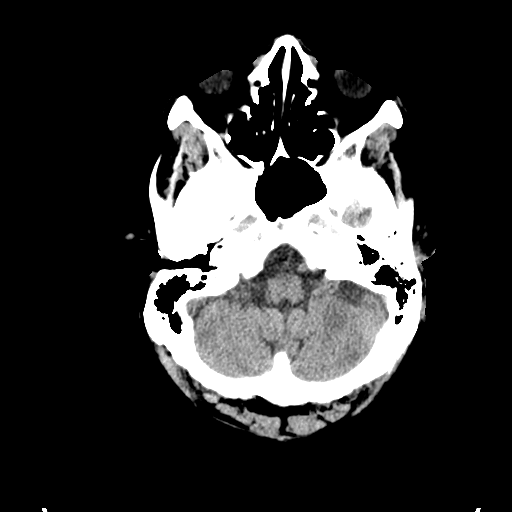
[im 10/36  brain]
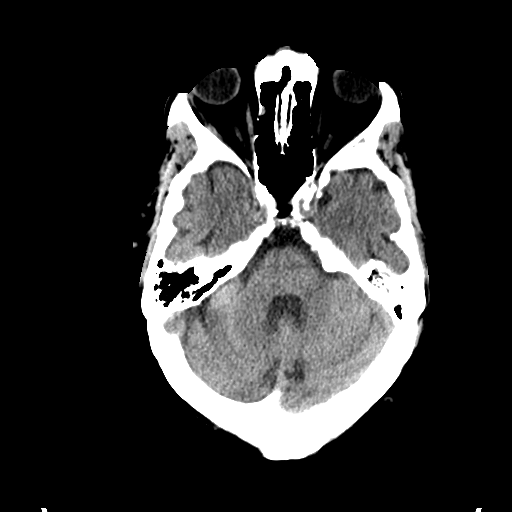
[im 14/36  brain]
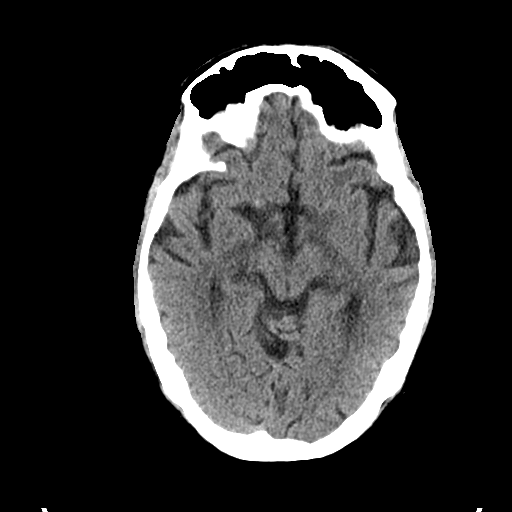
[im 19/36  brain]
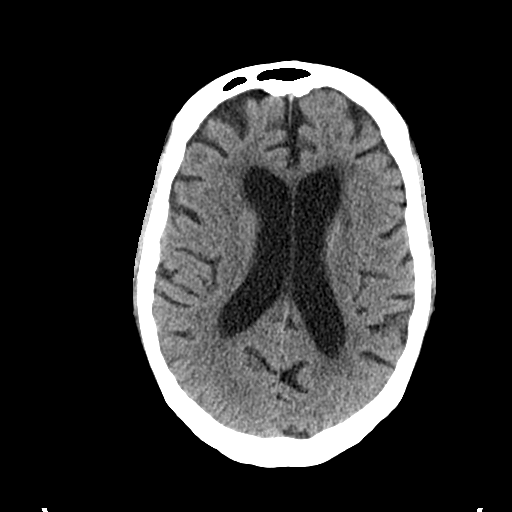
[im 19/36  bone]
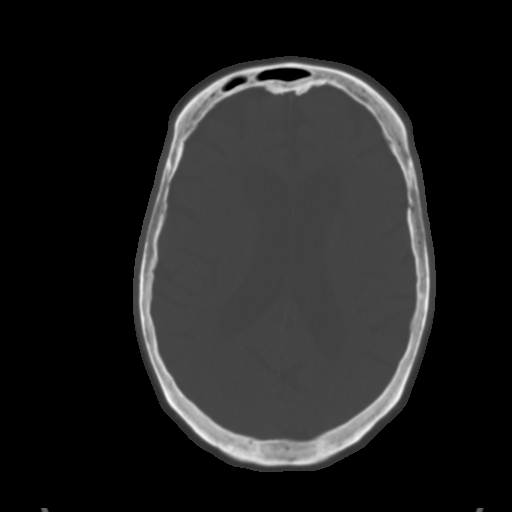
[im 22/36  brain]
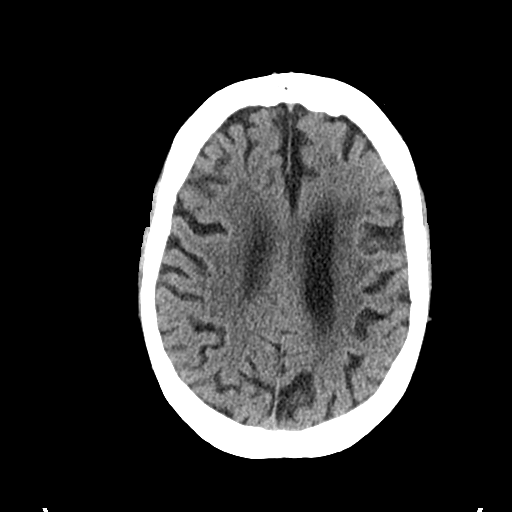
[im 26/36  brain]
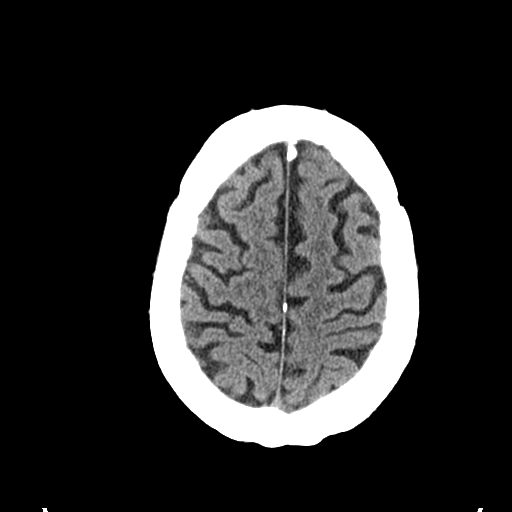
[im 29/36  brain]
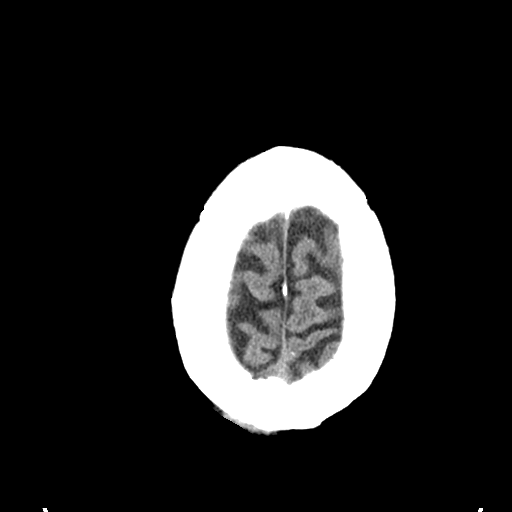
[im 33/36  brain]
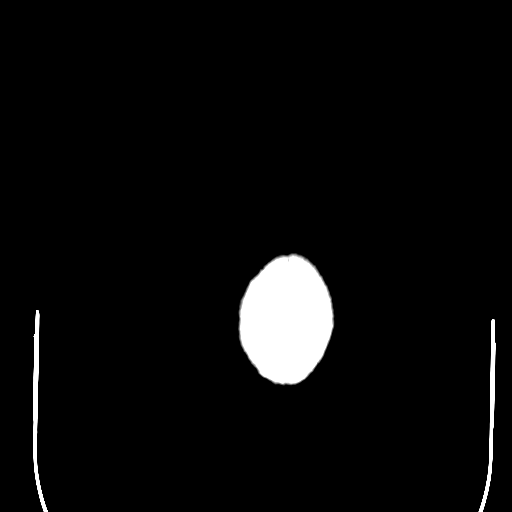
[im 33/36  bone]
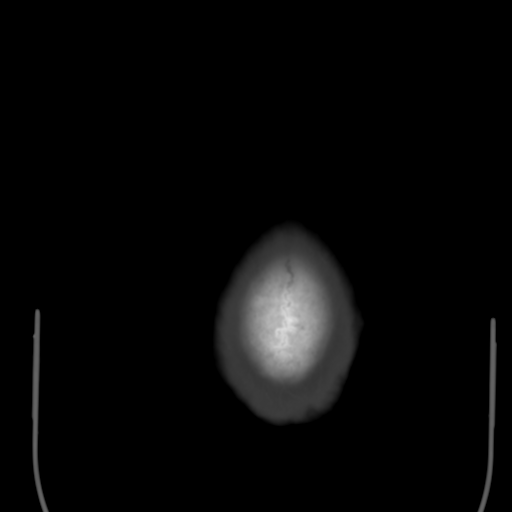

[Series 4: coronal soft tissue · coronal · 0.35mm/px · 3 of 79 slices shown]
[im 27/79  brain]
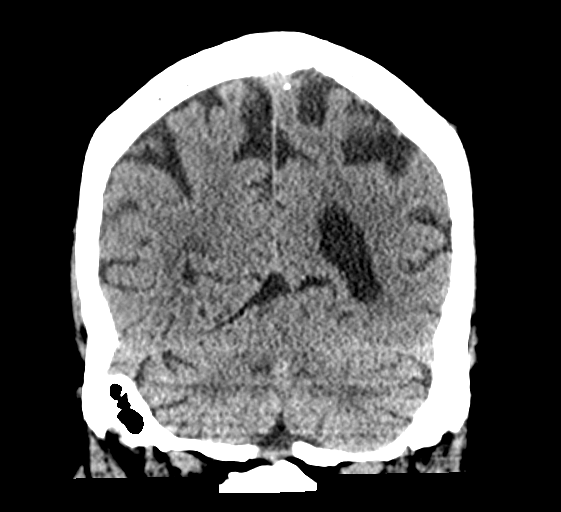
[im 35/79  brain]
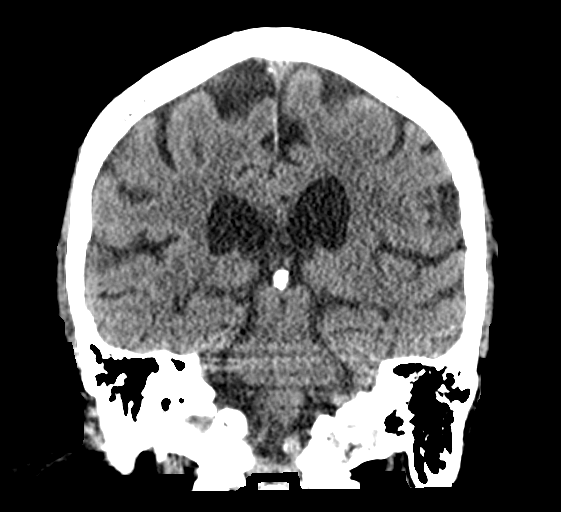
[im 44/79  brain]
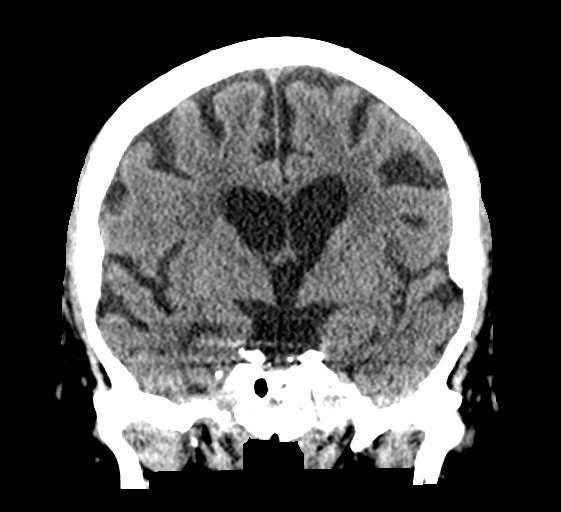

[Series 5: sagittal soft tissue · sagittal · 0.35mm/px · 3 of 63 slices shown]
[im 21/63  brain]
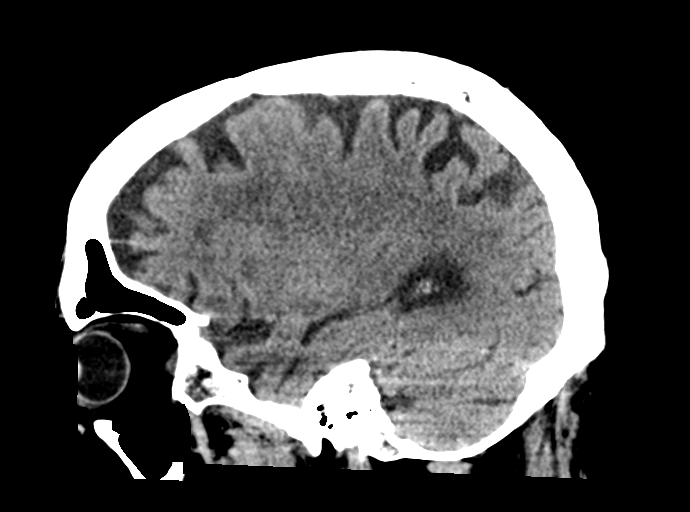
[im 32/63  brain]
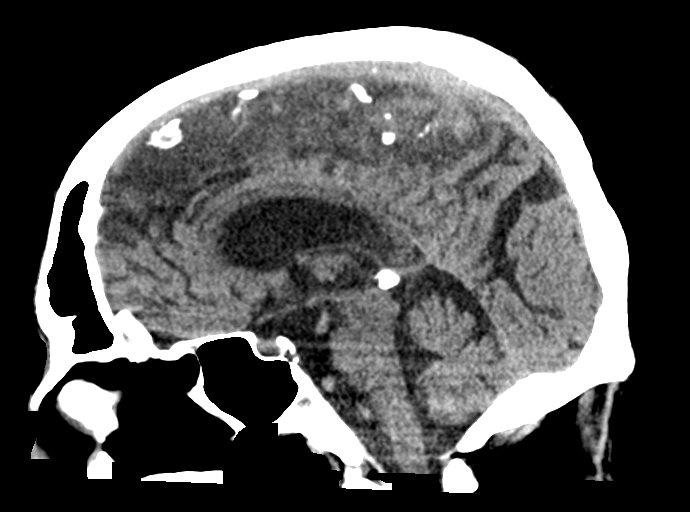
[im 42/63  brain]
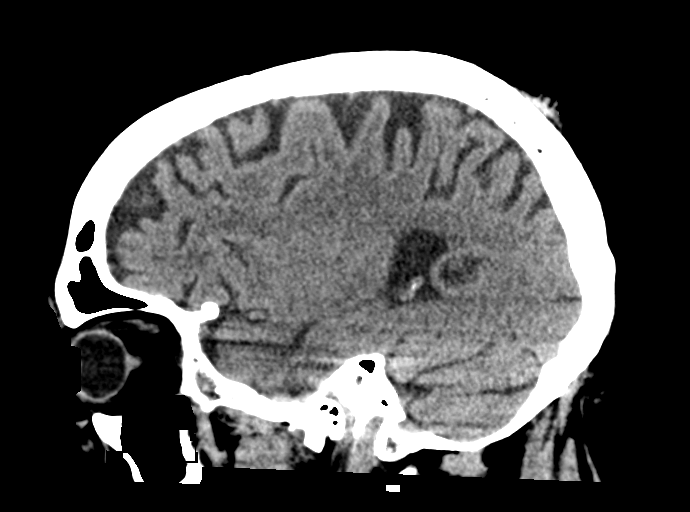

[15 of 47 positions shown; findings below may reference images not displayed]

FINDINGS: Brain: No evidence of acute infarction, hemorrhage, hydrocephalus,
extra-axial collection or mass lesion/mass effect. Mild-moderate
low-density changes within the periventricular and subcortical white
matter compatible with chronic microvascular ischemic change.
Mild-moderate diffuse cerebral volume loss.

Vascular: Atherosclerotic calcifications involving the large vessels
of the skull base. No unexpected hyperdense vessel.

Skull: Normal. Negative for fracture or focal lesion.

Sinuses/Orbits: No acute finding.

Other: Ill-defined scalp hematoma in the posterior right high
parietal region.
IMPRESSION: 1. No acute intracranial findings.
2. Ill-defined scalp hematoma in the posterior right high parietal
region. No underlying calvarial fracture.
3. Mild-moderate chronic microvascular ischemic change and cerebral
volume loss.

## 2021-03-17 MED ORDER — ONDANSETRON HCL 4 MG PO TABS: 4.0000 mg | ORAL_TABLET | Freq: Four times a day (QID) | ORAL | Status: DC | PRN

## 2021-03-17 MED ORDER — SODIUM CHLORIDE 0.9 % IV SOLN
1.0000 g | INTRAVENOUS | Status: DC
  Administered 2021-03-18 – 2021-03-22 (×4): 1 g via INTRAVENOUS
  Filled 2021-03-17 (×3): qty 1
  Filled 2021-03-17 (×2): qty 10
  Filled 2021-03-17: qty 1

## 2021-03-17 MED ORDER — ONDANSETRON HCL 4 MG/2ML IJ SOLN: 4.0000 mg | Freq: Four times a day (QID) | INTRAMUSCULAR | Status: DC | PRN

## 2021-03-17 MED ORDER — ACETAMINOPHEN 650 MG RE SUPP: 650.0000 mg | Freq: Four times a day (QID) | RECTAL | Status: DC | PRN

## 2021-03-17 MED ORDER — ENOXAPARIN SODIUM 80 MG/0.8ML ~~LOC~~ SOLN
0.5000 mg/kg | SUBCUTANEOUS | Status: DC
  Administered 2021-03-18 – 2021-03-26 (×9): 65 mg via SUBCUTANEOUS
  Filled 2021-03-17 (×9): qty 0.8

## 2021-03-17 MED ORDER — INSULIN ASPART 100 UNIT/ML ~~LOC~~ SOLN
0.0000 [IU] | Freq: Every day | SUBCUTANEOUS | Status: DC
  Administered 2021-03-20 – 2021-03-22 (×2): 2 [IU] via SUBCUTANEOUS
  Administered 2021-03-23: 3 [IU] via SUBCUTANEOUS
  Administered 2021-03-24: 2 [IU] via SUBCUTANEOUS
  Filled 2021-03-17 (×4): qty 1

## 2021-03-17 MED ORDER — SODIUM CHLORIDE 0.45 % IV SOLN: INTRAVENOUS | Status: DC

## 2021-03-17 MED ORDER — INSULIN ASPART 100 UNIT/ML ~~LOC~~ SOLN
0.0000 [IU] | Freq: Three times a day (TID) | SUBCUTANEOUS | Status: DC
  Administered 2021-03-18 (×2): 2 [IU] via SUBCUTANEOUS
  Administered 2021-03-18: 3 [IU] via SUBCUTANEOUS
  Administered 2021-03-19: 2 [IU] via SUBCUTANEOUS
  Administered 2021-03-19 (×2): 3 [IU] via SUBCUTANEOUS
  Administered 2021-03-20: 5 [IU] via SUBCUTANEOUS
  Administered 2021-03-20 – 2021-03-21 (×5): 3 [IU] via SUBCUTANEOUS
  Administered 2021-03-22: 8 [IU] via SUBCUTANEOUS
  Administered 2021-03-22: 3 [IU] via SUBCUTANEOUS
  Administered 2021-03-22: 5 [IU] via SUBCUTANEOUS
  Administered 2021-03-23: 3 [IU] via SUBCUTANEOUS
  Administered 2021-03-23 (×2): 5 [IU] via SUBCUTANEOUS
  Administered 2021-03-24: 8 [IU] via SUBCUTANEOUS
  Administered 2021-03-24: 5 [IU] via SUBCUTANEOUS
  Filled 2021-03-17 (×20): qty 1

## 2021-03-17 MED ORDER — SODIUM CHLORIDE 0.9 % IV SOLN
2.0000 g | Freq: Once | INTRAVENOUS | Status: AC
  Administered 2021-03-17: 2 g via INTRAVENOUS
  Filled 2021-03-17: qty 20

## 2021-03-17 MED ORDER — HALOPERIDOL LACTATE 5 MG/ML IJ SOLN
2.5000 mg | Freq: Once | INTRAMUSCULAR | Status: AC
  Administered 2021-03-17: 2.5 mg via INTRAVENOUS
  Filled 2021-03-17: qty 1

## 2021-03-17 MED ORDER — SODIUM CHLORIDE 0.9 % IV BOLUS
500.0000 mL | Freq: Once | INTRAVENOUS | Status: AC
  Administered 2021-03-17: 500 mL via INTRAVENOUS

## 2021-03-17 MED ORDER — ACETAMINOPHEN 325 MG PO TABS: 650.0000 mg | ORAL_TABLET | Freq: Four times a day (QID) | ORAL | Status: DC | PRN

## 2021-03-17 NOTE — ED Triage Notes (Signed)
Pt wife reports pt with increasing AMS and multiple falls. Pt extremely weak today.

## 2021-03-17 NOTE — Progress Notes (Signed)
PHARMACIST - PHYSICIAN COMMUNICATION  CONCERNING:  Enoxaparin (Lovenox) for DVT Prophylaxis    RECOMMENDATION: Patient was prescribed enoxaprin 40mg  q24 hours for VTE prophylaxis.   Filed Weights   03/17/21 1807  Weight: 129.7 kg (286 lb)    Body mass index is 38.79 kg/m.  Estimated Creatinine Clearance: 48.1 mL/min (A) (by C-G formula based on SCr of 1.79 mg/dL (H)).   Based on [place] patient is candidate for enoxaparin 0.5mg /kg TBW SQ every 24 hours based on BMI being >30.  DESCRIPTION: Pharmacy has adjusted enoxaparin dose per [facility] policy.  Patient is now receiving enoxaparin 0.5 mg/kg every 24 hours   Renda Rolls, PharmD, [facility] 03/17/2021 11:29 PM
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
## 2021-03-17 NOTE — H&P (Signed)
History and Physical   Norman Palmer VFI:[id number] DOB: 1941/05/07 DOA: 03/17/2021  Referring MD/NP/PA: Dr. Ellender Hose  PCP: Birdie Sons, MD   Outpatient Specialists: None  Patient coming from: Home  Chief Complaint: Altered mental status weakness and fall  HPI: Norman Palmer is a 80 y.o. 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  Patient lives with the wife and has been more aggressive lately.  Is also been weak and fatigued.  His confusion has gotten worse.  Patient went was mobile able to communicate a walk around but lately he has been unable to do that.  He has minimal assistant at baseline now.  He has had number of falls at home patient also had decreased appetite.  Patient has had prior history of UTIs.  In the ER again patient noted to have another episode of possible UTI.  Is being admitted for further evaluation and treatment..  ED Course: Temperature 98 for blood pressure 166/58 pulse 93, respiratory 23 oxygen sat 97% on room air.  White count 15.5 hemoglobin 10.9 platelets 292.  Sodium 130 potassium 4.3 with chloride 106.  CO2 of 21 BUN 58 creatinine 1.79 calcium 9.1.  Glucose is 145.  Urinalysis showed positive nitrite otherwise many bacteria with WBC only 0-5.  COVID-19 is negative.  Head CT without contrast is negative CT of the lumbar spine is negative.  Chest x-ray shows no acute findings.  Patient being admitted with suspected UTI and correspond to altered mental status.  Review of Systems: As per HPI otherwise 10 point review of systems negative.    Past Medical History:  Diagnosis Date  . Alzheimer disease ([place])   . Dementia ([place])   . Diabetes mellitus without complication ([place])   . H/O knee surgery 04/17/2015   Overview:  Bilateral knees surgery   . History of basal cell carcinoma (BCC) of skin 08/12/2019   MOHS Right alar  crease [facility] Dr. Manley Mason 07/2019  . Infection with methicillin-resistant Staphylococcus aureus 08/22/2015  . Insomnia   . Lumbago   . Sciatica   . Thyroid disease     Past Surgical History:  Procedure Laterality Date  . knee surgery Bilateral 2000  . MOHS SURGERY Right 08/11/2019   BCC alar crease- [facility] Dr. Manley Mason  . SKIN GRAFT Bilateral 1955   legs  . URETHRAL STRICTURE DILATATION       reports that he has quit smoking. His smoking use included cigarettes. He has a 60.00 pack-year smoking history. He has quit using smokeless tobacco. He reports current alcohol use of about 1.0 - 2.0 standard drink of alcohol per week. He reports that he does not use drugs.  Allergies  Allergen Reactions  . Doxycycline Rash  . Zinc Rash    Family History  Problem Relation Age of Onset  . Hypertension Father   . Cancer Brother        Liver Cancer     Prior to Admission medications   Medication Sig Start Date End Date Taking? Authorizing Provider  celecoxib (CELEBREX) 200 MG capsule Take 200 mg by mouth daily.  07/01/06   [provider]  donepezil (ARICEPT) 10 MG tablet Take 10 mg by mouth at bedtime.     [provider]  dutasteride (AVODART) 0.5 MG capsule TAKE 1 CAPSULE BY MOUTH ONCE DAILY Patient taking differently: Take 0.5 mg by mouth daily. 02/19/19  Birdie Sons, MD  glipiZIDE-metformin (METAGLIP) 2.5-500 MG tablet Take 1 tablet by mouth 2 (two) times daily before a meal. 1 Tablet 2 Times daily With Meals 09/21/15   Birdie Sons, MD  insulin NPH Human (HUMULIN N,NOVOLIN N) 100 UNIT/ML injection Inject 30 Units into the skin every evening.  10/17/16 03/17/18  [provider]  lisinopril (ZESTRIL) 20 MG tablet TAKE 1 TABLET BY MOUTH DAILY 01/28/21   Birdie Sons, MD  metFORMIN (GLUCOPHAGE) 1000 MG tablet TAKE ONE TABLET TWICE DAILY WITH MEALS Patient taking differently: Take 1,000 mg by mouth 2 (two) times daily with a meal. 07/11/19   Birdie Sons, MD   Potassium 99 MG TABS Take 1 tablet by mouth daily. 05/22/09   [provider]  sertraline (ZOLOFT) 50 MG tablet Take 50 mg by mouth daily. 11/03/19   [provider]  simvastatin (ZOCOR) 40 MG tablet Take 40 mg by mouth every evening.     [provider]  tamsulosin (FLOMAX) 0.4 MG CAPS capsule Take 1 capsule (0.4 mg total) by mouth daily. 10/28/16   Birdie Sons, MD  triamterene-hydrochlorothiazide (DYAZIDE) 37.5-25 MG capsule TAKE 1 CAPSULE BY MOUTH EACH MORNING 02/25/21   Birdie Sons, MD  Vitamin D, Ergocalciferol, (DRISDOL) 1.25 MG (50000 UNIT) CAPS capsule TAKE 1 CAPSULE BY MOUTH ONCE A WEEK 07/18/20   Birdie Sons, MD    Physical Exam: Vitals:   03/17/21 1830 03/17/21 1930 03/17/21 2030 03/17/21 2100  BP: 137/71 140/61 (!) 145/64 (!) 166/58  Pulse: 81 65 71 82  Resp: (!) 22 (!) 21 (!) 21 (!) 22  Temp:      TempSrc:      SpO2: 97% 98% 97% 98%  Weight:      Height:          Constitutional: Confused, mildly agitated, no distress Vitals:   03/17/21 1830 03/17/21 1930 03/17/21 2030 03/17/21 2100  BP: 137/71 140/61 (!) 145/64 (!) 166/58  Pulse: 81 65 71 82  Resp: (!) 22 (!) 21 (!) 21 (!) 22  Temp:      TempSrc:      SpO2: 97% 98% 97% 98%  Weight:      Height:       Eyes: PERRL, lids and conjunctivae normal ENMT: Mucous membranes are dry. Posterior pharynx clear of any exudate or lesions.Normal dentition.  Neck: normal, supple, no masses, no thyromegaly Respiratory: clear to auscultation bilaterally, no wheezing, no crackles. Normal respiratory effort. No accessory muscle use.  Cardiovascular: Regular rate and rhythm, no murmurs / rubs / gallops. No extremity edema. 2+ pedal pulses. No carotid bruits.  Abdomen: no tenderness, no masses palpated. No hepatosplenomegaly. Bowel sounds positive.  Musculoskeletal: no clubbing / cyanosis. No joint deformity upper and lower extremities. Good ROM, no contractures. Normal muscle tone.  Skin: no  rashes, lesions, ulcers. No induration Neurologic: CN 2-12 grossly intact. Sensation intact, DTR normal. Strength 5/5 in all 4.  Psychiatric: Confused, mildly agitated    Labs on Admission: I have personally reviewed following labs and imaging studies  CBC: Recent Labs  Lab 03/17/21 1806  WBC 15.5*  NEUTROABS 11.6*  HGB 10.9*  HCT 34.9*  MCV 95.6  PLT 426   Basic Metabolic Panel: Recent Labs  Lab 03/17/21 1806  NA 138  K 4.3  CL 106  CO2 21*  GLUCOSE 145*  BUN 58*  CREATININE 1.79*  CALCIUM 9.1   GFR: Estimated Creatinine Clearance: 48.1 mL/min (A) (by C-G  formula based on SCr of 1.79 mg/dL (H)). Liver Function Tests: Recent Labs  Lab 03/17/21 1806  AST 24  ALT 16  ALKPHOS 53  BILITOT 0.6  PROT 7.1  ALBUMIN 4.0   No results for input(s): LIPASE, AMYLASE in the last 168 hours. No results for input(s): AMMONIA in the last 168 hours. Coagulation Profile: No results for input(s): INR, PROTIME in the last 168 hours. Cardiac Enzymes: No results for input(s): CKTOTAL, CKMB, CKMBINDEX, TROPONINI in the last 168 hours. BNP (last 3 results) No results for input(s): PROBNP in the last 8760 hours. HbA1C: No results for input(s): HGBA1C in the last 72 hours. CBG: No results for input(s): GLUCAP in the last 168 hours. Lipid Profile: No results for input(s): CHOL, HDL, LDLCALC, TRIG, CHOLHDL, LDLDIRECT in the last 72 hours. Thyroid Function Tests: No results for input(s): TSH, T4TOTAL, FREET4, T3FREE, THYROIDAB in the last 72 hours. Anemia Panel: No results for input(s): VITAMINB12, FOLATE, FERRITIN, TIBC, IRON, RETICCTPCT in the last 72 hours. Urine analysis:    Component Value Date/Time   COLORURINE YELLOW (A) 03/17/2021 1942   APPEARANCEUR CLEAR (A) 03/17/2021 1942   LABSPEC 1.015 03/17/2021 1942   PHURINE 5.0 03/17/2021 1942   GLUCOSEU NEGATIVE 03/17/2021 1942   HGBUR NEGATIVE 03/17/2021 1942   BILIRUBINUR NEGATIVE 03/17/2021 1942   BILIRUBINUR Neg  10/13/2017 1126   KETONESUR NEGATIVE 03/17/2021 1942   PROTEINUR NEGATIVE 03/17/2021 1942   UROBILINOGEN 0.2 10/13/2017 1126   NITRITE POSITIVE (A) 03/17/2021 1942   LEUKOCYTESUR NEGATIVE 03/17/2021 1942   Sepsis Labs: @LABRCNTIP (procalcitonin:4,lacticidven:4) )No results found for this or any previous visit (from the past 240 hour(s)).   Radiological Exams on Admission: CT Head Wo Contrast  Result Date: 03/17/2021 CLINICAL DATA:  Delirium, multiple falls EXAM: CT HEAD WITHOUT CONTRAST TECHNIQUE: Contiguous axial images were obtained from the base of the skull through the vertex without intravenous contrast. COMPARISON:  None. FINDINGS: Brain: No evidence of acute infarction, hemorrhage, hydrocephalus, extra-axial collection or mass lesion/mass effect. Mild-moderate low-density changes within the periventricular and subcortical white matter compatible with chronic microvascular ischemic change. Mild-moderate diffuse cerebral volume loss. Vascular: Atherosclerotic calcifications involving the large vessels of the skull base. No unexpected hyperdense vessel. Skull: Normal. Negative for fracture or focal lesion. Sinuses/Orbits: No acute finding. Other: Ill-defined scalp hematoma in the posterior right high parietal region. IMPRESSION: 1. No acute intracranial findings. 2. Ill-defined scalp hematoma in the posterior right high parietal region. No underlying calvarial fracture. 3. Mild-moderate chronic microvascular ischemic change and cerebral volume loss. Electronically Signed   By: Davina Poke D.O.   On: 03/17/2021 20:06   CT Lumbar Spine Wo Contrast  Result Date: 03/17/2021 CLINICAL DATA:  Low back pain with multiple falls EXAM: CT LUMBAR SPINE WITHOUT CONTRAST TECHNIQUE: Multidetector CT imaging of the lumbar spine was performed without intravenous contrast administration. Multiplanar CT image reconstructions were also generated. COMPARISON:  None. FINDINGS: Segmentation: Standard Alignment:  Grade 1 anterolisthesis at L4-5 Vertebrae: No acute fracture or focal pathologic process. Paraspinal and other soft tissues: Calcific aortic atherosclerosis. Disc levels: L1-2: Mild facet hypertrophy.  No spinal canal stenosis. L2-3: Moderate facet hypertrophy.  Mild spinal canal stenosis. L3-4: Severe facet hypertrophy and small disc bulge. Moderate spinal canal stenosis. Moderate bilateral foraminal stenosis. L4-5: Severe facet hypertrophy with small disc bulge. Mild spinal canal stenosis. Mild right and moderate left foraminal stenosis. L5-S1: Severe facet hypertrophy with small disc bulge. No spinal canal stenosis. Moderate left foraminal stenosis. IMPRESSION: 1. No acute fracture of  the lumbar spine. 2. Grade 1 L4-5 anterolisthesis due to severe facet arthrosis, which may be a source of local low back pain. 3. 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 4. Moderate multilevel neural foraminal stenosis. Aortic Atherosclerosis (ICD10-I70.0). Electronically Signed   By: Ulyses Jarred M.D.   On: 03/17/2021 20:27   DG Chest Portable 1 View  Result Date: 03/17/2021 CLINICAL DATA:  Weakness and shortness of breath.  Multiple falls. EXAM: PORTABLE CHEST 1 VIEW COMPARISON:  Most recent radiograph 11/26/2019 FINDINGS: Lung volumes are low. Stable borderline cardiomegaly. Unchanged mediastinal contours. No pulmonary edema, acute airspace disease, large pleural effusion or pneumothorax. Degenerative change of both shoulders. No acute osseous abnormalities are seen. IMPRESSION: Low lung volumes without acute abnormality. Electronically Signed   By: Keith Rake M.D.   On: 03/17/2021 18:58      Assessment/Plan Principal Problem:   AMS (altered mental status) Active Problems:   BPH with obstruction/lower urinary tract symptoms   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[place])   Essential hypertension   Hyperlipemia   Obstructive sleep apnea   Acute lower  UTI     #1 altered mental status: Worsening confusion in the setting of Alzheimer's dementia.  Most likely active UTI.  Patient being admitted.  Treat underlying problem.  Slight agitation noted.  Haldol was given in the ER.  If agitation returns patient may be given more Haldol.  #2 UTI: Empirically started on Rocephin.  Urine cultures and blood cultures obtained.  Will follow results.  #3 diabetes: Appears well controlled.  Sliding scale insulin.  #4 AlZheimer's dementia: Confirm and resume home regimen.  Treat agitation as above.  #5 essential hypertension: Continue home regimen.  #6 hyperlipidemia: Continue with statin  #7 obstructive sleep apnea: Possible CPAP.  #8 history of BPH: Continue home regimen  #9 chronic kidney disease stage III: Monitor renal function  DVT prophylaxis: Lovenox Code Status: Full code Family Communication: Son at bedside Disposition Plan: Home Consults called: None Admission status: Inpatient  Severity of Illness: The appropriate patient status for this patient is INPATIENT. Inpatient status is judged to be reasonable and necessary in order to provide the required intensity of service to ensure the patient's safety. The patient's presenting symptoms, physical exam findings, and initial radiographic and laboratory data in the context of their chronic comorbidities is felt to place them at high risk for further clinical deterioration. Furthermore, it is not anticipated that the patient will be medically stable for discharge from the hospital within 2 midnights of admission. The following factors support the patient status of inpatient.   " The patient's presenting symptoms include confusion. " The worrisome physical exam findings include mild agitation. " The initial radiographic and laboratory data are worrisome because of confusion. " The chronic co-morbidities include dementia.   * I certify that at the point of admission it is my clinical  judgment that the patient will require inpatient hospital care spanning beyond 2 midnights from the point of admission due to high intensity of service, high risk for further deterioration and high frequency of surveillance required.Barbette Merino MD [facility] Pager [phone]  If 7PM-7AM, please contact night-coverage [email or web address] Password [email or web address]  03/17/2021, 9:22 PM

## 2021-03-17 NOTE — ED Notes (Signed)
ED Provider at bedside. 

## 2021-03-18 ENCOUNTER — Encounter: Payer: Self-pay | Admitting: Internal Medicine

## 2021-03-18 DIAGNOSIS — E119 Type 2 diabetes mellitus without complications: Secondary | ICD-10-CM | POA: Diagnosis not present

## 2021-03-18 DIAGNOSIS — N401 Enlarged prostate with lower urinary tract symptoms: Secondary | ICD-10-CM | POA: Diagnosis not present

## 2021-03-18 DIAGNOSIS — N138 Other obstructive and reflux uropathy: Secondary | ICD-10-CM | POA: Diagnosis not present

## 2021-03-18 DIAGNOSIS — N390 Urinary tract infection, site not specified: Secondary | ICD-10-CM | POA: Diagnosis not present

## 2021-03-18 LAB — CBC
HCT: 31.4 % — ABNORMAL LOW (ref 39.0–52.0)
Hemoglobin: 10.2 g/dL — ABNORMAL LOW (ref 13.0–17.0)
MCH: 30.7 pg (ref 26.0–34.0)
MCHC: 32.5 g/dL (ref 30.0–36.0)
MCV: 94.6 fL (ref 80.0–100.0)
Platelets: 260 10*3/uL (ref 150–400)
RBC: 3.32 MIL/uL — ABNORMAL LOW (ref 4.22–5.81)
RDW: 12.6 % (ref 11.5–15.5)
WBC: 11.6 10*3/uL — ABNORMAL HIGH (ref 4.0–10.5)
nRBC: 0 % (ref 0.0–0.2)

## 2021-03-18 LAB — COMPREHENSIVE METABOLIC PANEL
ALT: 13 U/L (ref 0–44)
AST: 18 U/L (ref 15–41)
Albumin: 3.4 g/dL — ABNORMAL LOW (ref 3.5–5.0)
Alkaline Phosphatase: 46 U/L (ref 38–126)
Anion gap: 8 (ref 5–15)
BUN: 52 mg/dL — ABNORMAL HIGH (ref 8–23)
CO2: 22 mmol/L (ref 22–32)
Calcium: 8.7 mg/dL — ABNORMAL LOW (ref 8.9–10.3)
Chloride: 106 mmol/L (ref 98–111)
Creatinine, Ser: 1.76 mg/dL — ABNORMAL HIGH (ref 0.61–1.24)
GFR, Estimated: 39 mL/min — ABNORMAL LOW (ref >60)
Glucose, Bld: 140 mg/dL — ABNORMAL HIGH (ref 70–99)
Potassium: 4.5 mmol/L (ref 3.5–5.1)
Sodium: 136 mmol/L (ref 135–145)
Total Bilirubin: 0.5 mg/dL (ref 0.3–1.2)
Total Protein: 6.3 g/dL — ABNORMAL LOW (ref 6.5–8.1)

## 2021-03-18 LAB — GLUCOSE, CAPILLARY
Glucose-Capillary: 149 mg/dL — ABNORMAL HIGH (ref 70–99)
Glucose-Capillary: 188 mg/dL — ABNORMAL HIGH (ref 70–99)
Glucose-Capillary: 138 mg/dL — ABNORMAL HIGH (ref 70–99)
Glucose-Capillary: 140 mg/dL — ABNORMAL HIGH (ref 70–99)

## 2021-03-18 LAB — HEMOGLOBIN A1C
Hgb A1c MFr Bld: 6.8 % — ABNORMAL HIGH (ref 4.8–5.6)
Mean Plasma Glucose: 148.46 mg/dL

## 2021-03-18 MED ORDER — DUTASTERIDE 0.5 MG PO CAPS
0.5000 mg | ORAL_CAPSULE | Freq: Every day | ORAL | Status: DC
  Administered 2021-03-19 – 2021-03-26 (×8): 0.5 mg via ORAL
  Filled 2021-03-18 (×8): qty 1

## 2021-03-18 MED ORDER — SIMVASTATIN 40 MG PO TABS
40.0000 mg | ORAL_TABLET | Freq: Every evening | ORAL | Status: DC
  Administered 2021-03-18 – 2021-03-25 (×8): 40 mg via ORAL
  Filled 2021-03-18 (×8): qty 1

## 2021-03-18 MED ORDER — HYDRALAZINE HCL 25 MG PO TABS
25.0000 mg | ORAL_TABLET | Freq: Three times a day (TID) | ORAL | Status: DC | PRN
  Administered 2021-03-23 – 2021-03-26 (×2): 25 mg via ORAL
  Filled 2021-03-18 (×2): qty 1

## 2021-03-18 MED ORDER — SERTRALINE HCL 50 MG PO TABS
50.0000 mg | ORAL_TABLET | Freq: Every day | ORAL | Status: DC
  Administered 2021-03-18 – 2021-03-26 (×9): 50 mg via ORAL
  Filled 2021-03-18 (×9): qty 1

## 2021-03-18 MED ORDER — LISINOPRIL 20 MG PO TABS
20.0000 mg | ORAL_TABLET | Freq: Every day | ORAL | Status: DC
  Administered 2021-03-18 – 2021-03-23 (×6): 20 mg via ORAL
  Filled 2021-03-18 (×6): qty 1

## 2021-03-18 MED ORDER — DONEPEZIL HCL 5 MG PO TABS
10.0000 mg | ORAL_TABLET | Freq: Every day | ORAL | Status: DC
  Administered 2021-03-18 – 2021-03-25 (×8): 10 mg via ORAL
  Filled 2021-03-18 (×9): qty 2

## 2021-03-18 MED ORDER — RISPERIDONE 0.25 MG PO TABS
0.2500 mg | ORAL_TABLET | Freq: Two times a day (BID) | ORAL | Status: DC
  Administered 2021-03-18 – 2021-03-21 (×7): 0.25 mg via ORAL
  Filled 2021-03-18 (×7): qty 1

## 2021-03-18 MED ORDER — TAMSULOSIN HCL 0.4 MG PO CAPS
0.4000 mg | ORAL_CAPSULE | Freq: Every day | ORAL | Status: DC
  Administered 2021-03-18 – 2021-03-26 (×9): 0.4 mg via ORAL
  Filled 2021-03-18 (×9): qty 1

## 2021-03-18 NOTE — Hospital Course (Signed)
[name] is a 80 y.o. 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  Pt lives with wife, has baseline dementia with worsening confusion, agitation and aggressiveness.  At baseline, pt requires minimal assistance but wife says unable to stand or walk prior to presenting.  Also with poor appetite or decreased PO intake.  Has hx of UTI's.  Found to have UTI on evaluation in the ED, likely the etiology of his altered mentation and weakness.    Admitted to hospital and started on empiric Rocephin pending cultures.  PT and OT evaluated patient and are recommending SNF for rehab on discharge.

## 2021-03-18 NOTE — Evaluation (Signed)
Physical Therapy Evaluation Patient Details Name: Norman Palmer MRN: [id number] DOB: 1941/06/16 Today's Date: 03/18/2021   History of Present Illness  Pt is a 80 yo male admitted to ED due to suspect UTI, at home has been experiencing increased weakness, falls, and requiring more assist as well as increased confusion. Previously able to ambulate. PMH of dementia, DM, thyroid disease.    Clinical Impression  Patient alert, disoriented to place, situation. Family at bedside reported he has not returned to his cognitive baseline yet. Denied pain, but did exhibit mild pain signs/symptoms with pericare during session. Per family up until 2-3wks ago, pt ambulatory, I for ADLs (assist for lower body dressing PRN). 3 falls in the last 2 days.   The patient was able to move all extremities against gravity, though effortful on RLE compared to LLE. Supine to sit maxAx2, once positioned in midline the patient was able to maintain seated balance with supervision. Sit <> stand with RW and modA (x2 for safety), but with one attempt to step towards [facility] pt spontaneously returned to sitting due to fatigue. Declined further mobility attempts. Returned to supine and rollnig with maxAx2 performed due to BM. Pt bed place in chair position, and environment adjusted to maximize stimulation during the day.  Overall the patient demonstrated deficits (see "PT Problem List") that impede the patient's functional abilities, safety, and mobility and would benefit from skilled PT intervention. Recommendation is SNF due to acute decline in functional mobility and current level of assistance needed.     Follow Up Recommendations SNF    Equipment Recommendations  None recommended by PT    Recommendations for Other Services       Precautions / Restrictions Precautions Precautions: Fall Restrictions Weight Bearing Restrictions: No      Mobility  Bed Mobility Overal bed mobility: Needs Assistance Bed Mobility:  Rolling;Supine to Sit;Sit to Supine Rolling: +2 for physical assistance;Max assist   Supine to sit: Max assist;+2 for physical assistance Sit to supine: Max assist;+2 for physical assistance        Transfers Overall transfer level: Needs assistance Equipment used: Rolling walker (2 wheeled) Transfers: Sit to/from Stand Sit to Stand: Mod assist         General transfer comment: heavy reliance on RW, modA from PT to assist  Ambulation/Gait   Gait Distance (Feet): 1 Feet         General Gait Details: attempetd 1 side step at EOB, modA to complete, and pt sat spontaneously after attempt, appeared fatigued  Stairs            Wheelchair Mobility    Modified Rankin (Stroke Patients Only)       Balance Overall balance assessment: Needs assistance   Sitting balance-Leahy Scale: Fair Sitting balance - Comments: able to sit with supervision once assisted to midline position     Standing balance-Leahy Scale: Poor Standing balance comment: reliant on RW and assist for PT to maintain standing                             Pertinent Vitals/Pain Pain Assessment: Faces Faces Pain Scale: Hurts a little bit Pain Location: with pericare Pain Descriptors / Indicators: Grimacing Pain Intervention(s): Limited activity within patient's tolerance;Monitored during session;Repositioned    Home Living Family/patient expects to be discharged to:: Private residence Living Arrangements: Spouse/significant other;Children Available Help at Discharge: Family;Available 24 hours/day Type of Home: Mobile home Home Access: Stairs to enter  Entrance Stairs-Rails: None Entrance Stairs-Number of Steps: 1 Home Layout: One level Home Equipment: Wheelchair - [employer] - 2 wheels;Tub bench;Cane - single point;Grab bars - tub/shower      Prior Function Level of Independence: Needs assistance   Gait / Transfers Assistance Needed: up until 2-3 weeks ago, pt ambulatory I. Last  couple weeks/two days pt with significantly more difficulty, pt reported staggering, grabbing furniture. 3 falls in two days prior to admission  ADL's / Homemaking Assistance Needed: assistance for LE dressing at baseline, increased assistance for all ADLs in the last several days/week        Hand Dominance   Dominant Hand: Right    Extremity/Trunk Assessment   Upper Extremity Assessment Upper Extremity Assessment: Generalized weakness (shoulder flexion bilaterall 4-5, elbow flex/extend strength 4+/5)    Lower Extremity Assessment Lower Extremity Assessment: Generalized weakness (able to lift against gravity, effortful)    Cervical / Trunk Assessment Cervical / Trunk Assessment: Normal  Communication   Communication: No difficulties  Cognition Arousal/Alertness: Awake/alert Behavior During Therapy: WFL for tasks assessed/performed Overall Cognitive Status: Impaired/Different from baseline Area of Impairment: Orientation;Following commands;Safety/judgement;Awareness;Problem solving                 Orientation Level: Disoriented to;Place;Situation     Following Commands: Follows one step commands consistently Safety/Judgement: Decreased awareness of safety;Decreased awareness of deficits     General Comments: family reported pt cognitive status still not at baseline      General Comments      Exercises     Assessment/Plan    PT Assessment Patient needs continued PT services  PT Problem List Decreased strength;Decreased range of motion;Decreased activity tolerance;Decreased balance;Decreased mobility;Decreased knowledge of use of DME;Decreased safety awareness;Decreased knowledge of precautions       PT Treatment Interventions DME instruction;Balance training;Gait training;Neuromuscular re-education;Stair training;Functional mobility training;Patient/family education;Therapeutic activities;Therapeutic exercise    PT Goals (Current goals can be found in the  Care Plan section)  Acute Rehab PT Goals Patient Stated Goal: to go home PT Goal Formulation: With patient Time For Goal Achievement: 04/01/21 Potential to Achieve Goals: Good    Frequency Min 2X/week   Barriers to discharge        Co-evaluation               AM-PAC PT "6 Clicks" Mobility  Outcome Measure Help needed turning from your back to your side while in a flat bed without using bedrails?: Total Help needed moving from lying on your back to sitting on the side of a flat bed without using bedrails?: Total Help needed moving to and from a bed to a chair (including a wheelchair)?: Total Help needed standing up from a chair using your arms (e.g., wheelchair or bedside chair)?: A Lot Help needed to walk in hospital room?: Total Help needed climbing 3-5 steps with a railing? : Total 6 Click Score: 7    End of Session Equipment Utilized During Treatment: Gait belt Activity Tolerance: Patient limited by fatigue Patient left: in bed;with call bell/phone within reach;with family/visitor present;with bed alarm set (bed in chair position) Nurse Communication: Mobility status PT Visit Diagnosis: Other abnormalities of gait and mobility (R26.89);Muscle weakness (generalized) (M62.81);Difficulty in walking, not elsewhere classified (R26.2)    Time: 6503-5465 PT Time Calculation (min) (ACUTE ONLY): 44 min   Charges:   PT Evaluation $PT Eval Low Complexity: 1 Low PT Treatments $Therapeutic Exercise: 8-22 mins $Therapeutic Activity: 23-37 mins        Lieutenant Diego PT,  DPT 3:46 PM,03/18/21

## 2021-03-18 NOTE — Progress Notes (Signed)
PROGRESS NOTE    Norman Palmer   MEQ:[id number]  DOB: 1941-01-06  PCP: Birdie Sons, MD    DOA: 03/17/2021 LOS: 1   Brief Narrative    Norman Palmer is a 80 y.o. 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  Pt lives with wife, has baseline dementia with worsening confusion, agitation and aggressiveness.  At baseline, pt requires minimal assistance but wife says unable to stand or walk prior to presenting.  Also with poor appetite or decreased PO intake.  Has hx of UTI's.  Found to have UTI on evaluation in the ED, likely the etiology of his altered mentation and weakness.    Admitted to hospital and started on empiric Rocephin pending cultures.  PT and OT evaluated patient and are recommending SNF for rehab on discharge.    Assessment & Plan   Principal Problem:   AMS (altered mental status) Active Problems:   BPH with obstruction/lower urinary tract symptoms   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[place])   Essential hypertension   Hyperlipemia   Obstructive sleep apnea   Acute lower UTI   UTI -present on admission. --Continue empiric Rocephin --Follow urine and blood cultures  Acute infectious encephalopathy superimposed on baseline dementia -likely due to UTI.  Treat underlying infection as above.  Patient received Tylenol in the ER. Head CT was negative for anything acute. --Continued on home Aricept, Risperdal and Zoloft. --If agitation worsens, would use Haldol --Avoid benzos and other sedating meds ----Delirium precautions:     -Lights and TV off, minimize interruptions at night    -Blinds open and lights on during day    -Glasses/hearing aid with patient    -Frequent reorientation    -PT/OT when able  Type 2 diabetes -appears controlled.  Home regimen Metformin and  Humulin NPH insulin 30 units each morning --Cover with sliding scale NovoLog --May resume basal tomorrow if indicated, CBG's controlled  Essential hypertension -continued on home lisinopril 20 mg. Hold HCTZ-triamterene for now. PRN hydralazine  Hyperlipidemia -continue home statin  Obstructive sleep apnea -CPAP ordered  BPH -continue home Flomax and Avodart  Obesity: Body mass index is 38.79 kg/m.  Complicates overall care and prognosis.  Recommend lifestyle modifications including physical activity and diet for weight loss and overall long-term health.   DVT prophylaxis:    Diet:  Diet Orders (From admission, onward)    Start     Ordered   03/17/21 2234  Diet heart healthy/carb modified Room service appropriate? Yes; Fluid consistency: Thin  Diet effective now       Question Answer Comment  Diet-HS Snack? Nothing   Room service appropriate? Yes   Fluid consistency: Thin      03/17/21 2233            Code Status: Full Code    Subjective 03/18/21    Patient seen with wife at bedside today.  He reports feeling fine and has no acute complaints.  Says he is able to walk however wife reports he was unable to stand up prior to coming to hospital and has not yet been out of bed.  She describes his baseline dementia and recent increased agitation.  During encounter, patient briefly gets agitated and curses at wife.  He was redirectable however.  Has poor insight to his illness and current weakness.  Disposition Plan & Communication   Status is: Inpatient  Inpatient status appropriate due to severity of illness requiring IV therapies as above.  On empiric antibiotics pending cultures.  Dispo: The patient is from: Home              Anticipated d/c is to: SNF              Patient currently not medically stable for discharge   Difficult to place patient no   Family Communication: wife at bedside on rounds today 3/27   Consults, Procedures, Significant Events    Consultants:   none  Procedures:   none  Antimicrobials:  Anti-infectives (From admission, onward)   Start     Dose/Rate Route Frequency Ordered Stop   03/18/21 2100  cefTRIAXone (ROCEPHIN) 1 g in sodium chloride 0.9 % 100 mL IVPB        1 g 200 mL/hr over 30 Minutes Intravenous Every 24 hours 03/17/21 2233     03/17/21 2045  cefTRIAXone (ROCEPHIN) 2 g in sodium chloride 0.9 % 100 mL IVPB        2 g 200 mL/hr over 30 Minutes Intravenous  Once 03/17/21 2043 03/17/21 2153        Micro    Objective   Vitals:   03/18/21 0425 03/18/21 0822 03/18/21 1151 03/18/21 1615  BP: (!) 161/63 (!) 162/74 (!) 162/62 (!) 146/66  Pulse: 77 72 72 74  Resp: 20 20 18 18   Temp: 98.3 F (36.8 C) 98.5 F (36.9 C) 98.7 F (37.1 C) 98.4 F (36.9 C)  TempSrc: Oral Oral Oral   SpO2: 97% 98% 96% 95%  Weight:      Height:        Intake/Output Summary (Last 24 hours) at 03/18/2021 1639 Last data filed at 03/18/2021 1016 Gross per 24 hour  Intake 1025.84 ml  Output 300 ml  Net 725.84 ml   Filed Weights   03/17/21 1807  Weight: 129.7 kg    Physical Exam:  General exam: awake, alert, no acute distress, obese, confused HEENT: clear conjunctiva, anicteric sclera, moist mucus membranes, hearing grossly normal  Respiratory system: CTAB, no wheezes, rales or rhonchi, normal respiratory effort. Cardiovascular system: normal S1/S2, RRR, 1-2+ lower extremity edema.   Gastrointestinal system: protuberant abdomen, nontender, +bowel sounds. Central nervous system: no gross focal neurologic deficits, normal speech Skin: dry, intact, normal temperature Psychiatry: confused, normal mood but moments of agitation only expressed verbally and directed at his wife, congruent affect, abnormal judgement and insight  Labs   Data Reviewed: I have personally reviewed following labs and imaging studies  CBC: Recent Labs  Lab 03/17/21 1806 03/18/21 0504  WBC 15.5* 11.6*  NEUTROABS 11.6*  --   HGB  10.9* 10.2*  HCT 34.9* 31.4*  MCV 95.6 94.6  PLT 292 416   Basic Metabolic Panel: Recent Labs  Lab 03/17/21 1806 03/18/21 0504  NA 138 136  K 4.3 4.5  CL 106 106  CO2 21* 22  GLUCOSE 145* 140*  BUN 58* 52*  CREATININE 1.79* 1.76*  CALCIUM 9.1 8.7*   GFR: Estimated Creatinine Clearance: 48.9 mL/min (A) (by C-G formula based on SCr of 1.76 mg/dL (H)). Liver Function Tests: Recent Labs  Lab 03/17/21 1806 03/18/21 0504  AST 24 18  ALT 16 13  ALKPHOS 53 46  BILITOT 0.6 0.5  PROT 7.1 6.3*  ALBUMIN 4.0 3.4*   No results for input(s): LIPASE, AMYLASE in the last 168 hours. No results for  input(s): AMMONIA in the last 168 hours. Coagulation Profile: No results for input(s): INR, PROTIME in the last 168 hours. Cardiac Enzymes: No results for input(s): CKTOTAL, CKMB, CKMBINDEX, TROPONINI in the last 168 hours. BNP (last 3 results) No results for input(s): PROBNP in the last 8760 hours. HbA1C: Recent Labs    03/17/21 1806  HGBA1C 6.8*   CBG: Recent Labs  Lab 03/17/21 2238 03/18/21 0812 03/18/21 1150 03/18/21 1612  GLUCAP 186* 149* 188* 138*   Lipid Profile: No results for input(s): CHOL, HDL, LDLCALC, TRIG, CHOLHDL, LDLDIRECT in the last 72 hours. Thyroid Function Tests: No results for input(s): TSH, T4TOTAL, FREET4, T3FREE, THYROIDAB in the last 72 hours. Anemia Panel: No results for input(s): VITAMINB12, FOLATE, FERRITIN, TIBC, IRON, RETICCTPCT in the last 72 hours. Sepsis Labs: No results for input(s): PROCALCITON, LATICACIDVEN in the last 168 hours.  Recent Results (from the past 240 hour(s))  Resp Panel by RT-PCR (Flu A&B, Covid) Nasopharyngeal Swab     Status: None   Collection Time: 03/17/21  8:49 PM   Specimen: Nasopharyngeal Swab; Nasopharyngeal(NP) swabs in vial transport medium  Result Value Ref Range Status   SARS Coronavirus 2 by RT PCR NEGATIVE NEGATIVE Final    Comment: (NOTE) SARS-CoV-2 target nucleic acids are NOT DETECTED.  The  SARS-CoV-2 RNA is generally detectable in upper respiratory specimens during the acute phase of infection. The lowest concentration of SARS-CoV-2 viral copies this assay can detect is 138 copies/mL. A negative result does not preclude SARS-Cov-2 infection and should not be used as the sole basis for treatment or other patient management decisions. A negative result may occur with  improper specimen collection/handling, submission of specimen other than nasopharyngeal swab, presence of viral mutation(s) within the areas targeted by this assay, and inadequate number of viral copies(<138 copies/mL). A negative result must be combined with clinical observations, patient history, and epidemiological information. The expected result is Negative.  Fact Sheet for Patients:  EntrepreneurPulse.com.au  Fact Sheet for Healthcare Providers:  IncredibleEmployment.be  This test is no t yet approved or cleared by the [place] FDA and  has been authorized for detection and/or diagnosis of SARS-CoV-2 by FDA under an Emergency Use Authorization (EUA). This EUA will remain  in effect (meaning this test can be used) for the duration of the COVID-19 declaration under Section 564(b)(1) of the Act, 21 U.S.C.section 360bbb-3(b)(1), unless the authorization is terminated  or revoked sooner.       Influenza A by PCR NEGATIVE NEGATIVE Final   Influenza B by PCR NEGATIVE NEGATIVE Final    Comment: (NOTE) The Xpert Xpress SARS-CoV-2/FLU/RSV plus assay is intended as an aid in the diagnosis of influenza from Nasopharyngeal swab specimens and should not be used as a sole basis for treatment. Nasal washings and aspirates are unacceptable for Xpert Xpress SARS-CoV-2/FLU/RSV testing.  Fact Sheet for Patients: EntrepreneurPulse.com.au  Fact Sheet for Healthcare Providers: IncredibleEmployment.be  This test is not yet approved or  cleared by the [place] FDA and has been authorized for detection and/or diagnosis of SARS-CoV-2 by FDA under an Emergency Use Authorization (EUA). This EUA will remain in effect (meaning this test can be used) for the duration of the COVID-19 declaration under Section 564(b)(1) of the Act, 21 U.S.C. section 360bbb-3(b)(1), unless the authorization is terminated or revoked.  Performed at [facility], [address] [place], [place] [address]       Imaging Studies   CT Head Wo Contrast  Result Date: 03/17/2021 CLINICAL DATA:  Delirium, multiple falls EXAM: CT HEAD WITHOUT CONTRAST TECHNIQUE: Contiguous axial images were obtained from the base of the skull through the vertex without intravenous contrast. COMPARISON:  None. FINDINGS: Brain: No evidence of acute infarction, hemorrhage, hydrocephalus, extra-axial collection or mass lesion/mass effect. Mild-moderate low-density changes within the periventricular and subcortical white matter compatible with chronic microvascular ischemic change. Mild-moderate diffuse cerebral volume loss. Vascular: Atherosclerotic calcifications involving the large vessels of the skull base. No unexpected hyperdense vessel. Skull: Normal. Negative for fracture or focal lesion. Sinuses/Orbits: No acute finding. Other: Ill-defined scalp hematoma in the posterior right high parietal region. IMPRESSION: 1. No acute intracranial findings. 2. Ill-defined scalp hematoma in the posterior right high parietal region. No underlying calvarial fracture. 3. Mild-moderate chronic microvascular ischemic change and cerebral volume loss. Electronically Signed   By: Davina Poke D.O.   On: 03/17/2021 20:06   CT Lumbar Spine Wo Contrast  Result Date: 03/17/2021 CLINICAL DATA:  Low back pain with multiple falls EXAM: CT LUMBAR SPINE WITHOUT CONTRAST TECHNIQUE: Multidetector CT imaging of the lumbar spine was performed without intravenous contrast administration.  Multiplanar CT image reconstructions were also generated. COMPARISON:  None. FINDINGS: Segmentation: Standard Alignment: Grade 1 anterolisthesis at L4-5 Vertebrae: No acute fracture or focal pathologic process. Paraspinal and other soft tissues: Calcific aortic atherosclerosis. Disc levels: L1-2: Mild facet hypertrophy.  No spinal canal stenosis. L2-3: Moderate facet hypertrophy.  Mild spinal canal stenosis. L3-4: Severe facet hypertrophy and small disc bulge. Moderate spinal canal stenosis. Moderate bilateral foraminal stenosis. L4-5: Severe facet hypertrophy with small disc bulge. Mild spinal canal stenosis. Mild right and moderate left foraminal stenosis. L5-S1: Severe facet hypertrophy with small disc bulge. No spinal canal stenosis. Moderate left foraminal stenosis. IMPRESSION: 1. No acute fracture of the lumbar spine. 2. Grade 1 L4-5 anterolisthesis due to severe facet arthrosis, which may be a source of local low back pain. 3. 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 4. Moderate multilevel neural foraminal stenosis. Aortic Atherosclerosis (ICD10-I70.0). Electronically Signed   By: Ulyses Jarred M.D.   On: 03/17/2021 20:27   DG Chest Portable 1 View  Result Date: 03/17/2021 CLINICAL DATA:  Weakness and shortness of breath.  Multiple falls. EXAM: PORTABLE CHEST 1 VIEW COMPARISON:  Most recent radiograph 11/26/2019 FINDINGS: Lung volumes are low. Stable borderline cardiomegaly. Unchanged mediastinal contours. No pulmonary edema, acute airspace disease, large pleural effusion or pneumothorax. Degenerative change of both shoulders. No acute osseous abnormalities are seen. IMPRESSION: Low lung volumes without acute abnormality. Electronically Signed   By: Keith Rake M.D.   On: 03/17/2021 18:58     Medications   Scheduled Meds: . enoxaparin (LOVENOX) injection  0.5 mg/kg Subcutaneous Q24H  . insulin aspart  0-15 Units Subcutaneous TID WC  .  insulin aspart  0-5 Units Subcutaneous QHS  . risperiDONE  0.25 mg Oral BID  . sertraline  50 mg Oral Daily   Continuous Infusions: . cefTRIAXone (ROCEPHIN)  IV         LOS: 1 day    Time spent: 30 minutes    Ezekiel Slocumb, DO [facility]  03/18/2021, 4:39 PM      If 7PM-7AM, please contact night-coverage. How to contact the [facility] Attending or Consulting provider [place] or covering provider during after hours [place], for this patient?    1. Check the care team in [facility] and look for a) attending/consulting [facility] provider listed and b) the [employer]  team listed 2. Log into [email or web address] and use [facility]'s universal password to access. If you do not have the password, please contact the hospital operator. 3. Locate the [facility] provider you are looking for under [facility] and page to a number that you can be directly reached. 4. If you still have difficulty reaching the provider, please page the [facility] (Director on Call) for the Hospitalists listed on amion for assistance.

## 2021-03-18 NOTE — Evaluation (Addendum)
Occupational Therapy Evaluation Patient Details Name: Norman Palmer MRN: [id number] DOB: 1941/09/22 Today's Date: 03/18/2021    History of Present Illness Pt is a 80 yo male admitted to ED due to suspect UTI, at home has been experiencing increased weakness, falls, and requiring more assist as well as increased confusion. Previously able to ambulate. PMH of dementia, DM, thyroid disease.   Clinical Impression   Norman Palmer presents to OT with impaired cognition, decreased safety awareness, generalized weakness, and impaired balance with recent falls that impacts his ability to safely and independently engage in functional tasks.  Prior to a couple weeks ago, pt was able to complete basic ADLs independently (no AD), although did receive assistance from his wife for lower body dressing.  Pt's wife provided assistance for IADLs and home maintenance tasks.  Recently, pt has had generalized weakness and more confusion at home, causing him to need more assistance for ADLs.  Currently, pt requires intermittent min assist for seated ADLs for safety and sequencing.  Pt declined OOB activity evaluation due to fatigue (recently finished PT session), but required mod-max assist for bed mobility and transfers per PT report.  Pt with decreased safety awareness and orientation of recent events (pt's wife provided assistance to answer questions re: home living).  Norman Palmer will likely continue to benefit from skilled OT services in acute setting to address functional strengthening, cognition, balance, and safety and independence in ADLs.  SNF is most appropriate discharge recommendation at this time.    Follow Up Recommendations  SNF    Equipment Recommendations  Other (comment) (defer to post acute setting)    Recommendations for Other Services       Precautions / Restrictions Precautions Precautions: Fall Restrictions Weight Bearing Restrictions: No      Mobility Bed Mobility Overal bed  mobility: Needs Assistance Bed Mobility: Rolling;Supine to Sit;Sit to Supine Rolling: +2 for physical assistance;Max assist   Supine to sit: Max assist;+2 for physical assistance Sit to supine: Max assist;+2 for physical assistance   General bed mobility comments: requires +2 assist per PT report    Transfers Overall transfer level: Needs assistance Equipment used: Rolling walker (2 wheeled) Transfers: Sit to/from Stand Sit to Stand: Mod assist         General transfer comment: requires mod-max A per PT report    Balance Overall balance assessment: Needs assistance   Sitting balance-Leahy Scale: Fair Sitting balance - Comments: able to sit with supervision once assisted to midline position     Standing balance-Leahy Scale: Poor Standing balance comment: reliant on RW and assist for PT to maintain standing                           ADL either performed or assessed with clinical judgement   ADL Overall ADL's : Needs assistance/impaired Eating/Feeding: Set up;Sitting   Grooming: Set up;Sitting   Upper Body Bathing: Minimal assistance;Sitting   Lower Body Bathing: Moderate assistance;Sitting/lateral leans   Upper Body Dressing : Set up;Sitting   Lower Body Dressing: Moderate assistance;Sitting/lateral leans     Toilet Transfer Details (indicate cue type and reason): not tested, requires mod-max assist +2 per PT report           General ADL Comments: Pt able to complete UB ADLs in seated position with intermittent min assist, requires moderate assist for LB dressing and bathing.  Functional mobility not tested this date due to pt fatigue  Vision Baseline Vision/History: No visual deficits Patient Visual Report: No change from baseline       Perception     Praxis      Pertinent Vitals/Pain Pain Assessment: No/denies pain Faces Pain Scale: Hurts a little bit Pain Location: with pericare Pain Descriptors / Indicators: Grimacing Pain  Intervention(s): Monitored during session;Limited activity within patient's tolerance     Hand Dominance Right   Extremity/Trunk Assessment Upper Extremity Assessment Upper Extremity Assessment: Generalized weakness (B shoulder flexion to limited ~80 degrees)   Lower Extremity Assessment Lower Extremity Assessment: Generalized weakness   Cervical / Trunk Assessment Cervical / Trunk Assessment: Normal   Communication Communication Communication: No difficulties   Cognition Arousal/Alertness: Awake/alert Behavior During Therapy: WFL for tasks assessed/performed (slightly irritable but overall cooperative) Overall Cognitive Status: Impaired/Different from baseline Area of Impairment: Orientation;Following commands;Safety/judgement;Awareness;Problem solving                 Orientation Level: Disoriented to;Place;Situation     Following Commands: Follows one step commands consistently Safety/Judgement: Decreased awareness of safety;Decreased awareness of deficits   Problem Solving: Slow processing General Comments: family reported pt cognitive status still not at baseline, decreased awareness of need for assistance.  Pt's wife often correcting pt when answering questions re: home setup and PLOF.   General Comments       Exercises Other Exercises Other Exercises: provided education re: OT role and plan of care, fall and safety precautions, self care, DME and discharge recommendations   Shoulder Instructions      Home Living Family/patient expects to be discharged to:: Private residence Living Arrangements: Spouse/significant other Available Help at Discharge: Family;Available 24 hours/day Type of Home: Mobile home Home Access: Stairs to enter Entrance Stairs-Number of Steps: 1 Entrance Stairs-Rails: None Home Layout: One level;Laundry or work area in basement;Able to live on main level with bedroom/bathroom     Bathroom Shower/Tub: Walk-in shower (with elevated lip  to step over)   Biochemist, clinical: Standard     Home Equipment: Wheelchair - [employer] - 2 wheels;Tub bench;Cane - single point;Grab bars - tub/shower;Grab bars - toilet          Prior Functioning/Environment Level of Independence: Needs assistance  Gait / Transfers Assistance Needed: Pt was independent in ambulation without AE prior to 2-3 weeks ago.  Recently pt has had increased weakness and multiple falls. ADL's / Homemaking Assistance Needed: Pt's wife provides assistance for LB dressing at baseline, as well as assistance for household maintenance and medication management.  Pt has had increased need for ADL assistance in the past few days.            OT Problem List: Decreased strength;Decreased range of motion;Decreased activity tolerance;Impaired balance (sitting and/or standing);Decreased cognition;Decreased safety awareness;Decreased knowledge of use of DME or AE;Decreased knowledge of precautions      OT Treatment/Interventions: Self-care/ADL training;Therapeutic exercise;Energy conservation;DME and/or AE instruction;Therapeutic activities;Cognitive remediation/compensation;Patient/family education;Balance training    OT Goals(Current goals can be found in the care plan section) Acute Rehab OT Goals Patient Stated Goal: to go home OT Goal Formulation: With patient/family Time For Goal Achievement: 04/01/21 Potential to Achieve Goals: Fair ADL Goals Pt Will Transfer to Toilet: with min guard assist;ambulating (with LRAD) Additional ADL Goal #1: Pt will verbalize x3 fall prevention strategies with mod assist for improved safety and independence in self care tasks. Additional ADL Goal #2: Pt will verbalize x3 compensatory strategies for functional cognition with mod assist for improved safety and independence in self care tasks.  OT Frequency: Min 2X/week   Barriers to D/C: Decreased caregiver support  pt's wife at risk for injury if providing high level of physical  assist       Co-evaluation              AM-PAC OT "6 Clicks" Daily Activity     Outcome Measure Help from another person eating meals?: None Help from another person taking care of personal grooming?: A Little Help from another person toileting, which includes using toliet, bedpan, or urinal?: A Lot Help from another person bathing (including washing, rinsing, drying)?: A Lot Help from another person to put on and taking off regular upper body clothing?: A Little Help from another person to put on and taking off regular lower body clothing?: A Lot 6 Click Score: 16   End of Session    Activity Tolerance: Patient limited by fatigue (recently finished PT session) Patient left: in bed;with call bell/phone within reach;with bed alarm set;with family/visitor present  OT Visit Diagnosis: Other abnormalities of gait and mobility (R26.89);Repeated falls (R29.6);Muscle weakness (generalized) (M62.81);Other symptoms and signs involving cognitive function                Time: 4982-6415 OT Time Calculation (min): 11 min Charges:  OT General Charges $OT Visit: 1 Visit OT Evaluation $OT Eval Moderate Complexity: 1 Mod  Myrtie Hawk Yajahira Tison, OTR/L 03/18/21, 4:37 PM

## 2021-03-19 DIAGNOSIS — E119 Type 2 diabetes mellitus without complications: Secondary | ICD-10-CM | POA: Diagnosis not present

## 2021-03-19 DIAGNOSIS — N390 Urinary tract infection, site not specified: Secondary | ICD-10-CM | POA: Diagnosis not present

## 2021-03-19 DIAGNOSIS — N401 Enlarged prostate with lower urinary tract symptoms: Secondary | ICD-10-CM | POA: Diagnosis not present

## 2021-03-19 DIAGNOSIS — N138 Other obstructive and reflux uropathy: Secondary | ICD-10-CM | POA: Diagnosis not present

## 2021-03-19 LAB — BASIC METABOLIC PANEL
Anion gap: 7 (ref 5–15)
BUN: 39 mg/dL — ABNORMAL HIGH (ref 8–23)
CO2: 23 mmol/L (ref 22–32)
Calcium: 8.8 mg/dL — ABNORMAL LOW (ref 8.9–10.3)
Chloride: 107 mmol/L (ref 98–111)
Creatinine, Ser: 1.46 mg/dL — ABNORMAL HIGH (ref 0.61–1.24)
GFR, Estimated: 49 mL/min — ABNORMAL LOW (ref >60)
Glucose, Bld: 172 mg/dL — ABNORMAL HIGH (ref 70–99)
Potassium: 4.5 mmol/L (ref 3.5–5.1)
Sodium: 137 mmol/L (ref 135–145)

## 2021-03-19 LAB — CBC
HCT: 32.7 % — ABNORMAL LOW (ref 39.0–52.0)
Hemoglobin: 10.6 g/dL — ABNORMAL LOW (ref 13.0–17.0)
MCH: 30.4 pg (ref 26.0–34.0)
MCHC: 32.4 g/dL (ref 30.0–36.0)
MCV: 93.7 fL (ref 80.0–100.0)
Platelets: 275 10*3/uL (ref 150–400)
RBC: 3.49 MIL/uL — ABNORMAL LOW (ref 4.22–5.81)
RDW: 12.3 % (ref 11.5–15.5)
WBC: 11.9 10*3/uL — ABNORMAL HIGH (ref 4.0–10.5)
nRBC: 0 % (ref 0.0–0.2)

## 2021-03-19 LAB — GLUCOSE, CAPILLARY
Glucose-Capillary: 182 mg/dL — ABNORMAL HIGH (ref 70–99)
Glucose-Capillary: 176 mg/dL — ABNORMAL HIGH (ref 70–99)
Glucose-Capillary: 147 mg/dL — ABNORMAL HIGH (ref 70–99)
Glucose-Capillary: 198 mg/dL — ABNORMAL HIGH (ref 70–99)

## 2021-03-19 LAB — MAGNESIUM: Magnesium: 2.1 mg/dL (ref 1.7–2.4)
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
## 2021-03-19 NOTE — Progress Notes (Addendum)
PROGRESS NOTE    DONIELLE RADZIEWICZ   UEA:[id number]  DOB: 07/19/1941  PCP: Birdie Sons, MD    DOA: 03/17/2021 LOS: 2   Brief Narrative    Norman Palmer is a 80 y.o. 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  Pt lives with wife, has baseline dementia with worsening confusion, agitation and aggressiveness.  At baseline, pt requires minimal assistance but wife says unable to stand or walk prior to presenting.  Also with poor appetite or decreased PO intake.  Has hx of UTI's.  Found to have UTI on evaluation in the ED, likely the etiology of his altered mentation and weakness.    Admitted to hospital and started on empiric Rocephin pending cultures.  PT and OT evaluated patient and are recommending SNF for rehab on discharge.    Assessment & Plan   Principal Problem:   AMS (altered mental status) Active Problems:   BPH with obstruction/lower urinary tract symptoms   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[place])   Essential hypertension   Hyperlipemia   Obstructive sleep apnea   Acute lower UTI   UTI -present on admission. --Continue empiric Rocephin --Follow urine and blood cultures  Acute infectious encephalopathy superimposed on baseline dementia -likely due to UTI.  Treat underlying infection as above.  Patient received Tylenol in the ER. Head CT was negative for anything acute. --Continued on home Aricept, Risperdal and Zoloft. --If agitation worsens, would use Haldol --Avoid benzos and other sedating meds ----Delirium precautions:     -Lights and TV off, minimize interruptions at night    -Blinds open and lights on during day    -Glasses/hearing aid with patient    -Frequent reorientation    -PT/OT when able  AKI vs CKD stage 3a? - baseline renal function unclear, in chart  review Cr 1.6 on 3/10, previously 1.5 appears baseline. Cr improving 1.76 >>1.46  Type 2 diabetes -appears controlled.  Home regimen Metformin and Humulin NPH insulin 30 units each morning --Cover with sliding scale NovoLog --May resume basal tomorrow if indicated, CBG's controlled  Essential hypertension -continued on home lisinopril 20 mg. Hold HCTZ-triamterene for now. PRN hydralazine  Hyperlipidemia -continue home statin  Obstructive sleep apnea -CPAP ordered  BPH -continue home Flomax and Avodart  Obesity: Body mass index is 38.79 kg/m.  Complicates overall care and prognosis.  Recommend lifestyle modifications including physical activity and diet for weight loss and overall long-term health.   DVT prophylaxis:    Diet:  Diet Orders (From admission, onward)    Start     Ordered   03/17/21 2234  Diet heart healthy/carb modified Room service appropriate? Yes; Fluid consistency: Thin  Diet effective now       Question Answer Comment  Diet-HS Snack? Nothing   Room service appropriate? Yes   Fluid consistency: Thin      03/17/21 2233            Code Status: Full Code    Subjective 03/19/21    Patient seen with wife at bedside today. Nursing staff report some agitation overnight.  Pt thinks he is able to walk, does not recall that he is extremely week.  He denies pain, nausea, SOB, CP or other complaints.     Disposition Plan & Communication   Status is: Inpatient  Inpatient status appropriate due  to severity of illness requiring IV therapies as above.  On empiric antibiotics pending cultures.  Dispo: The patient is from: Home              Anticipated d/c is to: SNF              Patient currently not medically stable for discharge   Difficult to place patient no   Family Communication: wife at bedside on rounds today 3/27   Consults, Procedures, Significant Events   Consultants:   none  Procedures:   none  Antimicrobials:  Anti-infectives (From  admission, onward)   Start     Dose/Rate Route Frequency Ordered Stop   03/18/21 2100  cefTRIAXone (ROCEPHIN) 1 g in sodium chloride 0.9 % 100 mL IVPB        1 g 200 mL/hr over 30 Minutes Intravenous Every 24 hours 03/17/21 2233     03/17/21 2045  cefTRIAXone (ROCEPHIN) 2 g in sodium chloride 0.9 % 100 mL IVPB        2 g 200 mL/hr over 30 Minutes Intravenous  Once 03/17/21 2043 03/17/21 2153        Micro    Objective   Vitals:   03/19/21 0800 03/19/21 1147 03/19/21 1200 03/19/21 1515  BP: (!) 141/56 (!) 141/60  (!) 142/69  Pulse: 78 74  80  Resp: 18     Temp: 98.3 F (36.8 C)  97.9 F (36.6 C) 98.3 F (36.8 C)  TempSrc: Oral   Oral  SpO2: 95% 95%  91%  Weight:      Height:        Intake/Output Summary (Last 24 hours) at 03/19/2021 1616 Last data filed at 03/19/2021 1024 Gross per 24 hour  Intake 578.64 ml  Output 1300 ml  Net -721.36 ml   Filed Weights   03/17/21 1807  Weight: 129.7 kg    Physical Exam:  General exam: awake but appears drowsy, no acute distress, obese, confused Respiratory system: CTAB, normal respiratory effort, on room air. Cardiovascular system: normal S1/S2, RRR, 1+ lower extremity edema.   Gastrointestinal system: protuberant abdomen, nontender, +bowel sounds. Central nervous system: no gross focal neurologic deficits, normal speech Psychiatry: confused, normal mood, abnormal judgement and insight  Labs   Data Reviewed: I have personally reviewed following labs and imaging studies  CBC: Recent Labs  Lab 03/17/21 1806 03/18/21 0504 03/19/21 0420  WBC 15.5* 11.6* 11.9*  NEUTROABS 11.6*  --   --   HGB 10.9* 10.2* 10.6*  HCT 34.9* 31.4* 32.7*  MCV 95.6 94.6 93.7  PLT 292 260 382   Basic Metabolic Panel: Recent Labs  Lab 03/17/21 1806 03/18/21 0504 03/19/21 0420  NA 138 136 137  K 4.3 4.5 4.5  CL 106 106 107  CO2 21* 22 23  GLUCOSE 145* 140* 172*  BUN 58* 52* 39*  CREATININE 1.79* 1.76* 1.46*  CALCIUM 9.1 8.7* 8.8*  MG   --   --  2.1   GFR: Estimated Creatinine Clearance: 59 mL/min (A) (by C-G formula based on SCr of 1.46 mg/dL (H)). Liver Function Tests: Recent Labs  Lab 03/17/21 1806 03/18/21 0504  AST 24 18  ALT 16 13  ALKPHOS 53 46  BILITOT 0.6 0.5  PROT 7.1 6.3*  ALBUMIN 4.0 3.4*   No results for input(s): LIPASE, AMYLASE in the last 168 hours. No results for input(s): AMMONIA in the last 168 hours. Coagulation Profile: No results for input(s): INR, PROTIME in the last 168 hours.  Cardiac Enzymes: No results for input(s): CKTOTAL, CKMB, CKMBINDEX, TROPONINI in the last 168 hours. BNP (last 3 results) No results for input(s): PROBNP in the last 8760 hours. HbA1C: Recent Labs    03/17/21 1806  HGBA1C 6.8*   CBG: Recent Labs  Lab 03/18/21 1612 03/18/21 2133 03/19/21 0727 03/19/21 1147 03/19/21 1610  GLUCAP 138* 140* 182* 176* 147*   Lipid Profile: No results for input(s): CHOL, HDL, LDLCALC, TRIG, CHOLHDL, LDLDIRECT in the last 72 hours. Thyroid Function Tests: No results for input(s): TSH, T4TOTAL, FREET4, T3FREE, THYROIDAB in the last 72 hours. Anemia Panel: No results for input(s): VITAMINB12, FOLATE, FERRITIN, TIBC, IRON, RETICCTPCT in the last 72 hours. Sepsis Labs: No results for input(s): PROCALCITON, LATICACIDVEN in the last 168 hours.  Recent Results (from the past 240 hour(s))  Urine culture     Status: Abnormal (Preliminary result)   Collection Time: 03/17/21  7:42 PM   Specimen: Urine, Random  Result Value Ref Range Status   Specimen Description   Final    URINE, RANDOM Performed at [facility], [address] [place], [place] [address]    Special Requests   Final    NONE Performed at [facility], [address] [place], [place] [address]    Culture (A)  Final    >=100,000 COLONIES/mL ESCHERICHIA COLI CULTURE REINCUBATED FOR BETTER GROWTH SUSCEPTIBILITIES TO FOLLOW Performed at [facility], [place] [address], [place],  [place] [address]    Report Status PENDING  Incomplete  Resp Panel by RT-PCR (Flu A&B, Covid) Nasopharyngeal Swab     Status: None   Collection Time: 03/17/21  8:49 PM   Specimen: Nasopharyngeal Swab; Nasopharyngeal(NP) swabs in vial transport medium  Result Value Ref Range Status   SARS Coronavirus 2 by RT PCR NEGATIVE NEGATIVE Final    Comment: (NOTE) SARS-CoV-2 target nucleic acids are NOT DETECTED.  The SARS-CoV-2 RNA is generally detectable in upper respiratory specimens during the acute phase of infection. The lowest concentration of SARS-CoV-2 viral copies this assay can detect is 138 copies/mL. A negative result does not preclude SARS-Cov-2 infection and should not be used as the sole basis for treatment or other patient management decisions. A negative result may occur with  improper specimen collection/handling, submission of specimen other than nasopharyngeal swab, presence of viral mutation(s) within the areas targeted by this assay, and inadequate number of viral copies(<138 copies/mL). A negative result must be combined with clinical observations, patient history, and epidemiological information. The expected result is Negative.  Fact Sheet for Patients:  EntrepreneurPulse.com.au  Fact Sheet for Healthcare Providers:  IncredibleEmployment.be  This test is no t yet approved or cleared by the [place] FDA and  has been authorized for detection and/or diagnosis of SARS-CoV-2 by FDA under an Emergency Use Authorization (EUA). This EUA will remain  in effect (meaning this test can be used) for the duration of the COVID-19 declaration under Section 564(b)(1) of the Act, 21 U.S.C.section 360bbb-3(b)(1), unless the authorization is terminated  or revoked sooner.       Influenza A by PCR NEGATIVE NEGATIVE Final   Influenza B by PCR NEGATIVE NEGATIVE Final    Comment: (NOTE) The Xpert Xpress SARS-CoV-2/FLU/RSV plus assay is intended as an  aid in the diagnosis of influenza from Nasopharyngeal swab specimens and should not be used as a sole basis for treatment. Nasal washings and aspirates are unacceptable for Xpert Xpress SARS-CoV-2/FLU/RSV testing.  Fact Sheet for Patients: EntrepreneurPulse.com.au  Fact Sheet for  Healthcare Providers: IncredibleEmployment.be  This test is not yet approved or cleared by the [place] and has been authorized for detection and/or diagnosis of SARS-CoV-2 by FDA under an Emergency Use Authorization (EUA). This EUA will remain in effect (meaning this test can be used) for the duration of the COVID-19 declaration under Section 564(b)(1) of the Act, 21 U.S.C. section 360bbb-3(b)(1), unless the authorization is terminated or revoked.  Performed at [facility], [place], [place] [address]       Imaging Studies   CT Head Wo Contrast  Result Date: 03/17/2021 CLINICAL DATA:  Delirium, multiple falls EXAM: CT HEAD WITHOUT CONTRAST TECHNIQUE: Contiguous axial images were obtained from the base of the skull through the vertex without intravenous contrast. COMPARISON:  None. FINDINGS: Brain: No evidence of acute infarction, hemorrhage, hydrocephalus, extra-axial collection or mass lesion/mass effect. Mild-moderate low-density changes within the periventricular and subcortical white matter compatible with chronic microvascular ischemic change. Mild-moderate diffuse cerebral volume loss. Vascular: Atherosclerotic calcifications involving the large vessels of the skull base. No unexpected hyperdense vessel. Skull: Normal. Negative for fracture or focal lesion. Sinuses/Orbits: No acute finding. Other: Ill-defined scalp hematoma in the posterior right high parietal region. IMPRESSION: 1. No acute intracranial findings. 2. Ill-defined scalp hematoma in the posterior right high parietal region. No underlying calvarial fracture. 3.  Mild-moderate chronic microvascular ischemic change and cerebral volume loss. Electronically Signed   By: Davina Poke D.O.   On: 03/17/2021 20:06   CT Lumbar Spine Wo Contrast  Result Date: 03/17/2021 CLINICAL DATA:  Low back pain with multiple falls EXAM: CT LUMBAR SPINE WITHOUT CONTRAST TECHNIQUE: Multidetector CT imaging of the lumbar spine was performed without intravenous contrast administration. Multiplanar CT image reconstructions were also generated. COMPARISON:  None. FINDINGS: Segmentation: Standard Alignment: Grade 1 anterolisthesis at L4-5 Vertebrae: No acute fracture or focal pathologic process. Paraspinal and other soft tissues: Calcific aortic atherosclerosis. Disc levels: L1-2: Mild facet hypertrophy.  No spinal canal stenosis. L2-3: Moderate facet hypertrophy.  Mild spinal canal stenosis. L3-4: Severe facet hypertrophy and small disc bulge. Moderate spinal canal stenosis. Moderate bilateral foraminal stenosis. L4-5: Severe facet hypertrophy with small disc bulge. Mild spinal canal stenosis. Mild right and moderate left foraminal stenosis. L5-S1: Severe facet hypertrophy with small disc bulge. No spinal canal stenosis. Moderate left foraminal stenosis. IMPRESSION: 1. No acute fracture of the lumbar spine. 2. Grade 1 L4-5 anterolisthesis due to severe facet arthrosis, which may be a source of local low back pain. 3. 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 4. Moderate multilevel neural foraminal stenosis. Aortic Atherosclerosis (ICD10-I70.0). Electronically Signed   By: Ulyses Jarred M.D.   On: 03/17/2021 20:27   DG Chest Portable 1 View  Result Date: 03/17/2021 CLINICAL DATA:  Weakness and shortness of breath.  Multiple falls. EXAM: PORTABLE CHEST 1 VIEW COMPARISON:  Most recent radiograph 11/26/2019 FINDINGS: Lung volumes are low. Stable borderline cardiomegaly. Unchanged mediastinal contours. No pulmonary edema, acute airspace  disease, large pleural effusion or pneumothorax. Degenerative change of both shoulders. No acute osseous abnormalities are seen. IMPRESSION: Low lung volumes without acute abnormality. Electronically Signed   By: Keith Rake M.D.   On: 03/17/2021 18:58     Medications   Scheduled Meds: . donepezil  10 mg Oral QHS  . dutasteride  0.5 mg Oral Daily  . enoxaparin (LOVENOX) injection  0.5 mg/kg Subcutaneous Q24H  . insulin aspart  0-15 Units Subcutaneous TID  WC  . insulin aspart  0-5 Units Subcutaneous QHS  . lisinopril  20 mg Oral Daily  . risperiDONE  0.25 mg Oral BID  . sertraline  50 mg Oral Daily  . simvastatin  40 mg Oral QPM  . tamsulosin  0.4 mg Oral Daily   Continuous Infusions: . cefTRIAXone (ROCEPHIN)  IV Stopped (03/18/21 2257)       LOS: 2 days    Time spent: 25 minutes     Ezekiel Slocumb, DO [facility]  03/19/2021, 4:16 PM      If 7PM-7AM, please contact night-coverage. How to contact the [facility] Attending or Consulting provider [place] or covering provider during after hours [place], for this patient?    1. Check the care team in [facility] and look for a) attending/consulting [facility] provider listed and b) the [facility] team listed 2. Log into [email or web address] and use [place]'s universal password to access. If you do not have the password, please contact the hospital operator. 3. Locate the [facility] provider you are looking for under [facility] and page to a number that you can be directly reached. 4. If you still have difficulty reaching the provider, please page the [facility] (Director on Call) for the Hospitalists listed on amion for assistance.

## 2021-03-19 NOTE — Progress Notes (Signed)
Physical Therapy Treatment Patient Details Name: Norman Palmer MRN: [id number] DOB: 1941/07/18 Today's Date: 03/19/2021    History of Present Illness Pt is a 80 yo male admitted to ED due to suspect UTI, at home has been experiencing increased weakness, falls, and requiring more assist as well as increased confusion. Previously able to ambulate. PMH of dementia, DM, thyroid disease.    PT Comments    Pt was long sitting in bed upon arriving. Supportive spouse and son at bedside. Pt agrees to PT session with encouragement. Needs constant relaxation techniques and encouragement to maximize session progression. He required max assist of 1 with second person close for safety. He stood EOB (slightly elevated) to RW 3x. 2 x ~ 15-20 sec. And 3rd x was able to stand ~ 30 seconds while side stepping from FOB to [facility]. Anxiety most limiting this session. At conclusion of session, pt was repositioned in bed with HOB elevated, and RN in room. Acute PT will continue to progress as able per POC.    Follow Up Recommendations  SNF     Equipment Recommendations  None recommended by PT       Precautions / Restrictions Precautions Precautions: Fall Restrictions Weight Bearing Restrictions: No    Mobility  Bed Mobility Overal bed mobility: Needs Assistance Bed Mobility: Supine to Sit;Sit to Supine     Supine to sit: Max assist;HOB elevated;+2 for safety/equipment Sit to supine: Mod assist;HOB elevated   General bed mobility comments: pt was able to achieve EOB short sit from supine with max assist of one. son present to assist if needed. After OOB activity, pt required mod assist to return to supine with HOB elevated.    Transfers Overall transfer level: Needs assistance Equipment used: Rolling walker (2 wheeled) Transfers: Sit to/from Stand Sit to Stand: +2 safety/equipment;Mod assist;From elevated surface;Max assist         General transfer comment: Pt was able to stand EOB 3 x total.  anxiety most limiting.  Ambulation/Gait Ambulation/Gait assistance: Min assist Gait Distance (Feet): 3 Feet Assistive device: Rolling walker (2 wheeled) Gait Pattern/deviations: Trunk flexed;Step-to pattern Gait velocity: decreased   General Gait Details: pt was able to take ~ 4-5 steps from FOB to [facility]. INcreased time and vcs for technique. pt's anxiety most limiting      Balance Overall balance assessment: Needs assistance   Sitting balance-Leahy Scale: Fair Sitting balance - Comments: able to sit with supervision once assisted to midline position   Standing balance support: Bilateral upper extremity supported;During functional activity Standing balance-Leahy Scale: Poor Standing balance comment: reliant on RW and assist for PT to maintain standing           Cognition Arousal/Alertness: Awake/alert Behavior During Therapy: Anxious (anxious with mobility) Overall Cognitive Status: Impaired/Different from baseline Area of Impairment: Orientation;Following commands;Safety/judgement;Awareness;Problem solving      Orientation Level: Disoriented to;Place;Situation     Following Commands: Follows one step commands consistently Safety/Judgement: Decreased awareness of safety;Decreased awareness of deficits   Problem Solving: Slow processing General Comments: family reported pt cognitive status still not at baseline, decreased awareness of need for assistance.  Pt's wife/son often correcting pt when answering questions.             Pertinent Vitals/Pain Pain Assessment: No/denies pain Faces Pain Scale: No hurt Pain Intervention(s): Limited activity within patient's tolerance;Monitored during session;Repositioned           PT Goals (current goals can now be found in the care plan section) Acute  Rehab PT Goals Patient Stated Goal: to go home Progress towards PT goals: Not progressing toward goals - comment (anxiety limiting)    Frequency    Min 2X/week       PT Plan Current plan remains appropriate       AM-PAC PT "6 Clicks" Mobility   Outcome Measure  Help needed turning from your back to your side while in a flat bed without using bedrails?: A Lot Help needed moving from lying on your back to sitting on the side of a flat bed without using bedrails?: A Lot Help needed moving to and from a bed to a chair (including a wheelchair)?: Total Help needed standing up from a chair using your arms (e.g., wheelchair or bedside chair)?: Total Help needed to walk in hospital room?: Total Help needed climbing 3-5 steps with a railing? : Total 6 Click Score: 8    End of Session Equipment Utilized During Treatment: Gait belt Activity Tolerance: Patient limited by fatigue Patient left: in bed;with call bell/phone within reach;with family/visitor present;with bed alarm set Nurse Communication: Mobility status PT Visit Diagnosis: Other abnormalities of gait and mobility (R26.89);Muscle weakness (generalized) (M62.81);Difficulty in walking, not elsewhere classified (R26.2)     Time: 1540-1600 PT Time Calculation (min) (ACUTE ONLY): 20 min  Charges:  $Therapeutic Activity: 8-22 mins                     Julaine Fusi PTA 03/19/21, 4:10 PM
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
## 2021-03-20 DIAGNOSIS — R338 Other retention of urine: Secondary | ICD-10-CM | POA: Diagnosis not present

## 2021-03-20 DIAGNOSIS — N35812 Other urethral bulbous stricture, male: Secondary | ICD-10-CM

## 2021-03-20 DIAGNOSIS — N390 Urinary tract infection, site not specified: Secondary | ICD-10-CM | POA: Diagnosis not present

## 2021-03-20 DIAGNOSIS — E119 Type 2 diabetes mellitus without complications: Secondary | ICD-10-CM | POA: Diagnosis not present

## 2021-03-20 DIAGNOSIS — E785 Hyperlipidemia, unspecified: Secondary | ICD-10-CM

## 2021-03-20 DIAGNOSIS — N401 Enlarged prostate with lower urinary tract symptoms: Secondary | ICD-10-CM | POA: Diagnosis not present

## 2021-03-20 LAB — GLUCOSE, CAPILLARY
Glucose-Capillary: 189 mg/dL — ABNORMAL HIGH (ref 70–99)
Glucose-Capillary: 187 mg/dL — ABNORMAL HIGH (ref 70–99)
Glucose-Capillary: 247 mg/dL — ABNORMAL HIGH (ref 70–99)
Glucose-Capillary: 154 mg/dL — ABNORMAL HIGH (ref 70–99)
Glucose-Capillary: 179 mg/dL — ABNORMAL HIGH (ref 70–99)
Glucose-Capillary: 224 mg/dL — ABNORMAL HIGH (ref 70–99)

## 2021-03-20 LAB — URINE CULTURE: Culture: >=100000 — AB

## 2021-03-20 LAB — BASIC METABOLIC PANEL
Anion gap: 9 (ref 5–15)
BUN: 39 mg/dL — ABNORMAL HIGH (ref 8–23)
CO2: 22 mmol/L (ref 22–32)
Calcium: 8.9 mg/dL (ref 8.9–10.3)
Chloride: 105 mmol/L (ref 98–111)
Creatinine, Ser: 1.45 mg/dL — ABNORMAL HIGH (ref 0.61–1.24)
GFR, Estimated: 50 mL/min — ABNORMAL LOW (ref >60)
Glucose, Bld: 211 mg/dL — ABNORMAL HIGH (ref 70–99)
Potassium: 4.6 mmol/L (ref 3.5–5.1)
Sodium: 136 mmol/L (ref 135–145)

## 2021-03-20 MED ORDER — TRIAMTERENE-HCTZ 37.5-25 MG PO TABS
1.0000 | ORAL_TABLET | Freq: Every day | ORAL | Status: DC
  Administered 2021-03-20 – 2021-03-23 (×4): 1 via ORAL
  Filled 2021-03-20 (×4): qty 1

## 2021-03-20 MED ORDER — CHLORHEXIDINE GLUCONATE CLOTH 2 % EX PADS
6.0000 | MEDICATED_PAD | Freq: Every day | CUTANEOUS | Status: DC
  Administered 2021-03-21 – 2021-03-26 (×7): 6 via TOPICAL

## 2021-03-20 MED ORDER — INSULIN DETEMIR 100 UNIT/ML ~~LOC~~ SOLN
20.0000 [IU] | Freq: Every day | SUBCUTANEOUS | Status: DC
  Administered 2021-03-20 – 2021-03-22 (×3): 20 [IU] via SUBCUTANEOUS
  Filled 2021-03-20 (×4): qty 0.2

## 2021-03-20 MED ORDER — INSULIN NPH (HUMAN) (ISOPHANE) 100 UNIT/ML ~~LOC~~ SUSP
20.0000 [IU] | Freq: Every day | SUBCUTANEOUS | Status: DC
  Filled 2021-03-20: qty 10

## 2021-03-20 NOTE — Progress Notes (Signed)
PROGRESS NOTE    Norman Palmer  KDX:[id number] DOB: February 20, 1941 DOA: 03/17/2021 PCP: Birdie Sons, MD   Brief Narrative: Norman Palmer is a 80 y.o. male with history of dementia, diabetes, hypothyroidism, basal cell carcinoma of the skin.  Patient resented secondary to agitation and weakness and was found to have an acute UTI.  Patient was started on empiric antibiotics.  Hospitalization complicated by delirium in setting of underlying dementia in addition to acute urinary retention.    Assessment & Plan:   Principal Problem:   AMS (altered mental status) Active Problems:   BPH with obstruction/lower urinary tract symptoms   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[place])   Essential hypertension   Hyperlipemia   Obstructive sleep apnea   Acute lower UTI   UTI Setting of underlying BPH.  Patient empirically treated with ceftriaxone IV urine culture is significant for E. coli, sensitive to ceftriaxone. -Continue ceftriaxone IV with plan for 5 to 7-day treatment course depending on response to treatment  Acute metabolic encephalopathy Delirium Dementia Patient's mental status changes appear to be likely multifactorial in setting of UTI in addition to underlying dementia.  Patient appears to be on risperidone as an outpatient. -Delirium precautions -Continue home Aricept, Zoloft -Resume home risperidone 0.25 mg twice daily  Diabetes mellitus, type II Peripheral neuropathy Patient is on NPH 30 units every evening in addition to glipizide and Metformin as an outpatient.  Patient started on sliding scale insulin only while inpatient -Continue sliding scale insulin -Restart home NPH at 20 units nightly  Acute urinary retention Patient with a history of BPH and urethral stricture.  Complicated by UTI.  Bladder scan performed by nursing and was significant for a postvoid residual of 631 mL -Attempt In and out catheterization; if unable, will need urology consult -Watch  urine output carefully and in and out cath as needed.  If continues to have retention issues, will need placement of a urinary Foley catheter and outpatient urology follow-up which would not be ideal in setting of dementia  Hyperlipidemia -Continue simvastatin and dutasteride  BPH -Continue Flomax   DVT prophylaxis: Lovenox Code Status:   Code Status: Full Code Family Communication: Wife at bedside Disposition Plan: Likely medically stable for discharge in 24 hours pending improvement of urinary retention.   Consultants:   None  Procedures:   None  Antimicrobials:  Ceftriaxone IV   Subjective: Patient reports no issues overnight.  States he does not remember having issues with staff with regard to his vitals.  Patient feels that he is able to walk around and is not having any issues with functional mobility.  Wife at bedside is concerned about his functional ability as she witnessed that he was requiring max assist yesterday with physical therapy.  She feels that his mentation is improved but also mentions that it worsens during the nighttime with associated confusion.  Objective: Vitals:   03/19/21 2256 03/20/21 0927 03/20/21 0927 03/20/21 1211  BP: (!) 144/75 (!) 150/57 (!) 150/57 (!) 135/54  Pulse: 87  84 87  Resp: 16  18 18   Temp: 98.7 F (37.1 C)  98.5 F (36.9 C) 99 F (37.2 C)  TempSrc:      SpO2: 95%  96% 96%  Weight:      Height:        Intake/Output Summary (Last 24 hours) at 03/20/2021 1452 Last data filed at 03/20/2021 1437 Gross per 24 hour  Intake 240 ml  Output 1931 ml  Net -1691  ml   Filed Weights   03/17/21 1807  Weight: 129.7 kg    Examination:  General exam: Appears calm and comfortable and in no acute distress. Conversant Respiratory: Clear to auscultation. Respiratory effort normal with no intercostal retractions or use of accessory muscles Cardiovascular: S1 & S2 heard, RRR. No murmurs, rubs, gallops or clicks. No  edema Gastrointestinal: Abdomen is nondistended, soft and nontender. No masses felt. Normal bowel sounds heard Neurologic: No focal neurological deficits Musculoskeletal: No calf tenderness Skin: No cyanosis. No new rashes Psychiatry: Alert and oriented to person, place and month/year. Short term memory is impaired. Mood & affect appropriate   Data Reviewed: I have personally reviewed following labs and imaging studies  CBC Lab Results  Component Value Date   WBC 11.9 (H) 03/19/2021   RBC 3.49 (L) 03/19/2021   HGB 10.6 (L) 03/19/2021   HCT 32.7 (L) 03/19/2021   MCV 93.7 03/19/2021   MCH 30.4 03/19/2021   PLT 275 03/19/2021   MCHC 32.4 03/19/2021   RDW 12.3 03/19/2021   LYMPHSABS 2.2 03/17/2021   MONOABS 1.5 (H) 03/17/2021   EOSABS 0.1 03/17/2021   BASOSABS 0.1 61/60/7371     Last metabolic panel Lab Results  Component Value Date   NA 136 03/20/2021   K 4.6 03/20/2021   CL 105 03/20/2021   CO2 22 03/20/2021   BUN 39 (H) 03/20/2021   CREATININE 1.45 (H) 03/20/2021   GLUCOSE 211 (H) 03/20/2021   GFRNONAA 50 (L) 03/20/2021   GFRAA 43 (L) 11/26/2019   CALCIUM 8.9 03/20/2021   PROT 6.3 (L) 03/18/2021   ALBUMIN 3.4 (L) 03/18/2021   BILITOT 0.5 03/18/2021   ALKPHOS 46 03/18/2021   AST 18 03/18/2021   ALT 13 03/18/2021   ANIONGAP 9 03/20/2021    CBG (last 3)  Recent Labs    03/20/21 0748 03/20/21 1049 03/20/21 1211  GLUCAP 189* 187* 247*     GFR: Estimated Creatinine Clearance: 59.4 mL/min (A) (by C-G formula based on SCr of 1.45 mg/dL (H)).  Coagulation Profile: No results for input(s): INR, PROTIME in the last 168 hours.  Recent Results (from the past 240 hour(s))  Urine culture     Status: Abnormal   Collection Time: 03/17/21  7:42 PM   Specimen: Urine, Random  Result Value Ref Range Status   Specimen Description   Final    URINE, RANDOM Performed at [facility], [address] [place], [place] [address]    Special Requests   Final     NONE Performed at [facility], [address] [place], [place] [address]    Culture >=100,000 COLONIES/mL ESCHERICHIA COLI (A)  Final   Report Status 03/20/2021 FINAL  Final   Organism ID, Bacteria ESCHERICHIA COLI (A)  Final      Susceptibility   Escherichia coli - MIC*    AMPICILLIN >=32 RESISTANT Resistant     CEFAZOLIN >=64 RESISTANT Resistant     CEFEPIME <=0.12 SENSITIVE Sensitive     CEFTRIAXONE <=0.25 SENSITIVE Sensitive     CIPROFLOXACIN >=4 RESISTANT Resistant     GENTAMICIN <=1 SENSITIVE Sensitive     IMIPENEM <=0.25 SENSITIVE Sensitive     NITROFURANTOIN <=16 SENSITIVE Sensitive     TRIMETH/SULFA <=20 SENSITIVE Sensitive     AMPICILLIN/SULBACTAM >=32 RESISTANT Resistant     PIP/TAZO <=4 SENSITIVE Sensitive     * >=100,000 COLONIES/mL ESCHERICHIA COLI  Resp Panel by RT-PCR (Flu A&B, Covid) Nasopharyngeal Swab     Status: None  Collection Time: 03/17/21  8:49 PM   Specimen: Nasopharyngeal Swab; Nasopharyngeal(NP) swabs in vial transport medium  Result Value Ref Range Status   SARS Coronavirus 2 by RT PCR NEGATIVE NEGATIVE Final    Comment: (NOTE) SARS-CoV-2 target nucleic acids are NOT DETECTED.  The SARS-CoV-2 RNA is generally detectable in upper respiratory specimens during the acute phase of infection. The lowest concentration of SARS-CoV-2 viral copies this assay can detect is 138 copies/mL. A negative result does not preclude SARS-Cov-2 infection and should not be used as the sole basis for treatment or other patient management decisions. A negative result may occur with  improper specimen collection/handling, submission of specimen other than nasopharyngeal swab, presence of viral mutation(s) within the areas targeted by this assay, and inadequate number of viral copies(<138 copies/mL). A negative result must be combined with clinical observations, patient history, and epidemiological information. The expected result is Negative.  Fact Sheet for  Patients:  EntrepreneurPulse.com.au  Fact Sheet for Healthcare Providers:  IncredibleEmployment.be  This test is no t yet approved or cleared by the [place] FDA and  has been authorized for detection and/or diagnosis of SARS-CoV-2 by FDA under an Emergency Use Authorization (EUA). This EUA will remain  in effect (meaning this test can be used) for the duration of the COVID-19 declaration under Section 564(b)(1) of the Act, 21 U.S.C.section 360bbb-3(b)(1), unless the authorization is terminated  or revoked sooner.       Influenza A by PCR NEGATIVE NEGATIVE Final   Influenza B by PCR NEGATIVE NEGATIVE Final    Comment: (NOTE) The Xpert Xpress SARS-CoV-2/FLU/RSV plus assay is intended as an aid in the diagnosis of influenza from Nasopharyngeal swab specimens and should not be used as a sole basis for treatment. Nasal washings and aspirates are unacceptable for Xpert Xpress SARS-CoV-2/FLU/RSV testing.  Fact Sheet for Patients: EntrepreneurPulse.com.au  Fact Sheet for Healthcare Providers: IncredibleEmployment.be  This test is not yet approved or cleared by the [place] FDA and has been authorized for detection and/or diagnosis of SARS-CoV-2 by FDA under an Emergency Use Authorization (EUA). This EUA will remain in effect (meaning this test can be used) for the duration of the COVID-19 declaration under Section 564(b)(1) of the Act, 21 U.S.C. section 360bbb-3(b)(1), unless the authorization is terminated or revoked.  Performed at [facility], [address] [place], [place] [address]         Radiology Studies: No results found.      Scheduled Meds: . donepezil  10 mg Oral QHS  . dutasteride  0.5 mg Oral Daily  . enoxaparin (LOVENOX) injection  0.5 mg/kg Subcutaneous Q24H  . insulin aspart  0-15 Units Subcutaneous TID WC  . insulin aspart  0-5 Units Subcutaneous QHS  .  lisinopril  20 mg Oral Daily  . risperiDONE  0.25 mg Oral BID  . sertraline  50 mg Oral Daily  . simvastatin  40 mg Oral QPM  . tamsulosin  0.4 mg Oral Daily  . triamterene-hydrochlorothiazide  1 tablet Oral Daily   Continuous Infusions: . cefTRIAXone (ROCEPHIN)  IV 200 mL/hr at 03/19/21 2115     LOS: 3 days     Cordelia Poche, MD [facility] 03/20/2021, 2:52 PM  If 7PM-7AM, please contact night-coverage [email or web address]

## 2021-03-20 NOTE — Consult Note (Signed)
Urology Consult   I have been asked to see the patient by Dr. Lonny Prude, for evaluation and management of UTI, history of urethral stricture and BPH.  Chief Complaint: Urinary retention  HPI:  Norman Palmer is a 80 y.o. year old comorbid and frail-appearing male who is currently admitted with confusion and UTI.  His past medical history is notable for dementia and Alzheimer's disease, diabetes, urethral stricture disease, and BPH.  He was previously followed by Dr. Jacqlyn Larsen and Dr. Wynetta Emery at [facility].  It sounds like he has previously undergone multiple urethral dilations and steroid injection for a bulbourethral stricture.  He was doing well over the last few years and has not had to catheterize and has been voiding spontaneously with low PVRs.  He was seen as recently as 1 month ago at [facility] and PVR was 0 mL.  He has had increasing difficulty voiding this hospitalization, and has only been able to void small amounts today, and bladder scan was significantly elevated at 630 mL this afternoon and urology was consulted.  Urine culture this admission is growing E. coli and he is on culture appropriate antibiotics.  The majority of history is obtained from chart review and the patient's wife, as the patient is a very poor historian secondary to his dementia.  PMH: Past Medical History:  Diagnosis Date  . Alzheimer disease ([place])   . Dementia ([place])   . Diabetes mellitus without complication ([place])   . H/O knee surgery 04/17/2015   Overview:  Bilateral knees surgery   . History of basal cell carcinoma (BCC) of skin 08/12/2019   MOHS Right alar crease [facility] Dr. Manley Mason 07/2019  . Infection with methicillin-resistant Staphylococcus aureus 08/22/2015  . Insomnia   . Lumbago   . Sciatica   . Thyroid disease     Surgical History: Past Surgical History:  Procedure Laterality Date  . knee surgery Bilateral 2000  . MOHS SURGERY Right 08/11/2019   BCC alar crease- [facility] Dr. Manley Mason  . SKIN GRAFT Bilateral  1955   legs  . URETHRAL STRICTURE DILATATION      Allergies:  Allergies  Allergen Reactions  . Doxycycline Rash  . Zinc Rash    Family History: Family History  Problem Relation Age of Onset  . Hypertension Father   . Cancer Brother        Liver Cancer    Social History:  reports that he has quit smoking. His smoking use included cigarettes. He has a 60.00 pack-year smoking history. He has quit using smokeless tobacco. He reports current alcohol use of about 1.0 - 2.0 standard drink of alcohol per week. He reports that he does not use drugs.  ROS: Negative aside from those stated in the HPI.  Physical Exam: BP (!) 135/54 (BP Location: Right Arm)   Pulse 87   Temp 99 F (37.2 C)   Resp 18   Ht 6' (1.829 m)   Wt 129.7 kg   SpO2 96%   BMI 38.79 kg/m    Constitutional: Elderly, frail-appearing Cardiovascular: No clubbing, cyanosis, or edema. Respiratory: Normal respiratory effort, no increased work of breathing. GI: Abdomen is obese soft, nontender, nondistended, no abdominal masses GU: Phallus with ventral erosion of the meatus Lymph: No cervical or inguinal lymphadenopathy. Skin: No rashes, bruises or suspicious lesions.  Laboratory Data: Reviewed in epic  Pertinent Imaging: None to review  Procedure: He was prepped and draped in standard sterile fashion.  Lidojet was instilled into the meatus.  A 14 French Coloplast coud catheter advanced easily into the bladder with return of clear yellow urine.  10 cc were placed in the balloon.  500 mL of yellow urine drained from the bladder.   Assessment & Plan:   80 year old comorbid and frail-appearing male with distant history of bulbourethral stricture managed at [facility] as well as BPH who is currently admitted with UTI and incomplete bladder emptying with recent PVR of 630 mL and difficulty voiding.  A catheter was placed at the bedside this afternoon with return of clear yellow urine.  Recommendations: Continue BPH  medications Maintain Foley ~ 1 week, can follow-up with his primary urologist at [facility] for a voiding trial, or this can be removed by nursing if he is discharged to a rehab/nursing facility Continue antibiotics for acute UTI Call if questions  [place], [place] [address], [place], [place] [address] [phone]

## 2021-03-20 NOTE — Progress Notes (Signed)
**Note Norman-Identified via Obfuscation** Physical Therapy Treatment Patient Details Name: Norman Palmer MRN: [id number] DOB: Sep 29, 1941 Today's Date: 03/20/2021    History of Present Illness Pt is a 80 yo male admitted to ED due to suspect UTI, at home has been experiencing increased weakness, falls, and requiring more assist as well as increased confusion. Previously able to ambulate. PMH of dementia, DM, thyroid disease.    PT Comments    Pt was long sitting in bed with breakfast tray in front of him upon arriving. He ate all food and was agreeable to OOB activity. Alert throughout however oriented x 2. Per spouse, still not at baseline cognition even with baseline cognition deficits.He required +2 assistance for safety. Was able to exit L side of bed, stand 3 x prior to ambulating to doorway of room. Chair follow for safety. HR elevated to 115 bpm with some SOB noted. Refused 2nd trial of ambulation however did agree to stand and return to bed. Pt will greatly benefit from SNF at [place] to address deficits while improving safe functional mobility/transfer/gait.    Follow Up Recommendations  SNF     Equipment Recommendations  None recommended by PT       Precautions / Restrictions Precautions Precautions: Fall Restrictions Weight Bearing Restrictions: No    Mobility  Bed Mobility Overal bed mobility: Needs Assistance Bed Mobility: Supine to Sit;Sit to Supine     Supine to sit: Min assist;Mod assist;HOB elevated;+2 for safety/equipment Sit to supine: Mod assist;Max assist (of one)   General bed mobility comments: Increased time to perform with vcs for step by step progression    Transfers Overall transfer level: Needs assistance Equipment used: Rolling walker (2 wheeled) Transfers: Sit to/from Stand Sit to Stand: +2 safety/equipment;Min assist;From elevated surface         General transfer comment: Pt performed STS 3 x EOB with progressively less assistance required. by third attempt. min assist +2 with amx  vcs for technqiue and sequencing.  Ambulation/Gait Ambulation/Gait assistance: Min assist Gait Distance (Feet): 15 Feet Assistive device: Rolling walker (2 wheeled) Gait Pattern/deviations: Trunk flexed;Step-to pattern Gait velocity: decreased   General Gait Details: Pt was able to ambulate to doorway of room with chair follow + min assist of one. Fatigue noted. HR elevated to 115bpm with sao2 >94% throughout. did have some SOB. refused another trial of ambulation.     Balance Overall balance assessment: Needs assistance Sitting-balance support: Feet supported Sitting balance-Leahy Scale: Fair     Standing balance support: Bilateral upper extremity supported;During functional activity Standing balance-Leahy Scale: Poor Standing balance comment: reliant on RW       Cognition Arousal/Alertness: Awake/alert Behavior During Therapy: WFL for tasks assessed/performed Overall Cognitive Status: Impaired/Different from baseline (per spouse) Area of Impairment: Orientation;Memory;Following commands;Safety/judgement;Awareness      Orientation Level: Disoriented to;Situation;Time   Memory: Decreased recall of precautions;Decreased short-term memory Following Commands: Follows one step commands consistently Safety/Judgement: Decreased awareness of safety;Decreased awareness of deficits   Problem Solving: Slow processing General Comments: Pt is alert throughout session however cognition does limit session progression             Pertinent Vitals/Pain Pain Assessment: No/denies pain Faces Pain Scale: No hurt Pain Intervention(s): Limited activity within patient's tolerance;Monitored during session;Premedicated before session;Repositioned           PT Goals (current goals can now be found in the care plan section) Acute Rehab PT Goals Patient Stated Goal: to go home Progress towards PT goals: Progressing toward goals  Frequency    Min 2X/week      PT Plan Current  plan remains appropriate       AM-PAC PT "6 Clicks" Mobility   Outcome Measure  Help needed turning from your back to your side while in a flat bed without using bedrails?: A Lot Help needed moving from lying on your back to sitting on the side of a flat bed without using bedrails?: A Lot Help needed moving to and from a bed to a chair (including a wheelchair)?: A Lot Help needed standing up from a chair using your arms (e.g., wheelchair or bedside chair)?: A Lot Help needed to walk in hospital room?: A Lot Help needed climbing 3-5 steps with a railing? : Total 6 Click Score: 11    End of Session Equipment Utilized During Treatment: Gait belt Activity Tolerance: Patient limited by fatigue Patient left: in bed;with call bell/phone within reach;with family/visitor present;with bed alarm set Nurse Communication: Mobility status PT Visit Diagnosis: Other abnormalities of gait and mobility (R26.89);Muscle weakness (generalized) (M62.81);Difficulty in walking, not elsewhere classified (R26.2)     Time: 4695-0722 PT Time Calculation (min) (ACUTE ONLY): 24 min  Charges:  $Gait Training: 8-22 mins $Therapeutic Activity: 8-22 mins                     Julaine Fusi PTA 03/20/21, 1:29 PM

## 2021-03-20 NOTE — Progress Notes (Signed)
Occupational Therapy Treatment Patient Details Name: Norman Palmer MRN: [id number] DOB: [birth year] Today's Date: 03/20/2021    History of present illness Pt is a 80 yo male admitted to ED due to suspect UTI, at home has been experiencing increased weakness, falls, and requiring more assist as well as increased confusion. Previously able to ambulate. PMH of dementia, DM, thyroid disease.   OT comments  Pt seen for OT treatment this AM to f/u re: safety with ADLs/ADL mobility. Chart review reveals that pt has been somewhat agitated this AM. He is mostly appropriate for OT with just light intermittent agitation that is easily re-directed. OT engages pt in seated UB bathing and dressing with MIN A. Pt requires MAX A with bed mobility and MAX A for sit<>stand with RW and moderate cues for safety/sequence. Pt tolerates sitting EOB x15 mins during functional UB ADL participation, but is noted to have waning seated balance when unsupported when fatiguing. Pt left in bed with bed alarm set, all needs met and in reach. Will continue to follow acutely. Continue to anticipate pt will require STR f/u in SNF setting for safety and strengthening with ADLs/ADL mobility.     Follow Up Recommendations  SNF    Equipment Recommendations  Other (comment) (defer)    Recommendations for Other Services      Precautions / Restrictions Precautions Precautions: Fall Restrictions Weight Bearing Restrictions: No       Mobility Bed Mobility Overal bed mobility: Needs Assistance Bed Mobility: Supine to Sit;Sit to Supine     Supine to sit: Max assist;HOB elevated Sit to supine: Max assist;Total assist   General bed mobility comments: increased time, HOB elevated to come to sitting, cues for sequence/use of bed rails/hand placement. Cues to use LEs to assist with propulsion to sitting. Pt requires increased assist for back to bed to manage trunk and LEs.    Transfers Overall transfer level: Needs  assistance Equipment used: Rolling walker (2 wheeled) Transfers: Sit to/from Stand Sit to Stand: Max assist;From elevated surface         General transfer comment: OT primarily physically assists with pt stand, however, his spouse wants to assist and OT directs spouse to hold walker down as pt is not following cues/commands to push from bed to standing rather than attempting to pull up on walker.    Balance Overall balance assessment: Needs assistance Sitting-balance support: Feet supported Sitting balance-Leahy Scale: Fair Sitting balance - Comments: able to sustain static sit for most of the session when alloted UE support, however, loses static sitting balance when attempting to perform UB ADLs with use of UEs that reduces his external/UE support   Standing balance support: Bilateral upper extremity supported;During functional activity Standing balance-Leahy Scale: Zero Standing balance comment: requires MAX A and reliant on RW, also noted to be bracing LEs on bed.                           ADL either performed or assessed with clinical judgement   ADL Overall ADL's : Needs assistance/impaired     Grooming: Sitting;Cueing for sequencing;Cueing for safety;Minimal assistance Grooming Details (indicate cue type and reason): seated EOB, pt requires MIN A for grooming d/t decreased unsupported sitting balance (SETUP when supported). requires cues throughout for sequencing/step by step. Upper Body Bathing: Minimal assistance;Moderate assistance;Sitting;Cueing for sequencing;Cueing for safety Upper Body Bathing Details (indicate cue type and reason): cues for sitting balance, cues to sequence next step of  task. MIN/MOD A for throughout completion/drying.   Lower Body Bathing Details (indicate cue type and reason): pt refuses LB bathing/dressing when prompted by OT Upper Body Dressing : Minimal assistance;Sitting;Cueing for sequencing Upper Body Dressing Details (indicate cue  type and reason): OT orients garment correctly. pt with difficulty pulling t-shirt over his back side.   Lower Body Dressing Details (indicate cue type and reason): pt refuses to participate in LB dressing with OT at this time.             Functional mobility during ADLs: Moderate assistance;Maximal assistance;Cueing for safety;Rolling walker (to take 2-3 shuffling side steps to his R with bari RW to optimize positioning in prep for returning to EOB sitting/return to supine.)       Vision Patient Visual Report: No change from baseline     Perception     Praxis      Cognition Arousal/Alertness: Awake/alert Behavior During Therapy: WFL for tasks assessed/performed;Agitated (some slight and intermittent agitation, he is mostly able to be re-directed by therapist.) Overall Cognitive Status: Impaired/Different from baseline Area of Impairment: Orientation;Memory;Following commands;Safety/judgement;Awareness                 Orientation Level: Disoriented to;Situation;Time   Memory: Decreased recall of precautions;Decreased short-term memory Following Commands: Follows one step commands consistently Safety/Judgement: Decreased awareness of safety;Decreased awareness of deficits   Problem Solving: Slow processing;Requires verbal cues;Requires tactile cues General Comments: pt is alert and participatory, able to follow commands. Pt with slow processing and benefits from tactile cues versus verbal cues ot help sequence, however, he does intermittently become agitated making tactile cues less ideal.        Exercises Other Exercises Other Exercises: OT engages pt in seated UB bathing and dressing with MIN A. Pt requires MAX A with bed mobility and MAX A for sit<>stand with RW and moderate cues for safety/sequence.   Shoulder Instructions       General Comments      Pertinent Vitals/ Pain       Pain Assessment: Faces Faces Pain Scale: Hurts a little bit Pain Location: some  slight grimace with attempts to mobilize (sit<>stand) Pain Descriptors / Indicators: Grimacing Pain Intervention(s): Monitored during session  Home Living                                          Prior Functioning/Environment              Frequency  Min 2X/week        Progress Toward Goals  OT Goals(current goals can now be found in the care plan section)  Progress towards OT goals: Progressing toward goals  Acute Rehab OT Goals Patient Stated Goal: to go home OT Goal Formulation: With patient/family Time For Goal Achievement: 04/01/21 Potential to Achieve Goals: [place] Discharge plan remains appropriate    Co-evaluation                 AM-PAC OT "6 Clicks" Daily Activity     Outcome Measure   Help from another person eating meals?: A Little Help from another person taking care of personal grooming?: A Little Help from another person toileting, which includes using toliet, bedpan, or urinal?: A Lot Help from another person bathing (including washing, rinsing, drying)?: A Lot Help from another person to put on and taking off regular upper body clothing?: A Little  Help from another person to put on and taking off regular lower body clothing?: A Lot 6 Click Score: 15    End of Session Equipment Utilized During Treatment: Gait belt;Rolling walker  OT Visit Diagnosis: Other abnormalities of gait and mobility (R26.89);Repeated falls (R29.6);Muscle weakness (generalized) (M62.81);Other symptoms and signs involving cognitive function   Activity Tolerance Other (comment);Patient tolerated treatment well (somewhat self-limiting, but appears more d/t some agitation/anxiety over general confusion about situation/place than anything else. tolerates moderately well.)   Patient Left in bed;with call bell/phone within reach;with bed alarm set;with family/visitor present;Other (comment) (with lab presenting to take blood)   Nurse Communication           Time: 8337-4451 OT Time Calculation (min): 32 min  Charges: OT General Charges $OT Visit: 1 Visit OT Treatments $Self Care/Home Management : 8-22 mins $Therapeutic Activity: 8-22 mins  Gerrianne Scale, [place], OTR/L ascom [phone] 03/20/21, 3:33 PM

## 2021-03-20 NOTE — Progress Notes (Signed)
Pt is very agitated this morning. While attempting to clean patient, he became very aggressive verbally and attempted to hit staff multiple times. Pt refused morning vital signs. Will continue to monitor.

## 2021-03-20 NOTE — Progress Notes (Signed)
Patient urinated 100cc, complained of not being able to urinate, bladder scan performed yielded 631, In and out cath attempted unable to perform per order, & MD aware of findings. Urology MD coming to the bedside.

## 2021-03-20 NOTE — TOC Progression Note (Signed)
Transition of Care [facility]) - Progression Note    Patient Details  Name: Norman Palmer MRN: [id number] Date of Birth: 1941/03/06  Transition of Care [facility]) CM/SW [place], LCSW Phone Number: 03/20/2021, 1:50 PM  Clinical Narrative: [facility] admissions coordinator said they will not have any discharges this week and are unsure when they will have beds open up next week. They would be willing to have patient transfer SNF to SNF once bed is available. Wife is aware and will discuss with her son.    Expected Discharge Plan: [place] Barriers to Discharge: Continued Medical Work up  Expected Discharge Plan and Services Expected Discharge Plan: [place] Choice: [place] arrangements for the past 2 months: Single Family Home                                       Social Determinants of Health (SDOH) Interventions    Readmission Risk Interventions No flowsheet data found.

## 2021-03-20 NOTE — Care Management Important Message (Signed)
Important Message  Patient Details  Name: Norman Palmer MRN: [id number] Date of Birth: February 07, 1941   Medicare Important Message Given:  Yes     Dannette Barbara 03/20/2021, 11:22 AM

## 2021-03-21 ENCOUNTER — Inpatient Hospital Stay: Payer: Medicare Other

## 2021-03-21 DIAGNOSIS — N390 Urinary tract infection, site not specified: Secondary | ICD-10-CM | POA: Diagnosis not present

## 2021-03-21 DIAGNOSIS — R338 Other retention of urine: Secondary | ICD-10-CM | POA: Diagnosis not present

## 2021-03-21 DIAGNOSIS — E119 Type 2 diabetes mellitus without complications: Secondary | ICD-10-CM | POA: Diagnosis not present

## 2021-03-21 DIAGNOSIS — N401 Enlarged prostate with lower urinary tract symptoms: Secondary | ICD-10-CM | POA: Diagnosis not present

## 2021-03-21 LAB — GLUCOSE, CAPILLARY
Glucose-Capillary: 191 mg/dL — ABNORMAL HIGH (ref 70–99)
Glucose-Capillary: 183 mg/dL — ABNORMAL HIGH (ref 70–99)
Glucose-Capillary: 195 mg/dL — ABNORMAL HIGH (ref 70–99)
Glucose-Capillary: 174 mg/dL — ABNORMAL HIGH (ref 70–99)

## 2021-03-21 IMAGING — MR MR HEAD W/O CM
11 series · 47 of 48 positions shown · non-contrast
Comparison: None.

CLINICAL DATA: Altered mental status

EXAM:
MRI HEAD WITHOUT CONTRAST
TECHNIQUE: Multiplanar, multiecho pulse sequences of the brain and surrounding
structures were obtained without intravenous contrast.

[Series 5: ax dwi_tracew · axial · 3.0mm · 0.65mm/px · z∈[-54,+100]mm · 4 of 48 slices shown]
[im 1/48]
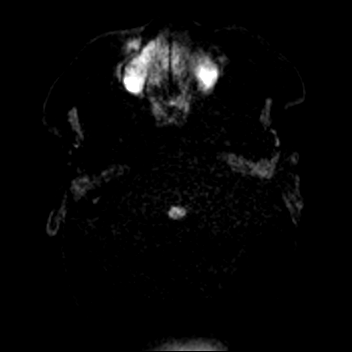
[im 16/48]
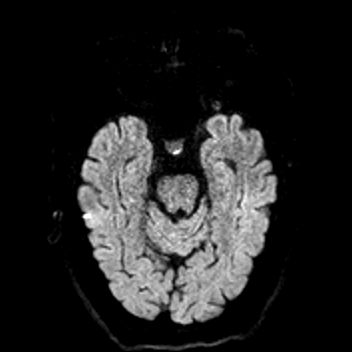
[im 32/48]
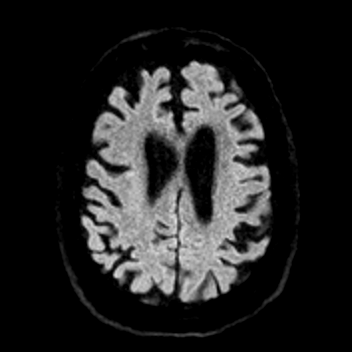
[im 48/48]
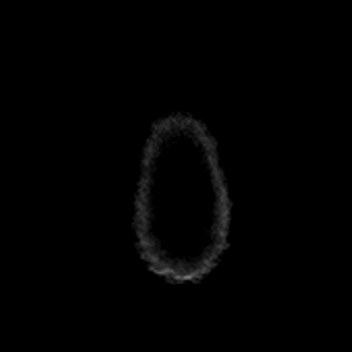

[Series 6: ax dwi_adc · axial · 3.0mm · 0.65mm/px · z∈[-54,+100]mm · 4 of 48 slices shown]
[im 1/48]
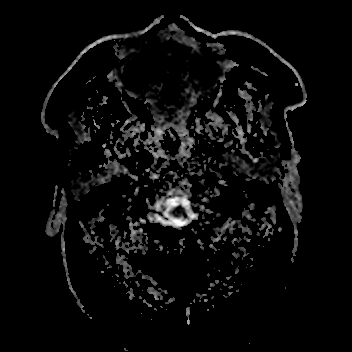
[im 16/48]
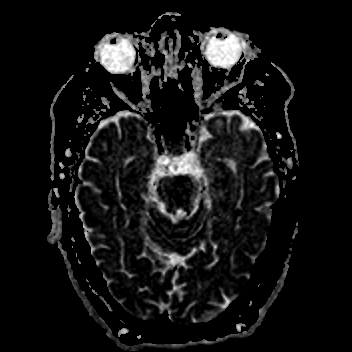
[im 32/48]
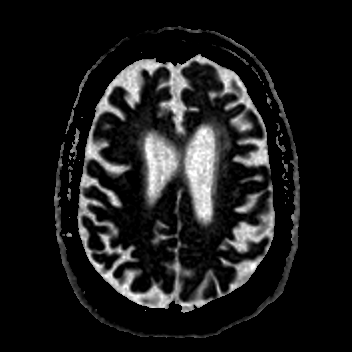
[im 48/48]
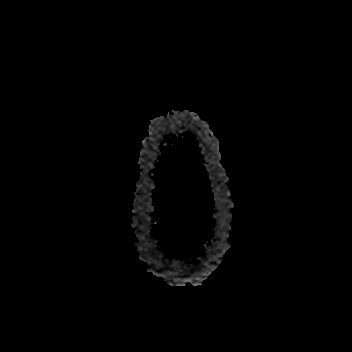

[Series 7: cor dwi_tracew · coronal · 5.0mm · 0.65mm/px · 3 of 40 slices shown]
[im 1/40]
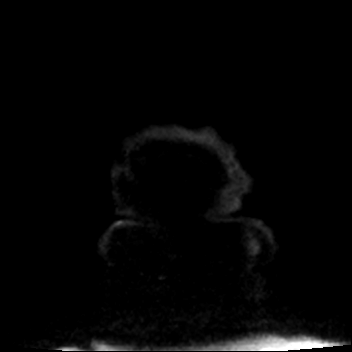
[im 20/40]
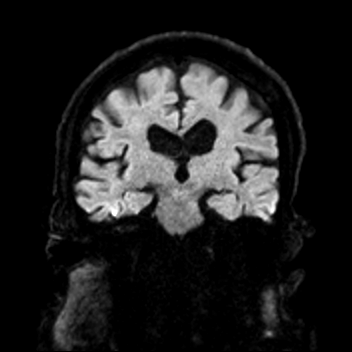
[im 40/40]
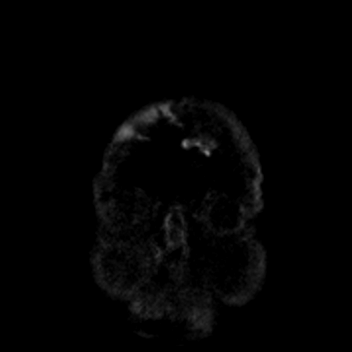

[Series 8: cor dwi_adc · coronal · 5.0mm · 0.65mm/px · 3 of 40 slices shown]
[im 1/40]
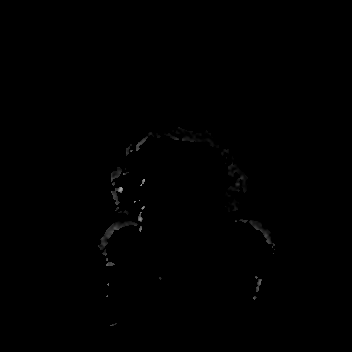
[im 20/40]
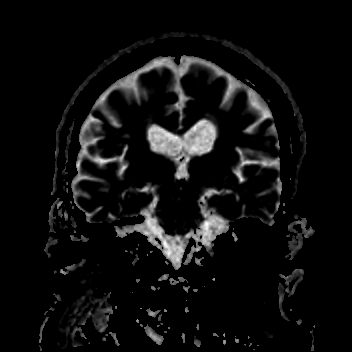
[im 40/40]
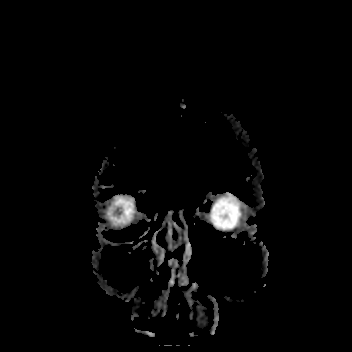

[Series 9: T1 · sagittal · 5.0mm · 0.62mm/px · 2 of 23 slices shown (1 of 2)]
[im 1/23]
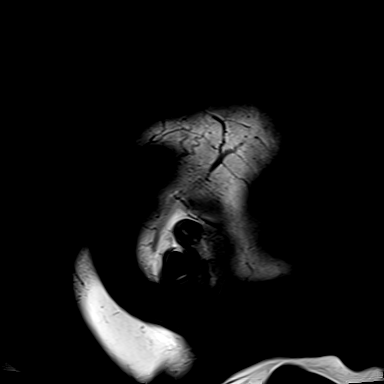
[im 23/23]
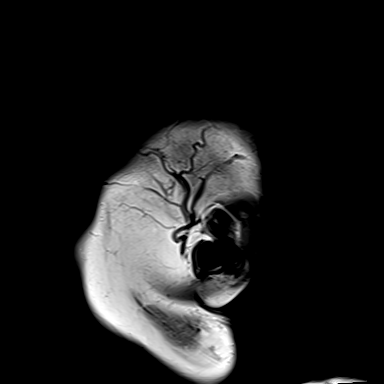

[Series 10: T2 · axial · 5.0mm · 0.53mm/px · z∈[-36,+105]mm · 2 of 25 slices shown (1 of 2)]
[im 1/25]
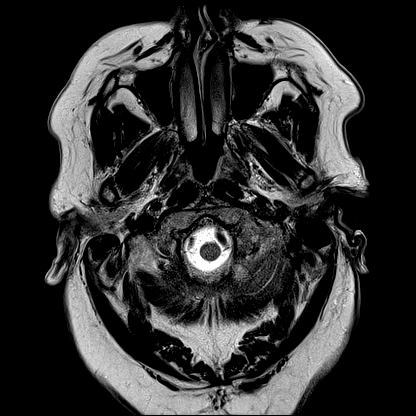
[im 25/25]
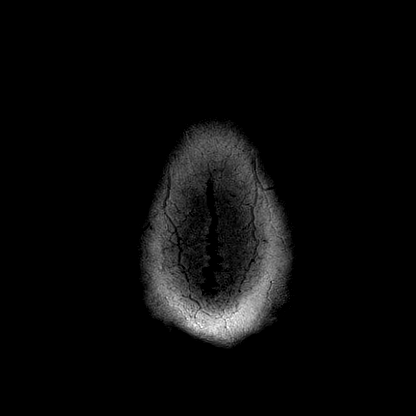

[Series 12: pha_images · axial · 3.0mm · 0.90mm/px · z∈[-48,+122]mm · 5 of 58 slices shown]
[im 1/58]
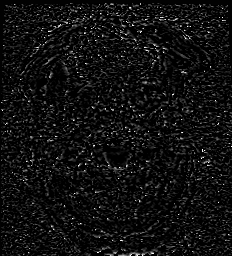
[im 15/58]
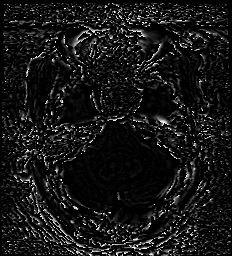
[im 29/58]
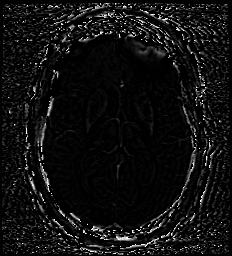
[im 43/58]
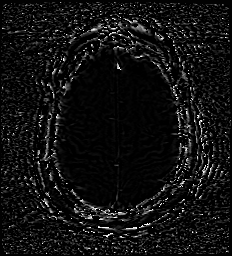
[im 58/58]
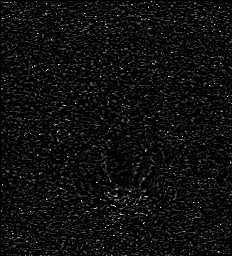

[Series 13: swi_images · axial · 3.0mm · 0.90mm/px · z∈[-51,+122]mm · 5 of 60 slices shown]
[im 1/60]
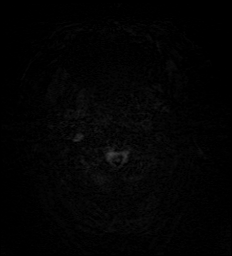
[im 15/60]
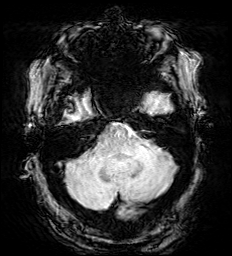
[im 30/60]
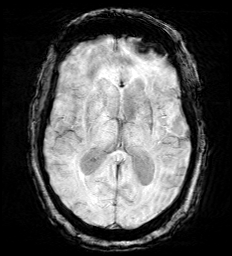
[im 45/60]
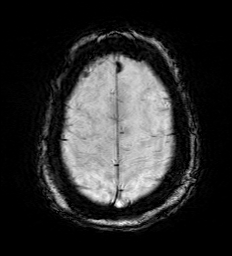
[im 60/60]
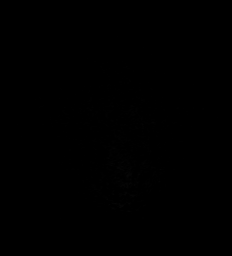

[Series 15: FLAIR · axial · 3.0mm · 0.53mm/px · z∈[-45,+114]mm · 4 of 55 slices shown]
[im 1/55]
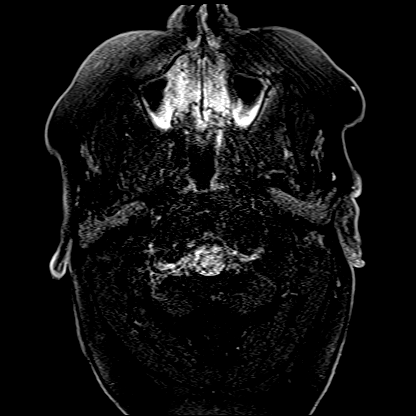
[im 19/55]
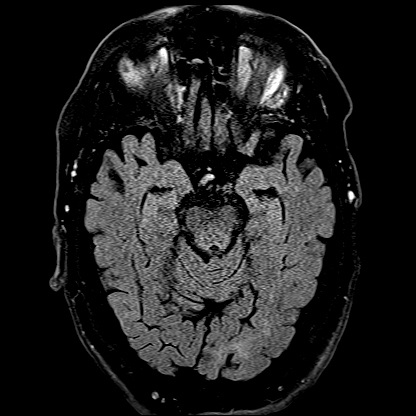
[im 37/55]
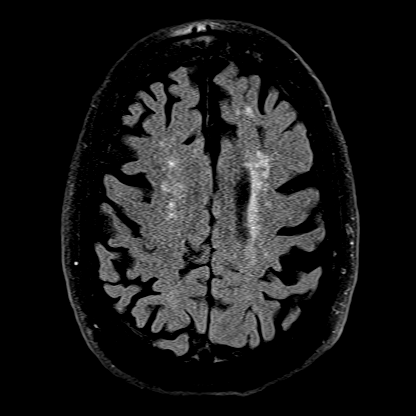
[im 55/55]
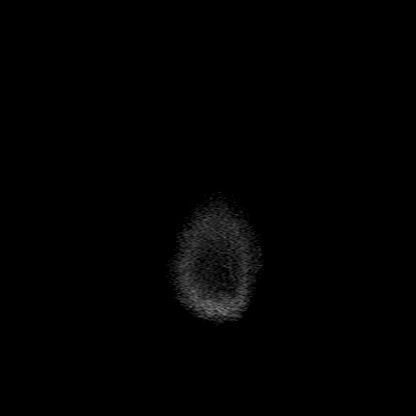

[Series 16: T1 · axial · 1.0mm · 0.98mm/px · z∈[-48,+124]mm · 13 of 175 slices shown (2 of 2)]
[im 1/175]
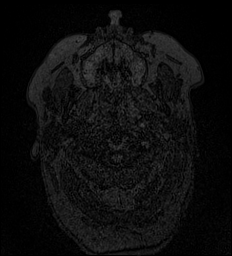
[im 14/175]
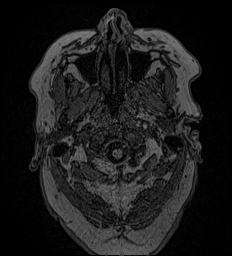
[im 27/175]
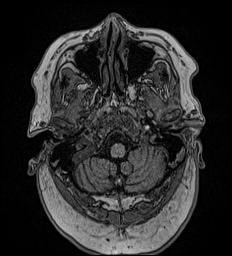
[im 41/175]
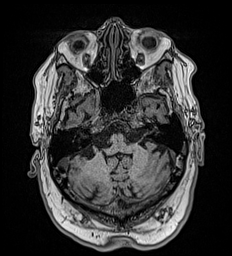
[im 54/175]
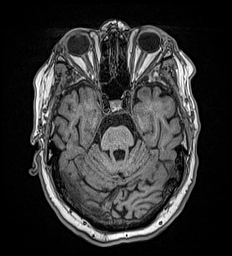
[im 67/175]
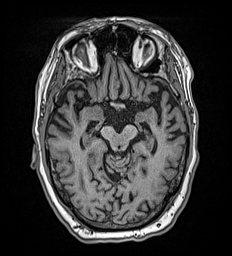
[im 81/175]
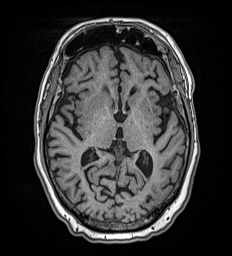
[im 94/175]
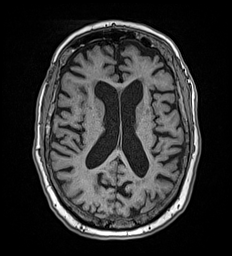
[im 108/175]
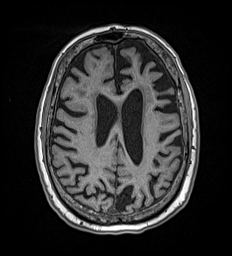
[im 121/175]
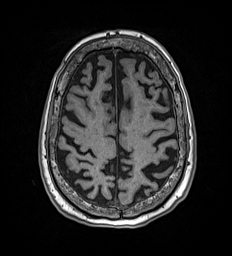
[im 134/175]
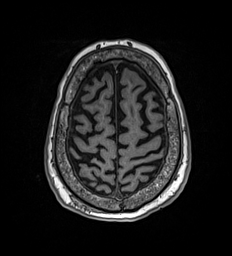
[im 148/175]
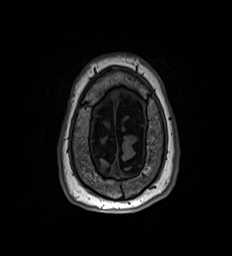
[im 175/175]
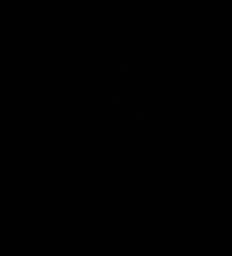

[Series 17: T2 · coronal · 5.0mm · 0.57mm/px · 2 of 29 slices shown (2 of 2)]
[im 1/29]
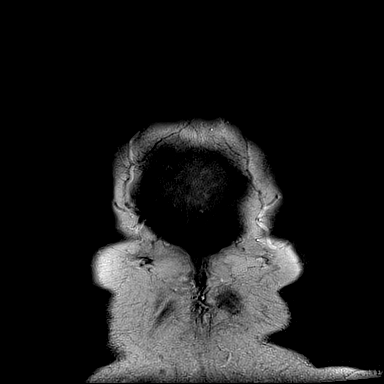
[im 29/29]
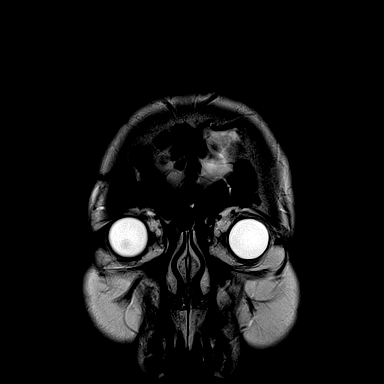

[47 of 48 positions shown; findings below may reference images not displayed]

FINDINGS: Brain: There is no acute infarction or intracranial hemorrhage.
There is no intracranial mass, mass effect, or edema. There is no
hydrocephalus or extra-axial fluid collection. Prominence of the
ventricles and sulci reflects generalized parenchymal volume loss.
Patchy and confluent areas of T2 hyperintensity in the
supratentorial and pontine white matter are nonspecific but may
reflect mild to moderate chronic microvascular ischemic changes.

Vascular: Major vessel flow voids at the skull base are preserved.

Skull and upper cervical spine: Normal marrow signal is preserved.

Sinuses/Orbits: Minor mucosal thickening.  Orbits are unremarkable.

Other: Sella is unremarkable.  Mastoid air cells are clear.
IMPRESSION: No evidence of recent infarction, hemorrhage, or mass. Mild to
moderate chronic microvascular ischemic changes.

## 2021-03-21 IMAGING — MR MR LUMBAR SPINE W/O CM
5 series · 30 of 48 positions shown · non-contrast
Comparison: CT of the lumbar spine [DATE].

CLINICAL DATA: Agitation and weakness in a patient with a history
of dementia. Multiple falls and low back pain.

EXAM:
MRI LUMBAR SPINE WITHOUT CONTRAST
TECHNIQUE: Multiplanar, multisequence MR imaging of the lumbar spine was
performed. No intravenous contrast was administered.

[Series 13: T2 · sagittal · 4.0mm · 0.81mm/px · 6 of 17 slices shown (1 of 2)]
[im 1/17]
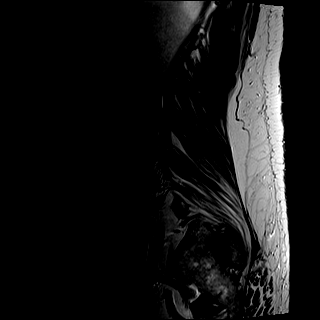
[im 4/17]
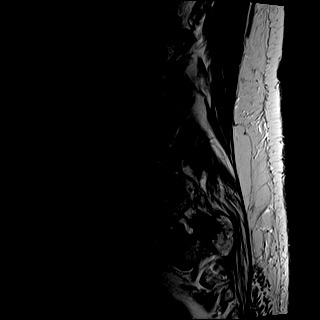
[im 7/17]
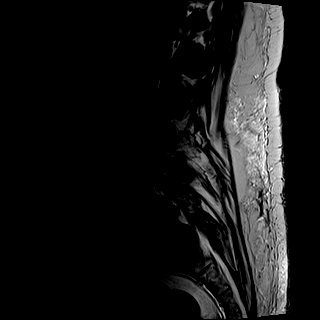
[im 10/17]
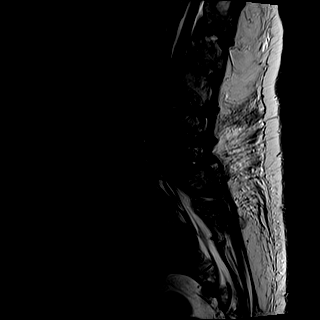
[im 13/17]
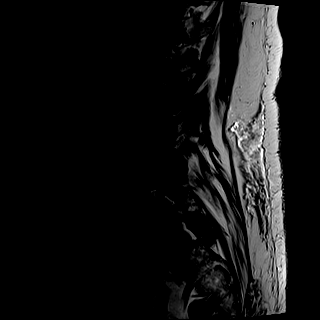
[im 17/17]
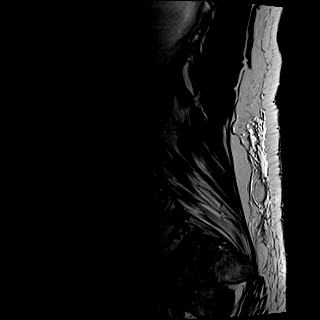

[Series 14: T1 · sagittal · 4.0mm · 0.81mm/px · 7 of 17 slices shown (1 of 2)]
[im 1/17]
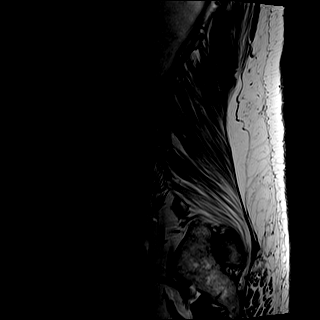
[im 3/17]
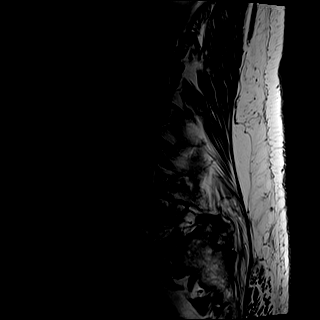
[im 6/17]
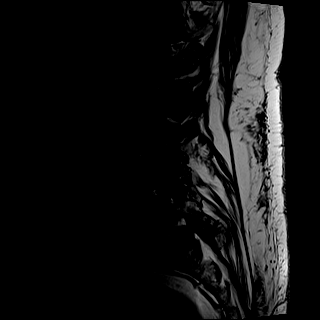
[im 9/17]
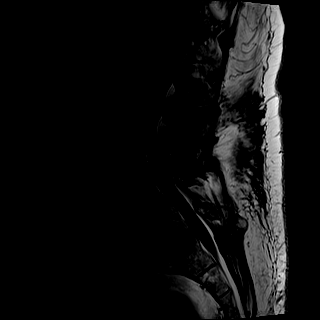
[im 11/17]
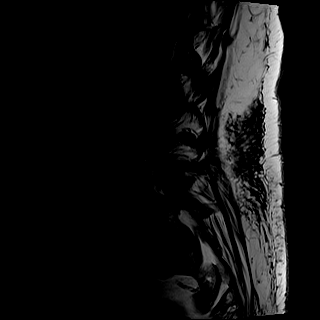
[im 14/17]
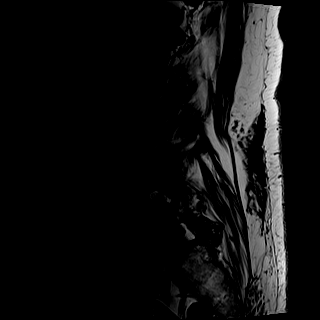
[im 17/17]
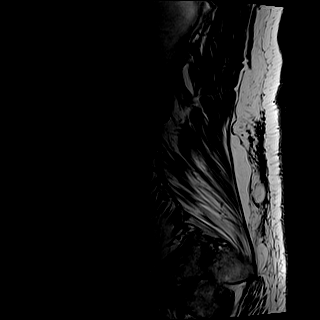

[Series 15: STIR · sagittal · 4.0mm · 0.41mm/px · 1 of 17 slices shown]
[im 1/17]
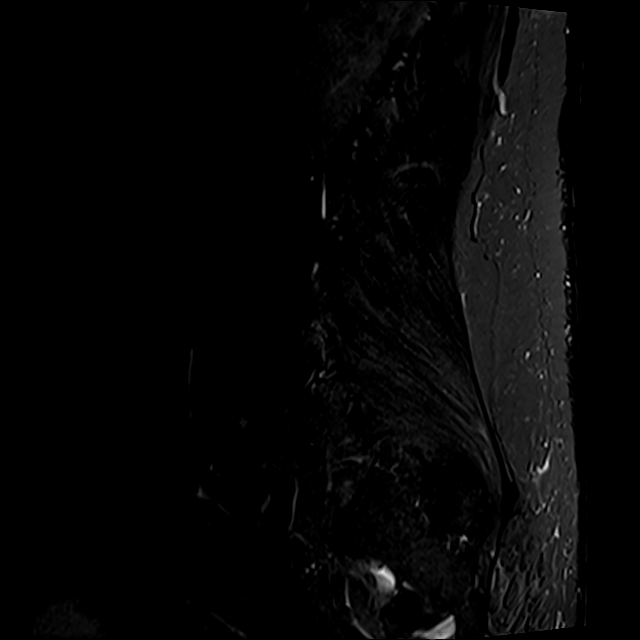

[Series 16: T2 · axial · 4.0mm · 0.78mm/px · z∈[-690,-457]mm · 8 of 33 slices shown (2 of 2)]
[im 1/33]
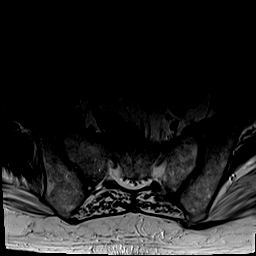
[im 5/33]
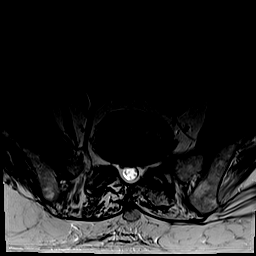
[im 10/33]
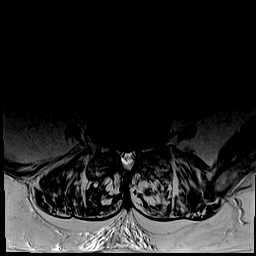
[im 15/33]
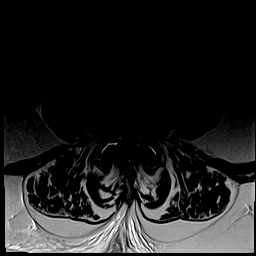
[im 18/33]
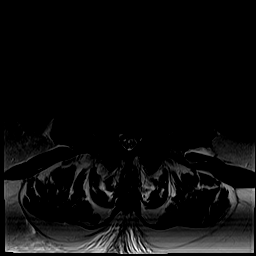
[im 23/33]
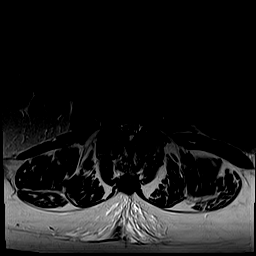
[im 28/33]
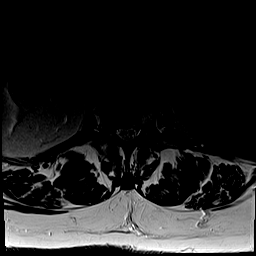
[im 33/33]
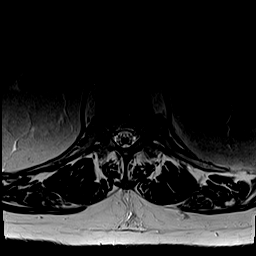

[Series 17: T1 · axial · 4.0mm · 0.39mm/px · z∈[-690,-457]mm · 8 of 33 slices shown (2 of 2)]
[im 1/33]
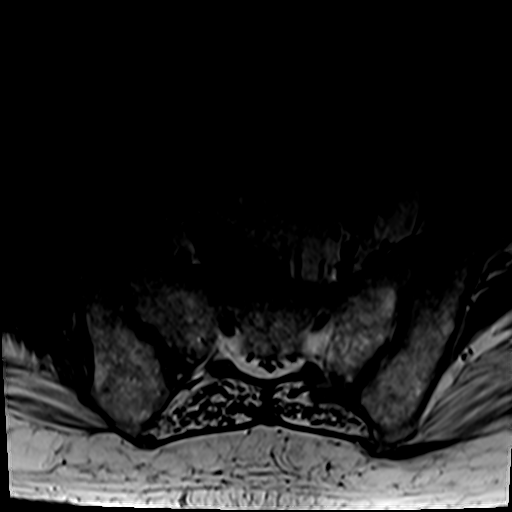
[im 5/33]
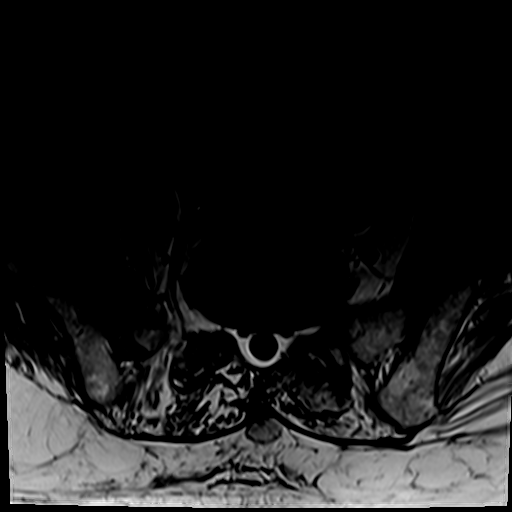
[im 10/33]
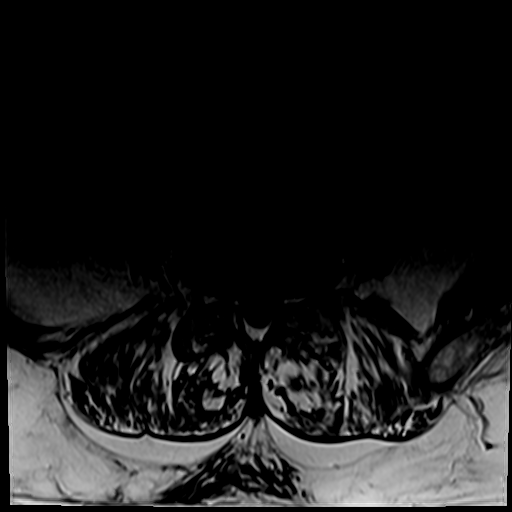
[im 15/33]
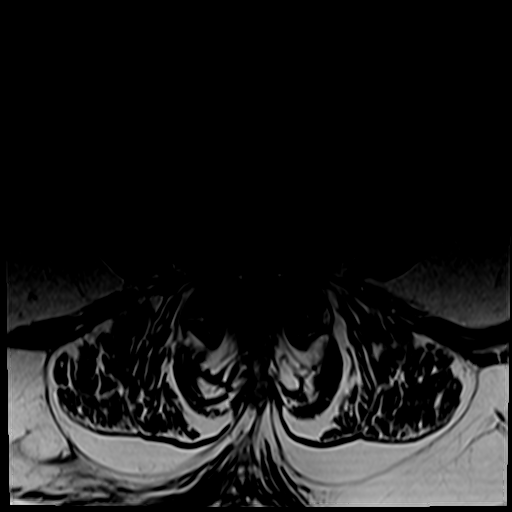
[im 18/33]
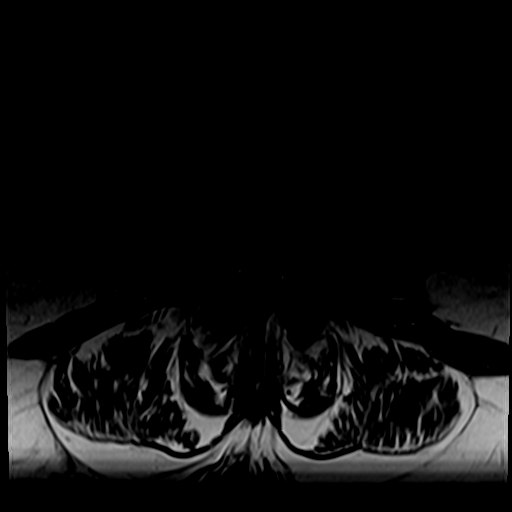
[im 23/33]
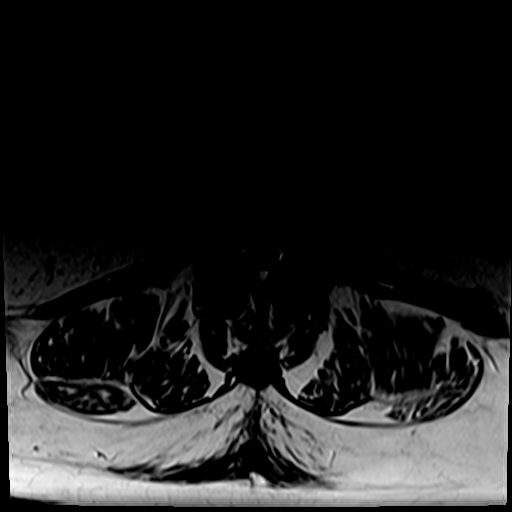
[im 28/33]
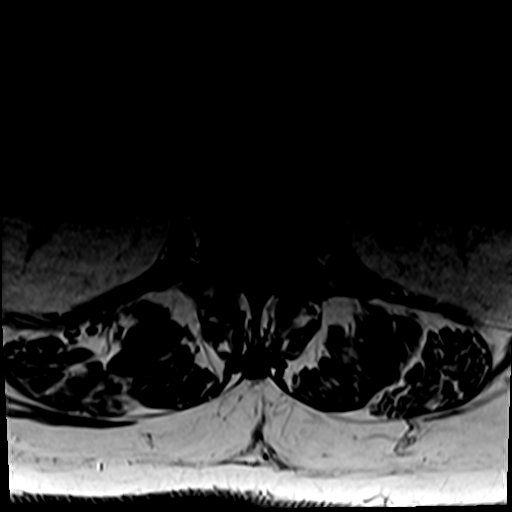
[im 33/33]
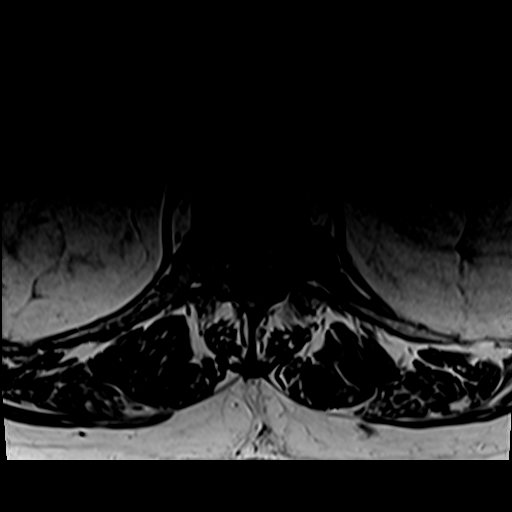

[30 of 48 positions shown; findings below may reference images not displayed]

FINDINGS: Segmentation:  Standard.

Alignment: Trace anterolisthesis L3 on L4 and 0.6 cm anterolisthesis
L4 on L5.

Vertebrae: No fracture, evidence of discitis, or bone lesion. Short
pedicle length results in some congenital narrowing of the central
canal, worst at L2-3 and L3-4.

Conus medullaris and cauda equina: Conus extends to the T12-L1
level. Conus and cauda equina appear normal.

Paraspinal and other soft tissues: Negative.

Disc levels:

T11-12 is imaged in the sagittal plane only and negative.

T12-L1: Shallow down turning central and right paracentral
protrusion. Mild central canal narrowing. Foramina are open.

L1-2: Shallow disc bulge, moderate facet arthropathy and mild
ligamentum flavum thickening. The central canal and foramina are
open.

L2-3: Broad-based disc bulge, moderate facet arthropathy and
ligamentum flavum thickening. There is moderately severe central
canal stenosis and narrowing of both subarticular recesses. Mild to
moderate foraminal narrowing is worse on the left.

L3-4: Advanced facet degenerative change and bulky ligamentum flavum
thickening. The disc is uncovered with a shallow bulge. There is
severe central canal stenosis and mild bilateral foraminal
narrowing.

L4-5: Advanced facet degenerative disease. The facets are ankylosed.
The disc is uncovered with a minimal bulge. The central canal and
right foramen are open. Mild left foraminal narrowing.

L5-S1: Moderate to moderately severe facet degenerative change is
worse on the right. Shallow disc bulge. No stenosis.
IMPRESSION: No acute abnormality.

Congenitally narrow central canal, particularly at L2-3 and L3-4.

Severe central canal and bilateral subarticular recess narrowing at
L3-4.

Moderately severe central canal and bilateral subarticular recess
narrowing L2-3. Mild to moderate foraminal narrowing at L2-3 is
worse on the left.

## 2021-03-21 NOTE — Progress Notes (Signed)
PROGRESS NOTE    Norman Palmer  FTD:[id number] DOB: July 23, 1941 DOA: 03/17/2021 PCP: Birdie Sons, MD   Brief Narrative: Norman Palmer is a 80 y.o. male with history of dementia, diabetes, hypothyroidism, basal cell carcinoma of the skin.  Patient resented secondary to agitation and weakness and was found to have an acute UTI.  Patient was started on empiric antibiotics.  Hospitalization complicated by delirium in setting of underlying dementia in addition to acute urinary retention.    Assessment & Plan:   Principal Problem:   AMS (altered mental status) Active Problems:   BPH with obstruction/lower urinary tract symptoms   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[place])   Essential hypertension   Hyperlipemia   Obstructive sleep apnea   Acute lower UTI   UTI Setting of underlying BPH.  Patient empirically treated with ceftriaxone IV urine culture is significant for E. coli, sensitive to ceftriaxone. -Continue ceftriaxone IV with plan for 7-day treatment course  Acute metabolic encephalopathy Delirium Dementia Patient's mental status changes appear to be likely multifactorial in setting of UTI in addition to underlying dementia.  Patient appears to be on risperidone as an outpatient. -Delirium precautions -Continue home Aricept, Zoloft -Discontinue risperidone  Weakness Generalized in setting of infection. Also has some focal RLE weakness greater than left. History of lumbar spine pathology. No other focal findings. Now with urinary retention, but this is in setting of known urethral stricture. -MRI Brain/lumbar spine  Diabetes mellitus, type II Peripheral neuropathy Patient is on NPH 30 units every evening in addition to glipizide and Metformin as an outpatient.  Patient started on sliding scale insulin only while inpatient -Continue sliding scale insulin -Continue home NPH at 20 units nightly for now, increase as needed  Acute urinary retention Patient with  a history of BPH and urethral stricture.  Complicated by UTI.  Bladder scan performed by nursing and was significant for a postvoid residual of 631 mL. Urology consulted and foley catheter was placed with recommendations for outpatient voiding trial in 1 week versus if at SNF, removal of foley at that time.  Hyperlipidemia -Continue simvastatin and dutasteride  BPH -Continue Flomax   DVT prophylaxis: Lovenox Code Status:   Code Status: Full Code Family Communication: Wife at bedside Disposition Plan: Likely medically stable for discharge in 1-3 days pending workup of weakness and change in affect   Consultants:   Urology  Procedures:   None  Antimicrobials:  Ceftriaxone IV   Subjective: No behavioral issues overnight.  Wife is concerned about patient's affect change today.  He is more withdrawn.  States his weakness is worse especially in the right lower extremity.  Objective: Vitals:   03/20/21 1705 03/20/21 1946 03/21/21 0758 03/21/21 1128  BP: (!) 111/58 (!) 150/66 (!) 145/61 (!) 166/55  Pulse: 79 79 84 67  Resp: 18 20 20  (!) 28  Temp: 99.4 F (37.4 C) 98.8 F (37.1 C) 98.6 F (37 C) 98.2 F (36.8 C)  TempSrc: Oral Oral Oral Oral  SpO2: 96% 95% 94% 99%  Weight:      Height:        Intake/Output Summary (Last 24 hours) at 03/21/2021 1205 Last data filed at 03/21/2021 1010 Gross per 24 hour  Intake 360 ml  Output 2806 ml  Net -2446 ml   Filed Weights   03/17/21 1807  Weight: 129.7 kg    Examination:  General exam: Appears calm and comfortable and in no acute distress. Conversant Respiratory: Clear to auscultation. Respiratory  effort normal with no intercostal retractions or use of accessory muscles Cardiovascular: S1 & S2 heard, RRR. No murmurs, rubs, gallops or clicks. No edema Gastrointestinal: Abdomen is nondistended, soft and nontender. No masses felt. Normal bowel sounds heard Neurologic: Cranial nerves intact.  Upper extremity strength is normal  and equal bilaterally.  Right lower extremity strength of 3 out of 5 compared to left lower extremity strength about 4 out of 5.  Reflexes are hyporeflexic in upper and lower extremities. Musculoskeletal: No calf tenderness Skin: No cyanosis. No new rashes Psychiatry: Alert and oriented to person, place, month and year.  Recent memory appears to be impaired.  Seems to be withdrawn today.   Data Reviewed: I have personally reviewed following labs and imaging studies  CBC Lab Results  Component Value Date   WBC 11.9 (H) 03/19/2021   RBC 3.49 (L) 03/19/2021   HGB 10.6 (L) 03/19/2021   HCT 32.7 (L) 03/19/2021   MCV 93.7 03/19/2021   MCH 30.4 03/19/2021   PLT 275 03/19/2021   MCHC 32.4 03/19/2021   RDW 12.3 03/19/2021   LYMPHSABS 2.2 03/17/2021   MONOABS 1.5 (H) 03/17/2021   EOSABS 0.1 03/17/2021   BASOSABS 0.1 22/97/9892     Last metabolic panel Lab Results  Component Value Date   NA 136 03/20/2021   K 4.6 03/20/2021   CL 105 03/20/2021   CO2 22 03/20/2021   BUN 39 (H) 03/20/2021   CREATININE 1.45 (H) 03/20/2021   GLUCOSE 211 (H) 03/20/2021   GFRNONAA 50 (L) 03/20/2021   GFRAA 43 (L) 11/26/2019   CALCIUM 8.9 03/20/2021   PROT 6.3 (L) 03/18/2021   ALBUMIN 3.4 (L) 03/18/2021   BILITOT 0.5 03/18/2021   ALKPHOS 46 03/18/2021   AST 18 03/18/2021   ALT 13 03/18/2021   ANIONGAP 9 03/20/2021    CBG (last 3)  Recent Labs    03/20/21 1705 03/20/21 2004 03/21/21 0740  GLUCAP 179* 224* 191*     GFR: Estimated Creatinine Clearance: 59.4 mL/min (A) (by C-G formula based on SCr of 1.45 mg/dL (H)).  Coagulation Profile: No results for input(s): INR, PROTIME in the last 168 hours.  Recent Results (from the past 240 hour(s))  Urine culture     Status: Abnormal   Collection Time: 03/17/21  7:42 PM   Specimen: Urine, Random  Result Value Ref Range Status   Specimen Description   Final    URINE, RANDOM Performed at [facility], [address]  [place], [place] [address]    Special Requests   Final    NONE Performed at [facility], [place], [place] [address]    Culture >=100,000 COLONIES/mL ESCHERICHIA COLI (A)  Final   Report Status 03/20/2021 FINAL  Final   Organism ID, Bacteria ESCHERICHIA COLI (A)  Final      Susceptibility   Escherichia coli - MIC*    AMPICILLIN >=32 RESISTANT Resistant     CEFAZOLIN >=64 RESISTANT Resistant     CEFEPIME <=0.12 SENSITIVE Sensitive     CEFTRIAXONE <=0.25 SENSITIVE Sensitive     CIPROFLOXACIN >=4 RESISTANT Resistant     GENTAMICIN <=1 SENSITIVE Sensitive     IMIPENEM <=0.25 SENSITIVE Sensitive     NITROFURANTOIN <=16 SENSITIVE Sensitive     TRIMETH/SULFA <=20 SENSITIVE Sensitive     AMPICILLIN/SULBACTAM >=32 RESISTANT Resistant     PIP/TAZO <=4 SENSITIVE Sensitive     * >=100,000 COLONIES/mL ESCHERICHIA COLI  Resp Panel by RT-PCR (Flu A&B, Covid)  Nasopharyngeal Swab     Status: None   Collection Time: 03/17/21  8:49 PM   Specimen: Nasopharyngeal Swab; Nasopharyngeal(NP) swabs in vial transport medium  Result Value Ref Range Status   SARS Coronavirus 2 by RT PCR NEGATIVE NEGATIVE Final    Comment: (NOTE) SARS-CoV-2 target nucleic acids are NOT DETECTED.  The SARS-CoV-2 RNA is generally detectable in upper respiratory specimens during the acute phase of infection. The lowest concentration of SARS-CoV-2 viral copies this assay can detect is 138 copies/mL. A negative result does not preclude SARS-Cov-2 infection and should not be used as the sole basis for treatment or other patient management decisions. A negative result may occur with  improper specimen collection/handling, submission of specimen other than nasopharyngeal swab, presence of viral mutation(s) within the areas targeted by this assay, and inadequate number of viral copies(<138 copies/mL). A negative result must be combined with clinical observations, patient history, and  epidemiological information. The expected result is Negative.  Fact Sheet for Patients:  EntrepreneurPulse.com.au  Fact Sheet for Healthcare Providers:  IncredibleEmployment.be  This test is no t yet approved or cleared by the [place] FDA and  has been authorized for detection and/or diagnosis of SARS-CoV-2 by FDA under an Emergency Use Authorization (EUA). This EUA will remain  in effect (meaning this test can be used) for the duration of the COVID-19 declaration under Section 564(b)(1) of the Act, 21 U.S.C.section 360bbb-3(b)(1), unless the authorization is terminated  or revoked sooner.       Influenza A by PCR NEGATIVE NEGATIVE Final   Influenza B by PCR NEGATIVE NEGATIVE Final    Comment: (NOTE) The Xpert Xpress SARS-CoV-2/FLU/RSV plus assay is intended as an aid in the diagnosis of influenza from Nasopharyngeal swab specimens and should not be used as a sole basis for treatment. Nasal washings and aspirates are unacceptable for Xpert Xpress SARS-CoV-2/FLU/RSV testing.  Fact Sheet for Patients: EntrepreneurPulse.com.au  Fact Sheet for Healthcare Providers: IncredibleEmployment.be  This test is not yet approved or cleared by the [place] FDA and has been authorized for detection and/or diagnosis of SARS-CoV-2 by FDA under an Emergency Use Authorization (EUA). This EUA will remain in effect (meaning this test can be used) for the duration of the COVID-19 declaration under Section 564(b)(1) of the Act, 21 U.S.C. section 360bbb-3(b)(1), unless the authorization is terminated or revoked.  Performed at [facility], [address] [place], [place] [address]         Radiology Studies: No results found.      Scheduled Meds: . Chlorhexidine Gluconate Cloth  6 each Topical Daily  . donepezil  10 mg Oral QHS  . dutasteride  0.5 mg Oral Daily  . enoxaparin (LOVENOX)  injection  0.5 mg/kg Subcutaneous Q24H  . insulin aspart  0-15 Units Subcutaneous TID WC  . insulin aspart  0-5 Units Subcutaneous QHS  . insulin detemir  20 Units Subcutaneous QHS  . lisinopril  20 mg Oral Daily  . sertraline  50 mg Oral Daily  . simvastatin  40 mg Oral QPM  . tamsulosin  0.4 mg Oral Daily  . triamterene-hydrochlorothiazide  1 tablet Oral Daily   Continuous Infusions: . cefTRIAXone (ROCEPHIN)  IV 1 g (03/20/21 2014)     LOS: 4 days     Cordelia Poche, MD [facility] 03/21/2021, 12:05 PM  If 7PM-7AM, please contact night-coverage [email or web address]

## 2021-03-21 NOTE — TOC Progression Note (Signed)
Transition of Care [facility]) - Progression Note    Patient Details  Name: Norman Palmer MRN: [id number] Date of Birth: 09-16-1941  Transition of Care [facility]) CM/SW [place], LCSW Phone Number: 03/21/2021, 3:54 PM  Clinical Narrative: Met with patient's wife and son, Shanon Brow, at bedside. They are wanting to bring patient home until a bed opens up at [facility]. Admissions coordinator has already explained to wife that she is unsure when a bed will be available. Provided wife and son with list of home health agencies that serve their zip code. Discussed hiring personal care services and DME needs. Sent secure chat to PT and OT to update and request DME recommendations next time they work with him.    Expected Discharge Plan: [place] Barriers to Discharge: Continued Medical Work up  Expected Discharge Plan and Services Expected Discharge Plan: [place] Choice: [place] arrangements for the past 2 months: Single Family Home                                       Social Determinants of Health (SDOH) Interventions    Readmission Risk Interventions No flowsheet data found.

## 2021-03-21 NOTE — Progress Notes (Signed)
PT Cancellation Note  Patient Details Name: Norman Palmer MRN: [id number] DOB: 09-21-1941   Cancelled Treatment:     PT attempt. Pt off floor for MRI.    Willette Pa 03/21/2021, 11:42 AM

## 2021-03-22 DIAGNOSIS — R338 Other retention of urine: Secondary | ICD-10-CM | POA: Diagnosis not present

## 2021-03-22 DIAGNOSIS — N390 Urinary tract infection, site not specified: Secondary | ICD-10-CM | POA: Diagnosis not present

## 2021-03-22 DIAGNOSIS — N401 Enlarged prostate with lower urinary tract symptoms: Secondary | ICD-10-CM | POA: Diagnosis not present

## 2021-03-22 DIAGNOSIS — M48061 Spinal stenosis, lumbar region without neurogenic claudication: Secondary | ICD-10-CM | POA: Diagnosis not present

## 2021-03-22 DIAGNOSIS — E119 Type 2 diabetes mellitus without complications: Secondary | ICD-10-CM | POA: Diagnosis not present

## 2021-03-22 LAB — GLUCOSE, CAPILLARY
Glucose-Capillary: 186 mg/dL — ABNORMAL HIGH (ref 70–99)
Glucose-Capillary: 238 mg/dL — ABNORMAL HIGH (ref 70–99)
Glucose-Capillary: 262 mg/dL — ABNORMAL HIGH (ref 70–99)
Glucose-Capillary: 224 mg/dL — ABNORMAL HIGH (ref 70–99)

## 2021-03-22 MED ORDER — SODIUM CHLORIDE 0.9 % IV SOLN
INTRAVENOUS | Status: DC | PRN
  Administered 2021-03-22: 500 mL via INTRAVENOUS

## 2021-03-22 NOTE — Consult Note (Signed)
NEUROLOGY CONSULTATION NOTE   Date of service: March 22, 2021 Patient Name: Norman Palmer MRN:  [id number] DOB:  Feb 05, 1941 Reason for consult: confusion, shaking, weakness _ _ _   _ __   _ __ _ _  __ __   _ __   __ _  History of Present Illness   Patient is a 80 yo man with hx moderate dementia w/ behavioral disturbance (managed by PCP), DM2, obesity, hypothyroidism, BCC skin admitted for confusion, agitation, weakness, and frequent falls. Found to have a UTI and was started on empiric abx. Hospitalization c/b delirium in setting of underlying dementia. Patient has also had tremulousness of BUE intermittently in the setting of acute illness with both postural and action components. There is no resting tremor. No prior dx Parkinson's.  Patient's family reports that until 3 weeks ago he was able to stand upright on his own and ambulate with a walker. Over the past 3 weeks his walking has deteriorated and he began to lean forward / hunch while walking and was unable to stand up straight. His legs have gotten progressively weaker over the same time period. 5 days ago when hospitalized he was able to stand with minimal assist however today he required 2 full person assist with PT and this evening I was unable to get him to stand at all (w/ 3 RNs helping).  Patient denies pain but also denies trouble walking. He has urinary catheter in place 2/2 intervention on urethral stricture therefore it is unknown is he has bladder dysfunction. He has not had bowel movement in last 3 days.   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 CNS imaging personally reviewed.  Case was discussed with Dr. Dava Najjar who reviewed the images. He will come see the patient in the morning and tentative plan is to coordinate outpatient surgical decompression after patient is optimized from diabetes, infection,  metabolic standpoints.   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 Alzheimer disease ([place])   . Dementia ([place])   . Diabetes mellitus without complication ([place])   . H/O knee surgery 04/17/2015   Overview:  Bilateral knees surgery   . History of basal cell carcinoma (BCC) of skin 08/12/2019   MOHS Right alar crease [facility] Dr. Manley Mason 07/2019  . Infection with methicillin-resistant Staphylococcus aureus 08/22/2015  . Insomnia   . Lumbago   . Sciatica   . Thyroid disease    Past Surgical History:  Procedure Laterality Date  . knee surgery Bilateral 2000  . MOHS SURGERY Right 08/11/2019   BCC alar crease- [facility] Dr. Manley Mason  . SKIN GRAFT Bilateral 1955   legs  . URETHRAL STRICTURE DILATATION     Family History  Problem Relation Age of Onset  . Hypertension Father   . Cancer Brother        Liver Cancer   Social History   Socioeconomic History  . Marital status: Married    Spouse name: Not on file  . Number of children: 3  . Years of education: MBA  . Highest education level: Master's degree (e.g., MA, MS, MEng, MEd, MSW, MBA)  Occupational History  . Occupation: retired  Tobacco Use  . Smoking status: Former Smoker  Packs/day: 2.00    Years: 30.00    Pack years: 60.00    Types: Cigarettes  . Smokeless tobacco: Former Systems developer  . Tobacco comment: quit over 20 years ago  Vaping Use  . Vaping Use: Never used  Substance and Sexual Activity  . Alcohol use: Yes    Alcohol/week: 1.0 - 2.0 standard drink    Types: 1 - 2 Cans of beer per week  . Drug use: No  . Sexual activity: Not on file  Other Topics Concern  . Not on file  Social History Narrative  . Not on file   Social Determinants of Health   Financial Resource Strain: Not on file  Food Insecurity: Not on file  Transportation Needs:  Not on file  Physical Activity: Not on file  Stress: Not on file  Social Connections: Not on file   Allergies  Allergen Reactions  . Doxycycline Rash  . Zinc Rash    Medications   Medications Prior to Admission  Medication Sig Dispense Refill Last Dose  . celecoxib (CELEBREX) 200 MG capsule Take 200 mg by mouth daily.    03/17/2021 at 0900  . donepezil (ARICEPT) 10 MG tablet Take 10 mg by mouth at bedtime.   2 03/16/2021 at Unknown time  . dutasteride (AVODART) 0.5 MG capsule TAKE 1 CAPSULE BY MOUTH ONCE DAILY (Patient taking differently: Take 0.5 mg by mouth daily.) 30 capsule 5 03/17/2021 at 0900  . insulin NPH Human (HUMULIN N,NOVOLIN N) 100 UNIT/ML injection Inject 30 Units into the skin every evening.    03/17/2021 at 0900  . lisinopril (ZESTRIL) 20 MG tablet TAKE 1 TABLET BY MOUTH DAILY 90 tablet 0 03/17/2021 at 0900  . metFORMIN (GLUCOPHAGE) 1000 MG tablet TAKE ONE TABLET TWICE DAILY WITH MEALS (Patient taking differently: Take 1,000 mg by mouth 2 (two) times daily with a meal.) 180 tablet 5 03/17/2021 at 0900  . risperiDONE (RISPERDAL) 0.25 MG tablet Take 0.25 mg by mouth 2 (two) times daily.   03/17/2021 at 0900  . sertraline (ZOLOFT) 50 MG tablet Take 50 mg by mouth daily.   03/17/2021 at 0900  . simvastatin (ZOCOR) 40 MG tablet Take 40 mg by mouth every evening.    03/16/2021 at Unknown time  . tamsulosin (FLOMAX) 0.4 MG CAPS capsule Take 1 capsule (0.4 mg total) by mouth daily. 30 capsule 6 03/17/2021 at 0900  . triamterene-hydrochlorothiazide (DYAZIDE) 37.5-25 MG capsule TAKE 1 CAPSULE BY MOUTH EACH MORNING 90 capsule 0 03/17/2021 at 0900  . Vitamin D, Ergocalciferol, (DRISDOL) 1.25 MG (50000 UNIT) CAPS capsule TAKE 1 CAPSULE BY MOUTH ONCE A WEEK 12 capsule 4 Past Week at Unknown time  . glipiZIDE-metformin (METAGLIP) 2.5-500 MG tablet Take 1 tablet by mouth 2 (two) times daily before a meal. 1 Tablet 2 Times daily With Meals (Patient not taking: No sig reported) 180 tablet 4 Not Taking  at Unknown time  . Potassium 99 MG TABS Take 1 tablet by mouth daily. (Patient not taking: No sig reported)   Not Taking at Unknown time     Vitals   Vitals:   03/22/21 0504 03/22/21 0811 03/22/21 1128 03/22/21 1546  BP: (!) 162/60 (!) 148/47 129/66 (!) 153/65  Pulse: 74 87 88 85  Resp: 20 18 (!) 24 (!) 24  Temp: 98.5 F (36.9 C) 98.6 F (37 C) 98.6 F (37 C) 97.6 F (36.4 C)  TempSrc: Oral Oral Oral Oral  SpO2: 93% 94% 94% 95%  Weight:  Height:         Body mass index is 38.79 kg/m.  Physical Exam   Physical Exam Gen: alert, oriented to self and family members, hospital. HEENT: Atraumatic, normocephalic;mucous membranes moist; oropharynx clear, tongue without atrophy or fasciculations. Neck: Supple, trachea midline. Resp: CTAB, no w/r/r CV: RRR, no m/g/r; nml S1 and S2. 2+ symmetric peripheral pulses. Abd: soft/NT/ND; nabs x 4 quad Extrem: Nml bulk; no cyanosis, clubbing, or edema.  Neuro: *MS: alert, oriented to self and family members, hospital. Follows simple commands and some multi-step. *Speech: fluid, nondysarthric, able to name and repeat *CN:    I: Deferred   II,III: PERRLA, VFF by confrontation   III,IV,VI: EOMI w/o nystagmus, no ptosis   V: Sensation intact from V1 to V3 to LT   VII: Eyelid closure was full.  Smile symmetric.   VIII: Hearing intact to voice   IX,X: Voice normal, palate elevates symmetrically    XI: SCM/trap 5/5 bilat   XII: Tongue protrudes midline, no atrophy or fasciculations   *Motor:   Normal bulk.  No resting tremor, rigidity or bradykinesia. + postural, action tremors BUE. No pronator drift. Barely anti-gravity bilateral HF, at least 4/5 in all other muscle groups BLE, exam limited 2/2 body habitus and participation. *Sensory: SILT, patient unable to comprehend more advanced sensory testing *Coordination:  action tremor bilat FNF *Reflexes:  2+ and symmetric BUE, 1+ bilat patellae, absent bilat achilles, toes mute *Gait: UTA,  unable to safely assist patient to stand even with assistance of 3 RNs  Unable to perform rectal exam 2/2 inability to position patient despite extensive efforts   Labs   CBC:  Recent Labs  Lab 03/17/21 1806 03/18/21 0504 03/19/21 0420  WBC 15.5* 11.6* 11.9*  NEUTROABS 11.6*  --   --   HGB 10.9* 10.2* 10.6*  HCT 34.9* 31.4* 32.7*  MCV 95.6 94.6 93.7  PLT 292 260 195    Basic Metabolic Panel:  Lab Results  Component Value Date   NA 136 03/20/2021   K 4.6 03/20/2021   CO2 22 03/20/2021   GLUCOSE 211 (H) 03/20/2021   BUN 39 (H) 03/20/2021   CREATININE 1.45 (H) 03/20/2021   CALCIUM 8.9 03/20/2021   GFRNONAA 50 (L) 03/20/2021   GFRAA 43 (L) 11/26/2019   Lipid Panel:  Lab Results  Component Value Date   [place] 90 11/20/2014   HgbA1c:  Lab Results  Component Value Date   HGBA1C 6.8 (H) 03/17/2021   Urine Drug Screen: No results found for: LABOPIA, COCAINSCRNUR, LABBENZ, AMPHETMU, THCU, LABBARB  Alcohol Level No results found for: [facility]   Impression   Patient is a 80 yo man with hx moderate dementia w/ behavioral disturbance (managed by PCP), DM2, obesity, hypothyroidism, BCC skin admitted for confusion, agitation, weakness, and frequent falls. 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 Case was discussed with Dr. Dava Najjar who reviewed the images. He will come see the patient in the morning and tentative plan is to coordinate outpatient surgical decompression after patient is optimized from diabetes, infection, metabolic standpoints.  Confusion is favored to be multifactorial 2/2 known UTI and delirium. Tremors observed are not Parkinsonian. If they persist and are concerning to patient/family after treatment of his medical issues he should be evaluated by outpatient neurology.  Recommendations   - Routine consult placed to neurosurgery as discussed - Will continue to  follow ______________________________________________________________________   Thank  you for the opportunity to take part in the care of this patient. If you have any further questions, please contact the neurology consultation attending.  Signed,  Su Monks, MD [facility] [phone]  If 7pm- 7am, please page neurology on call as listed in [place].

## 2021-03-22 NOTE — TOC Progression Note (Signed)
Transition of Care [facility]) - Progression Note    Patient Details  Name: Norman Palmer MRN: [id number] Date of Birth: Nov 14, 1941  Transition of Care [facility]) CM/SW [place], LCSW Phone Number: 03/22/2021, 2:07 PM  Clinical Narrative: [name] is able to offer patient a bed on Monday. Patient and his wife are aware.    Expected Discharge Plan: [place] Barriers to Discharge: Continued Medical Work up  Expected Discharge Plan and Services Expected Discharge Plan: [place] Choice: [place] arrangements for the past 2 months: Single Family Home                                       Social Determinants of Health (SDOH) Interventions    Readmission Risk Interventions No flowsheet data found.

## 2021-03-22 NOTE — Progress Notes (Signed)
Physical Therapy Treatment Patient Details Name: Norman Palmer MRN: [id number] DOB: 10-Apr-1941 Today's Date: 03/22/2021    History of Present Illness Pt is a 80 yo male admitted to ED due to suspect UTI, at home has been experiencing increased weakness, falls, and requiring more assist as well as increased confusion. Previously able to ambulate. PMH of dementia, DM, thyroid disease.    PT Comments    Pt was long sitting in bed finishing breakfast upon arriving. He has flat affect and resistive to session but with encouragement agrees. Supportive Wife arrives during session. " He has had a stroke.Look at him, he wasn't like this on last Sunday." Attempted to manage expectation however not much progress made. He was very resistive to getting OOB at first and has short episode of agitation/ combativeness. Eventually agrees to OOB activity however resistive throughout. When cooperating, was able to exit bed stand and take steps to recliner with RW. Anxiety and willingness more limiting than strength/physical abilities. Pt unwilling to trial ambulation. RN aware pt is in recliner and will call author to assist with returning to bed post lunch. Highly recommend DC to SNF to address deficits.        Follow Up Recommendations  SNF;Other (comment) (Highly recommend rehab to assist pt to PLOF)     Equipment Recommendations  Other (comment) (defer to next level of care)       Precautions / Restrictions Precautions Precautions: Fall Restrictions Weight Bearing Restrictions: No    Mobility  Bed Mobility Overal bed mobility: Needs Assistance Bed Mobility: Supine to Sit;Sit to Supine Rolling: +2 for physical assistance;Max assist   Supine to sit: +2 for safety/equipment;+2 for physical assistance;Max assist (pt unwillingness to assist required +2)     General bed mobility comments: Pt was resistive to OOB activity however does have strength required to perform with less assistance. required +2  assist due to pt 's cognition willingness to perform.    Transfers Overall transfer level: Needs assistance Equipment used: Rolling walker (2 wheeled) (bariatric) Transfers: Sit to/from Stand Sit to Stand: +2 safety/equipment;From elevated surface;Mod assist         General transfer comment: Pt stood 2 x mod assist +2 for safety from elevate dbed height  Ambulation/Gait Ambulation/Gait assistance: Mod assist;+2 safety/equipment Gait Distance (Feet): 3 Feet Assistive device: Rolling walker (2 wheeled)   Gait velocity: decreased   General Gait Details: took steps from EOB to recliner. Pt veryu anxiouse with poor posture and tolerance due to fear.       Balance Overall balance assessment: Needs assistance Sitting-balance support: Feet supported Sitting balance-Leahy Scale: Fair     Standing balance support: Bilateral upper extremity supported;During functional activity Standing balance-Leahy Scale: Poor Standing balance comment: +2 assist for all standing activity due to anxiety       Cognition Arousal/Alertness: Awake/alert Behavior During Therapy: Agitated;Anxious;Flat affect (Flat mostly but has episodes of anxiousness/agitation) Overall Cognitive Status: Impaired/Different from baseline (however history on dementia) Area of Impairment: Orientation;Memory;Following commands;Safety/judgement;Awareness      Orientation Level: Place;Time;Situation;Disoriented to   Memory: Decreased recall of precautions;Decreased short-term memory Following Commands: Follows one step commands inconsistently;Follows one step commands with increased time Safety/Judgement: Decreased awareness of safety;Decreased awareness of deficits Awareness: Anticipatory Problem Solving: Slow processing;Decreased initiation;Difficulty sequencing;Requires verbal cues;Requires tactile cues General Comments: Pt was alert however flat at rest. Progressed to agitation when encouraged for OOB activty.  behaviors of dementia witnessed. He was resistive to mobility but does demonstarte ability to perform desired task  throughout.             Pertinent Vitals/Pain Pain Assessment:  (reports pain with movements however unable to state where) Pain Location: unable to state Pain Descriptors / Indicators: Guarding (severe guarding due to anxiety and willingness to participate) Pain Intervention(s): Limited activity within patient's tolerance;Monitored during session;Repositioned           PT Goals (current goals can now be found in the care plan section) Acute Rehab PT Goals Patient Stated Goal: to go home Progress towards PT goals: Progressing toward goals    Frequency    Min 2X/week      PT Plan Current plan remains appropriate       AM-PAC PT "6 Clicks" Mobility   Outcome Measure  Help needed turning from your back to your side while in a flat bed without using bedrails?: A Lot Help needed moving from lying on your back to sitting on the side of a flat bed without using bedrails?: A Lot Help needed moving to and from a bed to a chair (including a wheelchair)?: A Lot Help needed standing up from a chair using your arms (e.g., wheelchair or bedside chair)?: A Lot Help needed to walk in hospital room?: A Lot Help needed climbing 3-5 steps with a railing? : A Lot 6 Click Score: 12    End of Session Equipment Utilized During Treatment: Gait belt Activity Tolerance: Patient limited by fatigue;Other (comment) (limited by anxiety/cognition impairments) Patient left: in chair;with chair alarm set;with nursing/sitter in room Nurse Communication: Mobility status PT Visit Diagnosis: Other abnormalities of gait and mobility (R26.89);Muscle weakness (generalized) (M62.81);Difficulty in walking, not elsewhere classified (R26.2)     Time: 8916-9450 PT Time Calculation (min) (ACUTE ONLY): 31 min  Charges:  $Therapeutic Activity: 23-37 mins                     Julaine Fusi  PTA 03/22/21, 9:43 AM

## 2021-03-22 NOTE — TOC Progression Note (Signed)
Transition of Care [facility]) - Progression Note    Patient Details  Name: BRAHIM DOLMAN MRN: [id number] Date of Birth: 1941-02-03  Transition of Care [facility]) CM/SW [place], LCSW Phone Number: 03/22/2021, 10:46 AM  Clinical Narrative:  Received text message from patient's wife late yesterday that [facility], [facility], and [facility]. [facility] is able to accept for PT, OT, RN, aide, SW and are aware of plan to transition to [facility] if a bed becomes available. Sent signed FL2 to [facility] admissions coordinator. She had said it would be good for 30 days. Patient's wife has been updated. Gave heads up referral to [facility] for hospital bed, hoyer lift, and 3-in-1.  Expected Discharge Plan: [place] Barriers to Discharge: Continued Medical Work up  Expected Discharge Plan and Services Expected Discharge Plan: [place] Choice: [place] arrangements for the past 2 months: Single Family Home                                       Social Determinants of Health (SDOH) Interventions    Readmission Risk Interventions No flowsheet data found.

## 2021-03-22 NOTE — Progress Notes (Signed)
PROGRESS NOTE    Norman Palmer  YWV:[id number] DOB: Jun 06, 1941 DOA: 03/17/2021 PCP: Birdie Sons, MD   Brief Narrative: Norman Palmer is a 80 y.o. male with history of dementia, diabetes, hypothyroidism, basal cell carcinoma of the skin.  Patient resented secondary to agitation and weakness and was found to have an acute UTI.  Patient was started on empiric antibiotics.  Hospitalization complicated by delirium in setting of underlying dementia in addition to acute urinary retention.    Assessment & Plan:   Principal Problem:   AMS (altered mental status) Active Problems:   BPH with obstruction/lower urinary tract symptoms   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[place])   Essential hypertension   Hyperlipemia   Obstructive sleep apnea   Acute lower UTI   UTI Setting of underlying BPH.  Patient empirically treated with ceftriaxone IV urine culture is significant for E. coli, sensitive to ceftriaxone. -Continue ceftriaxone IV with plan for 7-day treatment course  Acute metabolic encephalopathy Delirium Dementia Patient's mental status changes appear to be likely multifactorial in setting of UTI in addition to underlying dementia.  Patient is on risperidone as an outpatient which was initially restarted and now discontinued. -Delirium precautions -Continue home Aricept, Zoloft  Weakness Generalized in setting of infection. Also has some focal RLE weakness greater than left. History of lumbar spine pathology. No other focal findings. Now with urinary retention, but this is in setting of known urethral stricture. MRI brain without stroke. Lumbar spine with significant lumbar pathology but nothing acute. -Neurology consult in setting of flat affect, tremor, gait shuffling  Diabetes mellitus, type II Peripheral neuropathy Patient is on NPH 30 units every evening in addition to glipizide and Metformin as an outpatient.  Patient started on sliding scale insulin only while  inpatient -Continue sliding scale insulin -Increase to NPH 30 units nightly  Acute urinary retention Patient with a history of BPH and urethral stricture.  Complicated by UTI.  Bladder scan performed by nursing and was significant for a postvoid residual of 631 mL. Urology consulted and foley catheter was placed with recommendations for outpatient voiding trial in 1 week versus if at SNF, removal of foley at that time.  Hyperlipidemia -Continue simvastatin and dutasteride  BPH -Continue Flomax   DVT prophylaxis: Lovenox Code Status:   Code Status: Full Code Family Communication: Wife at bedside Disposition Plan: Likely medically stable for discharge in 3 days pending workup of weakness and change in affect in addition to SNF's ability to accept patient.   Consultants:   Urology  Neurology  Procedures:   None  Antimicrobials:  Ceftriaxone IV   Subjective: Still with flat affect. Weakness has continued to worsen. Patient reports no concerns.  Objective: Vitals:   03/22/21 0504 03/22/21 0811 03/22/21 1128 03/22/21 1546  BP: (!) 162/60 (!) 148/47 129/66 (!) 153/65  Pulse: 74 87 88 85  Resp: 20 18 (!) 24 (!) 24  Temp: 98.5 F (36.9 C) 98.6 F (37 C) 98.6 F (37 C) 97.6 F (36.4 C)  TempSrc: Oral Oral Oral Oral  SpO2: 93% 94% 94% 95%  Weight:      Height:        Intake/Output Summary (Last 24 hours) at 03/22/2021 1623 Last data filed at 03/22/2021 1300 Gross per 24 hour  Intake 480 ml  Output 2025 ml  Net -1545 ml   Filed Weights   03/17/21 1807  Weight: 129.7 kg    Examination:  General exam: Appears calm and comfortable and  in no acute distress. Conversant Respiratory: Clear to auscultation. Respiratory effort normal with no intercostal retractions or use of accessory muscles Cardiovascular: S1 & S2 heard, RRR. No murmurs, rubs, gallops or clicks. No edema Gastrointestinal: Abdomen is nondistended, soft and nontender. No masses felt. Normal bowel sounds  heard Neurologic: Strength in RLE is slightly improved but still 3/5. Upper extremities rigid on extension.  Musculoskeletal: No calf tenderness Skin: No cyanosis. No new rashes Psychiatry: Alert and oriented to person, place. Memory impaired. Mood is flat   Data Reviewed: I have personally reviewed following labs and imaging studies  CBC Lab Results  Component Value Date   WBC 11.9 (H) 03/19/2021   RBC 3.49 (L) 03/19/2021   HGB 10.6 (L) 03/19/2021   HCT 32.7 (L) 03/19/2021   MCV 93.7 03/19/2021   MCH 30.4 03/19/2021   PLT 275 03/19/2021   MCHC 32.4 03/19/2021   RDW 12.3 03/19/2021   LYMPHSABS 2.2 03/17/2021   MONOABS 1.5 (H) 03/17/2021   EOSABS 0.1 03/17/2021   BASOSABS 0.1 78/93/8101     Last metabolic panel Lab Results  Component Value Date   NA 136 03/20/2021   K 4.6 03/20/2021   CL 105 03/20/2021   CO2 22 03/20/2021   BUN 39 (H) 03/20/2021   CREATININE 1.45 (H) 03/20/2021   GLUCOSE 211 (H) 03/20/2021   GFRNONAA 50 (L) 03/20/2021   GFRAA 43 (L) 11/26/2019   CALCIUM 8.9 03/20/2021   PROT 6.3 (L) 03/18/2021   ALBUMIN 3.4 (L) 03/18/2021   BILITOT 0.5 03/18/2021   ALKPHOS 46 03/18/2021   AST 18 03/18/2021   ALT 13 03/18/2021   ANIONGAP 9 03/20/2021    CBG (last 3)  Recent Labs    03/21/21 2138 03/22/21 0738 03/22/21 1140  GLUCAP 174* 186* 238*     GFR: Estimated Creatinine Clearance: 59.4 mL/min (A) (by C-G formula based on SCr of 1.45 mg/dL (H)).  Coagulation Profile: No results for input(s): INR, PROTIME in the last 168 hours.  Recent Results (from the past 240 hour(s))  Urine culture     Status: Abnormal   Collection Time: 03/17/21  7:42 PM   Specimen: Urine, Random  Result Value Ref Range Status   Specimen Description   Final    URINE, RANDOM Performed at [facility], [address] [place], [place] [address]    Special Requests   Final    NONE Performed at [facility], [place], [place] [address]     Culture >=100,000 COLONIES/mL ESCHERICHIA COLI (A)  Final   Report Status 03/20/2021 FINAL  Final   Organism ID, Bacteria ESCHERICHIA COLI (A)  Final      Susceptibility   Escherichia coli - MIC*    AMPICILLIN >=32 RESISTANT Resistant     CEFAZOLIN >=64 RESISTANT Resistant     CEFEPIME <=0.12 SENSITIVE Sensitive     CEFTRIAXONE <=0.25 SENSITIVE Sensitive     CIPROFLOXACIN >=4 RESISTANT Resistant     GENTAMICIN <=1 SENSITIVE Sensitive     IMIPENEM <=0.25 SENSITIVE Sensitive     NITROFURANTOIN <=16 SENSITIVE Sensitive     TRIMETH/SULFA <=20 SENSITIVE Sensitive     AMPICILLIN/SULBACTAM >=32 RESISTANT Resistant     PIP/TAZO <=4 SENSITIVE Sensitive     * >=100,000 COLONIES/mL ESCHERICHIA COLI  Resp Panel by RT-PCR (Flu A&B, Covid) Nasopharyngeal Swab     Status: None   Collection Time: 03/17/21  8:49 PM   Specimen: Nasopharyngeal Swab; Nasopharyngeal(NP) swabs in vial transport medium  Result Value Ref Range Status   SARS Coronavirus 2 by RT PCR NEGATIVE NEGATIVE Final    Comment: (NOTE) SARS-CoV-2 target nucleic acids are NOT DETECTED.  The SARS-CoV-2 RNA is generally detectable in upper respiratory specimens during the acute phase of infection. The lowest concentration of SARS-CoV-2 viral copies this assay can detect is 138 copies/mL. A negative result does not preclude SARS-Cov-2 infection and should not be used as the sole basis for treatment or other patient management decisions. A negative result may occur with  improper specimen collection/handling, submission of specimen other than nasopharyngeal swab, presence of viral mutation(s) within the areas targeted by this assay, and inadequate number of viral copies(<138 copies/mL). A negative result must be combined with clinical observations, patient history, and epidemiological information. The expected result is Negative.  Fact Sheet for Patients:  EntrepreneurPulse.com.au  Fact Sheet for Healthcare  Providers:  IncredibleEmployment.be  This test is no t yet approved or cleared by the [place] FDA and  has been authorized for detection and/or diagnosis of SARS-CoV-2 by FDA under an Emergency Use Authorization (EUA). This EUA will remain  in effect (meaning this test can be used) for the duration of the COVID-19 declaration under Section 564(b)(1) of the Act, 21 U.S.C.section 360bbb-3(b)(1), unless the authorization is terminated  or revoked sooner.       Influenza A by PCR NEGATIVE NEGATIVE Final   Influenza B by PCR NEGATIVE NEGATIVE Final    Comment: (NOTE) The Xpert Xpress SARS-CoV-2/FLU/RSV plus assay is intended as an aid in the diagnosis of influenza from Nasopharyngeal swab specimens and should not be used as a sole basis for treatment. Nasal washings and aspirates are unacceptable for Xpert Xpress SARS-CoV-2/FLU/RSV testing.  Fact Sheet for Patients: EntrepreneurPulse.com.au  Fact Sheet for Healthcare Providers: IncredibleEmployment.be  This test is not yet approved or cleared by the [place] FDA and has been authorized for detection and/or diagnosis of SARS-CoV-2 by FDA under an Emergency Use Authorization (EUA). This EUA will remain in effect (meaning this test can be used) for the duration of the COVID-19 declaration under Section 564(b)(1) of the Act, 21 U.S.C. section 360bbb-3(b)(1), unless the authorization is terminated or revoked.  Performed at [facility], [address] [place], [place] [address]         Radiology Studies: MR BRAIN WO CONTRAST  Result Date: 03/21/2021 CLINICAL DATA:  Altered mental status EXAM: MRI HEAD WITHOUT CONTRAST TECHNIQUE: Multiplanar, multiecho pulse sequences of the brain and surrounding structures were obtained without intravenous contrast. COMPARISON:  None. FINDINGS: Brain: There is no acute infarction or intracranial hemorrhage. There is no  intracranial mass, mass effect, or edema. There is no hydrocephalus or extra-axial fluid collection. Prominence of the ventricles and sulci reflects generalized parenchymal volume loss. Patchy and confluent areas of T2 hyperintensity in the supratentorial and pontine white matter are nonspecific but may reflect mild to moderate chronic microvascular ischemic changes. Vascular: Major vessel flow voids at the skull base are preserved. Skull and upper cervical spine: Normal marrow signal is preserved. Sinuses/Orbits: Minor mucosal thickening.  Orbits are unremarkable. Other: Sella is unremarkable.  Mastoid air cells are clear. IMPRESSION: No evidence of recent infarction, hemorrhage, or mass. Mild to moderate chronic microvascular ischemic changes. Electronically Signed   By: Macy Mis M.D.   On: 03/21/2021 14:06   MR LUMBAR SPINE WO CONTRAST  Result Date: 03/21/2021 CLINICAL DATA:  Agitation and weakness in a patient with a history of dementia. Multiple falls and low back  pain. EXAM: MRI LUMBAR SPINE WITHOUT CONTRAST TECHNIQUE: Multiplanar, multisequence MR imaging of the lumbar spine was performed. No intravenous contrast was administered. COMPARISON:  CT of the lumbar spine 03/17/2021. FINDINGS: Segmentation:  Standard. Alignment: Trace anterolisthesis L3 on L4 and 0.6 cm anterolisthesis L4 on L5. Vertebrae: No fracture, evidence of discitis, or bone lesion. Short pedicle length results in some congenital narrowing of the central canal, worst at L2-3 and L3-4. Conus medullaris and cauda equina: Conus extends to the T12-L1 level. Conus and cauda equina appear normal. Paraspinal and other soft tissues: Negative. Disc levels: T11-12 is imaged in the sagittal plane only and negative. T12-L1: Shallow down turning central and right paracentral protrusion. Mild central canal narrowing. Foramina are open. L1-2: Shallow disc bulge, moderate facet arthropathy and mild ligamentum flavum thickening. The central canal  and foramina are open. L2-3: Broad-based disc bulge, moderate facet arthropathy and ligamentum flavum thickening. There is moderately severe central canal stenosis and narrowing of both subarticular recesses. Mild to moderate foraminal narrowing is worse on the left. L3-4: Advanced facet degenerative change and bulky ligamentum flavum thickening. The disc is uncovered with a shallow bulge. There is severe central canal stenosis and mild bilateral foraminal narrowing. L4-5: Advanced facet degenerative disease. The facets are ankylosed. The disc is uncovered with a minimal bulge. The central canal and right foramen are open. Mild left foraminal narrowing. L5-S1: Moderate to moderately severe facet degenerative change is worse on the right. Shallow disc bulge. No stenosis. IMPRESSION: No acute abnormality. 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 Severe central canal and bilateral subarticular recess narrowing at L3-4. Moderately severe central canal and bilateral subarticular recess narrowing L2-3. Mild to moderate foraminal narrowing at L2-3 is worse on the left. Electronically Signed   By: Inge Rise M.D.   On: 03/21/2021 15:14        Scheduled Meds: . Chlorhexidine Gluconate Cloth  6 each Topical Daily  . donepezil  10 mg Oral QHS  . dutasteride  0.5 mg Oral Daily  . enoxaparin (LOVENOX) injection  0.5 mg/kg Subcutaneous Q24H  . insulin aspart  0-15 Units Subcutaneous TID WC  . insulin aspart  0-5 Units Subcutaneous QHS  . insulin detemir  20 Units Subcutaneous QHS  . lisinopril  20 mg Oral Daily  . sertraline  50 mg Oral Daily  . simvastatin  40 mg Oral QPM  . tamsulosin  0.4 mg Oral Daily  . triamterene-hydrochlorothiazide  1 tablet Oral Daily   Continuous Infusions: . cefTRIAXone (ROCEPHIN)  IV 1 g (03/21/21 2213)     LOS: 5 days     Cordelia Poche, MD [facility] 03/22/2021, 4:23 PM  If 7PM-7AM, please contact night-coverage [email or web address]

## 2021-03-22 NOTE — Care Management Important Message (Signed)
Important Message  Patient Details  Name: Norman Palmer MRN: [id number] Date of Birth: November 12, 1941   Medicare Important Message Given:  Yes     Dannette Barbara 03/22/2021, 11:33 AM

## 2021-03-23 ENCOUNTER — Inpatient Hospital Stay: Payer: Medicare Other

## 2021-03-23 DIAGNOSIS — E119 Type 2 diabetes mellitus without complications: Secondary | ICD-10-CM | POA: Diagnosis not present

## 2021-03-23 DIAGNOSIS — M48061 Spinal stenosis, lumbar region without neurogenic claudication: Secondary | ICD-10-CM | POA: Diagnosis not present

## 2021-03-23 DIAGNOSIS — N401 Enlarged prostate with lower urinary tract symptoms: Secondary | ICD-10-CM | POA: Diagnosis not present

## 2021-03-23 DIAGNOSIS — R509 Fever, unspecified: Secondary | ICD-10-CM

## 2021-03-23 DIAGNOSIS — N390 Urinary tract infection, site not specified: Secondary | ICD-10-CM | POA: Diagnosis not present

## 2021-03-23 DIAGNOSIS — I10 Essential (primary) hypertension: Secondary | ICD-10-CM | POA: Diagnosis not present

## 2021-03-23 LAB — BASIC METABOLIC PANEL
Anion gap: 9 (ref 5–15)
BUN: 51 mg/dL — ABNORMAL HIGH (ref 8–23)
CO2: 22 mmol/L (ref 22–32)
Calcium: 8.4 mg/dL — ABNORMAL LOW (ref 8.9–10.3)
Chloride: 98 mmol/L (ref 98–111)
Creatinine, Ser: 1.36 mg/dL — ABNORMAL HIGH (ref 0.61–1.24)
GFR, Estimated: 54 mL/min — ABNORMAL LOW (ref >60)
Glucose, Bld: 214 mg/dL — ABNORMAL HIGH (ref 70–99)
Potassium: 4.5 mmol/L (ref 3.5–5.1)
Sodium: 129 mmol/L — ABNORMAL LOW (ref 135–145)

## 2021-03-23 LAB — CBC
HCT: 33.1 % — ABNORMAL LOW (ref 39.0–52.0)
Hemoglobin: 11 g/dL — ABNORMAL LOW (ref 13.0–17.0)
MCH: 30.3 pg (ref 26.0–34.0)
MCHC: 33.2 g/dL (ref 30.0–36.0)
MCV: 91.2 fL (ref 80.0–100.0)
Platelets: 333 10*3/uL (ref 150–400)
RBC: 3.63 MIL/uL — ABNORMAL LOW (ref 4.22–5.81)
RDW: 11.9 % (ref 11.5–15.5)
WBC: 17 10*3/uL — ABNORMAL HIGH (ref 4.0–10.5)
nRBC: 0 % (ref 0.0–0.2)

## 2021-03-23 LAB — GLUCOSE, CAPILLARY
Glucose-Capillary: 202 mg/dL — ABNORMAL HIGH (ref 70–99)
Glucose-Capillary: 216 mg/dL — ABNORMAL HIGH (ref 70–99)
Glucose-Capillary: 198 mg/dL — ABNORMAL HIGH (ref 70–99)
Glucose-Capillary: 278 mg/dL — ABNORMAL HIGH (ref 70–99)

## 2021-03-23 LAB — LACTIC ACID, PLASMA
Lactic Acid, Venous: 1.1 mmol/L (ref 0.5–1.9)
Lactic Acid, Venous: 1.1 mmol/L (ref 0.5–1.9)

## 2021-03-23 LAB — PROCALCITONIN: Procalcitonin: 0.27 ng/mL

## 2021-03-23 IMAGING — MR MR CERVICAL SPINE W/O CM
5 series · 35 of 48 positions shown · non-contrast
Comparison: Lumbar MRI [DATE].  CT lumbar spine [DATE].

CLINICAL DATA: Agitation and weakness. History of dementia and
Parkinson's disease. Multiple falls. Cervical radiculopathy and mid
back pain.

EXAM:
MRI CERVICAL AND THORACIC SPINE WITHOUT CONTRAST
TECHNIQUE: Multiplanar and multiecho pulse sequences of the cervical spine, to
include the craniocervical junction and cervicothoracic junction,
and the thoracic spine, were obtained without intravenous contrast.

[Series 1: T2 · sagittal · 3.0mm · 0.62mm/px · 6 of 15 slices shown (1 of 2)]
[im 1/15]
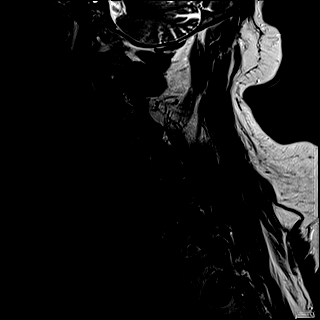
[im 3/15]
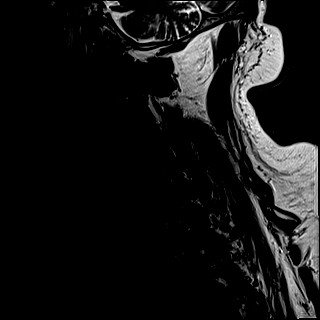
[im 6/15]
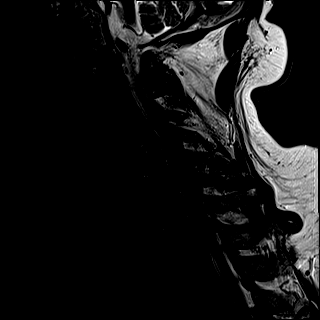
[im 9/15]
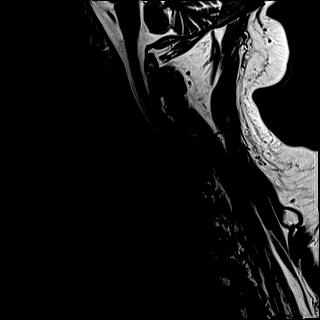
[im 12/15]
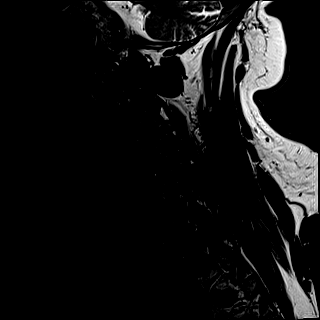
[im 15/15]
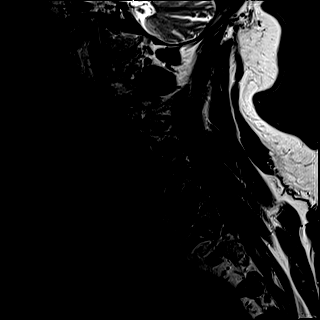

[Series 2: FLAIR · sagittal · 3.0mm · 0.78mm/px · 7 of 15 slices shown]
[im 1/15]
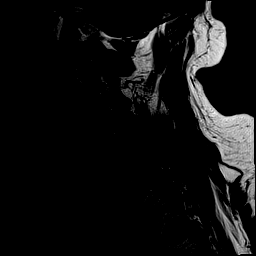
[im 3/15]
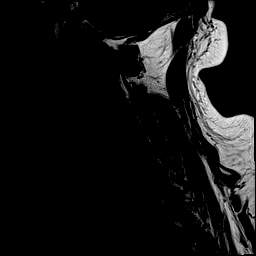
[im 5/15]
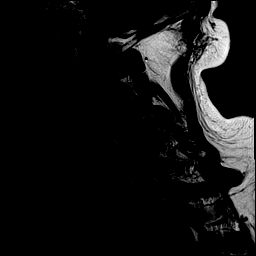
[im 8/15]
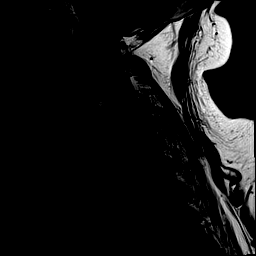
[im 10/15]
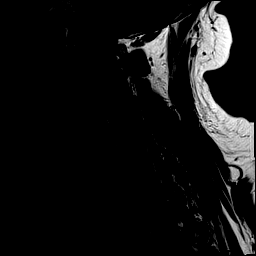
[im 12/15]
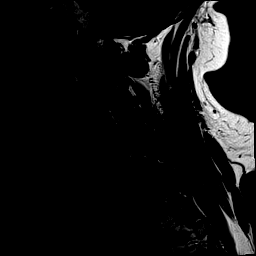
[im 15/15]
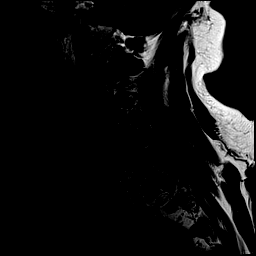

[Series 3: STIR · sagittal · 3.0mm · 0.62mm/px · 7 of 15 slices shown]
[im 1/15]
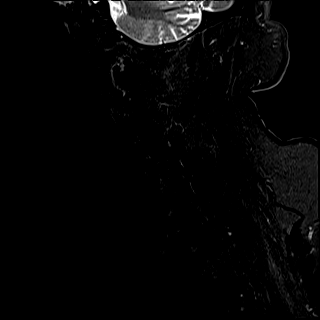
[im 3/15]
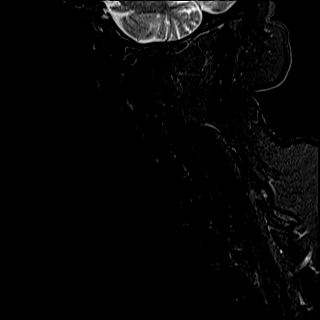
[im 5/15]
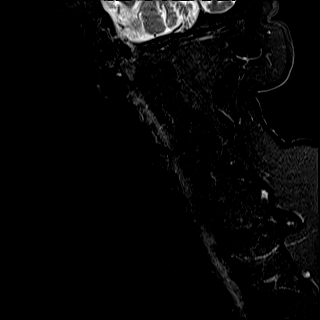
[im 8/15]
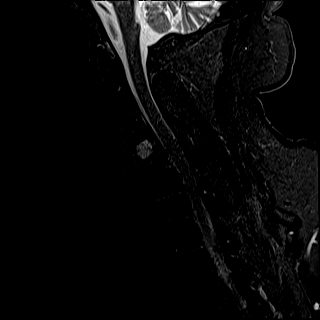
[im 10/15]
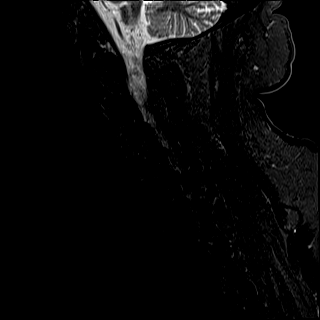
[im 12/15]
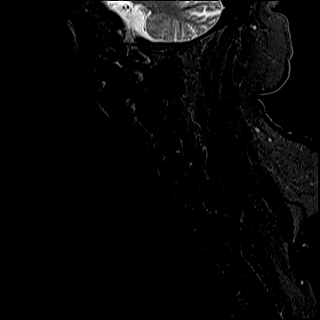
[im 15/15]
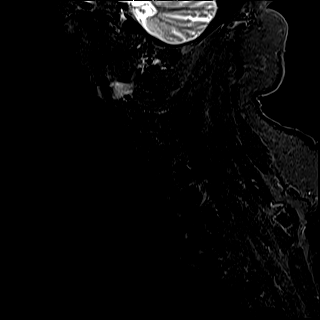

[Series 4: T2 · axial · 3.0mm · 0.70mm/px · z∈[-72,+26]mm · 8 of 31 slices shown (2 of 2)]
[im 1/31]
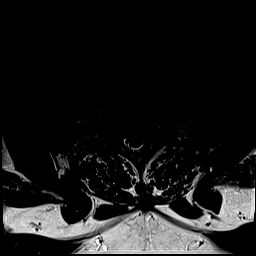
[im 5/31]
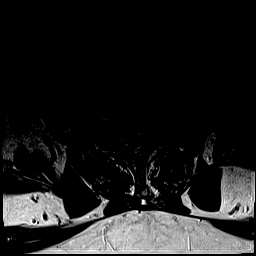
[im 10/31]
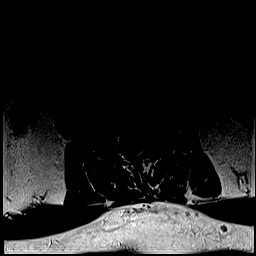
[im 14/31]
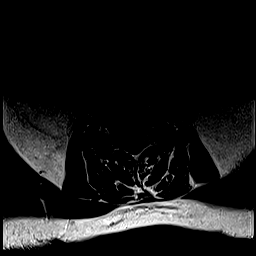
[im 17/31]
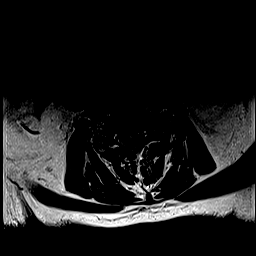
[im 21/31]
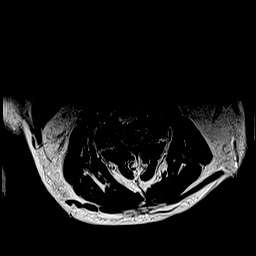
[im 26/31]
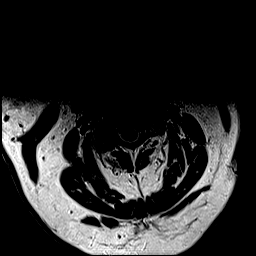
[im 31/31]
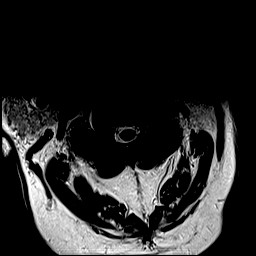

[Series 5: ax mpgr · axial · 3.0mm · 0.35mm/px · z∈[-72,+10]mm · 7 of 31 slices shown]
[im 1/31]
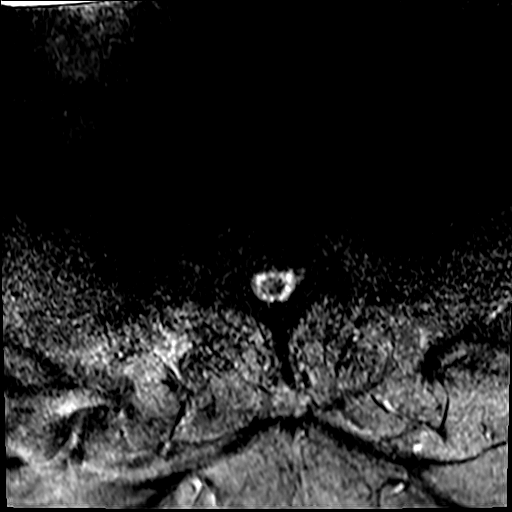
[im 5/31]
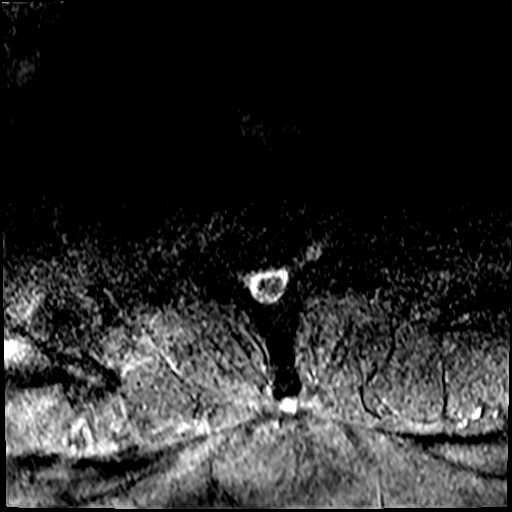
[im 10/31]
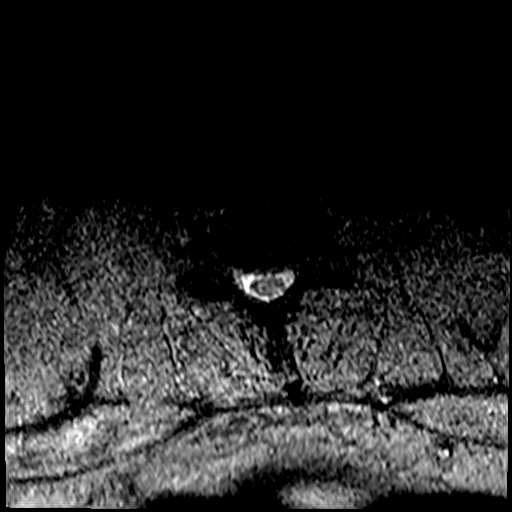
[im 14/31]
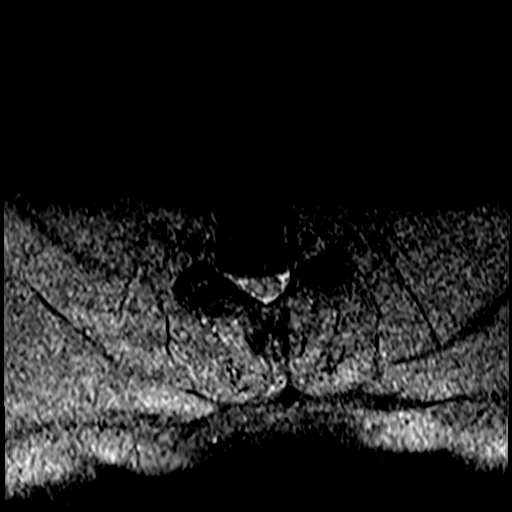
[im 17/31]
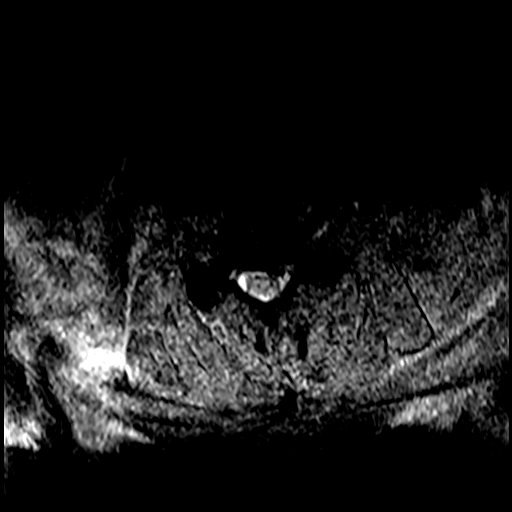
[im 21/31]
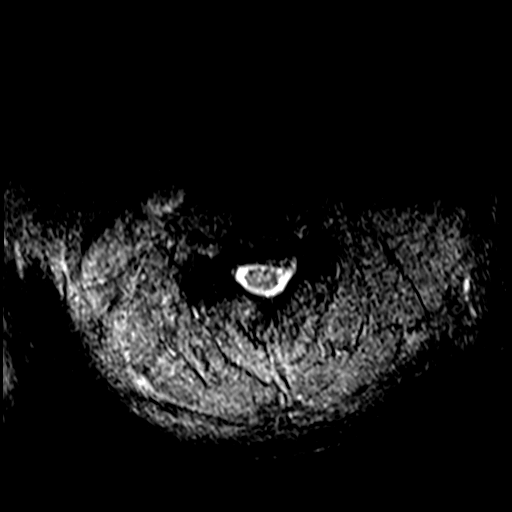
[im 26/31]
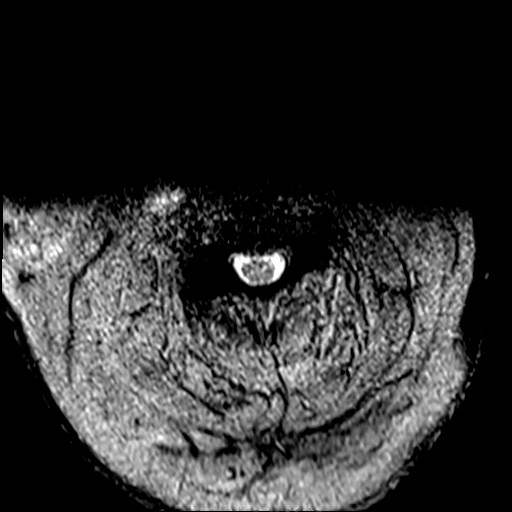

[35 of 48 positions shown; findings below may reference images not displayed]

FINDINGS: MRI CERVICAL SPINE FINDINGS

Alignment: Straightening without focal angulation or significant
listhesis.

Vertebrae: There is an indeterminate well-circumscribed lesion
centrally in the C4 vertebral body which measures 12 mm in diameter.
This demonstrates low T1 and high T2 signal. No other suspicious
osseous lesions. No evidence of acute fracture. There is probable
ankylosis of the facet joints on the right at C2-3 and C3-4.

Cord: The CSF surrounding the cord is partially effaced, but there
is no cord deformity or abnormal cord signal.

Posterior Fossa, vertebral arteries, paraspinal tissues: The
visualized posterior fossa appears unremarkable. There are bilateral
vertebral artery flow voids. No paraspinal abnormalities are seen.

Disc levels:

C2-3: The disc appears normal. The right facet joint appears
ankylosed. No spinal stenosis or nerve root encroachment.

C3-4: Mild disc bulging and uncinate spurring. The right facet joint
appears ankylosed. No cord deformity or nerve root encroachment.

C4-5: Spondylosis with posterior osteophytes covering diffusely
bulging disc material. There is asymmetric uncinate spurring on the
left contributing to effacement of the CSF surrounding the cord.
There is no cord deformity. Moderate foraminal narrowing
bilaterally.

C5-6: Spondylosis with posterior osteophytes covering diffusely
bulging disc material. The CSF surrounding the cord is effaced
without cord deformity. Moderate foraminal narrowing bilaterally.

C6-7: Spondylosis with posterior osteophytes covering diffusely
bulging disc material. The CSF surrounding the cord is effaced
without cord deformity. Mild right and moderate left foraminal
narrowing.

C7-T1: Mild disc bulging and facet hypertrophy. No cord deformity or
significant foraminal compromise.

MRI THORACIC SPINE FINDINGS

Alignment:  Normal.

Vertebrae: No evidence of acute fracture or suspicious marrow
lesion. A lesion centrally in the T5 vertebral body demonstrates
areas of high T1 and T2 signal, most consistent with a hemangioma.

Cord: Normal in signal and caliber. The conus medullaris extends to
the L1 level.

Paraspinal and other soft tissues: No paraspinal abnormalities.

Disc levels:

There is mild disc bulging and endplate osteophyte formation
throughout the thoracic spine. There is spurring at the
costovertebral junctions. No large disc herniation or significant
spinal stenosis demonstrated.
IMPRESSION: 1. No acute findings are identified within the cervicothoracic
spine.
2. There is multilevel spondylosis with disc bulging, endplate
osteophytes and facet hypertrophy as described. The CSF surrounding
the cord is partially effaced from C4-5 through C6-7 without
associated cord deformity or abnormal cord signal. Moderate
foraminal narrowing is present at these levels.
3. No significant spinal stenosis or nerve root encroachment in the
thoracic spine.
4. Indeterminate T2 hyperintense lesion in the C4 vertebral body.
This could reflect a metastasis, but no other suspicious marrow
lesions are identified. Suggest follow-up MRI of the cervical spine
without and with contrast in 3-6 months.

## 2021-03-23 IMAGING — MR MR THORACIC SPINE W/O CM
6 series · 31 of 48 positions shown · non-contrast
Comparison: Lumbar MRI [DATE].  CT lumbar spine [DATE].

CLINICAL DATA: Agitation and weakness. History of dementia and
Parkinson's disease. Multiple falls. Cervical radiculopathy and mid
back pain.

EXAM:
MRI CERVICAL AND THORACIC SPINE WITHOUT CONTRAST
TECHNIQUE: Multiplanar and multiecho pulse sequences of the cervical spine, to
include the craniocervical junction and cervicothoracic junction,
and the thoracic spine, were obtained without intravenous contrast.

[Series 18: T1 · sagittal · 6.0mm · 1.41mm/px · 2 of 9 slices shown (1 of 2)]
[im 1/9]
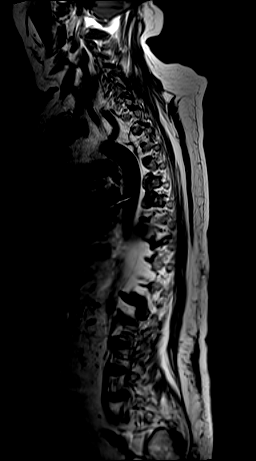
[im 9/9]
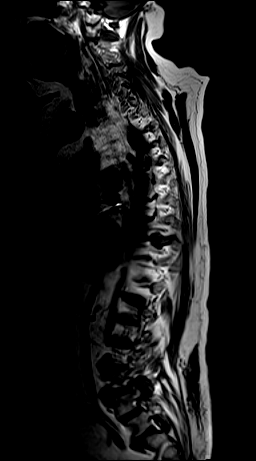

[Series 19: T2 · sagittal · 3.0mm · 1.33mm/px · 6 of 17 slices shown (1 of 2)]
[im 1/17]
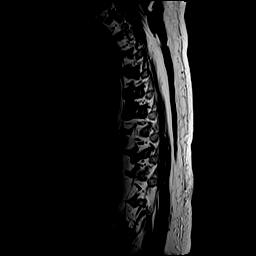
[im 4/17]
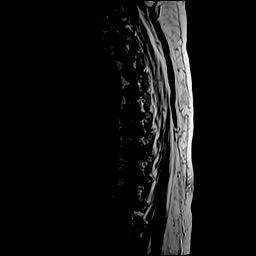
[im 7/17]
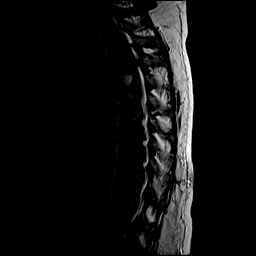
[im 10/17]
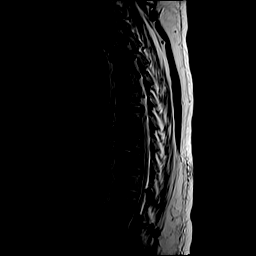
[im 13/17]
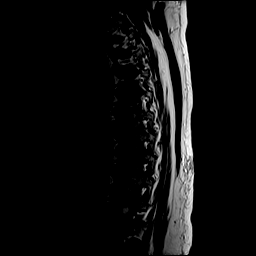
[im 17/17]
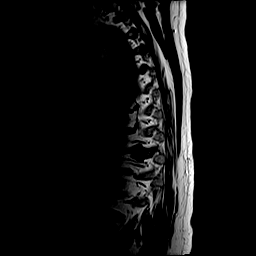

[Series 20: T1 · sagittal · 3.0mm · 1.33mm/px · 6 of 17 slices shown (2 of 2)]
[im 1/17]
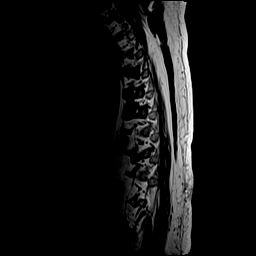
[im 4/17]
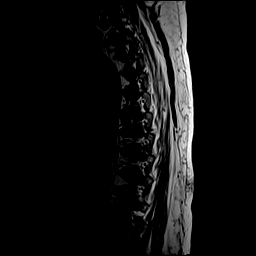
[im 7/17]
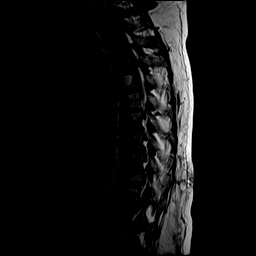
[im 10/17]
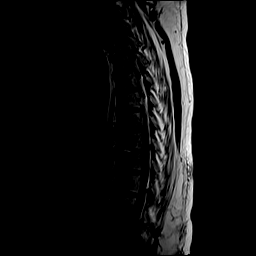
[im 13/17]
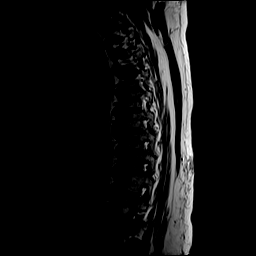
[im 17/17]
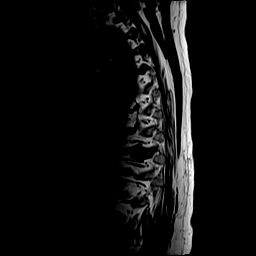

[Series 21: STIR · sagittal · 3.0mm · 0.66mm/px · 6 of 17 slices shown]
[im 1/17]
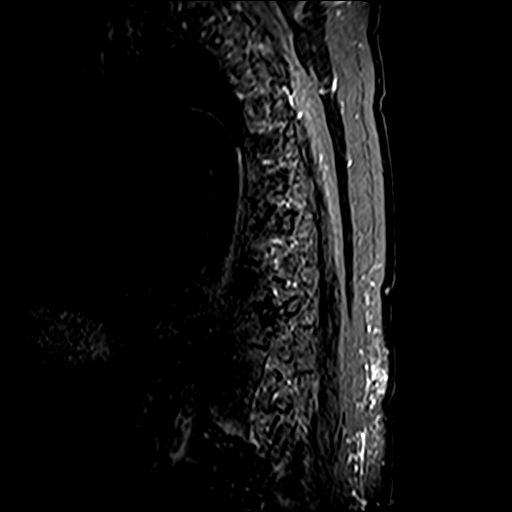
[im 4/17]
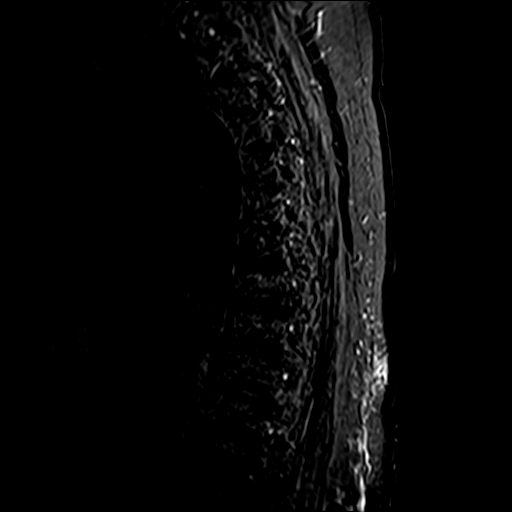
[im 7/17]
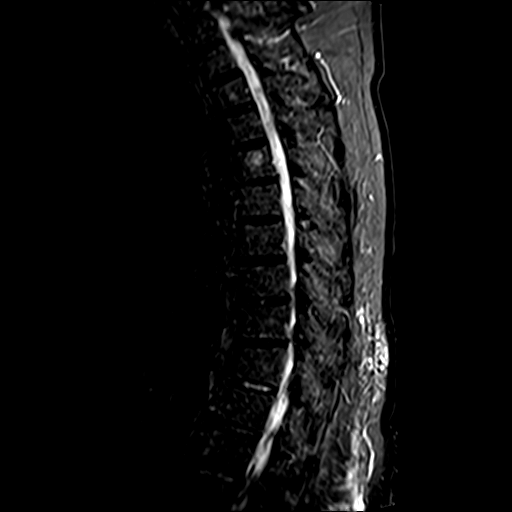
[im 10/17]
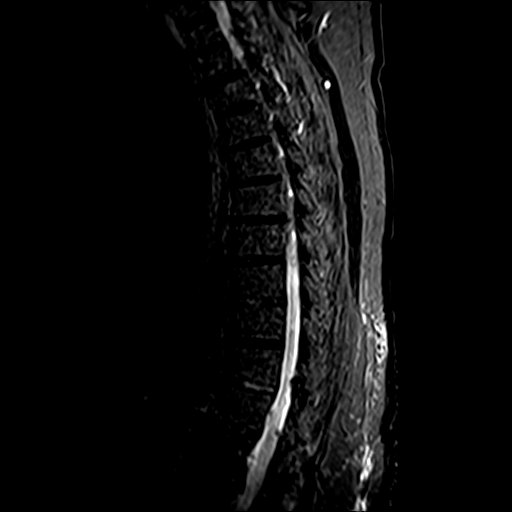
[im 13/17]
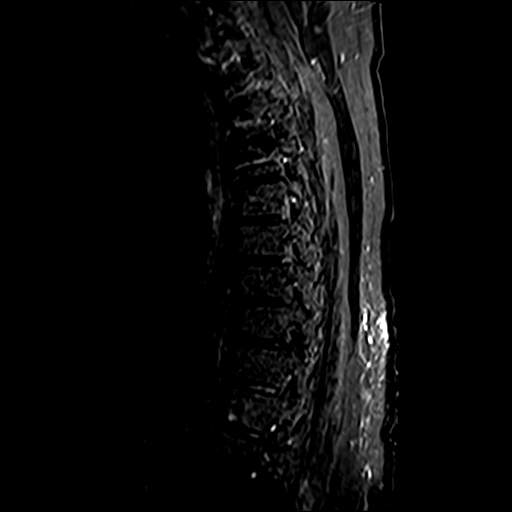
[im 17/17]
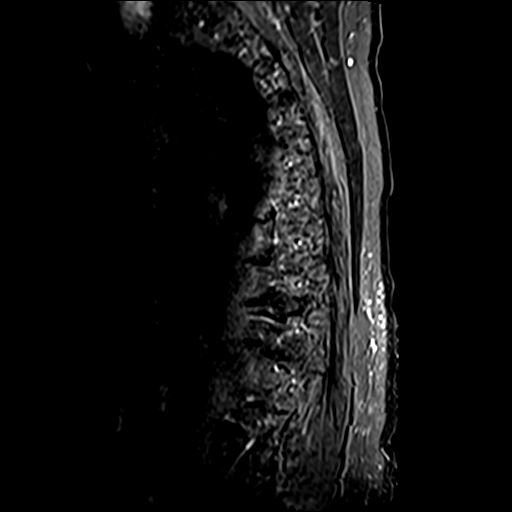

[Series 22: T2 · axial · 4.0mm · 0.59mm/px · z∈[-323,-68]mm · 8 of 39 slices shown (2 of 2)]
[im 1/39]
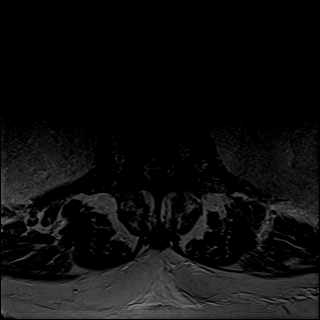
[im 6/39]
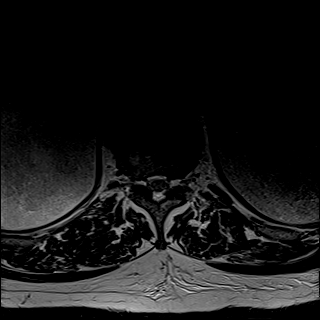
[im 12/39]
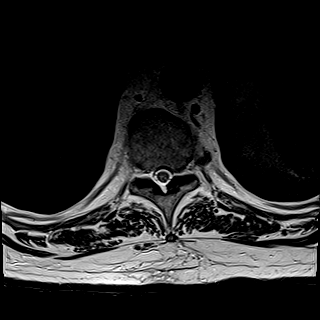
[im 18/39]
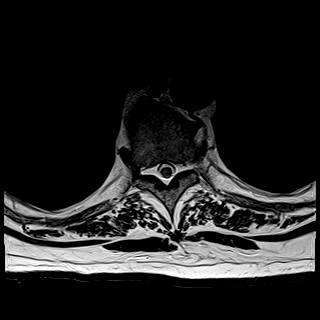
[im 21/39]
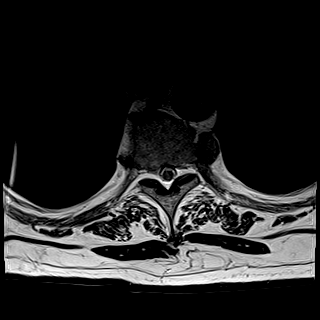
[im 27/39]
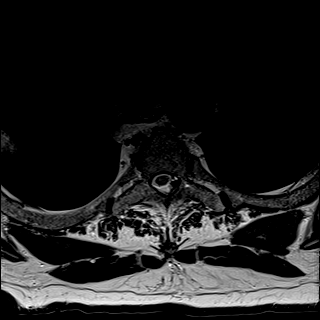
[im 33/39]
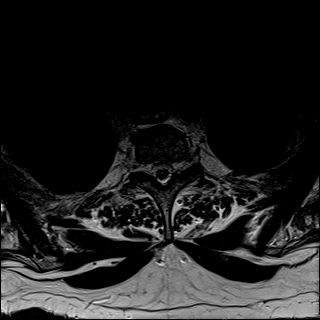
[im 39/39]
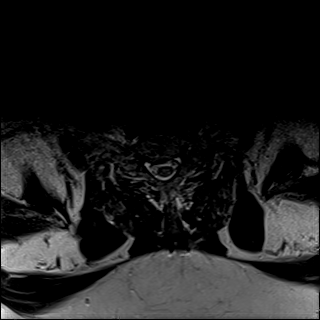

[Series 23: GRE · axial · 4.0mm · 0.37mm/px · z∈[-323,-231]mm · 3 of 39 slices shown]
[im 1/39]
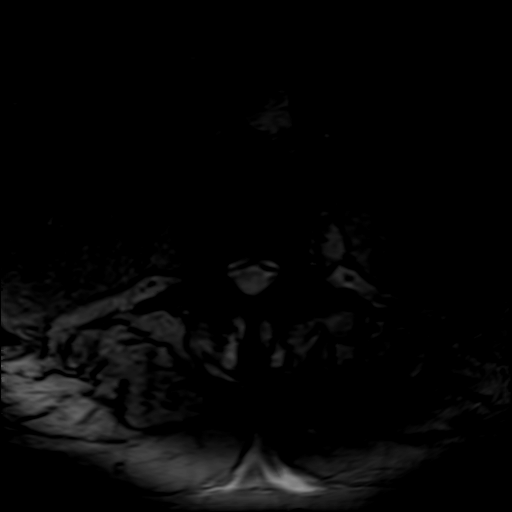
[im 6/39]
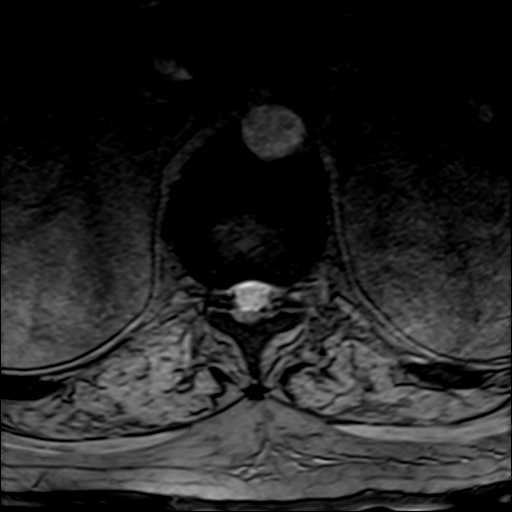
[im 12/39]
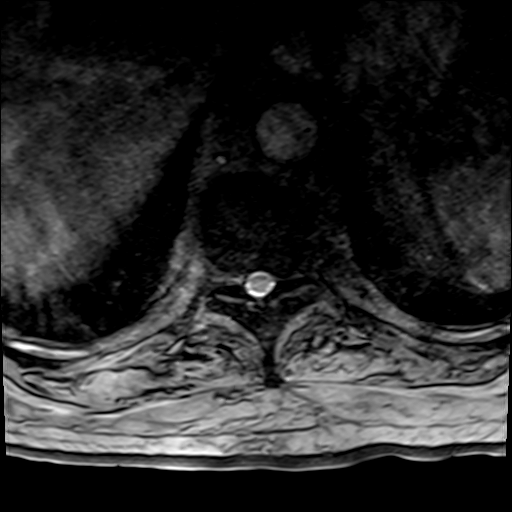

[31 of 48 positions shown; findings below may reference images not displayed]

FINDINGS: MRI CERVICAL SPINE FINDINGS

Alignment: Straightening without focal angulation or significant
listhesis.

Vertebrae: There is an indeterminate well-circumscribed lesion
centrally in the C4 vertebral body which measures 12 mm in diameter.
This demonstrates low T1 and high T2 signal. No other suspicious
osseous lesions. No evidence of acute fracture. There is probable
ankylosis of the facet joints on the right at C2-3 and C3-4.

Cord: The CSF surrounding the cord is partially effaced, but there
is no cord deformity or abnormal cord signal.

Posterior Fossa, vertebral arteries, paraspinal tissues: The
visualized posterior fossa appears unremarkable. There are bilateral
vertebral artery flow voids. No paraspinal abnormalities are seen.

Disc levels:

C2-3: The disc appears normal. The right facet joint appears
ankylosed. No spinal stenosis or nerve root encroachment.

C3-4: Mild disc bulging and uncinate spurring. The right facet joint
appears ankylosed. No cord deformity or nerve root encroachment.

C4-5: Spondylosis with posterior osteophytes covering diffusely
bulging disc material. There is asymmetric uncinate spurring on the
left contributing to effacement of the CSF surrounding the cord.
There is no cord deformity. Moderate foraminal narrowing
bilaterally.

C5-6: Spondylosis with posterior osteophytes covering diffusely
bulging disc material. The CSF surrounding the cord is effaced
without cord deformity. Moderate foraminal narrowing bilaterally.

C6-7: Spondylosis with posterior osteophytes covering diffusely
bulging disc material. The CSF surrounding the cord is effaced
without cord deformity. Mild right and moderate left foraminal
narrowing.

C7-T1: Mild disc bulging and facet hypertrophy. No cord deformity or
significant foraminal compromise.

MRI THORACIC SPINE FINDINGS

Alignment:  Normal.

Vertebrae: No evidence of acute fracture or suspicious marrow
lesion. A lesion centrally in the T5 vertebral body demonstrates
areas of high T1 and T2 signal, most consistent with a hemangioma.

Cord: Normal in signal and caliber. The conus medullaris extends to
the L1 level.

Paraspinal and other soft tissues: No paraspinal abnormalities.

Disc levels:

There is mild disc bulging and endplate osteophyte formation
throughout the thoracic spine. There is spurring at the
costovertebral junctions. No large disc herniation or significant
spinal stenosis demonstrated.
IMPRESSION: 1. No acute findings are identified within the cervicothoracic
spine.
2. There is multilevel spondylosis with disc bulging, endplate
osteophytes and facet hypertrophy as described. The CSF surrounding
the cord is partially effaced from C4-5 through C6-7 without
associated cord deformity or abnormal cord signal. Moderate
foraminal narrowing is present at these levels.
3. No significant spinal stenosis or nerve root encroachment in the
thoracic spine.
4. Indeterminate T2 hyperintense lesion in the C4 vertebral body.
This could reflect a metastasis, but no other suspicious marrow
lesions are identified. Suggest follow-up MRI of the cervical spine
without and with contrast in 3-6 months.

## 2021-03-23 MED ORDER — SODIUM CHLORIDE 0.9 % IV SOLN: INTRAVENOUS | Status: DC

## 2021-03-23 MED ORDER — VANCOMYCIN HCL 1500 MG/300ML IV SOLN
1500.0000 mg | INTRAVENOUS | Status: DC
  Filled 2021-03-23: qty 300

## 2021-03-23 MED ORDER — INSULIN DETEMIR 100 UNIT/ML ~~LOC~~ SOLN
30.0000 [IU] | Freq: Every day | SUBCUTANEOUS | Status: DC
  Administered 2021-03-23 – 2021-03-25 (×3): 30 [IU] via SUBCUTANEOUS
  Filled 2021-03-23 (×4): qty 0.3

## 2021-03-23 MED ORDER — SODIUM CHLORIDE 0.9 % IV SOLN
2.0000 g | Freq: Three times a day (TID) | INTRAVENOUS | Status: DC
  Administered 2021-03-23 – 2021-03-24 (×2): 2 g via INTRAVENOUS
  Filled 2021-03-23 (×5): qty 2

## 2021-03-23 MED ORDER — VANCOMYCIN HCL 10 G IV SOLR
2500.0000 mg | Freq: Once | INTRAVENOUS | Status: AC
  Administered 2021-03-23: 2500 mg via INTRAVENOUS
  Filled 2021-03-23: qty 2500

## 2021-03-23 NOTE — Progress Notes (Signed)
Received a secure chat about patient's temperature of 100.6 F. BP at that time was 98/39, however, repeat was 138/64. With associated leukocytosis, will escalate antibiotics. Discontinue Ceftriaxone and start Vancomycin/Cefepime. Blood/urine cultures already obtained. Obtain procalcitonin and lactic acid. Plan discussed with wife.  Cordelia Poche, MD [facility] 03/23/2021, 4:24 PM

## 2021-03-23 NOTE — Progress Notes (Addendum)
PROGRESS NOTE    Norman Palmer  TMH:[id number] DOB: Mar 19, 1941 DOA: 03/17/2021 PCP: Birdie Sons, MD   Brief Narrative: Norman Palmer is a 80 y.o. male with history of dementia, diabetes, hypothyroidism, basal cell carcinoma of the skin.  Patient resented secondary to agitation and weakness and was found to have an acute UTI.  Patient was started on empiric antibiotics.  Hospitalization complicated by delirium in setting of underlying dementia in addition to acute urinary retention.    Assessment & Plan:   Principal Problem:   AMS (altered mental status) Active Problems:   BPH with obstruction/lower urinary tract symptoms   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[place])   Essential hypertension   Hyperlipemia   Obstructive sleep apnea   Acute lower UTI   UTI Setting of underlying BPH.  Patient empirically treated with ceftriaxone IV urine culture is significant for E. coli, sensitive to ceftriaxone. -Continue ceftriaxone IV with plan for 7-day treatment course. Leukocytosis worsened -Blood/urine culture  Acute metabolic encephalopathy Delirium Dementia Patient's mental status changes appear to be likely multifactorial in setting of UTI in addition to underlying dementia.  Patient is on risperidone as an outpatient which was initially restarted and now discontinued. -Delirium precautions -Continue home Aricept, Zoloft  Weakness Generalized in setting of infection. Also has some focal RLE weakness greater than left. History of lumbar spine pathology. No other focal findings. Now with urinary retention, but this is in setting of known urethral stricture. MRI brain without stroke. Lumbar spine with significant lumbar pathology but nothing acute. -Neurology recommendations: neurosurgery consultation -Neurosurgery recommendations: MRI cervical/thoracic spine; conservative management at this time  Diabetes mellitus, type II Peripheral neuropathy Patient is on NPH 30  units every evening in addition to glipizide and Metformin as an outpatient.  Patient started on sliding scale insulin only while inpatient -Continue sliding scale insulin -Increase to Levemir 30 units nightly  Acute urinary retention Patient with a history of BPH and urethral stricture.  Complicated by UTI.  Bladder scan performed by nursing and was significant for a postvoid residual of 631 mL. Urology consulted and foley catheter was placed with recommendations for outpatient voiding trial in 1 week versus if at SNF, removal of foley at that time.  Hyperlipidemia -Continue simvastatin and dutasteride  BPH -Continue Flomax   DVT prophylaxis: Lovenox Code Status:   Code Status: Full Code Family Communication: Wife and son at bedside Disposition Plan: Medically stable for discharge pending neurology/neurosurgery final recommendations. SNF can accept patient no earlier than in two days    Consultants:   Urology  Neurology  Procedures:   None  Antimicrobials:  Ceftriaxone IV   Subjective: Affect is unchanged. Weakness persists.  Objective: Vitals:   03/22/21 2326 03/23/21 0438 03/23/21 0756 03/23/21 1206  BP: (!) 170/58 (!) 177/64 (!) 171/61 (!) 138/51  Pulse: 83 81 73 75  Resp: (!) 22 20 (!) 24 (!) 22  Temp: 98.9 F (37.2 C) 98 F (36.7 C) 99.1 F (37.3 C) 99.4 F (37.4 C)  TempSrc: Oral Oral Oral Oral  SpO2: 95% 95% 94% 96%  Weight:      Height:        Intake/Output Summary (Last 24 hours) at 03/23/2021 1314 Last data filed at 03/23/2021 1200 Gross per 24 hour  Intake 474.87 ml  Output 800 ml  Net -325.13 ml   Filed Weights   03/17/21 1807  Weight: 129.7 kg    Examination:  General exam: Appears calm and comfortable  Respiratory  system: Clear to auscultation. Respiratory effort normal. Cardiovascular system: S1 & S2 heard, RRR. No murmurs, rubs, gallops or clicks. Gastrointestinal system: Abdomen is nondistended, soft and nontender. No organomegaly  or masses felt. Normal bowel sounds heard. Central nervous system: Alert and oriented to person, place and month/year. 4/5 UE strength. Musculoskeletal: No edema. No calf tenderness Skin: No cyanosis. No rashes Psychiatry: Flat affect  Data Reviewed: I have personally reviewed following labs and imaging studies  CBC Lab Results  Component Value Date   WBC 17.0 (H) 03/23/2021   RBC 3.63 (L) 03/23/2021   HGB 11.0 (L) 03/23/2021   HCT 33.1 (L) 03/23/2021   MCV 91.2 03/23/2021   MCH 30.3 03/23/2021   PLT 333 03/23/2021   MCHC 33.2 03/23/2021   RDW 11.9 03/23/2021   LYMPHSABS 2.2 03/17/2021   MONOABS 1.5 (H) 03/17/2021   EOSABS 0.1 03/17/2021   BASOSABS 0.1 53/66/4403     Last metabolic panel Lab Results  Component Value Date   NA 129 (L) 03/23/2021   K 4.5 03/23/2021   CL 98 03/23/2021   CO2 22 03/23/2021   BUN 51 (H) 03/23/2021   CREATININE 1.36 (H) 03/23/2021   GLUCOSE 214 (H) 03/23/2021   GFRNONAA 54 (L) 03/23/2021   GFRAA 43 (L) 11/26/2019   CALCIUM 8.4 (L) 03/23/2021   PROT 6.3 (L) 03/18/2021   ALBUMIN 3.4 (L) 03/18/2021   BILITOT 0.5 03/18/2021   ALKPHOS 46 03/18/2021   AST 18 03/18/2021   ALT 13 03/18/2021   ANIONGAP 9 03/23/2021    CBG (last 3)  Recent Labs    03/22/21 2107 03/23/21 0727 03/23/21 1206  GLUCAP 224* 202* 216*     GFR: Estimated Creatinine Clearance: 63.3 mL/min (A) (by C-G formula based on SCr of 1.36 mg/dL (H)).  Coagulation Profile: No results for input(s): INR, PROTIME in the last 168 hours.  Recent Results (from the past 240 hour(s))  Urine culture     Status: Abnormal   Collection Time: 03/17/21  7:42 PM   Specimen: Urine, Random  Result Value Ref Range Status   Specimen Description   Final    URINE, RANDOM Performed at [facility], [address] [place], [place] [address]    Special Requests   Final    NONE Performed at [facility], [place], [place] [address]    Culture  >=100,000 COLONIES/mL ESCHERICHIA COLI (A)  Final   Report Status 03/20/2021 FINAL  Final   Organism ID, Bacteria ESCHERICHIA COLI (A)  Final      Susceptibility   Escherichia coli - MIC*    AMPICILLIN >=32 RESISTANT Resistant     CEFAZOLIN >=64 RESISTANT Resistant     CEFEPIME <=0.12 SENSITIVE Sensitive     CEFTRIAXONE <=0.25 SENSITIVE Sensitive     CIPROFLOXACIN >=4 RESISTANT Resistant     GENTAMICIN <=1 SENSITIVE Sensitive     IMIPENEM <=0.25 SENSITIVE Sensitive     NITROFURANTOIN <=16 SENSITIVE Sensitive     TRIMETH/SULFA <=20 SENSITIVE Sensitive     AMPICILLIN/SULBACTAM >=32 RESISTANT Resistant     PIP/TAZO <=4 SENSITIVE Sensitive     * >=100,000 COLONIES/mL ESCHERICHIA COLI  Resp Panel by RT-PCR (Flu A&B, Covid) Nasopharyngeal Swab     Status: None   Collection Time: 03/17/21  8:49 PM   Specimen: Nasopharyngeal Swab; Nasopharyngeal(NP) swabs in vial transport medium  Result Value Ref Range Status   SARS Coronavirus 2 by RT PCR NEGATIVE NEGATIVE Final    Comment: (NOTE)  SARS-CoV-2 target nucleic acids are NOT DETECTED.  The SARS-CoV-2 RNA is generally detectable in upper respiratory specimens during the acute phase of infection. The lowest concentration of SARS-CoV-2 viral copies this assay can detect is 138 copies/mL. A negative result does not preclude SARS-Cov-2 infection and should not be used as the sole basis for treatment or other patient management decisions. A negative result may occur with  improper specimen collection/handling, submission of specimen other than nasopharyngeal swab, presence of viral mutation(s) within the areas targeted by this assay, and inadequate number of viral copies(<138 copies/mL). A negative result must be combined with clinical observations, patient history, and epidemiological information. The expected result is Negative.  Fact Sheet for Patients:  EntrepreneurPulse.com.au  Fact Sheet for Healthcare Providers:   IncredibleEmployment.be  This test is no t yet approved or cleared by the [place] FDA and  has been authorized for detection and/or diagnosis of SARS-CoV-2 by FDA under an Emergency Use Authorization (EUA). This EUA will remain  in effect (meaning this test can be used) for the duration of the COVID-19 declaration under Section 564(b)(1) of the Act, 21 U.S.C.section 360bbb-3(b)(1), unless the authorization is terminated  or revoked sooner.       Influenza A by PCR NEGATIVE NEGATIVE Final   Influenza B by PCR NEGATIVE NEGATIVE Final    Comment: (NOTE) The Xpert Xpress SARS-CoV-2/FLU/RSV plus assay is intended as an aid in the diagnosis of influenza from Nasopharyngeal swab specimens and should not be used as a sole basis for treatment. Nasal washings and aspirates are unacceptable for Xpert Xpress SARS-CoV-2/FLU/RSV testing.  Fact Sheet for Patients: EntrepreneurPulse.com.au  Fact Sheet for Healthcare Providers: IncredibleEmployment.be  This test is not yet approved or cleared by the [place] FDA and has been authorized for detection and/or diagnosis of SARS-CoV-2 by FDA under an Emergency Use Authorization (EUA). This EUA will remain in effect (meaning this test can be used) for the duration of the COVID-19 declaration under Section 564(b)(1) of the Act, 21 U.S.C. section 360bbb-3(b)(1), unless the authorization is terminated or revoked.  Performed at [facility], [address], [place], [place] [address]         Radiology Studies: No results found.      Scheduled Meds: . Chlorhexidine Gluconate Cloth  6 each Topical Daily  . donepezil  10 mg Oral QHS  . dutasteride  0.5 mg Oral Daily  . enoxaparin (LOVENOX) injection  0.5 mg/kg Subcutaneous Q24H  . insulin aspart  0-15 Units Subcutaneous TID WC  . insulin aspart  0-5 Units Subcutaneous QHS  . insulin detemir  20 Units Subcutaneous  QHS  . lisinopril  20 mg Oral Daily  . sertraline  50 mg Oral Daily  . simvastatin  40 mg Oral QPM  . tamsulosin  0.4 mg Oral Daily  . triamterene-hydrochlorothiazide  1 tablet Oral Daily   Continuous Infusions: . sodium chloride Stopped (03/22/21 2240)  . cefTRIAXone (ROCEPHIN)  IV Stopped (03/22/21 2041)     LOS: 6 days     Cordelia Poche, MD [facility] 03/23/2021, 1:14 PM  If 7PM-7AM, please contact night-coverage [email or web address]

## 2021-03-23 NOTE — Progress Notes (Signed)
Neurology Progress Note  Patient ID: 80 y.o. male with altered mental status and progressive weakness in lower extremities over last 3 weeks. Found to have UTI on ceftriaxone. 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  Interval events: - Seen by NSU this morning who felt he may benefit from L3/4 decompression but as outpatient after medical optimization. Recommended c and t spine MRI which I ordered. - Pt lethargic this afternoon, febrile to 38.1 with WBC 11-->17 - Ceftriaxone changed to cefepime and vanc for suspected impending sepsis  Exam: Vitals:   03/23/21 1611 03/23/21 1619  BP: (!) 98/39 138/64  Pulse: 75 79  Resp: 18   Temp: (!) 100.6 F (38.1 C)   SpO2: 96%    Gen: In bed, NAD Resp: mild tachypnea Abd: soft, nt  Neuro:  MS: Asleep but arousable to voice *CN:    I: Deferred   II,III: PERRLA, VFF by confrontation   III,IV,VI: EOMI w/o nystagmus, no ptosis   V: Sensation intact from V1 to V3 to LT   VII: Eyelid closure was full.Smile symmetric.   VIII: Hearing intact to voice   IX,X: Voice normal, palate elevates symmetrically    XI: SCM/trap 5/5 bilat   XII: Tongue protrudes midline, no atrophy or fasciculations   *Motor: Normal bulk.  No resting tremor, rigidity or bradykinesia. + postural, action tremors BUE. RUE 5/5 LUE 4+/5. Barely anti-gravity bilateral HF, at least 4/5 in all other muscle groups BLE, exam limited 2/2 body habitus and participation. *Sensory: SILT, patient unable to comprehend more advanced sensory testing *Coordination:action tremor bilat FNF *Reflexes:2+ and symmetric BUE, 1+ bilat patellae, absent bilat achilles, toes mute *Gait: deferred  MRI 03/31 Lumbar spine:   No acute abnormality.  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  Severe central canal and bilateral subarticular recess narrowing at L3-4.  Moderately severe central canal and bilateral subarticular recess narrowing L2-3. Mild to  moderate foraminal narrowing at L2-3 is worse on the left.  CNS imaging personally reviewed.  Impression:  80 y.o. male with altered mental status and progressive weakness in lower extremities over last 3 weeks. Found to have UTI on ceftriaxone. 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 4/2 became febrile with leukocytosis and abx escalated to vanc/cefepime  Recommendations: 1) Seen by NSU this morning who felt he may benefit from L3/4 decompression but as outpatient after medical optimization. Recommended c and t spine MRI which I ordered. 2) Prioritize sepsis w/u and tx per hospitalist team. UA/UCx, cxr, blood cultures x2 pending.   Will continue to follow.  Su Monks, MD [facility] [phone]  If 7pm- 7am, please page neurology on call as listed in [place].
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
## 2021-03-23 NOTE — Progress Notes (Signed)
Pharmacy Antibiotic Note  Norman Palmer is a 80 y.o. male with history of dementia, diabetes, hypothyroidism, basal cell carcinoma of the skin. Patient presented 03/17/21 secondary to agitation and weakness and was found to have an acute UTI. Urine culture from 3/27 with E coli for which pt received ceftriaxone. On 4/2 pt found to have temp 100.6, BP 98/39 with associated leukocytosis (WBC 11.9>17); antibiotics were escalated. Blood cultures obtained. Pharmacy has been consulted for vancomycin and cefepime dosing for sepsis.  Plan: Start Cefepime 2g IV q8h Start vancomycin 2500mg  IV x1 loading dose followed by 1500mg  IV q24h Est AUC 505 Est pk 35 Est tr 12  Monitor renal function and adjust dose as clinically indicated Obtain vancomycin levels prior to 4th or 5th dose if vancomycin continued   Height: 6' (182.9 cm) Weight: 129.7 kg (286 lb) IBW/kg (Calculated) : 77.6  Temp (24hrs), Avg:99.2 F (37.3 C), Min:98 F (36.7 C), Max:100.6 F (38.1 C)  Recent Labs  Lab 03/17/21 1806 03/18/21 0504 03/19/21 0420 03/20/21 1002 03/23/21 0529  WBC 15.5* 11.6* 11.9*  --  17.0*  CREATININE 1.79* 1.76* 1.46* 1.45* 1.36*    Estimated Creatinine Clearance: 63.3 mL/min (A) (by C-G formula based on SCr of 1.36 mg/dL (H)).    Allergies  Allergen Reactions  . Doxycycline Rash  . Zinc Rash    Antimicrobials this admission: 3/27 ceftriaxone >> 4/1 4/2 cefepime >> 4/2 vancomycin >>   Microbiology results: 4/2 BCx: pending 3/27 UCx: E coli  Thank you for allowing pharmacy to be a part of this patient's care.  Sherilyn Banker, PharmD Pharmacy Resident  03/23/2021 4:30 PM

## 2021-03-24 DIAGNOSIS — N390 Urinary tract infection, site not specified: Secondary | ICD-10-CM | POA: Diagnosis not present

## 2021-03-24 DIAGNOSIS — M48061 Spinal stenosis, lumbar region without neurogenic claudication: Secondary | ICD-10-CM | POA: Diagnosis not present

## 2021-03-24 DIAGNOSIS — E119 Type 2 diabetes mellitus without complications: Secondary | ICD-10-CM | POA: Diagnosis not present

## 2021-03-24 DIAGNOSIS — R410 Disorientation, unspecified: Secondary | ICD-10-CM | POA: Diagnosis not present

## 2021-03-24 DIAGNOSIS — I10 Essential (primary) hypertension: Secondary | ICD-10-CM | POA: Diagnosis not present

## 2021-03-24 DIAGNOSIS — N401 Enlarged prostate with lower urinary tract symptoms: Secondary | ICD-10-CM | POA: Diagnosis not present

## 2021-03-24 LAB — CBC
HCT: 31.7 % — ABNORMAL LOW (ref 39.0–52.0)
Hemoglobin: 10.2 g/dL — ABNORMAL LOW (ref 13.0–17.0)
MCH: 29.7 pg (ref 26.0–34.0)
MCHC: 32.2 g/dL (ref 30.0–36.0)
MCV: 92.4 fL (ref 80.0–100.0)
Platelets: 318 10*3/uL (ref 150–400)
RBC: 3.43 MIL/uL — ABNORMAL LOW (ref 4.22–5.81)
RDW: 12 % (ref 11.5–15.5)
WBC: 17.1 10*3/uL — ABNORMAL HIGH (ref 4.0–10.5)
nRBC: 0 % (ref 0.0–0.2)

## 2021-03-24 LAB — COMPREHENSIVE METABOLIC PANEL
ALT: 121 U/L — ABNORMAL HIGH (ref 0–44)
AST: 197 U/L — ABNORMAL HIGH (ref 15–41)
Albumin: 2.7 g/dL — ABNORMAL LOW (ref 3.5–5.0)
Alkaline Phosphatase: 60 U/L (ref 38–126)
Anion gap: 9 (ref 5–15)
BUN: 65 mg/dL — ABNORMAL HIGH (ref 8–23)
CO2: 21 mmol/L — ABNORMAL LOW (ref 22–32)
Calcium: 7.9 mg/dL — ABNORMAL LOW (ref 8.9–10.3)
Chloride: 98 mmol/L (ref 98–111)
Creatinine, Ser: 1.61 mg/dL — ABNORMAL HIGH (ref 0.61–1.24)
GFR, Estimated: 44 mL/min — ABNORMAL LOW (ref >60)
Glucose, Bld: 222 mg/dL — ABNORMAL HIGH (ref 70–99)
Potassium: 4.7 mmol/L (ref 3.5–5.1)
Sodium: 128 mmol/L — ABNORMAL LOW (ref 135–145)
Total Bilirubin: 0.6 mg/dL (ref 0.3–1.2)
Total Protein: 6.2 g/dL — ABNORMAL LOW (ref 6.5–8.1)

## 2021-03-24 LAB — GLUCOSE, CAPILLARY
Glucose-Capillary: 216 mg/dL — ABNORMAL HIGH (ref 70–99)
Glucose-Capillary: 266 mg/dL — ABNORMAL HIGH (ref 70–99)
Glucose-Capillary: 227 mg/dL — ABNORMAL HIGH (ref 70–99)
Glucose-Capillary: 220 mg/dL — ABNORMAL HIGH (ref 70–99)

## 2021-03-24 LAB — PROCALCITONIN: Procalcitonin: 0.26 ng/mL

## 2021-03-24 LAB — OSMOLALITY, URINE: Osmolality, Ur: 424 mOsm/kg (ref 300–900)

## 2021-03-24 LAB — SODIUM, URINE, RANDOM: Sodium, Ur: 21 mmol/L

## 2021-03-24 MED ORDER — VANCOMYCIN HCL 1250 MG/250ML IV SOLN
1250.0000 mg | INTRAVENOUS | Status: DC
  Administered 2021-03-24 – 2021-03-25 (×2): 1250 mg via INTRAVENOUS
  Filled 2021-03-24 (×4): qty 250

## 2021-03-24 MED ORDER — SODIUM CHLORIDE 0.9 % IV SOLN
2.0000 g | Freq: Two times a day (BID) | INTRAVENOUS | Status: DC
  Administered 2021-03-24 – 2021-03-26 (×4): 2 g via INTRAVENOUS
  Filled 2021-03-24 (×5): qty 2

## 2021-03-24 MED ORDER — INSULIN ASPART 100 UNIT/ML ~~LOC~~ SOLN
0.0000 [IU] | Freq: Three times a day (TID) | SUBCUTANEOUS | Status: DC
  Administered 2021-03-24: 7 [IU] via SUBCUTANEOUS
  Administered 2021-03-25: 4 [IU] via SUBCUTANEOUS
  Administered 2021-03-25: 7 [IU] via SUBCUTANEOUS
  Administered 2021-03-25 – 2021-03-26 (×2): 4 [IU] via SUBCUTANEOUS
  Administered 2021-03-26: 11 [IU] via SUBCUTANEOUS
  Filled 2021-03-24 (×6): qty 1

## 2021-03-24 NOTE — TOC Progression Note (Signed)
Transition of Care [facility]) - Progression Note    Patient Details  Name: Norman Palmer MRN: [id number] Date of Birth: 01/20/1941  Transition of Care [facility]) CM/SW [place], LCSW Phone Number: 03/24/2021, 11:26 AM  Clinical Narrative:       Asked MD and RN for COVID test, plan for SNF tomorrow.     Expected Discharge Plan: [place] Barriers to Discharge: Continued Medical Work up  Expected Discharge Plan and Services Expected Discharge Plan: [place] Choice: [place] arrangements for the past 2 months: Single Family Home                                       Social Determinants of Health (SDOH) Interventions    Readmission Risk Interventions No flowsheet data found.

## 2021-03-24 NOTE — Progress Notes (Signed)
Neurology Progress Note  Patient ID: 80 y.o. male with altered mental status and progressive weakness in lower extremities over last 3 weeks. Found to have UTI on ceftriaxone. 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  Interval events: - Patient reports no complaints today, slightly more alert after antibiotic escalation yesterday to vanc/cefepime 2/2 fever and leukocytosis  Interval data:  MRI c and t spine wo contrast  1. No acute findings are identified within the cervicothoracic spine. 2. 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 The CSF surrounding the cord is partially effaced from C4-5 through C6-7 without associated cord deformity or abnormal cord signal. Moderate foraminal narrowing is present at these levels. 3. No significant spinal stenosis or nerve root encroachment in the thoracic spine. 4. Indeterminate T2 hyperintense lesion in the C4 vertebral body. This could reflect a metastasis, but no other suspicious marrow lesions are identified. Suggest follow-up MRI of the cervical spine without and with contrast in 3-6 months.  CNS imaging personally reviewed; agree with above interpretation  Exam: Vitals:   03/24/21 0757 03/24/21 1141  BP: (!) 122/39 (!) 125/43  Pulse: 79 81  Resp: 18 18  Temp: 99.6 F (37.6 C) 98.1 F (36.7 C)  SpO2: 94% 95%   Gen: In bed, NAD Resp: mild tachypnea Abd: soft, nt  Neuro:  MS: Asleep but arousable to voice *CN:    I: Deferred   II,III: PERRLA, VFF by confrontation   III,IV,VI: EOMI w/o nystagmus, no ptosis   V: Sensation intact from V1 to V3 to LT   VII: Eyelid closure was full.Smile symmetric.   VIII: Hearing intact to voice   IX,X: Voice normal, palate elevates symmetrically    XI: SCM/trap 5/5 bilat   XII: Tongue protrudes midline, no atrophy or fasciculations   *Motor: Normal bulk.  No resting tremor, rigidity or bradykinesia. + postural, action  tremors BUE. RUE 5/5 LUE 4+/5. Barely anti-gravity bilateral HF, at least 4/5 in all other muscle groups BLE, exam limited 2/2 body habitus and participation. *Sensory: SILT, patient unable to comprehend more advanced sensory testing *Coordination:action tremor bilat FNF *Reflexes:2+ and symmetric BUE, 1+ bilat patellae, absent bilat achilles, toes mute *Gait: deferred  MRI 03/31 Lumbar spine:  No acute abnormality.  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  Severe central canal and bilateral subarticular recess narrowing at L3-4.  Moderately severe central canal and bilateral subarticular recess narrowing L2-3. Mild to moderate foraminal narrowing at L2-3 is worse on the left.  CNS imaging personally reviewed.  Impression:  80 y.o. male with altered mental status and progressive weakness in lower extremities over last 3 weeks. Found to have UTI on ceftriaxone. 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 4/2 became febrile with leukocytosis and abx escalated to vanc/cefepime  Recommendations: 1) Seen by NSU this morning who felt he may benefit from L3/4 decompression but as outpatient after medical optimization.  2) Prioritize infectious w/u and tx per hospitalist team. UA/UCx, blood cultures x2 pending. 3) Nonspecific T2 hyperintense lesion in the C4 vertebral body - f/u MRI c spine with and without contrast in 3 mos (include in discharge summary)  Will continue to follow.  Su Monks, MD [facility] [phone]  If 7pm- 7am, please page neurology on call as listed in [place].

## 2021-03-24 NOTE — Plan of Care (Signed)
  Problem: Clinical Measurements: Goal: Ability to maintain clinical measurements within normal limits will improve Outcome: Progressing Goal: Will remain free from infection Outcome: Progressing Goal: Diagnostic test results will improve Outcome: Progressing Goal: Respiratory complications will improve Outcome: Progressing Goal: Cardiovascular complication will be avoided Outcome: Progressing   Problem: Elimination: Goal: Will not experience complications related to bowel motility Outcome: Progressing   Problem: Pain Managment: Goal: General experience of comfort will improve Outcome: Progressing   Pt is alert and orientedx3. Calm and cooperative. V/S stable. On room air with oxygen saturation at 93%. Has pain on the right leg when turning on his side. FC in place; draining well. BM noted 1x.

## 2021-03-24 NOTE — Progress Notes (Signed)
PROGRESS NOTE    Norman Palmer  PHQ:[id number] DOB: [birth year] DOA: 03/17/2021 PCP: Birdie Sons, MD   Brief Narrative: Norman Palmer is a 80 y.o. male with history of dementia, diabetes, hypothyroidism, basal cell carcinoma of the skin.  Patient resented secondary to agitation and weakness and was found to have an acute UTI.  Patient was started on empiric antibiotics.  Hospitalization complicated by delirium in setting of underlying dementia in addition to acute urinary retention.    Assessment & Plan:   Principal Problem:   AMS (altered mental status) Active Problems:   BPH with obstruction/lower urinary tract symptoms   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[place])   Essential hypertension   Hyperlipemia   Obstructive sleep apnea   Acute lower UTI   UTI Setting of underlying BPH.  Patient empirically treated with ceftriaxone IV urine culture is significant for E. coli, sensitive to ceftriaxone. -Antibiotics escalated secondary to increased leukocytosis and mild fever. -Blood/urine culture pending  Acute metabolic encephalopathy Delirium Dementia Patient's mental status changes appear to be likely multifactorial in setting of UTI in addition to underlying dementia.  Patient is on risperidone as an outpatient which was initially restarted and now discontinued. -Delirium precautions -Continue home Aricept, Zoloft  Leukocytosis Unsure of etiology. Tmax of 100.6 F on 4/2. Procalcitonin only mildly elevated. Normal lactic acid. -Blood/urine culture pending -Continue Vancomycin/Cefepime  Weakness Generalized in setting of infection. Also has some focal RLE weakness greater than left. History of lumbar spine pathology. No other focal findings. Now with urinary retention, but this is in setting of known urethral stricture. MRI brain without stroke. Lumbar spine with significant lumbar pathology but nothing acute. -Neurology recommendations: neurosurgery  consultation -Neurosurgery recommendations: conservative management at this time, pending today  Diabetes mellitus, type II Peripheral neuropathy Patient is on NPH 30 units every evening in addition to glipizide and Metformin as an outpatient.  Patient started on sliding scale insulin only while inpatient. Levemir added. Fasting blood sugar elevated. -Increase to resistant SSI -Increase to Levemir 30 units nightly  Acute urinary retention Patient with a history of BPH and urethral stricture.  Complicated by UTI.  Bladder scan performed by nursing and was significant for a postvoid residual of 631 mL. Urology consulted and foley catheter was placed with recommendations for outpatient voiding trial in 1 week versus if at SNF, removal of foley at that time.  Hyperlipidemia -Continue simvastatin and dutasteride  Primary hypertension -Hold triamterene, hydrochlorothiazide, lisinopril secondary to infection. -Continue hydralazine prn  BPH -Continue Flomax   DVT prophylaxis: Lovenox Code Status:   Code Status: Full Code Family Communication: None at bedside. Called wife on telephone but no answer. Disposition Plan: Anticipate discharge to SNF likely in 2-4 days pending workup for possible infection (apart from UTI) and transition to oral antibiotics if needed.    Consultants:   Urology  Neurology  Neurosurgery  Procedures:   None  Antimicrobials:  Ceftriaxone IV  Vancomycin IV  Cefepime   Subjective: Patient reports no issues overnight. Feels well. Eating breakfast.  Objective: Vitals:   03/23/21 2321 03/24/21 0511 03/24/21 0757 03/24/21 1141  BP: (!) 141/66 132/61 (!) 122/39 (!) 125/43  Pulse: 77 88 79 81  Resp:  20 18 18   Temp: 98.8 F (37.1 C) 99.3 F (37.4 C) 99.6 F (37.6 C) 98.1 F (36.7 C)  TempSrc: Oral Oral Oral Oral  SpO2: 92% 93% 94% 95%  Weight:      Height:  Intake/Output Summary (Last 24 hours) at 03/24/2021 1232 Last data filed at  03/24/2021 1044 Gross per 24 hour  Intake 1895.16 ml  Output 1850 ml  Net 45.16 ml   Filed Weights   03/17/21 1807  Weight: 129.7 kg    Examination:  General exam: Appears calm and comfortable Respiratory system: Clear to auscultation. Respiratory effort normal. Cardiovascular system: S1 & S2 heard, RRR. No murmurs, rubs, gallops or clicks. Gastrointestinal system: Abdomen is nondistended, soft and nontender. No organomegaly or masses felt. Normal bowel sounds heard. Central nervous system: Alert and oriented. Looks to wall for reminders of location. Musculoskeletal: No edema. No calf tenderness Skin: No cyanosis. No rashes Psychiatry: Judgement and insight appear normal. Mood & affect appropriate.   Data Reviewed: I have personally reviewed following labs and imaging studies  CBC Lab Results  Component Value Date   WBC 17.1 (H) 03/24/2021   RBC 3.43 (L) 03/24/2021   HGB 10.2 (L) 03/24/2021   HCT 31.7 (L) 03/24/2021   MCV 92.4 03/24/2021   MCH 29.7 03/24/2021   PLT 318 03/24/2021   MCHC 32.2 03/24/2021   RDW 12.0 03/24/2021   LYMPHSABS 2.2 03/17/2021   MONOABS 1.5 (H) 03/17/2021   EOSABS 0.1 03/17/2021   BASOSABS 0.1 38/75/6433     Last metabolic panel Lab Results  Component Value Date   NA 128 (L) 03/24/2021   K 4.7 03/24/2021   CL 98 03/24/2021   CO2 21 (L) 03/24/2021   BUN 65 (H) 03/24/2021   CREATININE 1.61 (H) 03/24/2021   GLUCOSE 222 (H) 03/24/2021   GFRNONAA 44 (L) 03/24/2021   GFRAA 43 (L) 11/26/2019   CALCIUM 7.9 (L) 03/24/2021   PROT 6.2 (L) 03/24/2021   ALBUMIN 2.7 (L) 03/24/2021   BILITOT 0.6 03/24/2021   ALKPHOS 60 03/24/2021   AST 197 (H) 03/24/2021   ALT 121 (H) 03/24/2021   ANIONGAP 9 03/24/2021    CBG (last 3)  Recent Labs    03/23/21 2032 03/24/21 0728 03/24/21 1140  GLUCAP 278* 216* 266*     GFR: Estimated Creatinine Clearance: 53.5 mL/min (A) (by C-G formula based on SCr of 1.61 mg/dL (H)).  Coagulation Profile: No  results for input(s): INR, PROTIME in the last 168 hours.  Recent Results (from the past 240 hour(s))  Urine culture     Status: Abnormal   Collection Time: 03/17/21  7:42 PM   Specimen: Urine, Random  Result Value Ref Range Status   Specimen Description   Final    URINE, RANDOM Performed at [facility], [address] [place], [place] [address]    Special Requests   Final    NONE Performed at [facility], [address] [place], [place] [address]    Culture >=100,000 COLONIES/mL ESCHERICHIA COLI (A)  Final   Report Status 03/20/2021 FINAL  Final   Organism ID, Bacteria ESCHERICHIA COLI (A)  Final      Susceptibility   Escherichia coli - MIC*    AMPICILLIN >=32 RESISTANT Resistant     CEFAZOLIN >=64 RESISTANT Resistant     CEFEPIME <=0.12 SENSITIVE Sensitive     CEFTRIAXONE <=0.25 SENSITIVE Sensitive     CIPROFLOXACIN >=4 RESISTANT Resistant     GENTAMICIN <=1 SENSITIVE Sensitive     IMIPENEM <=0.25 SENSITIVE Sensitive     NITROFURANTOIN <=16 SENSITIVE Sensitive     TRIMETH/SULFA <=20 SENSITIVE Sensitive     AMPICILLIN/SULBACTAM >=32 RESISTANT Resistant     PIP/TAZO <=4 SENSITIVE Sensitive     * >=  100,000 COLONIES/mL ESCHERICHIA COLI  Resp Panel by RT-PCR (Flu A&B, Covid) Nasopharyngeal Swab     Status: None   Collection Time: 03/17/21  8:49 PM   Specimen: Nasopharyngeal Swab; Nasopharyngeal(NP) swabs in vial transport medium  Result Value Ref Range Status   SARS Coronavirus 2 by RT PCR NEGATIVE NEGATIVE Final    Comment: (NOTE) SARS-CoV-2 target nucleic acids are NOT DETECTED.  The SARS-CoV-2 RNA is generally detectable in upper respiratory specimens during the acute phase of infection. The lowest concentration of SARS-CoV-2 viral copies this assay can detect is 138 copies/mL. A negative result does not preclude SARS-Cov-2 infection and should not be used as the sole basis for treatment or other patient management decisions. A negative result may  occur with  improper specimen collection/handling, submission of specimen other than nasopharyngeal swab, presence of viral mutation(s) within the areas targeted by this assay, and inadequate number of viral copies(<138 copies/mL). A negative result must be combined with clinical observations, patient history, and epidemiological information. The expected result is Negative.  Fact Sheet for Patients:  EntrepreneurPulse.com.au  Fact Sheet for Healthcare Providers:  IncredibleEmployment.be  This test is no t yet approved or cleared by the [place] FDA and  has been authorized for detection and/or diagnosis of SARS-CoV-2 by FDA under an Emergency Use Authorization (EUA). This EUA will remain  in effect (meaning this test can be used) for the duration of the COVID-19 declaration under Section 564(b)(1) of the Act, 21 U.S.C.section 360bbb-3(b)(1), unless the authorization is terminated  or revoked sooner.       Influenza A by PCR NEGATIVE NEGATIVE Final   Influenza B by PCR NEGATIVE NEGATIVE Final    Comment: (NOTE) The Xpert Xpress SARS-CoV-2/FLU/RSV plus assay is intended as an aid in the diagnosis of influenza from Nasopharyngeal swab specimens and should not be used as a sole basis for treatment. Nasal washings and aspirates are unacceptable for Xpert Xpress SARS-CoV-2/FLU/RSV testing.  Fact Sheet for Patients: EntrepreneurPulse.com.au  Fact Sheet for Healthcare Providers: IncredibleEmployment.be  This test is not yet approved or cleared by the [place] FDA and has been authorized for detection and/or diagnosis of SARS-CoV-2 by FDA under an Emergency Use Authorization (EUA). This EUA will remain in effect (meaning this test can be used) for the duration of the COVID-19 declaration under Section 564(b)(1) of the Act, 21 U.S.C. section 360bbb-3(b)(1), unless the authorization is terminated  or revoked.  Performed at [facility], [address] [place], [place] [address]   CULTURE, BLOOD (ROUTINE X 2) w Reflex to ID Panel     Status: None (Preliminary result)   Collection Time: 03/23/21  1:47 PM   Specimen: BLOOD  Result Value Ref Range Status   Specimen Description BLOOD RIGHT ANTECUBITAL  Final   Special Requests   Final    BOTTLES DRAWN AEROBIC AND ANAEROBIC Blood Culture adequate volume   Culture   Final    NO GROWTH < 24 HOURS Performed at [facility], [address], [place], [place] [address]    Report Status PENDING  Incomplete  CULTURE, BLOOD (ROUTINE X 2) w Reflex to ID Panel     Status: None (Preliminary result)   Collection Time: 03/23/21  1:54 PM   Specimen: BLOOD  Result Value Ref Range Status   Specimen Description BLOOD LEFT ANTECUBITAL  Final   Special Requests   Final    BOTTLES DRAWN AEROBIC AND ANAEROBIC Blood Culture adequate volume   Culture   Final  NO GROWTH < 24 HOURS Performed at [facility], [place], [place] [address]    Report Status PENDING  Incomplete        Radiology Studies: MR CERVICAL SPINE WO CONTRAST  Result Date: 03/23/2021 CLINICAL DATA:  Agitation and weakness. History of dementia and Parkinson's disease. Multiple falls. Cervical radiculopathy and mid back pain. EXAM: MRI CERVICAL AND THORACIC SPINE WITHOUT CONTRAST TECHNIQUE: Multiplanar and multiecho pulse sequences of the cervical spine, to include the craniocervical junction and cervicothoracic junction, and the thoracic spine, were obtained without intravenous contrast. COMPARISON:  Lumbar MRI 03/21/2021.  CT lumbar spine 03/17/2021. FINDINGS: MRI CERVICAL SPINE FINDINGS Alignment: Straightening without focal angulation or significant listhesis. Vertebrae: There is an indeterminate well-circumscribed lesion centrally in the C4 vertebral body which measures 12 mm in diameter. This demonstrates low T1 and high T2 signal. No  other suspicious osseous lesions. No evidence of acute fracture. There is probable ankylosis of the facet joints on the right at C2-3 and C3-4. Cord: The CSF surrounding the cord is partially effaced, but there is no cord deformity or abnormal cord signal. Posterior Fossa, vertebral arteries, paraspinal tissues: The visualized posterior fossa appears unremarkable. There are bilateral vertebral artery flow voids. No paraspinal abnormalities are seen. Disc levels: C2-3: The disc appears normal. The right facet joint appears ankylosed. No spinal stenosis or nerve root encroachment. C3-4: Mild disc bulging and uncinate spurring. The right facet joint appears ankylosed. No cord deformity or nerve root encroachment. C4-5: Spondylosis with posterior osteophytes covering diffusely bulging disc material. There is asymmetric uncinate spurring on the left contributing to effacement of the CSF surrounding the cord. There is no cord deformity. Moderate foraminal narrowing bilaterally. C5-6: Spondylosis with posterior osteophytes covering diffusely bulging disc material. The CSF surrounding the cord is effaced without cord deformity. Moderate foraminal narrowing bilaterally. C6-7: Spondylosis with posterior osteophytes covering diffusely bulging disc material. The CSF surrounding the cord is effaced without cord deformity. Mild right and moderate left foraminal narrowing. C7-T1: Mild disc bulging and facet hypertrophy. No cord deformity or significant foraminal compromise. MRI THORACIC SPINE FINDINGS Alignment:  Normal. Vertebrae: No evidence of acute fracture or suspicious marrow lesion. A lesion centrally in the T5 vertebral body demonstrates areas of high T1 and T2 signal, most consistent with a hemangioma. Cord: Normal in signal and caliber. The conus medullaris extends to the L1 level. Paraspinal and other soft tissues: No paraspinal abnormalities. Disc levels: There is mild disc bulging and endplate osteophyte formation  throughout the thoracic spine. There is spurring at the costovertebral junctions. No large disc herniation or significant spinal stenosis demonstrated. IMPRESSION: 1. No acute findings are identified within the cervicothoracic spine. 2. 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 The CSF surrounding the cord is partially effaced from C4-5 through C6-7 without associated cord deformity or abnormal cord signal. Moderate foraminal narrowing is present at these levels. 3. No significant spinal stenosis or nerve root encroachment in the thoracic spine. 4. Indeterminate T2 hyperintense lesion in the C4 vertebral body. This could reflect a metastasis, but no other suspicious marrow lesions are identified. Suggest follow-up MRI of the cervical spine without and with contrast in 3-6 months. Electronically Signed   By: Richardean Sale M.D.   On: 03/23/2021 18:08   MR THORACIC SPINE WO CONTRAST  Result Date: 03/23/2021 CLINICAL DATA:  Agitation and weakness. History of dementia and Parkinson's disease. Multiple falls. Cervical radiculopathy and mid back pain. EXAM:  MRI CERVICAL AND THORACIC SPINE WITHOUT CONTRAST TECHNIQUE: Multiplanar and multiecho pulse sequences of the cervical spine, to include the craniocervical junction and cervicothoracic junction, and the thoracic spine, were obtained without intravenous contrast. COMPARISON:  Lumbar MRI 03/21/2021.  CT lumbar spine 03/17/2021. FINDINGS: MRI CERVICAL SPINE FINDINGS Alignment: Straightening without focal angulation or significant listhesis. Vertebrae: There is an indeterminate well-circumscribed lesion centrally in the C4 vertebral body which measures 12 mm in diameter. This demonstrates low T1 and high T2 signal. No other suspicious osseous lesions. No evidence of acute fracture. There is probable ankylosis of the facet joints on the right at C2-3 and C3-4. Cord: The CSF surrounding the cord is  partially effaced, but there is no cord deformity or abnormal cord signal. Posterior Fossa, vertebral arteries, paraspinal tissues: The visualized posterior fossa appears unremarkable. There are bilateral vertebral artery flow voids. No paraspinal abnormalities are seen. Disc levels: C2-3: The disc appears normal. The right facet joint appears ankylosed. No spinal stenosis or nerve root encroachment. C3-4: Mild disc bulging and uncinate spurring. The right facet joint appears ankylosed. No cord deformity or nerve root encroachment. C4-5: Spondylosis with posterior osteophytes covering diffusely bulging disc material. There is asymmetric uncinate spurring on the left contributing to effacement of the CSF surrounding the cord. There is no cord deformity. Moderate foraminal narrowing bilaterally. C5-6: Spondylosis with posterior osteophytes covering diffusely bulging disc material. The CSF surrounding the cord is effaced without cord deformity. Moderate foraminal narrowing bilaterally. C6-7: Spondylosis with posterior osteophytes covering diffusely bulging disc material. The CSF surrounding the cord is effaced without cord deformity. Mild right and moderate left foraminal narrowing. C7-T1: Mild disc bulging and facet hypertrophy. No cord deformity or significant foraminal compromise. MRI THORACIC SPINE FINDINGS Alignment:  Normal. Vertebrae: No evidence of acute fracture or suspicious marrow lesion. A lesion centrally in the T5 vertebral body demonstrates areas of high T1 and T2 signal, most consistent with a hemangioma. Cord: Normal in signal and caliber. The conus medullaris extends to the L1 level. Paraspinal and other soft tissues: No paraspinal abnormalities. Disc levels: There is mild disc bulging and endplate osteophyte formation throughout the thoracic spine. There is spurring at the costovertebral junctions. No large disc herniation or significant spinal stenosis demonstrated. IMPRESSION: 1. No acute findings  are identified within the cervicothoracic spine. 2. 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 The CSF surrounding the cord is partially effaced from C4-5 through C6-7 without associated cord deformity or abnormal cord signal. Moderate foraminal narrowing is present at these levels. 3. No significant spinal stenosis or nerve root encroachment in the thoracic spine. 4. Indeterminate T2 hyperintense lesion in the C4 vertebral body. This could reflect a metastasis, but no other suspicious marrow lesions are identified. Suggest follow-up MRI of the cervical spine without and with contrast in 3-6 months. Electronically Signed   By: Richardean Sale M.D.   On: 03/23/2021 18:08        Scheduled Meds: . Chlorhexidine Gluconate Cloth  6 each Topical Daily  . donepezil  10 mg Oral QHS  . dutasteride  0.5 mg Oral Daily  . enoxaparin (LOVENOX) injection  0.5 mg/kg Subcutaneous Q24H  . insulin aspart  0-15 Units Subcutaneous TID WC  . insulin aspart  0-5 Units Subcutaneous QHS  . insulin detemir  30 Units Subcutaneous QHS  . sertraline  50 mg Oral Daily  . simvastatin  40 mg Oral QPM  . tamsulosin  0.4 mg Oral Daily  Continuous Infusions: . sodium chloride Stopped (03/22/21 2240)  . sodium chloride 100 mL/hr at 03/24/21 1208  . ceFEPime (MAXIPIME) IV    . vancomycin       LOS: 7 days     Cordelia Poche, MD [facility] 03/24/2021, 12:32 PM  If 7PM-7AM, please contact night-coverage [email or web address]

## 2021-03-24 NOTE — Progress Notes (Signed)
Pharmacy Antibiotic Note  Norman Palmer is a 80 y.o. male with history of dementia, diabetes, hypothyroidism, basal cell carcinoma of the skin. Patient presented 03/17/21 secondary to agitation and weakness and was found to have an acute UTI. Urine culture from 3/27 with E coli for which pt received ceftriaxone. On 4/2 pt found to have temp 100.6, BP 98/39 with associated leukocytosis (WBC 11.9>17); antibiotics were escalated. Blood cultures obtained. Pharmacy has been consulted for vancomycin and cefepime dosing for sepsis.  Plan: Continue Cefepime 2g IV q12h  4/2 Received vancomycin 2500mg  IV x1 loading dose   Renal function worsening (Scr 1.36>1.61)   - will adjust follow-up dose to  Vancomycin 1250mg  IV q24h Est AUC 489 Est pk 31.5 Est tr 13  Monitor renal function and adjust dose as clinically indicated Obtain vancomycin levels prior to 4th or 5th dose if vancomycin continued   Height: 6' (182.9 cm) Weight: 129.7 kg (286 lb) IBW/kg (Calculated) : 77.6  Temp (24hrs), Avg:99.2 F (37.3 C), Min:98.1 F (36.7 C), Max:100.6 F (38.1 C)  Recent Labs  Lab 03/17/21 1806 03/18/21 0504 03/19/21 0420 03/20/21 1002 03/23/21 0529 03/23/21 1626 03/23/21 2004 03/24/21 0616  WBC 15.5* 11.6* 11.9*  --  17.0*  --   --  17.1*  CREATININE 1.79* 1.76* 1.46* 1.45* 1.36*  --   --  1.61*  LATICACIDVEN  --   --   --   --   --  1.1 1.1  --     Estimated Creatinine Clearance: 53.5 mL/min (A) (by C-G formula based on SCr of 1.61 mg/dL (H)).    Allergies  Allergen Reactions  . Doxycycline Rash  . Zinc Rash    Antimicrobials this admission: 3/27 ceftriaxone >> 4/1 4/2 cefepime >> 4/2 vancomycin >>   Microbiology results: 4/2 BCx: pending 3/27 UCx: E coli  Thank you for allowing pharmacy to be a part of this patient's care.  Lu Duffel, PharmD, BCPS Clinical Pharmacist 03/24/2021 11:46 AM

## 2021-03-24 NOTE — Progress Notes (Signed)
PHARMACY NOTE:  ANTIMICROBIAL RENAL DOSAGE ADJUSTMENT  Current antimicrobial regimen includes a mismatch between antimicrobial dosage and estimated renal function.  As per policy approved by the Pharmacy & Therapeutics and Medical Executive Committees, the antimicrobial dosage will be adjusted accordingly.  Current antimicrobial dosage:  Cefepime 2 g q8h  Indication: sepsis  Renal Function:  Estimated Creatinine Clearance: 53.5 mL/min (A) (by C-G formula based on SCr of 1.61 mg/dL (H)).  Antimicrobial dosage has been changed to:  Cefepime 2 g q12h    Thank you for allowing pharmacy to be a part of this patient's care.  Tawnya Crook, [facility] 03/24/2021 10:47 AM

## 2021-03-25 ENCOUNTER — Telehealth: Payer: Self-pay | Admitting: Family Medicine

## 2021-03-25 ENCOUNTER — Inpatient Hospital Stay: Payer: Medicare Other

## 2021-03-25 DIAGNOSIS — N401 Enlarged prostate with lower urinary tract symptoms: Secondary | ICD-10-CM | POA: Diagnosis not present

## 2021-03-25 DIAGNOSIS — R4182 Altered mental status, unspecified: Secondary | ICD-10-CM

## 2021-03-25 DIAGNOSIS — R531 Weakness: Secondary | ICD-10-CM | POA: Diagnosis not present

## 2021-03-25 DIAGNOSIS — N390 Urinary tract infection, site not specified: Secondary | ICD-10-CM | POA: Diagnosis not present

## 2021-03-25 DIAGNOSIS — M519 Unspecified thoracic, thoracolumbar and lumbosacral intervertebral disc disorder: Secondary | ICD-10-CM

## 2021-03-25 DIAGNOSIS — E119 Type 2 diabetes mellitus without complications: Secondary | ICD-10-CM | POA: Diagnosis not present

## 2021-03-25 DIAGNOSIS — I10 Essential (primary) hypertension: Secondary | ICD-10-CM | POA: Diagnosis not present

## 2021-03-25 DIAGNOSIS — R410 Disorientation, unspecified: Secondary | ICD-10-CM

## 2021-03-25 LAB — CBC
HCT: 28.7 % — ABNORMAL LOW (ref 39.0–52.0)
Hemoglobin: 9.4 g/dL — ABNORMAL LOW (ref 13.0–17.0)
MCH: 30.1 pg (ref 26.0–34.0)
MCHC: 32.8 g/dL (ref 30.0–36.0)
MCV: 92 fL (ref 80.0–100.0)
Platelets: 331 10*3/uL (ref 150–400)
RBC: 3.12 MIL/uL — ABNORMAL LOW (ref 4.22–5.81)
RDW: 12 % (ref 11.5–15.5)
WBC: 17 10*3/uL — ABNORMAL HIGH (ref 4.0–10.5)
nRBC: 0 % (ref 0.0–0.2)

## 2021-03-25 LAB — COMPREHENSIVE METABOLIC PANEL
ALT: 103 U/L — ABNORMAL HIGH (ref 0–44)
AST: 104 U/L — ABNORMAL HIGH (ref 15–41)
Albumin: 2.5 g/dL — ABNORMAL LOW (ref 3.5–5.0)
Alkaline Phosphatase: 63 U/L (ref 38–126)
Anion gap: 6 (ref 5–15)
BUN: 61 mg/dL — ABNORMAL HIGH (ref 8–23)
CO2: 20 mmol/L — ABNORMAL LOW (ref 22–32)
Calcium: 8 mg/dL — ABNORMAL LOW (ref 8.9–10.3)
Chloride: 102 mmol/L (ref 98–111)
Creatinine, Ser: 1.52 mg/dL — ABNORMAL HIGH (ref 0.61–1.24)
GFR, Estimated: 47 mL/min — ABNORMAL LOW (ref >60)
Glucose, Bld: 175 mg/dL — ABNORMAL HIGH (ref 70–99)
Potassium: 4.4 mmol/L (ref 3.5–5.1)
Sodium: 128 mmol/L — ABNORMAL LOW (ref 135–145)
Total Bilirubin: 0.6 mg/dL (ref 0.3–1.2)
Total Protein: 6 g/dL — ABNORMAL LOW (ref 6.5–8.1)

## 2021-03-25 LAB — SARS CORONAVIRUS 2 (TAT 6-24 HRS): SARS Coronavirus 2: NEGATIVE

## 2021-03-25 LAB — CK: Total CK: 1096 U/L — ABNORMAL HIGH (ref 49–397)

## 2021-03-25 LAB — URINE CULTURE: Culture: NO GROWTH

## 2021-03-25 LAB — GLUCOSE, CAPILLARY
Glucose-Capillary: 177 mg/dL — ABNORMAL HIGH (ref 70–99)
Glucose-Capillary: 220 mg/dL — ABNORMAL HIGH (ref 70–99)
Glucose-Capillary: 200 mg/dL — ABNORMAL HIGH (ref 70–99)
Glucose-Capillary: 196 mg/dL — ABNORMAL HIGH (ref 70–99)

## 2021-03-25 LAB — PROCALCITONIN: Procalcitonin: 0.28 ng/mL

## 2021-03-25 IMAGING — DX DG CHEST 1V PORT
1 series · 1 of 1 positions shown · non-contrast
Comparison: Portable exam [YQ] hours compared to [DATE]

CLINICAL DATA: Leukocytosis, altered mental status, fall, weakness,
diabetes mellitus

EXAM:
PORTABLE CHEST 1 VIEW

[chest ap]
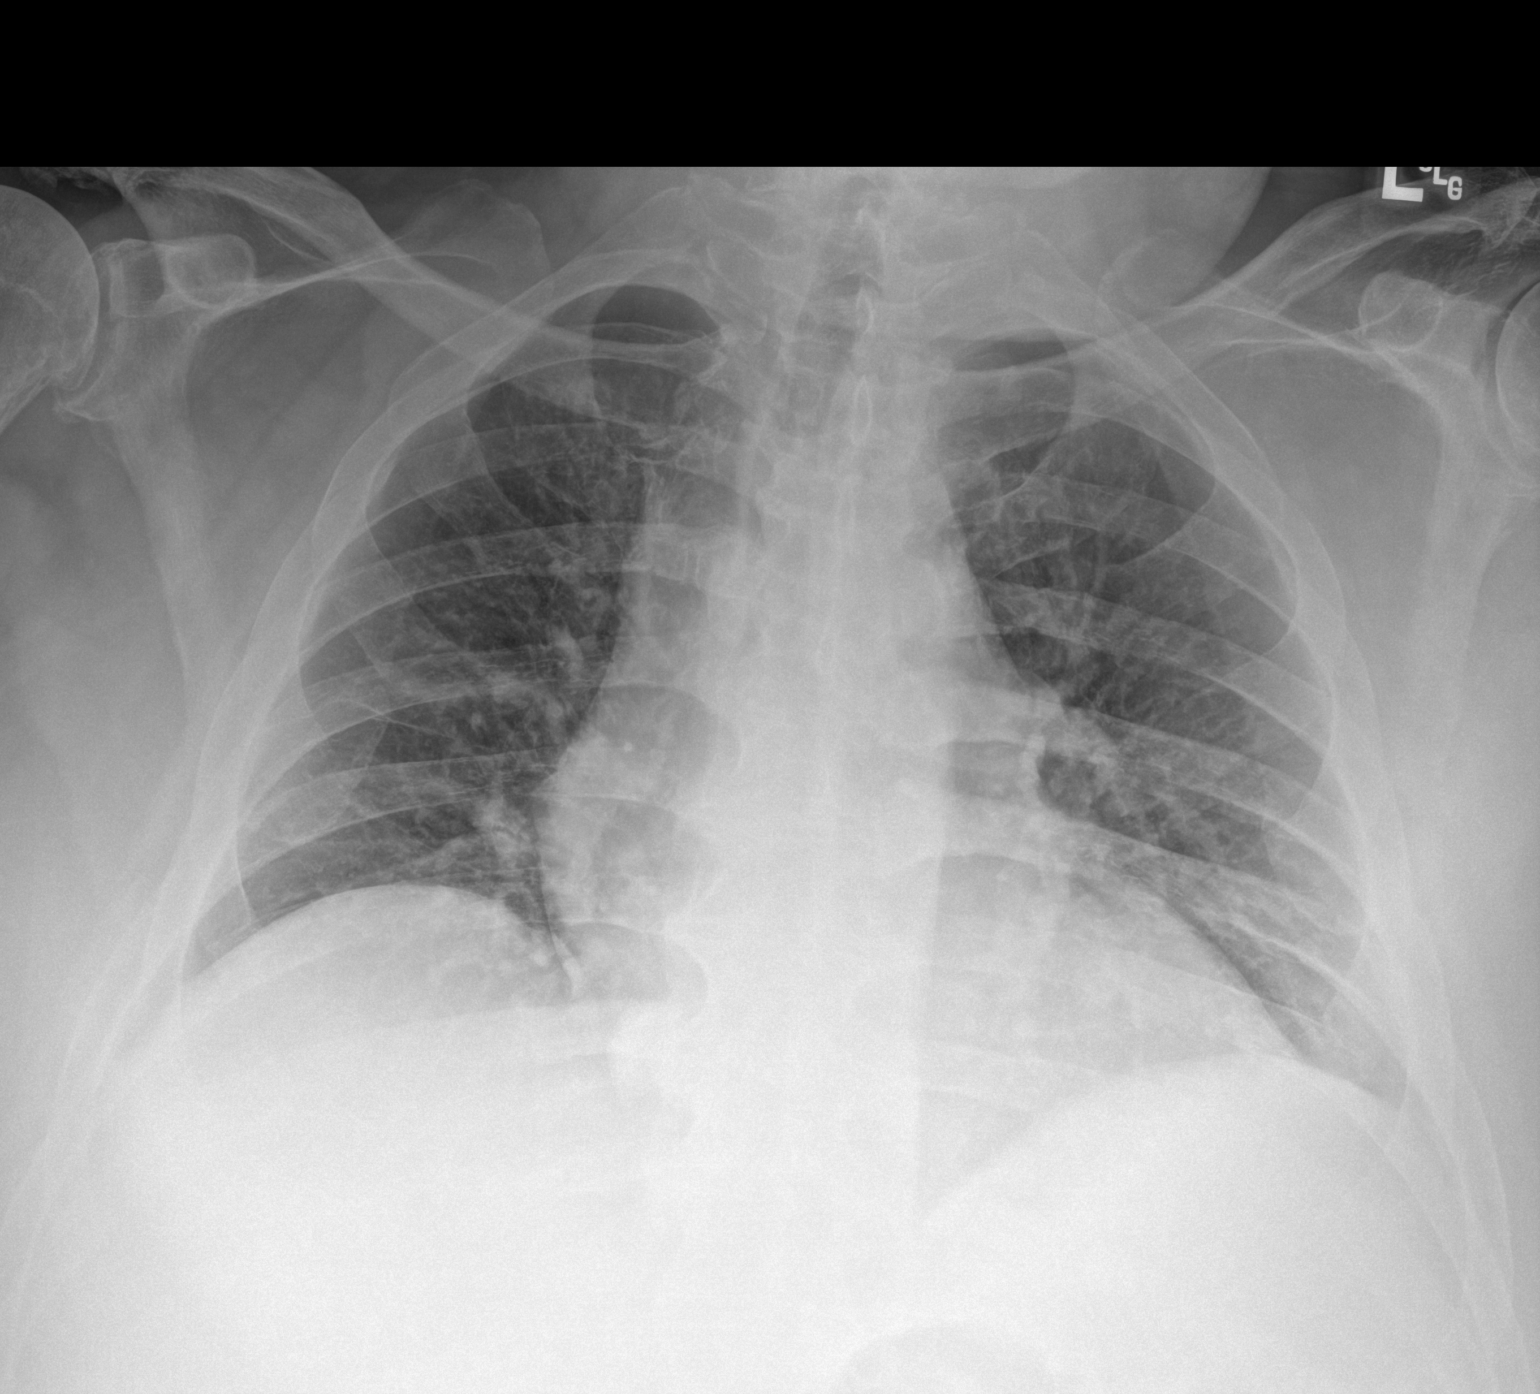

[1 of 1 positions shown; findings below may reference images not displayed]

FINDINGS: Borderline enlargement of cardiac silhouette.

Mediastinal contours and pulmonary vascularity normal.

Lungs clear.

No acute infiltrate, pleural effusion, or pneumothorax.

No acute osseous findings.
IMPRESSION: No acute abnormalities.

## 2021-03-25 MED ORDER — SODIUM CHLORIDE 1 G PO TABS
1.0000 g | ORAL_TABLET | Freq: Three times a day (TID) | ORAL | Status: AC
  Administered 2021-03-25 – 2021-03-26 (×3): 1 g via ORAL
  Filled 2021-03-25 (×3): qty 1

## 2021-03-25 NOTE — Telephone Encounter (Signed)
Mrs. Norman Palmer called and stated that the Pt has been in the hospital since Sunday and he'll be discharged today and going to [place]/ she would like to speak with Dr. Caryn Section to find out the next steps/ she asked if Dr. Caryn Section can look over the hospital notes and test / please advise

## 2021-03-25 NOTE — Progress Notes (Addendum)
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 Found to have a UTI and was started on empiric abx. Hospitalization c/b delirium in setting of underlying dementia. Patient has also had tremulousness of BUE intermittently in the setting of acute illness with both postural and action components. There is no resting tremor. No history of Parkinson's. Patient's family reported that until 3 weeks PTA he was able to stand upright on his own and ambulate with a walker. Over the past 3 weeks PTA his walking had deteriorated and he began to lean forward / hunch while walking and was unable to stand up straight. His legs have gotten progressively weaker over the same time period. On the day of admission 3/27, he was still able to stand with minimal assist; however, on the day of initial Neurology consultation (4/1) he required 2 full person assist with PT and during Neurology assessment later in the day heI was unable to stand at all (w/ 3 RNs helping).   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 Neurosurgery saw the patient and the plan is to coordinate outpatient surgical decompression after patient is optimized from diabetes, infection and metabolic standpoints.     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  Objective: Current vital signs: BP (!) 152/58 (BP Location: Right Arm)   Pulse 68   Temp 98.6 F (37 C)   Resp (!) 24   Ht 6' (1.829 m)   Wt 129.7 kg   SpO2 95%   BMI 38.79 kg/m  Vital signs in last 24 hours: Temp:  [97.8 F (36.6 C)-99.5 F (37.5 C)] 98.6 F (37 C) (04/04 0430) Pulse Rate:  [68-81] 68  (04/04 0430) Resp:  [18-24] 24 (04/04 0430) BP: (125-152)/(43-83) 152/58 (04/04 0430) SpO2:  [93 %-95 %] 95 % (04/04 0430)  Intake/Output from previous day: 04/03 0701 - 04/04 0700 In: 3195.5 [P.O.:960; I.V.:2102.1; IV Piggyback:133.3] Out: 3150 [Urine:3150] Intake/Output this shift: No intake/output data recorded. Nutritional status:  Diet Order            Diet heart healthy/carb modified Room service appropriate? Yes; Fluid consistency: Thin  Diet effective now                HEENT: [place]/AT Lungs: Respirations unlabored Ext: Pitting edema to BLE.  Neurologic Exam: Ment: Awake and alert with impaired memory and concentration. Speech fluent with intact comprehension of basic questions and commands. No agitation noted. Oriented to hospital but not the name of the facility. Knows he is in [place] but does not know the city. States it is March. Does not remember the day of the week. Able to recall the year.  CN: PERRL. EOMI. Face symmetric. Phonation intact.  Motor: 4+/5 BUE strength proximally and distally BLE: States that he cannoit move his BLE but is able to wiggle toes and ankles weakly bilaterally to command. Will flex at the knee and hip with 2/5 strength when withdrawing to noxious plantar stimulation bilaterally. Tone in BLE difficult to assess due to edema.  Sensory: FT intact to BUE and BLE but decreased below knees Reflexes: 2+ bilateral brachioradialis and biceps. 0 bilateral patellae and achilles in the context of prior bilateral knee and ankle surgeries.  Toes mute bilaterally.  Cerebellar: Prominent action and postural tremor to BUE with FNF but no ataxia. Tremor is worse on the left.  Some tremulousness of lower extremities is noted when patient attempts to move legs.    Lab Results: Results for orders placed or performed during the hospital encounter of 03/17/21 (from the past 48 hour(s))  Glucose, capillary     Status: Abnormal   Collection Time: 03/23/21 12:06 PM   Result Value Ref Range   Glucose-Capillary 216 (H) 70 - 99 mg/dL    Comment: Glucose reference range applies only to samples taken after fasting for at least 8 hours.  Urine Culture     Status: None   Collection Time: 03/23/21  1:26 PM   Specimen: Urine, Random  Result Value Ref Range   Specimen Description      URINE, RANDOM Performed at [facility], [address] [place], [place] [address]    Special Requests      NONE Performed at [facility], [address] [place], [place] [address]    Culture      NO GROWTH Performed at [facility], [place] [address], [place], [place] [address]    Report Status 03/25/2021 FINAL   CULTURE, BLOOD (ROUTINE X 2) w Reflex to ID Panel     Status: None (Preliminary result)   Collection Time: 03/23/21  1:47 PM   Specimen: BLOOD  Result Value Ref Range   Specimen Description BLOOD RIGHT ANTECUBITAL    Special Requests      BOTTLES DRAWN AEROBIC AND ANAEROBIC Blood Culture adequate volume   Culture      NO GROWTH 2 DAYS Performed at [facility], [address] [place], [place] [address]    Report Status PENDING   CULTURE, BLOOD (ROUTINE X 2) w Reflex to ID Panel     Status: None (Preliminary result)   Collection Time: 03/23/21  1:54 PM   Specimen: BLOOD  Result Value Ref Range   Specimen Description BLOOD LEFT ANTECUBITAL    Special Requests      BOTTLES DRAWN AEROBIC AND ANAEROBIC Blood Culture adequate volume   Culture      NO GROWTH 2 DAYS Performed at [facility], [address] [place], [place] [address]    Report Status PENDING   Procalcitonin - Baseline     Status: None   Collection Time: 03/23/21  4:26 PM  Result Value Ref Range   Procalcitonin 0.27 ng/mL    Comment:        Interpretation: PCT (Procalcitonin) <= 0.5 ng/mL: Systemic infection (sepsis) is not likely. Local bacterial infection is possible. (NOTE)       Sepsis PCT Algorithm           Lower Respiratory Tract                                       Infection PCT Algorithm    ----------------------------     ----------------------------         PCT < 0.25 ng/mL                PCT < 0.10 ng/mL          Strongly encourage             Strongly discourage   discontinuation of antibiotics    initiation of antibiotics    ----------------------------     -----------------------------  PCT 0.25 - 0.50 ng/mL            PCT 0.10 - 0.25 ng/mL               OR       >80% decrease in PCT            Discourage initiation of                                            antibiotics      Encourage discontinuation           of antibiotics    ----------------------------     -----------------------------         PCT >= 0.50 ng/mL              PCT 0.26 - 0.50 ng/mL               AND        <80% decrease in PCT             Encourage initiation of                                             antibiotics       Encourage continuation           of antibiotics    ----------------------------     -----------------------------        PCT >= 0.50 ng/mL                  PCT > 0.50 ng/mL               AND         increase in PCT                  Strongly encourage                                      initiation of antibiotics    Strongly encourage escalation           of antibiotics                                     -----------------------------                                           PCT <= 0.25 ng/mL                                                 OR                                        > 80% decrease in PCT  Discontinue / Do not initiate                                             antibiotics  Performed at [facility], [address] [place], [place] [address]   Lactic acid, plasma     Status: None   Collection Time: 03/23/21  4:26 PM  Result Value Ref Range   Lactic Acid, Venous 1.1 0.5 - 1.9 mmol/L    Comment: Performed at [facility], [address] [place], [place] [address]  Glucose, capillary     Status: Abnormal   Collection Time: 03/23/21  6:21 PM  Result Value Ref Range   Glucose-Capillary 198 (H) 70 - 99 mg/dL    Comment: Glucose reference range applies only to samples taken after fasting for at least 8 hours.  Lactic acid, plasma     Status: None   Collection Time: 03/23/21  8:04 PM  Result Value Ref Range   Lactic Acid, Venous 1.1 0.5 - 1.9 mmol/L    Comment: Performed at [facility], [address] [place], [place] [address]  Glucose, capillary     Status: Abnormal   Collection Time: 03/23/21  8:32 PM  Result Value Ref Range   Glucose-Capillary 278 (H) 70 - 99 mg/dL    Comment: Glucose reference range applies only to samples taken after fasting for at least 8 hours.  Procalcitonin     Status: None   Collection Time: 03/24/21  6:16 AM  Result Value Ref Range   Procalcitonin 0.26 ng/mL    Comment:        Interpretation: PCT (Procalcitonin) <= 0.5 ng/mL: Systemic infection (sepsis) is not likely. Local bacterial infection is possible. (NOTE)       Sepsis PCT Algorithm           Lower Respiratory Tract                                      Infection PCT Algorithm    ----------------------------     ----------------------------         PCT < 0.25 ng/mL                PCT < 0.10 ng/mL          Strongly encourage             Strongly discourage   discontinuation of antibiotics    initiation of antibiotics    ----------------------------     -----------------------------       PCT 0.25 - 0.50 ng/mL            PCT 0.10 - 0.25 ng/mL               OR       >80% decrease in PCT            Discourage initiation of                                            antibiotics      Encourage discontinuation           of antibiotics    ----------------------------     -----------------------------  PCT >= 0.50 ng/mL              PCT 0.26 - 0.50 ng/mL               AND        <80% decrease in PCT              Encourage initiation of                                             antibiotics       Encourage continuation           of antibiotics    ----------------------------     -----------------------------        PCT >= 0.50 ng/mL                  PCT > 0.50 ng/mL               AND         increase in PCT                  Strongly encourage                                      initiation of antibiotics    Strongly encourage escalation           of antibiotics                                     -----------------------------                                           PCT <= 0.25 ng/mL                                                 OR                                        > 80% decrease in PCT                                      Discontinue / Do not initiate                                             antibiotics  Performed at [facility], [address] [place], [place] [address]   CBC     Status: Abnormal   Collection Time: 03/24/21  6:16 AM  Result Value Ref Range   WBC 17.1 (H) 4.0 - 10.5 K/uL   RBC 3.43 (L) 4.22 - 5.81 MIL/uL   Hemoglobin 10.2 (L) 13.0 - 17.0 g/dL  HCT 31.7 (L) 39.0 - 52.0 %   MCV 92.4 80.0 - 100.0 fL   MCH 29.7 26.0 - 34.0 pg   MCHC 32.2 30.0 - 36.0 g/dL   RDW 12.0 11.5 - 15.5 %   Platelets 318 150 - 400 K/uL   nRBC 0.0 0.0 - 0.2 %    Comment: Performed at [facility], [address] [place], [place] [address]  Comprehensive metabolic panel     Status: Abnormal   Collection Time: 03/24/21  6:16 AM  Result Value Ref Range   Sodium 128 (L) 135 - 145 mmol/L   Potassium 4.7 3.5 - 5.1 mmol/L   Chloride 98 98 - 111 mmol/L   CO2 21 (L) 22 - 32 mmol/L   Glucose, Bld 222 (H) 70 - 99 mg/dL    Comment: Glucose reference range applies only to samples taken after fasting for at least 8 hours.   BUN 65 (H) 8 - 23 mg/dL   Creatinine, Ser 1.61 (H) 0.61 - 1.24 mg/dL   Calcium 7.9 (L) 8.9 - 10.3 mg/dL   Total Protein 6.2 (L) 6.5 - 8.1 g/dL    Albumin 2.7 (L) 3.5 - 5.0 g/dL   AST 197 (H) 15 - 41 U/L   ALT 121 (H) 0 - 44 U/L   Alkaline Phosphatase 60 38 - 126 U/L   Total Bilirubin 0.6 0.3 - 1.2 mg/dL   GFR, Estimated 44 (L) >60 mL/min    Comment: (NOTE) Calculated using the CKD-EPI Creatinine Equation (2021)    Anion gap 9 5 - 15    Comment: Performed at [facility], [address] [place], [place] [address]  Glucose, capillary     Status: Abnormal   Collection Time: 03/24/21  7:28 AM  Result Value Ref Range   Glucose-Capillary 216 (H) 70 - 99 mg/dL    Comment: Glucose reference range applies only to samples taken after fasting for at least 8 hours.  Sodium, urine, random     Status: None   Collection Time: 03/24/21 10:23 AM  Result Value Ref Range   Sodium, Ur 21 mmol/L    Comment: Performed at [facility], [address] [place], [place] [address]  Osmolality, urine     Status: None   Collection Time: 03/24/21 10:23 AM  Result Value Ref Range   Osmolality, Ur 424 300 - 900 mOsm/kg    Comment: Performed at [facility], [address] [place], [place] [address]  Glucose, capillary     Status: Abnormal   Collection Time: 03/24/21 11:40 AM  Result Value Ref Range   Glucose-Capillary 266 (H) 70 - 99 mg/dL    Comment: Glucose reference range applies only to samples taken after fasting for at least 8 hours.  Glucose, capillary     Status: Abnormal   Collection Time: 03/24/21  4:33 PM  Result Value Ref Range   Glucose-Capillary 227 (H) 70 - 99 mg/dL    Comment: Glucose reference range applies only to samples taken after fasting for at least 8 hours.  Glucose, capillary     Status: Abnormal   Collection Time: 03/24/21  8:41 PM  Result Value Ref Range   Glucose-Capillary 220 (H) 70 - 99 mg/dL    Comment: Glucose reference range applies only to samples taken after fasting for at least 8 hours.  Procalcitonin     Status: None   Collection Time: 03/25/21  4:58 AM  Result Value Ref Range    Procalcitonin  0.28 ng/mL    Comment:        Interpretation: PCT (Procalcitonin) <= 0.5 ng/mL: Systemic infection (sepsis) is not likely. Local bacterial infection is possible. (NOTE)       Sepsis PCT Algorithm           Lower Respiratory Tract                                      Infection PCT Algorithm    ----------------------------     ----------------------------         PCT < 0.25 ng/mL                PCT < 0.10 ng/mL          Strongly encourage             Strongly discourage   discontinuation of antibiotics    initiation of antibiotics    ----------------------------     -----------------------------       PCT 0.25 - 0.50 ng/mL            PCT 0.10 - 0.25 ng/mL               OR       >80% decrease in PCT            Discourage initiation of                                            antibiotics      Encourage discontinuation           of antibiotics    ----------------------------     -----------------------------         PCT >= 0.50 ng/mL              PCT 0.26 - 0.50 ng/mL               AND        <80% decrease in PCT             Encourage initiation of                                             antibiotics       Encourage continuation           of antibiotics    ----------------------------     -----------------------------        PCT >= 0.50 ng/mL                  PCT > 0.50 ng/mL               AND         increase in PCT                  Strongly encourage                                      initiation of antibiotics    Strongly encourage escalation           of antibiotics                                     -----------------------------  PCT <= 0.25 ng/mL                                                 OR                                        > 80% decrease in PCT                                      Discontinue / Do not initiate                                             antibiotics  Performed at [facility], [address] [place], [place] [address]   CBC     Status: Abnormal   Collection Time: 03/25/21  4:58 AM  Result Value Ref Range   WBC 17.0 (H) 4.0 - 10.5 K/uL   RBC 3.12 (L) 4.22 - 5.81 MIL/uL   Hemoglobin 9.4 (L) 13.0 - 17.0 g/dL   HCT 28.7 (L) 39.0 - 52.0 %   MCV 92.0 80.0 - 100.0 fL   MCH 30.1 26.0 - 34.0 pg   MCHC 32.8 30.0 - 36.0 g/dL   RDW 12.0 11.5 - 15.5 %   Platelets 331 150 - 400 K/uL   nRBC 0.0 0.0 - 0.2 %    Comment: Performed at [facility], [address], [place], [place] [address]  Comprehensive metabolic panel     Status: Abnormal   Collection Time: 03/25/21  4:58 AM  Result Value Ref Range   Sodium 128 (L) 135 - 145 mmol/L   Potassium 4.4 3.5 - 5.1 mmol/L   Chloride 102 98 - 111 mmol/L   CO2 20 (L) 22 - 32 mmol/L   Glucose, Bld 175 (H) 70 - 99 mg/dL    Comment: Glucose reference range applies only to samples taken after fasting for at least 8 hours.   BUN 61 (H) 8 - 23 mg/dL   Creatinine, Ser 1.52 (H) 0.61 - 1.24 mg/dL   Calcium 8.0 (L) 8.9 - 10.3 mg/dL   Total Protein 6.0 (L) 6.5 - 8.1 g/dL   Albumin 2.5 (L) 3.5 - 5.0 g/dL   AST 104 (H) 15 - 41 U/L   ALT 103 (H) 0 - 44 U/L   Alkaline Phosphatase 63 38 - 126 U/L   Total Bilirubin 0.6 0.3 - 1.2 mg/dL   GFR, Estimated 47 (L) >60 mL/min    Comment: (NOTE) Calculated using the CKD-EPI Creatinine Equation (2021)    Anion gap 6 5 - 15    Comment: Performed at [facility], [address] [place], [place] [address]  Glucose, capillary     Status: Abnormal   Collection Time: 03/25/21  7:49 AM  Result Value Ref Range   Glucose-Capillary 177 (H) 70 - 99 mg/dL    Comment: Glucose reference range applies only to samples taken after fasting for at least 8 hours.    Recent Results (from the past 240 hour(s))  Urine culture  Status: Abnormal   Collection Time: 03/17/21  7:42 PM   Specimen: Urine, Random  Result Value Ref Range Status   Specimen Description   Final    URINE,  RANDOM Performed at [facility], [address] [place], [place] [address]    Special Requests   Final    NONE Performed at [facility], [place], [place] [address]    Culture >=100,000 COLONIES/mL ESCHERICHIA COLI (A)  Final   Report Status 03/20/2021 FINAL  Final   Organism ID, Bacteria ESCHERICHIA COLI (A)  Final      Susceptibility   Escherichia coli - MIC*    AMPICILLIN >=32 RESISTANT Resistant     CEFAZOLIN >=64 RESISTANT Resistant     CEFEPIME <=0.12 SENSITIVE Sensitive     CEFTRIAXONE <=0.25 SENSITIVE Sensitive     CIPROFLOXACIN >=4 RESISTANT Resistant     GENTAMICIN <=1 SENSITIVE Sensitive     IMIPENEM <=0.25 SENSITIVE Sensitive     NITROFURANTOIN <=16 SENSITIVE Sensitive     TRIMETH/SULFA <=20 SENSITIVE Sensitive     AMPICILLIN/SULBACTAM >=32 RESISTANT Resistant     PIP/TAZO <=4 SENSITIVE Sensitive     * >=100,000 COLONIES/mL ESCHERICHIA COLI  Resp Panel by RT-PCR (Flu A&B, Covid) Nasopharyngeal Swab     Status: None   Collection Time: 03/17/21  8:49 PM   Specimen: Nasopharyngeal Swab; Nasopharyngeal(NP) swabs in vial transport medium  Result Value Ref Range Status   SARS Coronavirus 2 by RT PCR NEGATIVE NEGATIVE Final    Comment: (NOTE) SARS-CoV-2 target nucleic acids are NOT DETECTED.  The SARS-CoV-2 RNA is generally detectable in upper respiratory specimens during the acute phase of infection. The lowest concentration of SARS-CoV-2 viral copies this assay can detect is 138 copies/mL. A negative result does not preclude SARS-Cov-2 infection and should not be used as the sole basis for treatment or other patient management decisions. A negative result may occur with  improper specimen collection/handling, submission of specimen other than nasopharyngeal swab, presence of viral mutation(s) within the areas targeted by this assay, and inadequate number of viral copies(<138 copies/mL). A negative result must be combined  with clinical observations, patient history, and epidemiological information. The expected result is Negative.  Fact Sheet for Patients:  EntrepreneurPulse.com.au  Fact Sheet for Healthcare Providers:  IncredibleEmployment.be  This test is no t yet approved or cleared by the [place] FDA and  has been authorized for detection and/or diagnosis of SARS-CoV-2 by FDA under an Emergency Use Authorization (EUA). This EUA will remain  in effect (meaning this test can be used) for the duration of the COVID-19 declaration under Section 564(b)(1) of the Act, 21 U.S.C.section 360bbb-3(b)(1), unless the authorization is terminated  or revoked sooner.       Influenza A by PCR NEGATIVE NEGATIVE Final   Influenza B by PCR NEGATIVE NEGATIVE Final    Comment: (NOTE) The Xpert Xpress SARS-CoV-2/FLU/RSV plus assay is intended as an aid in the diagnosis of influenza from Nasopharyngeal swab specimens and should not be used as a sole basis for treatment. Nasal washings and aspirates are unacceptable for Xpert Xpress SARS-CoV-2/FLU/RSV testing.  Fact Sheet for Patients: EntrepreneurPulse.com.au  Fact Sheet for Healthcare Providers: IncredibleEmployment.be  This test is not yet approved or cleared by the [place] FDA and has been authorized for detection and/or diagnosis of SARS-CoV-2 by FDA under an Emergency Use Authorization (EUA). This EUA will remain in effect (meaning this test can be used) for the duration of the COVID-19  declaration under Section 564(b)(1) of the Act, 21 U.S.C. section 360bbb-3(b)(1), unless the authorization is terminated or revoked.  Performed at [facility], [address], [place], [place] [address]   Urine Culture     Status: None   Collection Time: 03/23/21  1:26 PM   Specimen: Urine, Random  Result Value Ref Range Status   Specimen Description   Final    URINE,  RANDOM Performed at [facility], [address] [place], [place] [address]    Special Requests   Final    NONE Performed at [facility], [address] [place], [place] [address]    Culture   Final    NO GROWTH Performed at [facility], [place] [address] [place], [place] [address]    Report Status 03/25/2021 FINAL  Final  CULTURE, BLOOD (ROUTINE X 2) w Reflex to ID Panel     Status: None (Preliminary result)   Collection Time: 03/23/21  1:47 PM   Specimen: BLOOD  Result Value Ref Range Status   Specimen Description BLOOD RIGHT ANTECUBITAL  Final   Special Requests   Final    BOTTLES DRAWN AEROBIC AND ANAEROBIC Blood Culture adequate volume   Culture   Final    NO GROWTH 2 DAYS Performed at [facility], [address], [place], [place] [address]    Report Status PENDING  Incomplete  CULTURE, BLOOD (ROUTINE X 2) w Reflex to ID Panel     Status: None (Preliminary result)   Collection Time: 03/23/21  1:54 PM   Specimen: BLOOD  Result Value Ref Range Status   Specimen Description BLOOD LEFT ANTECUBITAL  Final   Special Requests   Final    BOTTLES DRAWN AEROBIC AND ANAEROBIC Blood Culture adequate volume   Culture   Final    NO GROWTH 2 DAYS Performed at [facility] [address] [place],  [address]    Report Status PENDING  Incomplete    Lipid Panel No results for input(s): CHOL, TRIG, HDL, CHOLHDL, VLDL, LDLCALC in the last 72 hours.  Studies/Results: MR CERVICAL SPINE WO CONTRAST  Result Date: 03/23/2021 CLINICAL DATA:  Agitation and weakness. History of dementia and Parkinson's disease. Multiple falls. Cervical radiculopathy and mid back pain. EXAM: MRI CERVICAL AND THORACIC SPINE WITHOUT CONTRAST TECHNIQUE: Multiplanar and multiecho pulse sequences of the cervical spine, to include the craniocervical junction and cervicothoracic junction, and the thoracic spine, were obtained without intravenous contrast.  COMPARISON:  Lumbar MRI 03/21/2021.  CT lumbar spine 03/17/2021. FINDINGS: MRI CERVICAL SPINE FINDINGS Alignment: Straightening without focal angulation or significant listhesis. Vertebrae: There is an indeterminate well-circumscribed lesion centrally in the C4 vertebral body which measures 12 mm in diameter. This demonstrates low T1 and high T2 signal. No other suspicious osseous lesions. No evidence of acute fracture. There is probable ankylosis of the facet joints on the right at C2-3 and C3-4. Cord: The CSF surrounding the cord is partially effaced, but there is no cord deformity or abnormal cord signal. Posterior Fossa, vertebral arteries, paraspinal tissues: The visualized posterior fossa appears unremarkable. There are bilateral vertebral artery flow voids. No paraspinal abnormalities are seen. Disc levels: C2-3: The disc appears normal. The right facet joint appears ankylosed. No spinal stenosis or nerve root encroachment. C3-4: Mild disc bulging and uncinate spurring. The right facet joint appears ankylosed. No cord deformity or nerve root encroachment. C4-5: Spondylosis with posterior osteophytes covering diffusely bulging disc material. There is asymmetric uncinate spurring on  the left contributing to effacement of the CSF surrounding the cord. There is no cord deformity. Moderate foraminal narrowing bilaterally. C5-6: Spondylosis with posterior osteophytes covering diffusely bulging disc material. The CSF surrounding the cord is effaced without cord deformity. Moderate foraminal narrowing bilaterally. C6-7: Spondylosis with posterior osteophytes covering diffusely bulging disc material. The CSF surrounding the cord is effaced without cord deformity. Mild right and moderate left foraminal narrowing. C7-T1: Mild disc bulging and facet hypertrophy. No cord deformity or significant foraminal compromise. MRI THORACIC SPINE FINDINGS Alignment:  Normal. Vertebrae: No evidence of acute fracture or suspicious  marrow lesion. A lesion centrally in the T5 vertebral body demonstrates areas of high T1 and T2 signal, most consistent with a hemangioma. Cord: Normal in signal and caliber. The conus medullaris extends to the L1 level. Paraspinal and other soft tissues: No paraspinal abnormalities. Disc levels: There is mild disc bulging and endplate osteophyte formation throughout the thoracic spine. There is spurring at the costovertebral junctions. No large disc herniation or significant spinal stenosis demonstrated. IMPRESSION: 1. No acute findings are identified within the cervicothoracic spine. 2. 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 The CSF surrounding the cord is partially effaced from C4-5 through C6-7 without associated cord deformity or abnormal cord signal. Moderate foraminal narrowing is present at these levels. 3. No significant spinal stenosis or nerve root encroachment in the thoracic spine. 4. Indeterminate T2 hyperintense lesion in the C4 vertebral body. This could reflect a metastasis, but no other suspicious marrow lesions are identified. Suggest follow-up MRI of the cervical spine without and with contrast in 3-6 months. Electronically Signed   By: Richardean Sale M.D.   On: 03/23/2021 18:08   MR THORACIC SPINE WO CONTRAST  Result Date: 03/23/2021 CLINICAL DATA:  Agitation and weakness. History of dementia and Parkinson's disease. Multiple falls. Cervical radiculopathy and mid back pain. EXAM: MRI CERVICAL AND THORACIC SPINE WITHOUT CONTRAST TECHNIQUE: Multiplanar and multiecho pulse sequences of the cervical spine, to include the craniocervical junction and cervicothoracic junction, and the thoracic spine, were obtained without intravenous contrast. COMPARISON:  Lumbar MRI 03/21/2021.  CT lumbar spine 03/17/2021. FINDINGS: MRI CERVICAL SPINE FINDINGS Alignment: Straightening without focal angulation or significant listhesis. Vertebrae:  There is an indeterminate well-circumscribed lesion centrally in the C4 vertebral body which measures 12 mm in diameter. This demonstrates low T1 and high T2 signal. No other suspicious osseous lesions. No evidence of acute fracture. There is probable ankylosis of the facet joints on the right at C2-3 and C3-4. Cord: The CSF surrounding the cord is partially effaced, but there is no cord deformity or abnormal cord signal. Posterior Fossa, vertebral arteries, paraspinal tissues: The visualized posterior fossa appears unremarkable. There are bilateral vertebral artery flow voids. No paraspinal abnormalities are seen. Disc levels: C2-3: The disc appears normal. The right facet joint appears ankylosed. No spinal stenosis or nerve root encroachment. C3-4: Mild disc bulging and uncinate spurring. The right facet joint appears ankylosed. No cord deformity or nerve root encroachment. C4-5: Spondylosis with posterior osteophytes covering diffusely bulging disc material. There is asymmetric uncinate spurring on the left contributing to effacement of the CSF surrounding the cord. There is no cord deformity. Moderate foraminal narrowing bilaterally. C5-6: Spondylosis with posterior osteophytes covering diffusely bulging disc material. The CSF surrounding the cord is effaced without cord deformity. Moderate foraminal narrowing bilaterally. C6-7: Spondylosis with posterior osteophytes covering diffusely bulging disc material. The CSF surrounding the cord is effaced without cord deformity.  Mild right and moderate left foraminal narrowing. C7-T1: Mild disc bulging and facet hypertrophy. No cord deformity or significant foraminal compromise. MRI THORACIC SPINE FINDINGS Alignment:  Normal. Vertebrae: No evidence of acute fracture or suspicious marrow lesion. A lesion centrally in the T5 vertebral body demonstrates areas of high T1 and T2 signal, most consistent with a hemangioma. Cord: Normal in signal and caliber. The conus  medullaris extends to the L1 level. Paraspinal and other soft tissues: No paraspinal abnormalities. Disc levels: There is mild disc bulging and endplate osteophyte formation throughout the thoracic spine. There is spurring at the costovertebral junctions. No large disc herniation or significant spinal stenosis demonstrated. IMPRESSION: 1. No acute findings are identified within the cervicothoracic spine. 2. 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 The CSF surrounding the cord is partially effaced from C4-5 through C6-7 without associated cord deformity or abnormal cord signal. Moderate foraminal narrowing is present at these levels. 3. No significant spinal stenosis or nerve root encroachment in the thoracic spine. 4. Indeterminate T2 hyperintense lesion in the C4 vertebral body. This could reflect a metastasis, but no other suspicious marrow lesions are identified. Suggest follow-up MRI of the cervical spine without and with contrast in 3-6 months. Electronically Signed   By: Richardean Sale M.D.   On: 03/23/2021 18:08    Medications:  Scheduled: . Chlorhexidine Gluconate Cloth  6 each Topical Daily  . donepezil  10 mg Oral QHS  . dutasteride  0.5 mg Oral Daily  . enoxaparin (LOVENOX) injection  0.5 mg/kg Subcutaneous Q24H  . insulin aspart  0-20 Units Subcutaneous TID WC  . insulin aspart  0-5 Units Subcutaneous QHS  . insulin detemir  30 Units Subcutaneous QHS  . sertraline  50 mg Oral Daily  . simvastatin  40 mg Oral QPM  . tamsulosin  0.4 mg Oral Daily   Continuous: . sodium chloride Stopped (03/22/21 2240)  . sodium chloride 100 mL/hr at 03/25/21 0302  . ceFEPime (MAXIPIME) IV 2 g (03/24/21 1704)  . vancomycin 1,250 mg (03/24/21 1815)   MRI cervical and thoracic spine: 1. No acute findings are identified within the cervicothoracic spine. 2. 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 The CSF surrounding the cord is partially effaced from C4-5 through C6-7 without associated cord deformity or abnormal cord signal. Moderate foraminal narrowing is present at these levels. 3. No significant spinal stenosis or nerve root encroachment in the thoracic spine. 4. Indeterminate T2 hyperintense lesion in the C4 vertebral body. This could reflect a metastasis, but no other suspicious marrow lesions are identified. Suggest follow-up MRI of the cervical spine without and with contrast in 3-6 months.  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 - Severe central canal and bilateral subarticular recess narrowing at L3-4. - Moderately severe central canal and bilateral subarticular recess narrowing L2-3. Mild to moderate foraminal narrowing at L2-3 is worse on the left.  MRI brain: No evidence of recent infarction, hemorrhage, or mass. Mild to moderate chronic microvascular ischemic changes.  Assessment:  80 y.o. male with altered mental status and progressive weakness in lower extremities over last 3 weeks. Found to have UTI on ceftriaxone. 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 4/2 became febrile with leukocytosis and abx escalated to vanc/cefepime 1. Exam today reveals BLE weakness with absent lower extremity reflexes in the context of edema and prior knee/ankle surgeries bilaterally.  Upper extremity strength is 4+/5 with preserved reflexes. Action and postural tremors are again noted. Cognition is impaired, but exam findings are not suggestive of a delirium.  2. Na has trended downwards during this admission. Now hyponatremic at 128. May be contributing to his weakness, but does not account for this fully.  3. Elevated BUN and Cr. BUN has increased this admission from 39 to 61. Cr has been in the 1.46 - 1.61 range and is 1.52 today. Uremia may be contributing to his AMS in conjunction with infection.  4.  Elevated AST and ALT 5. WBC 17 today. Was 11.6 on admission.  6. BLE weakness is most likely multifactorial, with lumbar spinal stenosis, BLE edema, deconditioning with decreased muscle mass, diffuse weakness due to infection and hyponatremia likely being contributing factors. 7. Suspect that he may have a diabetic peripheral neuropathy, which could contribute to his weakness. 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     Recommendations: 1) Seen by [facility] who felt he may benefit from L3/4 decompression but as outpatient after medical optimization.  2) Prioritize infectious w/u and tx per hospitalist team. UA/UCx, blood cultures x2 pending. 3) Nonspecific T2 hyperintense lesion in the C4 vertebral body - f/u MRI c spine with and without contrast in 3 mos (include in discharge summary) 4) Management of BLE edema per primary team 5) PT/OT 6) Discontinue his statin, as statin myopathy is on the DDx.  7) Myoglobin and CK levels (ordered)   LOS: 8 days   @Electronically  signed: Dr. Kerney Elbe 03/25/2021  8:36 AM

## 2021-03-25 NOTE — Progress Notes (Signed)
Call from Lab that a re swab for COVID was necessary that a test error occurred with prior specimen.

## 2021-03-25 NOTE — Care Management Important Message (Signed)
Important Message  Patient Details  Name: KAZI REPPOND MRN: [id number] Date of Birth: 07/20/1941   Medicare Important Message Given:  Yes     Dannette Barbara 03/25/2021, 12:08 PM

## 2021-03-25 NOTE — Progress Notes (Signed)
PROGRESS NOTE    Norman Palmer  WUJ:[id number] DOB: 1941/10/08 DOA: 03/17/2021 PCP: Birdie Sons, MD   Brief Narrative: Norman Palmer is a 80 y.o. male with history of dementia, diabetes, hypothyroidism, basal cell carcinoma of the skin.  Patient resented secondary to agitation and weakness and was found to have an acute UTI.  Patient was started on empiric antibiotics.  Hospitalization complicated by delirium in setting of underlying dementia in addition to acute urinary retention.    Assessment & Plan:   Principal Problem:   AMS (altered mental status) Active Problems:   BPH with obstruction/lower urinary tract symptoms   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[place])   Essential hypertension   Hyperlipemia   Obstructive sleep apnea   Acute lower UTI   UTI Setting of underlying BPH.  Patient empirically treated with ceftriaxone IV urine culture is significant for E. coli, sensitive to ceftriaxone. -Antibiotics escalated secondary to increased leukocytosis and mild fever. -Blood/urine culture pending  Acute metabolic encephalopathy Delirium Dementia Patient's mental status changes appear to be likely multifactorial in setting of UTI in addition to underlying dementia.  Patient is on risperidone as an outpatient which was initially restarted and now discontinued. -Delirium precautions -Continue home Aricept, Zoloft  Leukocytosis Unsure of etiology. Tmax of 100.6 F on 4/2. Procalcitonin only mildly elevated. Normal lactic acid. Appears to have reached peak. Urine culture with no growth. Blood culture with no growth to date. -Blood culture pending -Continue Vancomycin/Cefepime  Weakness Generalized in setting of infection. Also has some focal RLE weakness greater than left. History of lumbar spine pathology. No other focal findings. Now with urinary retention, but this is in setting of known urethral stricture. MRI brain without stroke. Lumbar spine with  significant lumbar pathology but nothing acute. Neurosurgery consulted and recommend conservative management at this time with possible decompression surgery considered at a later time.  Diabetes mellitus, type II Peripheral neuropathy Patient is on NPH 30 units every evening in addition to glipizide and Metformin as an outpatient.  Patient started on sliding scale insulin only while inpatient. Levemir added. Fasting blood sugar elevated. -Continue to resistant SSI -Continue Levemir 30 units nightly  Acute urinary retention Patient with a history of BPH and urethral stricture.  Complicated by UTI.  Bladder scan performed by nursing and was significant for a postvoid residual of 631 mL. Urology consulted and foley catheter was placed with recommendations for outpatient voiding trial in 1 week versus if at SNF, removal of foley at that time.  Cervical spine lesion Unknown significance at this time but concern for possible metastasis; history of basal cell carcinoma. Recommendation for repeat MRI cervical spine in 3-6 months.  Hyperlipidemia -Continue simvastatin and dutasteride  Hyponatremia Secondary to poor oral intake/dehydration. Given IV fluids -Sodium tablets x1 day  Primary hypertension -Hold triamterene, hydrochlorothiazide, lisinopril secondary to infection. -Continue hydralazine prn  BPH -Continue Flomax   DVT prophylaxis: Lovenox Code Status:   Code Status: Full Code Family Communication: Wife at bedside Disposition Plan: Anticipate discharge to SNF likely in 1-2 days pending workup for possible infection (apart from UTI) and transition to oral antibiotics if needed.    Consultants:   Urology  Neurology  Neurosurgery  Procedures:   None  Antimicrobials:  Ceftriaxone IV  Vancomycin IV  Cefepime   Subjective: No concerns per patient. Wife states he still has significant weakness.  Objective: Vitals:   03/24/21 2005 03/24/21 2301 03/25/21 0430  03/25/21 0847  BP: (!) 149/50 Marland Kitchen)  138/53 (!) 152/58 (!) 155/55  Pulse: 81 78 68 77  Resp: (!) 22 (!) 22 (!) 24 18  Temp: 98.2 F (36.8 C) 97.8 F (36.6 C) 98.6 F (37 C) 98.5 F (36.9 C)  TempSrc:  Oral  Axillary  SpO2: 95% 95% 95% 95%  Weight:      Height:        Intake/Output Summary (Last 24 hours) at 03/25/2021 0927 Last data filed at 03/25/2021 0618 Gross per 24 hour  Intake 3195.48 ml  Output 2700 ml  Net 495.48 ml   Filed Weights   03/17/21 1807  Weight: 129.7 kg    Examination:  General exam: Appears calm and comfortable Respiratory system: Clear to auscultation. Respiratory effort normal. Cardiovascular system: S1 & S2 heard, RRR. No murmurs, rubs, gallops or clicks. Gastrointestinal system: Abdomen is slightly distended, soft and nontender. No organomegaly or masses felt. Normal bowel sounds heard. Central nervous system: Alert and oriented. No focal neurological deficits. Musculoskeletal: LUE hand edema. No calf tenderness Skin: No cyanosis. No rashes Psychiatry: Judgement and insight appear impaired. Flat affect.  Data Reviewed: I have personally reviewed following labs and imaging studies  CBC Lab Results  Component Value Date   WBC 17.0 (H) 03/25/2021   RBC 3.12 (L) 03/25/2021   HGB 9.4 (L) 03/25/2021   HCT 28.7 (L) 03/25/2021   MCV 92.0 03/25/2021   MCH 30.1 03/25/2021   PLT 331 03/25/2021   MCHC 32.8 03/25/2021   RDW 12.0 03/25/2021   LYMPHSABS 2.2 03/17/2021   MONOABS 1.5 (H) 03/17/2021   EOSABS 0.1 03/17/2021   BASOSABS 0.1 67/89/3810     Last metabolic panel Lab Results  Component Value Date   NA 128 (L) 03/25/2021   K 4.4 03/25/2021   CL 102 03/25/2021   CO2 20 (L) 03/25/2021   BUN 61 (H) 03/25/2021   CREATININE 1.52 (H) 03/25/2021   GLUCOSE 175 (H) 03/25/2021   GFRNONAA 47 (L) 03/25/2021   GFRAA 43 (L) 11/26/2019   CALCIUM 8.0 (L) 03/25/2021   PROT 6.0 (L) 03/25/2021   ALBUMIN 2.5 (L) 03/25/2021   BILITOT 0.6 03/25/2021    ALKPHOS 63 03/25/2021   AST 104 (H) 03/25/2021   ALT 103 (H) 03/25/2021   ANIONGAP 6 03/25/2021    CBG (last 3)  Recent Labs    03/24/21 1633 03/24/21 2041 03/25/21 0749  GLUCAP 227* 220* 177*     GFR: Estimated Creatinine Clearance: 56.6 mL/min (A) (by C-G formula based on SCr of 1.52 mg/dL (H)).  Coagulation Profile: No results for input(s): INR, PROTIME in the last 168 hours.  Recent Results (from the past 240 hour(s))  Urine culture     Status: Abnormal   Collection Time: 03/17/21  7:42 PM   Specimen: Urine, Random  Result Value Ref Range Status   Specimen Description   Final    URINE, RANDOM Performed at [facility], [address] [place], [place] [address]    Special Requests   Final    NONE Performed at [facility], [address] [place], [place] [address]    Culture >=100,000 COLONIES/mL ESCHERICHIA COLI (A)  Final   Report Status 03/20/2021 FINAL  Final   Organism ID, Bacteria ESCHERICHIA COLI (A)  Final      Susceptibility   Escherichia coli - MIC*    AMPICILLIN >=32 RESISTANT Resistant     CEFAZOLIN >=64 RESISTANT Resistant     CEFEPIME <=0.12 SENSITIVE Sensitive     CEFTRIAXONE <=0.25  SENSITIVE Sensitive     CIPROFLOXACIN >=4 RESISTANT Resistant     GENTAMICIN <=1 SENSITIVE Sensitive     IMIPENEM <=0.25 SENSITIVE Sensitive     NITROFURANTOIN <=16 SENSITIVE Sensitive     TRIMETH/SULFA <=20 SENSITIVE Sensitive     AMPICILLIN/SULBACTAM >=32 RESISTANT Resistant     PIP/TAZO <=4 SENSITIVE Sensitive     * >=100,000 COLONIES/mL ESCHERICHIA COLI  Resp Panel by RT-PCR (Flu A&B, Covid) Nasopharyngeal Swab     Status: None   Collection Time: 03/17/21  8:49 PM   Specimen: Nasopharyngeal Swab; Nasopharyngeal(NP) swabs in vial transport medium  Result Value Ref Range Status   SARS Coronavirus 2 by RT PCR NEGATIVE NEGATIVE Final    Comment: (NOTE) SARS-CoV-2 target nucleic acids are NOT DETECTED.  The SARS-CoV-2 RNA is generally  detectable in upper respiratory specimens during the acute phase of infection. The lowest concentration of SARS-CoV-2 viral copies this assay can detect is 138 copies/mL. A negative result does not preclude SARS-Cov-2 infection and should not be used as the sole basis for treatment or other patient management decisions. A negative result may occur with  improper specimen collection/handling, submission of specimen other than nasopharyngeal swab, presence of viral mutation(s) within the areas targeted by this assay, and inadequate number of viral copies(<138 copies/mL). A negative result must be combined with clinical observations, patient history, and epidemiological information. The expected result is Negative.  Fact Sheet for Patients:  EntrepreneurPulse.com.au  Fact Sheet for Healthcare Providers:  IncredibleEmployment.be  This test is no t yet approved or cleared by the [place] FDA and  has been authorized for detection and/or diagnosis of SARS-CoV-2 by FDA under an Emergency Use Authorization (EUA). This EUA will remain  in effect (meaning this test can be used) for the duration of the COVID-19 declaration under Section 564(b)(1) of the Act, 21 U.S.C.section 360bbb-3(b)(1), unless the authorization is terminated  or revoked sooner.       Influenza A by PCR NEGATIVE NEGATIVE Final   Influenza B by PCR NEGATIVE NEGATIVE Final    Comment: (NOTE) The Xpert Xpress SARS-CoV-2/FLU/RSV plus assay is intended as an aid in the diagnosis of influenza from Nasopharyngeal swab specimens and should not be used as a sole basis for treatment. Nasal washings and aspirates are unacceptable for Xpert Xpress SARS-CoV-2/FLU/RSV testing.  Fact Sheet for Patients: EntrepreneurPulse.com.au  Fact Sheet for Healthcare Providers: IncredibleEmployment.be  This test is not yet approved or cleared by the [place] FDA  and has been authorized for detection and/or diagnosis of SARS-CoV-2 by FDA under an Emergency Use Authorization (EUA). This EUA will remain in effect (meaning this test can be used) for the duration of the COVID-19 declaration under Section 564(b)(1) of the Act, 21 U.S.C. section 360bbb-3(b)(1), unless the authorization is terminated or revoked.  Performed at [facility], [address], [place], [place] [address]   Urine Culture     Status: None   Collection Time: 03/23/21  1:26 PM   Specimen: Urine, Random  Result Value Ref Range Status   Specimen Description   Final    URINE, RANDOM Performed at [facility], [address] [place], [place] [address]    Special Requests   Final    NONE Performed at [facility], [address], [place], [place] [address]    Culture   Final    NO GROWTH Performed at [facility], [place] [address], [place], [place] [address]    Report Status 03/25/2021  FINAL  Final  CULTURE, BLOOD (ROUTINE X 2) w Reflex to ID Panel     Status: None (Preliminary result)   Collection Time: 03/23/21  1:47 PM   Specimen: BLOOD  Result Value Ref Range Status   Specimen Description BLOOD RIGHT ANTECUBITAL  Final   Special Requests   Final    BOTTLES DRAWN AEROBIC AND ANAEROBIC Blood Culture adequate volume   Culture   Final    NO GROWTH 2 DAYS Performed at [facility], [address] [place], [place] [address]    Report Status PENDING  Incomplete  CULTURE, BLOOD (ROUTINE X 2) w Reflex to ID Panel     Status: None (Preliminary result)   Collection Time: 03/23/21  1:54 PM   Specimen: BLOOD  Result Value Ref Range Status   Specimen Description BLOOD LEFT ANTECUBITAL  Final   Special Requests   Final    BOTTLES DRAWN AEROBIC AND ANAEROBIC Blood Culture adequate volume   Culture   Final    NO GROWTH 2 DAYS Performed at [facility], [address], [place], [place] [address]    Report Status PENDING   Incomplete        Radiology Studies: MR CERVICAL SPINE WO CONTRAST  Result Date: 03/23/2021 CLINICAL DATA:  Agitation and weakness. History of dementia and Parkinson's disease. Multiple falls. Cervical radiculopathy and mid back pain. EXAM: MRI CERVICAL AND THORACIC SPINE WITHOUT CONTRAST TECHNIQUE: Multiplanar and multiecho pulse sequences of the cervical spine, to include the craniocervical junction and cervicothoracic junction, and the thoracic spine, were obtained without intravenous contrast. COMPARISON:  Lumbar MRI 03/21/2021.  CT lumbar spine 03/17/2021. FINDINGS: MRI CERVICAL SPINE FINDINGS Alignment: Straightening without focal angulation or significant listhesis. Vertebrae: There is an indeterminate well-circumscribed lesion centrally in the C4 vertebral body which measures 12 mm in diameter. This demonstrates low T1 and high T2 signal. No other suspicious osseous lesions. No evidence of acute fracture. There is probable ankylosis of the facet joints on the right at C2-3 and C3-4. Cord: The CSF surrounding the cord is partially effaced, but there is no cord deformity or abnormal cord signal. Posterior Fossa, vertebral arteries, paraspinal tissues: The visualized posterior fossa appears unremarkable. There are bilateral vertebral artery flow voids. No paraspinal abnormalities are seen. Disc levels: C2-3: The disc appears normal. The right facet joint appears ankylosed. No spinal stenosis or nerve root encroachment. C3-4: Mild disc bulging and uncinate spurring. The right facet joint appears ankylosed. No cord deformity or nerve root encroachment. C4-5: Spondylosis with posterior osteophytes covering diffusely bulging disc material. There is asymmetric uncinate spurring on the left contributing to effacement of the CSF surrounding the cord. There is no cord deformity. Moderate foraminal narrowing bilaterally. C5-6: Spondylosis with posterior osteophytes covering diffusely bulging disc material. The  CSF surrounding the cord is effaced without cord deformity. Moderate foraminal narrowing bilaterally. C6-7: Spondylosis with posterior osteophytes covering diffusely bulging disc material. The CSF surrounding the cord is effaced without cord deformity. Mild right and moderate left foraminal narrowing. C7-T1: Mild disc bulging and facet hypertrophy. No cord deformity or significant foraminal compromise. MRI THORACIC SPINE FINDINGS Alignment:  Normal. Vertebrae: No evidence of acute fracture or suspicious marrow lesion. A lesion centrally in the T5 vertebral body demonstrates areas of high T1 and T2 signal, most consistent with a hemangioma. Cord: Normal in signal and caliber. The conus medullaris extends to the L1 level. Paraspinal and other soft tissues: No paraspinal abnormalities. Disc levels: There is mild disc bulging  and endplate osteophyte formation throughout the thoracic spine. There is spurring at the costovertebral junctions. No large disc herniation or significant spinal stenosis demonstrated. IMPRESSION: 1. No acute findings are identified within the cervicothoracic spine. 2. 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 The CSF surrounding the cord is partially effaced from C4-5 through C6-7 without associated cord deformity or abnormal cord signal. Moderate foraminal narrowing is present at these levels. 3. No significant spinal stenosis or nerve root encroachment in the thoracic spine. 4. Indeterminate T2 hyperintense lesion in the C4 vertebral body. This could reflect a metastasis, but no other suspicious marrow lesions are identified. Suggest follow-up MRI of the cervical spine without and with contrast in 3-6 months. Electronically Signed   By: Richardean Sale M.D.   On: 03/23/2021 18:08   MR THORACIC SPINE WO CONTRAST  Result Date: 03/23/2021 CLINICAL DATA:  Agitation and weakness. History of dementia and Parkinson's disease.  Multiple falls. Cervical radiculopathy and mid back pain. EXAM: MRI CERVICAL AND THORACIC SPINE WITHOUT CONTRAST TECHNIQUE: Multiplanar and multiecho pulse sequences of the cervical spine, to include the craniocervical junction and cervicothoracic junction, and the thoracic spine, were obtained without intravenous contrast. COMPARISON:  Lumbar MRI 03/21/2021.  CT lumbar spine 03/17/2021. FINDINGS: MRI CERVICAL SPINE FINDINGS Alignment: Straightening without focal angulation or significant listhesis. Vertebrae: There is an indeterminate well-circumscribed lesion centrally in the C4 vertebral body which measures 12 mm in diameter. This demonstrates low T1 and high T2 signal. No other suspicious osseous lesions. No evidence of acute fracture. There is probable ankylosis of the facet joints on the right at C2-3 and C3-4. Cord: The CSF surrounding the cord is partially effaced, but there is no cord deformity or abnormal cord signal. Posterior Fossa, vertebral arteries, paraspinal tissues: The visualized posterior fossa appears unremarkable. There are bilateral vertebral artery flow voids. No paraspinal abnormalities are seen. Disc levels: C2-3: The disc appears normal. The right facet joint appears ankylosed. No spinal stenosis or nerve root encroachment. C3-4: Mild disc bulging and uncinate spurring. The right facet joint appears ankylosed. No cord deformity or nerve root encroachment. C4-5: Spondylosis with posterior osteophytes covering diffusely bulging disc material. There is asymmetric uncinate spurring on the left contributing to effacement of the CSF surrounding the cord. There is no cord deformity. Moderate foraminal narrowing bilaterally. C5-6: Spondylosis with posterior osteophytes covering diffusely bulging disc material. The CSF surrounding the cord is effaced without cord deformity. Moderate foraminal narrowing bilaterally. C6-7: Spondylosis with posterior osteophytes covering diffusely bulging disc  material. The CSF surrounding the cord is effaced without cord deformity. Mild right and moderate left foraminal narrowing. C7-T1: Mild disc bulging and facet hypertrophy. No cord deformity or significant foraminal compromise. MRI THORACIC SPINE FINDINGS Alignment:  Normal. Vertebrae: No evidence of acute fracture or suspicious marrow lesion. A lesion centrally in the T5 vertebral body demonstrates areas of high T1 and T2 signal, most consistent with a hemangioma. Cord: Normal in signal and caliber. The conus medullaris extends to the L1 level. Paraspinal and other soft tissues: No paraspinal abnormalities. Disc levels: There is mild disc bulging and endplate osteophyte formation throughout the thoracic spine. There is spurring at the costovertebral junctions. No large disc herniation or significant spinal stenosis demonstrated. IMPRESSION: 1. No acute findings are identified within the cervicothoracic spine. 2. 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 The CSF surrounding the cord is partially effaced from C4-5 through C6-7 without associated  cord deformity or abnormal cord signal. Moderate foraminal narrowing is present at these levels. 3. No significant spinal stenosis or nerve root encroachment in the thoracic spine. 4. Indeterminate T2 hyperintense lesion in the C4 vertebral body. This could reflect a metastasis, but no other suspicious marrow lesions are identified. Suggest follow-up MRI of the cervical spine without and with contrast in 3-6 months. Electronically Signed   By: Richardean Sale M.D.   On: 03/23/2021 18:08        Scheduled Meds: . Chlorhexidine Gluconate Cloth  6 each Topical Daily  . donepezil  10 mg Oral QHS  . dutasteride  0.5 mg Oral Daily  . enoxaparin (LOVENOX) injection  0.5 mg/kg Subcutaneous Q24H  . insulin aspart  0-20 Units Subcutaneous TID WC  . insulin aspart  0-5 Units Subcutaneous QHS  . insulin detemir   30 Units Subcutaneous QHS  . sertraline  50 mg Oral Daily  . simvastatin  40 mg Oral QPM  . sodium chloride  1 g Oral TID WC  . tamsulosin  0.4 mg Oral Daily   Continuous Infusions: . sodium chloride Stopped (03/22/21 2240)  . sodium chloride 100 mL/hr at 03/25/21 0302  . ceFEPime (MAXIPIME) IV 2 g (03/25/21 0849)  . vancomycin 1,250 mg (03/24/21 1815)     LOS: 8 days     Cordelia Poche, MD [facility] 03/25/2021, 9:27 AM  If 7PM-7AM, please contact night-coverage [email or web address]

## 2021-03-26 DIAGNOSIS — N1831 Chronic kidney disease, stage 3a: Secondary | ICD-10-CM | POA: Diagnosis not present

## 2021-03-26 DIAGNOSIS — G9341 Metabolic encephalopathy: Secondary | ICD-10-CM

## 2021-03-26 DIAGNOSIS — F0390 Unspecified dementia without behavioral disturbance: Secondary | ICD-10-CM | POA: Diagnosis not present

## 2021-03-26 DIAGNOSIS — E039 Hypothyroidism, unspecified: Secondary | ICD-10-CM | POA: Diagnosis not present

## 2021-03-26 DIAGNOSIS — M6281 Muscle weakness (generalized): Secondary | ICD-10-CM | POA: Diagnosis not present

## 2021-03-26 DIAGNOSIS — E1122 Type 2 diabetes mellitus with diabetic chronic kidney disease: Secondary | ICD-10-CM | POA: Diagnosis not present

## 2021-03-26 DIAGNOSIS — E871 Hypo-osmolality and hyponatremia: Secondary | ICD-10-CM | POA: Diagnosis not present

## 2021-03-26 DIAGNOSIS — M2550 Pain in unspecified joint: Secondary | ICD-10-CM | POA: Diagnosis not present

## 2021-03-26 DIAGNOSIS — M5416 Radiculopathy, lumbar region: Secondary | ICD-10-CM | POA: Diagnosis not present

## 2021-03-26 DIAGNOSIS — F39 Unspecified mood [affective] disorder: Secondary | ICD-10-CM | POA: Diagnosis not present

## 2021-03-26 DIAGNOSIS — R4189 Other symptoms and signs involving cognitive functions and awareness: Secondary | ICD-10-CM | POA: Diagnosis not present

## 2021-03-26 DIAGNOSIS — E1159 Type 2 diabetes mellitus with other circulatory complications: Secondary | ICD-10-CM | POA: Diagnosis not present

## 2021-03-26 DIAGNOSIS — N390 Urinary tract infection, site not specified: Secondary | ICD-10-CM | POA: Diagnosis not present

## 2021-03-26 DIAGNOSIS — R0902 Hypoxemia: Secondary | ICD-10-CM | POA: Diagnosis not present

## 2021-03-26 DIAGNOSIS — L98411 Non-pressure chronic ulcer of buttock limited to breakdown of skin: Secondary | ICD-10-CM | POA: Diagnosis not present

## 2021-03-26 DIAGNOSIS — Z7401 Bed confinement status: Secondary | ICD-10-CM | POA: Diagnosis not present

## 2021-03-26 DIAGNOSIS — L03312 Cellulitis of back [any part except buttock]: Secondary | ICD-10-CM | POA: Diagnosis not present

## 2021-03-26 DIAGNOSIS — M48061 Spinal stenosis, lumbar region without neurogenic claudication: Secondary | ICD-10-CM | POA: Diagnosis not present

## 2021-03-26 DIAGNOSIS — F0150 Vascular dementia without behavioral disturbance: Secondary | ICD-10-CM | POA: Diagnosis not present

## 2021-03-26 DIAGNOSIS — L89153 Pressure ulcer of sacral region, stage 3: Secondary | ICD-10-CM | POA: Diagnosis not present

## 2021-03-26 DIAGNOSIS — I959 Hypotension, unspecified: Secondary | ICD-10-CM | POA: Diagnosis not present

## 2021-03-26 DIAGNOSIS — R531 Weakness: Secondary | ICD-10-CM | POA: Diagnosis not present

## 2021-03-26 DIAGNOSIS — N1832 Chronic kidney disease, stage 3b: Secondary | ICD-10-CM | POA: Diagnosis not present

## 2021-03-26 DIAGNOSIS — N183 Chronic kidney disease, stage 3 unspecified: Secondary | ICD-10-CM

## 2021-03-26 DIAGNOSIS — R338 Other retention of urine: Secondary | ICD-10-CM | POA: Diagnosis not present

## 2021-03-26 DIAGNOSIS — R410 Disorientation, unspecified: Secondary | ICD-10-CM

## 2021-03-26 DIAGNOSIS — I129 Hypertensive chronic kidney disease with stage 1 through stage 4 chronic kidney disease, or unspecified chronic kidney disease: Secondary | ICD-10-CM | POA: Diagnosis not present

## 2021-03-26 DIAGNOSIS — N401 Enlarged prostate with lower urinary tract symptoms: Secondary | ICD-10-CM | POA: Diagnosis not present

## 2021-03-26 DIAGNOSIS — Z741 Need for assistance with personal care: Secondary | ICD-10-CM | POA: Diagnosis not present

## 2021-03-26 DIAGNOSIS — R278 Other lack of coordination: Secondary | ICD-10-CM | POA: Diagnosis not present

## 2021-03-26 DIAGNOSIS — G4733 Obstructive sleep apnea (adult) (pediatric): Secondary | ICD-10-CM | POA: Diagnosis not present

## 2021-03-26 DIAGNOSIS — Z6841 Body Mass Index (BMI) 40.0 and over, adult: Secondary | ICD-10-CM | POA: Diagnosis not present

## 2021-03-26 DIAGNOSIS — I10 Essential (primary) hypertension: Secondary | ICD-10-CM | POA: Diagnosis not present

## 2021-03-26 DIAGNOSIS — R404 Transient alteration of awareness: Secondary | ICD-10-CM | POA: Diagnosis not present

## 2021-03-26 DIAGNOSIS — E1140 Type 2 diabetes mellitus with diabetic neuropathy, unspecified: Secondary | ICD-10-CM | POA: Diagnosis not present

## 2021-03-26 DIAGNOSIS — L0390 Cellulitis, unspecified: Secondary | ICD-10-CM | POA: Diagnosis not present

## 2021-03-26 LAB — CBC
HCT: 29.7 % — ABNORMAL LOW (ref 39.0–52.0)
Hemoglobin: 9.6 g/dL — ABNORMAL LOW (ref 13.0–17.0)
MCH: 29.4 pg (ref 26.0–34.0)
MCHC: 32.3 g/dL (ref 30.0–36.0)
MCV: 91.1 fL (ref 80.0–100.0)
Platelets: 330 10*3/uL (ref 150–400)
RBC: 3.26 MIL/uL — ABNORMAL LOW (ref 4.22–5.81)
RDW: 11.9 % (ref 11.5–15.5)
WBC: 15.1 10*3/uL — ABNORMAL HIGH (ref 4.0–10.5)
nRBC: 0 % (ref 0.0–0.2)

## 2021-03-26 LAB — COMPREHENSIVE METABOLIC PANEL
ALT: 100 U/L — ABNORMAL HIGH (ref 0–44)
AST: 70 U/L — ABNORMAL HIGH (ref 15–41)
Albumin: 2.3 g/dL — ABNORMAL LOW (ref 3.5–5.0)
Alkaline Phosphatase: 55 U/L (ref 38–126)
Anion gap: 5 (ref 5–15)
BUN: 48 mg/dL — ABNORMAL HIGH (ref 8–23)
CO2: 22 mmol/L (ref 22–32)
Calcium: 8.3 mg/dL — ABNORMAL LOW (ref 8.9–10.3)
Chloride: 103 mmol/L (ref 98–111)
Creatinine, Ser: 1.28 mg/dL — ABNORMAL HIGH (ref 0.61–1.24)
GFR, Estimated: 58 mL/min — ABNORMAL LOW (ref >60)
Glucose, Bld: 174 mg/dL — ABNORMAL HIGH (ref 70–99)
Potassium: 4.4 mmol/L (ref 3.5–5.1)
Sodium: 130 mmol/L — ABNORMAL LOW (ref 135–145)
Total Bilirubin: 0.6 mg/dL (ref 0.3–1.2)
Total Protein: 5.7 g/dL — ABNORMAL LOW (ref 6.5–8.1)

## 2021-03-26 LAB — GLUCOSE, CAPILLARY
Glucose-Capillary: 169 mg/dL — ABNORMAL HIGH (ref 70–99)
Glucose-Capillary: 170 mg/dL — ABNORMAL HIGH (ref 70–99)
Glucose-Capillary: 271 mg/dL — ABNORMAL HIGH (ref 70–99)

## 2021-03-26 MED ORDER — CEFDINIR 300 MG PO CAPS: 300.0000 mg | ORAL_CAPSULE | Freq: Two times a day (BID) | ORAL | 0 refills | Status: AC

## 2021-03-26 MED ORDER — LISINOPRIL 20 MG PO TABS
20.0000 mg | ORAL_TABLET | Freq: Every day | ORAL | Status: DC
  Administered 2021-03-26: 20 mg via ORAL
  Filled 2021-03-26: qty 1

## 2021-03-26 NOTE — Telephone Encounter (Signed)
He will be cared for by the house physician at [facility] and they will order and follow labs.   He is supposed be follow up with a urologist for UTI. It looks like Dr. Glori Luis saw him in the hospital so we can place a referral for follow up with him unless they already have an appointment with [facility] urology.   They also recommended referral to neurology for confusion and neurosurgery for lumbar disk disease. We can go ahead and place make those referrals if it's not been done yet.   We'll see him here for follow up after he is discharged from [facility].

## 2021-03-26 NOTE — Progress Notes (Signed)
Patient discharged to [facility] via [facility] accompanied by wife.  Report previously called to [place] at [facility]. IV site discontinue.  All of patient's belongings sent with wife. Patient transported via stretcher, no acute distress noted. Care relinquished.

## 2021-03-26 NOTE — Discharge Summary (Signed)
Physician Discharge Summary  Norman Palmer EXH:[id number] DOB: 1941/08/11 DOA: 03/17/2021  PCP: Birdie Sons, MD  Admit date: 03/17/2021 Discharge date: 03/26/2021  Admitted From: Home Disposition: SNF  Recommendations for Outpatient Follow-up:  1. Follow up with PCP in 1 week 2. Follow up with neurology and neurosurgery 3. Please obtain BMP/CBC in one week 4. Please follow up on the following pending results: None  Discharge Condition: Stable CODE STATUS: Full code Diet recommendation: Heart healthy/Carb modified   Brief/Interim Summary:  Admission HPI written by Elwyn Reach, MD   HPI: Norman Palmer is a 80 y.o. 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  Patient lives with the wife and has been more aggressive lately.  Is also been weak and fatigued.  His confusion has gotten worse.  Patient went was mobile able to communicate a walk around but lately he has been unable to do that.  He has minimal assistant at baseline now.  He has had number of falls at home patient also had decreased appetite.  Patient has had prior history of UTIs.  In the ER again patient noted to have another episode of possible UTI.  Is being admitted for further evaluation and treatment.Marland Kitchen   Hospital course:  UTI Setting of underlying BPH.  Patient empirically treated with ceftriaxone IV urine culture is significant for E. coli, sensitive to ceftriaxone. Repeat urine culture with no growth.  Acute metabolic encephalopathy Delirium Dementia Patient's mental status changes appear to be likely multifactorial in setting of UTI in addition to underlying dementia.  Patient is on risperidone as an outpatient which was initially restarted and now discontinued. Discontinue risperidone as an outpatient. Continue Aricept and Zoloft.  Leukocytosis Unsure of  etiology. Tmax of 100.6 F on 4/2. Procalcitonin only mildly elevated. Empiric Vancomycin/Cefepime IV initiated. Normal lactic acid. Appears to have reached peak. Urine culture with no growth. Blood culture with no growth to date. Unsure of etiology but leukocytosis improving prior to discharge. Possibly a red herring. Transitioned to Cefdinir on discharge. Check CBC in 3-5 days.  Weakness Generalized in setting of infection. Also has some focal RLE weakness greater than left. History of lumbar spine pathology. No other focal findings. Now with urinary retention, but this is in setting of known urethral stricture. MRI brain without stroke. Lumbar spine with significant lumbar pathology but nothing acute. Neurosurgery consulted and recommend conservative management at this time with possible decompression surgery considered at a later time.  Diabetes mellitus, type II Peripheral neuropathy Patient is on NPH 30 units every evening in addition to glipizide and Metformin as an outpatient.  Patient started on sliding scale insulin only while inpatient. Levemir added as well. Continue home regimen on discharge. Glipizide-metformin discontinued.  Acute urinary retention Patient with a history of BPH and urethral stricture.  Complicated by UTI.  Bladder scan performed by nursing and was significant for a postvoid residual of 631 mL. Urology consulted on 3/30 and foley catheter was placed with recommendations for outpatient voiding trial in 1 week versus if at SNF, removal of foley at that time.  Cervical spine lesion Unknown significance at this time but concern for possible metastasis; history of basal cell carcinoma. Recommendation for repeat MRI cervical spine in 3-6 months.  Hyperlipidemia Continue dutasteride. Neurology recommending to discontinue simvastatin secondary to concern for statin-induced myopathy  Hyponatremia Secondary to poor oral intake/dehydration.  Given IV fluids and sodium  tablets with improvement. Should continue to improve with continued good oral intake.  Primary hypertension Held triamterene, hydrochlorothiazide, lisinopril secondary to infection. Resumed lisinopril but discontinued hydrochlorothiazide/triamterene on discharge secondary to hyponatremia. May elect to resume when sodium stabilizes, however would recommend alternative antihypertensives if blood pressure remains uncontrolled.  CKD stage IIIb Stable.  BPH Continue Flomax  Discharge Diagnoses:  Principal Problem:   AMS (altered mental status) Active Problems:   BPH with obstruction/lower urinary tract symptoms   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[place])   Essential hypertension   Hyperlipemia   Obstructive sleep apnea   Acute lower UTI    Discharge Instructions   Allergies as of 03/26/2021      Reactions   Doxycycline Rash   Zinc Rash      Medication List    STOP taking these medications   celecoxib 200 MG capsule Commonly known as: CELEBREX   glipiZIDE-metformin 2.5-500 MG tablet Commonly known as: METAGLIP   Potassium 99 MG Tabs   risperiDONE 0.25 MG tablet Commonly known as: RISPERDAL   simvastatin 40 MG tablet Commonly known as: ZOCOR   triamterene-hydrochlorothiazide 37.5-25 MG capsule Commonly known as: DYAZIDE     TAKE these medications   cefdinir 300 MG capsule Commonly known as: OMNICEF Take 1 capsule (300 mg total) by mouth 2 (two) times daily for 4 days.   donepezil 10 MG tablet Commonly known as: ARICEPT Take 10 mg by mouth at bedtime.   dutasteride 0.5 MG capsule Commonly known as: AVODART TAKE 1 CAPSULE BY MOUTH ONCE DAILY   insulin NPH Human 100 UNIT/ML injection Commonly known as: NOVOLIN N Inject 30 Units into the skin every evening.   lisinopril 20 MG tablet Commonly known as: ZESTRIL TAKE 1 TABLET BY MOUTH DAILY   metFORMIN 1000 MG tablet Commonly known as: GLUCOPHAGE TAKE ONE TABLET TWICE DAILY WITH MEALS What changed:  See the new instructions.   sertraline 50 MG tablet Commonly known as: ZOLOFT Take 50 mg by mouth daily.   tamsulosin 0.4 MG Caps capsule Commonly known as: FLOMAX Take 1 capsule (0.4 mg total) by mouth daily.   Vitamin D (Ergocalciferol) 1.25 MG (50000 UNIT) Caps capsule Commonly known as: DRISDOL TAKE 1 CAPSULE BY MOUTH ONCE A WEEK            Durable Medical Equipment  (From admission, onward)         Start     Ordered   03/22/21 1235  For home use only DME Hospital bed  Once       Comments: Hoyer lift  Question Answer Comment  Length of Need 6 Months   The above medical condition requires: Patient requires the ability to reposition frequently   Bed type Semi-electric   Hoyer Lift Yes   Support Surface: Gel Overlay      03/22/21 1234   03/22/21 1211  For home use only DME 3 n 1  Once        03/22/21 1211          Contact information for follow-up providers    Birdie Sons, MD. Schedule an appointment as soon as possible for a visit in 1 week(s).   Specialty: Family Medicine Why: Hospital follow-up Contact information: [address] [place] [address] [phone]        Blanche East, MD. Schedule an appointment as soon as possible for a visit in 2 week(s).   Specialty: Neurosurgery Why: Hospital  follow-up Contact information: [place] [address] [phone]        [place]. Schedule an appointment as soon as possible for a visit in 1 week(s).   Why: Weakness, dementia, delirium Contact information: [place] [phone]           Contact information for after-discharge care    Destination    [facility] .   Service: Skilled Nursing Contact information: [place] [address] [phone]                 Allergies  Allergen Reactions  . Doxycycline  Rash  . Zinc Rash    Consultations:  Neurology  Neurosurgery   Procedures/Studies: CT Head Wo Contrast  Result Date: 03/17/2021 CLINICAL DATA:  Delirium, multiple falls EXAM: CT HEAD WITHOUT CONTRAST TECHNIQUE: Contiguous axial images were obtained from the base of the skull through the vertex without intravenous contrast. COMPARISON:  None. FINDINGS: Brain: No evidence of acute infarction, hemorrhage, hydrocephalus, extra-axial collection or mass lesion/mass effect. Mild-moderate low-density changes within the periventricular and subcortical white matter compatible with chronic microvascular ischemic change. Mild-moderate diffuse cerebral volume loss. Vascular: Atherosclerotic calcifications involving the large vessels of the skull base. No unexpected hyperdense vessel. Skull: Normal. Negative for fracture or focal lesion. Sinuses/Orbits: No acute finding. Other: Ill-defined scalp hematoma in the posterior right high parietal region. IMPRESSION: 1. No acute intracranial findings. 2. Ill-defined scalp hematoma in the posterior right high parietal region. No underlying calvarial fracture. 3. Mild-moderate chronic microvascular ischemic change and cerebral volume loss. Electronically Signed   By: Davina Poke D.O.   On: 03/17/2021 20:06   CT Lumbar Spine Wo Contrast  Result Date: 03/17/2021 CLINICAL DATA:  Low back pain with multiple falls EXAM: CT LUMBAR SPINE WITHOUT CONTRAST TECHNIQUE: Multidetector CT imaging of the lumbar spine was performed without intravenous contrast administration. Multiplanar CT image reconstructions were also generated. COMPARISON:  None. FINDINGS: Segmentation: Standard Alignment: Grade 1 anterolisthesis at L4-5 Vertebrae: No acute fracture or focal pathologic process. Paraspinal and other soft tissues: Calcific aortic atherosclerosis. Disc levels: L1-2: Mild facet hypertrophy.  No spinal canal stenosis. L2-3: Moderate facet hypertrophy.  Mild spinal canal stenosis.  L3-4: Severe facet hypertrophy and small disc bulge. Moderate spinal canal stenosis. Moderate bilateral foraminal stenosis. L4-5: Severe facet hypertrophy with small disc bulge. Mild spinal canal stenosis. Mild right and moderate left foraminal stenosis. L5-S1: Severe facet hypertrophy with small disc bulge. No spinal canal stenosis. Moderate left foraminal stenosis. IMPRESSION: 1. No acute fracture of the lumbar spine. 2. Grade 1 L4-5 anterolisthesis due to severe facet arthrosis, which may be a source of local low back pain. 3. 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 4. Moderate multilevel neural foraminal stenosis. Aortic Atherosclerosis (ICD10-I70.0). Electronically Signed   By: Ulyses Jarred M.D.   On: 03/17/2021 20:27   MR BRAIN WO CONTRAST  Result Date: 03/21/2021 CLINICAL DATA:  Altered mental status EXAM: MRI HEAD WITHOUT CONTRAST TECHNIQUE: Multiplanar, multiecho pulse sequences of the brain and surrounding structures were obtained without intravenous contrast. COMPARISON:  None. FINDINGS: Brain: There is no acute infarction or intracranial hemorrhage. There is no intracranial mass, mass effect, or edema. There is no hydrocephalus or extra-axial fluid collection. Prominence of the ventricles and sulci reflects generalized parenchymal volume loss. Patchy and confluent areas of T2 hyperintensity in the supratentorial and  pontine white matter are nonspecific but may reflect mild to moderate chronic microvascular ischemic changes. Vascular: Major vessel flow voids at the skull base are preserved. Skull and upper cervical spine: Normal marrow signal is preserved. Sinuses/Orbits: Minor mucosal thickening.  Orbits are unremarkable. Other: Sella is unremarkable.  Mastoid air cells are clear. IMPRESSION: No evidence of recent infarction, hemorrhage, or mass. Mild to moderate chronic microvascular ischemic changes. Electronically Signed   By: Macy Mis  M.D.   On: 03/21/2021 14:06   MR CERVICAL SPINE WO CONTRAST  Result Date: 03/23/2021 CLINICAL DATA:  Agitation and weakness. History of dementia and Parkinson's disease. Multiple falls. Cervical radiculopathy and mid back pain. EXAM: MRI CERVICAL AND THORACIC SPINE WITHOUT CONTRAST TECHNIQUE: Multiplanar and multiecho pulse sequences of the cervical spine, to include the craniocervical junction and cervicothoracic junction, and the thoracic spine, were obtained without intravenous contrast. COMPARISON:  Lumbar MRI 03/21/2021.  CT lumbar spine 03/17/2021. FINDINGS: MRI CERVICAL SPINE FINDINGS Alignment: Straightening without focal angulation or significant listhesis. Vertebrae: There is an indeterminate well-circumscribed lesion centrally in the C4 vertebral body which measures 12 mm in diameter. This demonstrates low T1 and high T2 signal. No other suspicious osseous lesions. No evidence of acute fracture. There is probable ankylosis of the facet joints on the right at C2-3 and C3-4. Cord: The CSF surrounding the cord is partially effaced, but there is no cord deformity or abnormal cord signal. Posterior Fossa, vertebral arteries, paraspinal tissues: The visualized posterior fossa appears unremarkable. There are bilateral vertebral artery flow voids. No paraspinal abnormalities are seen. Disc levels: C2-3: The disc appears normal. The right facet joint appears ankylosed. No spinal stenosis or nerve root encroachment. C3-4: Mild disc bulging and uncinate spurring. The right facet joint appears ankylosed. No cord deformity or nerve root encroachment. C4-5: Spondylosis with posterior osteophytes covering diffusely bulging disc material. There is asymmetric uncinate spurring on the left contributing to effacement of the CSF surrounding the cord. There is no cord deformity. Moderate foraminal narrowing bilaterally. C5-6: Spondylosis with posterior osteophytes covering diffusely bulging disc material. The CSF  surrounding the cord is effaced without cord deformity. Moderate foraminal narrowing bilaterally. C6-7: Spondylosis with posterior osteophytes covering diffusely bulging disc material. The CSF surrounding the cord is effaced without cord deformity. Mild right and moderate left foraminal narrowing. C7-T1: Mild disc bulging and facet hypertrophy. No cord deformity or significant foraminal compromise. MRI THORACIC SPINE FINDINGS Alignment:  Normal. Vertebrae: No evidence of acute fracture or suspicious marrow lesion. A lesion centrally in the T5 vertebral body demonstrates areas of high T1 and T2 signal, most consistent with a hemangioma. Cord: Normal in signal and caliber. The conus medullaris extends to the L1 level. Paraspinal and other soft tissues: No paraspinal abnormalities. Disc levels: There is mild disc bulging and endplate osteophyte formation throughout the thoracic spine. There is spurring at the costovertebral junctions. No large disc herniation or significant spinal stenosis demonstrated. IMPRESSION: 1. No acute findings are identified within the cervicothoracic spine. 2. 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 The CSF surrounding the cord is partially effaced from C4-5 through C6-7 without associated cord deformity or abnormal cord signal. Moderate foraminal narrowing is present at these levels. 3. No significant spinal stenosis or nerve root encroachment in the thoracic spine. 4. Indeterminate T2 hyperintense lesion in the C4 vertebral body. This could reflect a metastasis, but no other suspicious marrow lesions are identified. Suggest follow-up MRI of the cervical spine without  and with contrast in 3-6 months. Electronically Signed   By: Richardean Sale M.D.   On: 03/23/2021 18:08   MR THORACIC SPINE WO CONTRAST  Result Date: 03/23/2021 CLINICAL DATA:  Agitation and weakness. History of dementia and Parkinson's disease. Multiple  falls. Cervical radiculopathy and mid back pain. EXAM: MRI CERVICAL AND THORACIC SPINE WITHOUT CONTRAST TECHNIQUE: Multiplanar and multiecho pulse sequences of the cervical spine, to include the craniocervical junction and cervicothoracic junction, and the thoracic spine, were obtained without intravenous contrast. COMPARISON:  Lumbar MRI 03/21/2021.  CT lumbar spine 03/17/2021. FINDINGS: MRI CERVICAL SPINE FINDINGS Alignment: Straightening without focal angulation or significant listhesis. Vertebrae: There is an indeterminate well-circumscribed lesion centrally in the C4 vertebral body which measures 12 mm in diameter. This demonstrates low T1 and high T2 signal. No other suspicious osseous lesions. No evidence of acute fracture. There is probable ankylosis of the facet joints on the right at C2-3 and C3-4. Cord: The CSF surrounding the cord is partially effaced, but there is no cord deformity or abnormal cord signal. Posterior Fossa, vertebral arteries, paraspinal tissues: The visualized posterior fossa appears unremarkable. There are bilateral vertebral artery flow voids. No paraspinal abnormalities are seen. Disc levels: C2-3: The disc appears normal. The right facet joint appears ankylosed. No spinal stenosis or nerve root encroachment. C3-4: Mild disc bulging and uncinate spurring. The right facet joint appears ankylosed. No cord deformity or nerve root encroachment. C4-5: Spondylosis with posterior osteophytes covering diffusely bulging disc material. There is asymmetric uncinate spurring on the left contributing to effacement of the CSF surrounding the cord. There is no cord deformity. Moderate foraminal narrowing bilaterally. C5-6: Spondylosis with posterior osteophytes covering diffusely bulging disc material. The CSF surrounding the cord is effaced without cord deformity. Moderate foraminal narrowing bilaterally. C6-7: Spondylosis with posterior osteophytes covering diffusely bulging disc material. The  CSF surrounding the cord is effaced without cord deformity. Mild right and moderate left foraminal narrowing. C7-T1: Mild disc bulging and facet hypertrophy. No cord deformity or significant foraminal compromise. MRI THORACIC SPINE FINDINGS Alignment:  Normal. Vertebrae: No evidence of acute fracture or suspicious marrow lesion. A lesion centrally in the T5 vertebral body demonstrates areas of high T1 and T2 signal, most consistent with a hemangioma. Cord: Normal in signal and caliber. The conus medullaris extends to the L1 level. Paraspinal and other soft tissues: No paraspinal abnormalities. Disc levels: There is mild disc bulging and endplate osteophyte formation throughout the thoracic spine. There is spurring at the costovertebral junctions. No large disc herniation or significant spinal stenosis demonstrated. IMPRESSION: 1. No acute findings are identified within the cervicothoracic spine. 2. 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 The CSF surrounding the cord is partially effaced from C4-5 through C6-7 without associated cord deformity or abnormal cord signal. Moderate foraminal narrowing is present at these levels. 3. No significant spinal stenosis or nerve root encroachment in the thoracic spine. 4. Indeterminate T2 hyperintense lesion in the C4 vertebral body. This could reflect a metastasis, but no other suspicious marrow lesions are identified. Suggest follow-up MRI of the cervical spine without and with contrast in 3-6 months. Electronically Signed   By: Richardean Sale M.D.   On: 03/23/2021 18:08   MR LUMBAR SPINE WO CONTRAST  Result Date: 03/21/2021 CLINICAL DATA:  Agitation and weakness in a patient with a history of dementia. Multiple falls and low back pain. EXAM: MRI LUMBAR SPINE WITHOUT CONTRAST TECHNIQUE: Multiplanar, multisequence MR imaging of the  lumbar spine was performed. No intravenous contrast was administered.  COMPARISON:  CT of the lumbar spine 03/17/2021. FINDINGS: Segmentation:  Standard. Alignment: Trace anterolisthesis L3 on L4 and 0.6 cm anterolisthesis L4 on L5. Vertebrae: No fracture, evidence of discitis, or bone lesion. Short pedicle length results in some congenital narrowing of the central canal, worst at L2-3 and L3-4. Conus medullaris and cauda equina: Conus extends to the T12-L1 level. Conus and cauda equina appear normal. Paraspinal and other soft tissues: Negative. Disc levels: T11-12 is imaged in the sagittal plane only and negative. T12-L1: Shallow down turning central and right paracentral protrusion. Mild central canal narrowing. Foramina are open. L1-2: Shallow disc bulge, moderate facet arthropathy and mild ligamentum flavum thickening. The central canal and foramina are open. L2-3: Broad-based disc bulge, moderate facet arthropathy and ligamentum flavum thickening. There is moderately severe central canal stenosis and narrowing of both subarticular recesses. Mild to moderate foraminal narrowing is worse on the left. L3-4: Advanced facet degenerative change and bulky ligamentum flavum thickening. The disc is uncovered with a shallow bulge. There is severe central canal stenosis and mild bilateral foraminal narrowing. L4-5: Advanced facet degenerative disease. The facets are ankylosed. The disc is uncovered with a minimal bulge. The central canal and right foramen are open. Mild left foraminal narrowing. L5-S1: Moderate to moderately severe facet degenerative change is worse on the right. Shallow disc bulge. No stenosis. IMPRESSION: No acute abnormality. 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 Severe central canal and bilateral subarticular recess narrowing at L3-4. Moderately severe central canal and bilateral subarticular recess narrowing L2-3. Mild to moderate foraminal narrowing at L2-3 is worse on the left. Electronically Signed   By: Inge Rise M.D.   On:  03/21/2021 15:14   DG Chest Port 1 View  Result Date: 03/25/2021 CLINICAL DATA:  Leukocytosis, altered mental status, fall, weakness, diabetes mellitus EXAM: PORTABLE CHEST 1 VIEW COMPARISON:  Portable exam 1115 hours compared to 03/17/2021 FINDINGS: Borderline enlargement of cardiac silhouette. Mediastinal contours and pulmonary vascularity normal. Lungs clear. No acute infiltrate, pleural effusion, or pneumothorax. No acute osseous findings. IMPRESSION: No acute abnormalities. Electronically Signed   By: Lavonia Dana M.D.   On: 03/25/2021 13:37   DG Chest Portable 1 View  Result Date: 03/17/2021 CLINICAL DATA:  Weakness and shortness of breath.  Multiple falls. EXAM: PORTABLE CHEST 1 VIEW COMPARISON:  Most recent radiograph 11/26/2019 FINDINGS: Lung volumes are low. Stable borderline cardiomegaly. Unchanged mediastinal contours. No pulmonary edema, acute airspace disease, large pleural effusion or pneumothorax. Degenerative change of both shoulders. No acute osseous abnormalities are seen. IMPRESSION: Low lung volumes without acute abnormality. Electronically Signed   By: Keith Rake M.D.   On: 03/17/2021 18:58      Subjective: No concerns overnight.   Discharge Exam: Vitals:   03/26/21 0442 03/26/21 0824  BP: (!) 170/56 (!) 169/49  Pulse: 69 71  Resp: 20 20  Temp: 98.2 F (36.8 C) 98.3 F (36.8 C)  SpO2: 97% 93%   Vitals:   03/25/21 1639 03/25/21 2011 03/26/21 0442 03/26/21 0824  BP: (!) 160/62 (!) 155/48 (!) 170/56 (!) 169/49  Pulse: 78 73 69 71  Resp: (!) 24 20 20 20   Temp: 99.8 F (37.7 C) 98.7 F (37.1 C) 98.2 F (36.8 C) 98.3 F (36.8 C)  TempSrc:  Oral Oral Oral  SpO2: 96% 95% 97% 93%  Weight:      Height:        General: Pt is alert,  awake, not in acute distress Cardiovascular: RRR, S1/S2 +, no rubs, no gallops Respiratory: CTA bilaterally, no wheezing, no rhonchi Abdominal: Soft, NT, ND, bowel sounds + Extremities: BLE edema, no cyanosis Psych: Flat  affect    The results of significant diagnostics from this hospitalization (including imaging, microbiology, ancillary and laboratory) are listed below for reference.     Microbiology: Recent Results (from the past 240 hour(s))  Urine culture     Status: Abnormal   Collection Time: 03/17/21  7:42 PM   Specimen: Urine, Random  Result Value Ref Range Status   Specimen Description   Final    URINE, RANDOM Performed at [facility], [address] [place], [place] [address]    Special Requests   Final    NONE Performed at [facility], [place], [place] [address]    Culture >=100,000 COLONIES/mL ESCHERICHIA COLI (A)  Final   Report Status 03/20/2021 FINAL  Final   Organism ID, Bacteria ESCHERICHIA COLI (A)  Final      Susceptibility   Escherichia coli - MIC*    AMPICILLIN >=32 RESISTANT Resistant     CEFAZOLIN >=64 RESISTANT Resistant     CEFEPIME <=0.12 SENSITIVE Sensitive     CEFTRIAXONE <=0.25 SENSITIVE Sensitive     CIPROFLOXACIN >=4 RESISTANT Resistant     GENTAMICIN <=1 SENSITIVE Sensitive     IMIPENEM <=0.25 SENSITIVE Sensitive     NITROFURANTOIN <=16 SENSITIVE Sensitive     TRIMETH/SULFA <=20 SENSITIVE Sensitive     AMPICILLIN/SULBACTAM >=32 RESISTANT Resistant     PIP/TAZO <=4 SENSITIVE Sensitive     * >=100,000 COLONIES/mL ESCHERICHIA COLI  Resp Panel by RT-PCR (Flu A&B, Covid) Nasopharyngeal Swab     Status: None   Collection Time: 03/17/21  8:49 PM   Specimen: Nasopharyngeal Swab; Nasopharyngeal(NP) swabs in vial transport medium  Result Value Ref Range Status   SARS Coronavirus 2 by RT PCR NEGATIVE NEGATIVE Final    Comment: (NOTE) SARS-CoV-2 target nucleic acids are NOT DETECTED.  The SARS-CoV-2 RNA is generally detectable in upper respiratory specimens during the acute phase of infection. The lowest concentration of SARS-CoV-2 viral copies this assay can detect is 138 copies/mL. A negative result does not preclude  SARS-Cov-2 infection and should not be used as the sole basis for treatment or other patient management decisions. A negative result may occur with  improper specimen collection/handling, submission of specimen other than nasopharyngeal swab, presence of viral mutation(s) within the areas targeted by this assay, and inadequate number of viral copies(<138 copies/mL). A negative result must be combined with clinical observations, patient history, and epidemiological information. The expected result is Negative.  Fact Sheet for Patients:  EntrepreneurPulse.com.au  Fact Sheet for Healthcare Providers:  IncredibleEmployment.be  This test is no t yet approved or cleared by the [place] FDA and  has been authorized for detection and/or diagnosis of SARS-CoV-2 by FDA under an Emergency Use Authorization (EUA). This EUA will remain  in effect (meaning this test can be used) for the duration of the COVID-19 declaration under Section 564(b)(1) of the Act, 21 U.S.C.section 360bbb-3(b)(1), unless the authorization is terminated  or revoked sooner.       Influenza A by PCR NEGATIVE NEGATIVE Final   Influenza B by PCR NEGATIVE NEGATIVE Final    Comment: (NOTE) The Xpert Xpress SARS-CoV-2/FLU/RSV plus assay is intended as an aid in the diagnosis of influenza from Nasopharyngeal swab specimens and should not be used as a sole basis  for treatment. Nasal washings and aspirates are unacceptable for Xpert Xpress SARS-CoV-2/FLU/RSV testing.  Fact Sheet for Patients: EntrepreneurPulse.com.au  Fact Sheet for Healthcare Providers: IncredibleEmployment.be  This test is not yet approved or cleared by the [place] FDA and has been authorized for detection and/or diagnosis of SARS-CoV-2 by FDA under an Emergency Use Authorization (EUA). This EUA will remain in effect (meaning this test can be used) for the duration of  the COVID-19 declaration under Section 564(b)(1) of the Act, 21 U.S.C. section 360bbb-3(b)(1), unless the authorization is terminated or revoked.  Performed at [facility], [address], [place], [place] [address]   Urine Culture     Status: None   Collection Time: 03/23/21  1:26 PM   Specimen: Urine, Random  Result Value Ref Range Status   Specimen Description   Final    URINE, RANDOM Performed at [facility], [address] [place], [place] [address]    Special Requests   Final    NONE Performed at [facility], [address], [place], [place] [address]    Culture   Final    NO GROWTH Performed at [facility], [place] [address], [place], [place] [address]    Report Status 03/25/2021 FINAL  Final  CULTURE, BLOOD (ROUTINE X 2) w Reflex to ID Panel     Status: None (Preliminary result)   Collection Time: 03/23/21  1:47 PM   Specimen: BLOOD  Result Value Ref Range Status   Specimen Description BLOOD RIGHT ANTECUBITAL  Final   Special Requests   Final    BOTTLES DRAWN AEROBIC AND ANAEROBIC Blood Culture adequate volume   Culture   Final    NO GROWTH 3 DAYS Performed at [facility], [address], [place], [place] [address]    Report Status PENDING  Incomplete  CULTURE, BLOOD (ROUTINE X 2) w Reflex to ID Panel     Status: None (Preliminary result)   Collection Time: 03/23/21  1:54 PM   Specimen: BLOOD  Result Value Ref Range Status   Specimen Description BLOOD LEFT ANTECUBITAL  Final   Special Requests   Final    BOTTLES DRAWN AEROBIC AND ANAEROBIC Blood Culture adequate volume   Culture   Final    NO GROWTH 3 DAYS Performed at [facility], [address] [place],  [address]    Report Status PENDING  Incomplete  SARS CORONAVIRUS 2 (TAT 6-24 HRS)     Status: None   Collection Time: 03/25/21  1:18 AM  Result Value Ref Range Status   SARS Coronavirus 2 NEGATIVE NEGATIVE Final    Comment:  (NOTE) SARS-CoV-2 target nucleic acids are NOT DETECTED.  The SARS-CoV-2 RNA is generally detectable in upper and lower respiratory specimens during the acute phase of infection. Negative results do not preclude SARS-CoV-2 infection, do not rule out co-infections with other pathogens, and should not be used as the sole basis for treatment or other patient management decisions. Negative results must be combined with clinical observations, patient history, and epidemiological information. The expected result is Negative.  Fact Sheet for Patients: SugarRoll.be  Fact Sheet for Healthcare Providers: https://www.woods-mathews.com/  This test is not yet approved or cleared by the [place] FDA and  has been authorized for detection and/or diagnosis of SARS-CoV-2 by FDA under an Emergency Use Authorization (EUA). This EUA will remain  in effect (meaning this test can be used) for the duration of the COVID-19 declaration under Se ction  564(b)(1) of the Act, 21 U.S.C. section 360bbb-3(b)(1), unless the authorization is terminated or revoked sooner.  Performed at [facility], [place] [address], [place], [place] [address]      Labs: BNP (last 3 results) Recent Labs    08/13/20 1405  BNP 16.9   Basic Metabolic Panel: Recent Labs  Lab 03/20/21 1002 03/23/21 0529 03/24/21 0616 03/25/21 0458 03/26/21 0541  NA 136 129* 128* 128* 130*  K 4.6 4.5 4.7 4.4 4.4  CL 105 98 98 102 103  CO2 22 22 21* 20* 22  GLUCOSE 211* 214* 222* 175* 174*  BUN 39* 51* 65* 61* 48*  CREATININE 1.45* 1.36* 1.61* 1.52* 1.28*  CALCIUM 8.9 8.4* 7.9* 8.0* 8.3*   Liver Function Tests: Recent Labs  Lab 03/24/21 0616 03/25/21 0458 03/26/21 0541  AST 197* 104* 70*  ALT 121* 103* 100*  ALKPHOS 60 63 55  BILITOT 0.6 0.6 0.6  PROT 6.2* 6.0* 5.7*  ALBUMIN 2.7* 2.5* 2.3*   No results for input(s): LIPASE, AMYLASE in the last 168 hours. No results for  input(s): AMMONIA in the last 168 hours. CBC: Recent Labs  Lab 03/23/21 0529 03/24/21 0616 03/25/21 0458 03/26/21 0541  WBC 17.0* 17.1* 17.0* 15.1*  HGB 11.0* 10.2* 9.4* 9.6*  HCT 33.1* 31.7* 28.7* 29.7*  MCV 91.2 92.4 92.0 91.1  PLT 333 318 331 330   Cardiac Enzymes: Recent Labs  Lab 03/25/21 2047  CKTOTAL 1,096*   BNP: Invalid input(s): POCBNP CBG: Recent Labs  Lab 03/25/21 1634 03/25/21 2016 03/26/21 0743 03/26/21 0814 03/26/21 1043  GLUCAP 200* 196* 169* 170* 271*   D-Dimer No results for input(s): DDIMER in the last 72 hours. Hgb A1c No results for input(s): HGBA1C in the last 72 hours. Lipid Profile No results for input(s): CHOL, HDL, LDLCALC, TRIG, CHOLHDL, LDLDIRECT in the last 72 hours. Thyroid function studies No results for input(s): TSH, T4TOTAL, T3FREE, THYROIDAB in the last 72 hours.  Invalid input(s): FREET3 Anemia work up No results for input(s): VITAMINB12, FOLATE, FERRITIN, TIBC, IRON, RETICCTPCT in the last 72 hours. Urinalysis    Component Value Date/Time   COLORURINE YELLOW (A) 03/17/2021 1942   APPEARANCEUR CLEAR (A) 03/17/2021 1942   LABSPEC 1.015 03/17/2021 1942   PHURINE 5.0 03/17/2021 1942   GLUCOSEU NEGATIVE 03/17/2021 1942   HGBUR NEGATIVE 03/17/2021 1942   BILIRUBINUR NEGATIVE 03/17/2021 1942   BILIRUBINUR Neg 10/13/2017 1126   KETONESUR NEGATIVE 03/17/2021 1942   PROTEINUR NEGATIVE 03/17/2021 1942   UROBILINOGEN 0.2 10/13/2017 1126   NITRITE POSITIVE (A) 03/17/2021 1942   LEUKOCYTESUR NEGATIVE 03/17/2021 1942   Sepsis Labs Invalid input(s): PROCALCITONIN,  WBC,  LACTICIDVEN Microbiology Recent Results (from the past 240 hour(s))  Urine culture     Status: Abnormal   Collection Time: 03/17/21  7:42 PM   Specimen: Urine, Random  Result Value Ref Range Status   Specimen Description   Final    URINE, RANDOM Performed at [facility], [address], [place], [place] [address]    Special Requests   Final     NONE Performed at [facility], [address] [place], [place] [address]    Culture >=100,000 COLONIES/mL ESCHERICHIA COLI (A)  Final   Report Status 03/20/2021 FINAL  Final   Organism ID, Bacteria ESCHERICHIA COLI (A)  Final      Susceptibility   Escherichia coli - MIC*    AMPICILLIN >=32 RESISTANT Resistant     CEFAZOLIN >=64 RESISTANT Resistant  CEFEPIME <=0.12 SENSITIVE Sensitive     CEFTRIAXONE <=0.25 SENSITIVE Sensitive     CIPROFLOXACIN >=4 RESISTANT Resistant     GENTAMICIN <=1 SENSITIVE Sensitive     IMIPENEM <=0.25 SENSITIVE Sensitive     NITROFURANTOIN <=16 SENSITIVE Sensitive     TRIMETH/SULFA <=20 SENSITIVE Sensitive     AMPICILLIN/SULBACTAM >=32 RESISTANT Resistant     PIP/TAZO <=4 SENSITIVE Sensitive     * >=100,000 COLONIES/mL ESCHERICHIA COLI  Resp Panel by RT-PCR (Flu A&B, Covid) Nasopharyngeal Swab     Status: None   Collection Time: 03/17/21  8:49 PM   Specimen: Nasopharyngeal Swab; Nasopharyngeal(NP) swabs in vial transport medium  Result Value Ref Range Status   SARS Coronavirus 2 by RT PCR NEGATIVE NEGATIVE Final    Comment: (NOTE) SARS-CoV-2 target nucleic acids are NOT DETECTED.  The SARS-CoV-2 RNA is generally detectable in upper respiratory specimens during the acute phase of infection. The lowest concentration of SARS-CoV-2 viral copies this assay can detect is 138 copies/mL. A negative result does not preclude SARS-Cov-2 infection and should not be used as the sole basis for treatment or other patient management decisions. A negative result may occur with  improper specimen collection/handling, submission of specimen other than nasopharyngeal swab, presence of viral mutation(s) within the areas targeted by this assay, and inadequate number of viral copies(<138 copies/mL). A negative result must be combined with clinical observations, patient history, and epidemiological information. The expected result is Negative.  Fact Sheet for  Patients:  EntrepreneurPulse.com.au  Fact Sheet for Healthcare Providers:  IncredibleEmployment.be  This test is no t yet approved or cleared by the [place] FDA and  has been authorized for detection and/or diagnosis of SARS-CoV-2 by FDA under an Emergency Use Authorization (EUA). This EUA will remain  in effect (meaning this test can be used) for the duration of the COVID-19 declaration under Section 564(b)(1) of the Act, 21 U.S.C.section 360bbb-3(b)(1), unless the authorization is terminated  or revoked sooner.       Influenza A by PCR NEGATIVE NEGATIVE Final   Influenza B by PCR NEGATIVE NEGATIVE Final    Comment: (NOTE) The Xpert Xpress SARS-CoV-2/FLU/RSV plus assay is intended as an aid in the diagnosis of influenza from Nasopharyngeal swab specimens and should not be used as a sole basis for treatment. Nasal washings and aspirates are unacceptable for Xpert Xpress SARS-CoV-2/FLU/RSV testing.  Fact Sheet for Patients: EntrepreneurPulse.com.au  Fact Sheet for Healthcare Providers: IncredibleEmployment.be  This test is not yet approved or cleared by the [place] FDA and has been authorized for detection and/or diagnosis of SARS-CoV-2 by FDA under an Emergency Use Authorization (EUA). This EUA will remain in effect (meaning this test can be used) for the duration of the COVID-19 declaration under Section 564(b)(1) of the Act, 21 U.S.C. section 360bbb-3(b)(1), unless the authorization is terminated or revoked.  Performed at [facility], [address], [place], [place] [address]   Urine Culture     Status: None   Collection Time: 03/23/21  1:26 PM   Specimen: Urine, Random  Result Value Ref Range Status   Specimen Description   Final    URINE, RANDOM Performed at [facility], [address], [place], [place] [address]    Special Requests   Final    NONE Performed  at [facility], [address] [place], [place] [address]    Culture   Final    NO GROWTH Performed at [facility], [place]  [address], [place], [place] [address]    Report Status 03/25/2021 FINAL  Final  CULTURE, BLOOD (ROUTINE X 2) w Reflex to ID Panel     Status: None (Preliminary result)   Collection Time: 03/23/21  1:47 PM   Specimen: BLOOD  Result Value Ref Range Status   Specimen Description BLOOD RIGHT ANTECUBITAL  Final   Special Requests   Final    BOTTLES DRAWN AEROBIC AND ANAEROBIC Blood Culture adequate volume   Culture   Final    NO GROWTH 3 DAYS Performed at [facility], [address] [place], [place] [address]    Report Status PENDING  Incomplete  CULTURE, BLOOD (ROUTINE X 2) w Reflex to ID Panel     Status: None (Preliminary result)   Collection Time: 03/23/21  1:54 PM   Specimen: BLOOD  Result Value Ref Range Status   Specimen Description BLOOD LEFT ANTECUBITAL  Final   Special Requests   Final    BOTTLES DRAWN AEROBIC AND ANAEROBIC Blood Culture adequate volume   Culture   Final    NO GROWTH 3 DAYS Performed at [facility], [address], [place], [place] [address]    Report Status PENDING  Incomplete  SARS CORONAVIRUS 2 (TAT 6-24 HRS)     Status: None   Collection Time: 03/25/21  1:18 AM  Result Value Ref Range Status   SARS Coronavirus 2 NEGATIVE NEGATIVE Final    Comment: (NOTE) SARS-CoV-2 target nucleic acids are NOT DETECTED.  The SARS-CoV-2 RNA is generally detectable in upper and lower respiratory specimens during the acute phase of infection. Negative results do not preclude SARS-CoV-2 infection, do not rule out co-infections with other pathogens, and should not be used as the sole basis for treatment or other patient management decisions. Negative results must be combined with clinical observations, patient history, and epidemiological information. The expected result is Negative.  Fact Sheet for  Patients: SugarRoll.be  Fact Sheet for Healthcare Providers: https://www.woods-mathews.com/  This test is not yet approved or cleared by the [place] FDA and  has been authorized for detection and/or diagnosis of SARS-CoV-2 by FDA under an Emergency Use Authorization (EUA). This EUA will remain  in effect (meaning this test can be used) for the duration of the COVID-19 declaration under Se ction 564(b)(1) of the Act, 21 U.S.C. section 360bbb-3(b)(1), unless the authorization is terminated or revoked sooner.  Performed at [facility], [place] [address] [place], [place] [address]      Time coordinating discharge: 35 minutes  SIGNED:   Cordelia Poche, MD [facility] 03/26/2021, 12:22 PM

## 2021-03-26 NOTE — Discharge Instructions (Signed)
[name],  You were in the hospital with weakness and confusion, found to have a likely UTI. While you were here, you had imaging that showed some spinal cord issues for which you should follow-up with the neurosurgeon. Your confusion waxes and wanes and may be related to your dementia with some acute delirium either from your situation or from your infection. You will need to follow-up with the neurologist for this issue in addition to your overall weakness issue. Thankfully, you have not had a stroke. One of your MRI scans showed a spot in your cervical spine area that was abnormal, with recommendations for a repeat MRI scan of your cervical spine in 3-6 months to make sure it isn't something like cancer.

## 2021-03-26 NOTE — Telephone Encounter (Signed)
Norman Palmer has been advised and referrals placed. KW

## 2021-03-26 NOTE — TOC Transition Note (Signed)
Transition of Care [facility]) - CM/SW Discharge Note   Patient Details  Name: Norman Palmer MRN: [id number] Date of Birth: 1941/09/05  [facility] CM/SW Contact:  Candie Chroman, LCSW Phone Number: 03/26/2021, 1:02 PM   Clinical Narrative:  Patient has orders to discharge to [facility] today. RN will call report to [phone] (Room 314). EMS transport has been arranged and he is 2nd on the list. No further concerns. CSW signing off.   Final next level of care: Skilled Nursing Facility Barriers to Discharge: Barriers Resolved   Patient Goals and CMS Choice   CMS Medicare.gov Compare Post Acute Care list provided to:: Patient (Wife at bedside) Choice offered to / list presented to : [place]  Discharge Placement PASRR number recieved: 03/19/21            Patient chooses bed at: [facility] Patient to be transferred to facility by: EMS Name of family member notified: Brandn Mcgath Patient and family notified of of transfer: 03/26/21  Discharge Plan and Services     Post Acute Care Choice: [place]                               Social Determinants of Health (SDOH) Interventions     Readmission Risk Interventions No flowsheet data found.

## 2021-03-27 ENCOUNTER — Telehealth: Payer: Self-pay

## 2021-03-27 LAB — MYOGLOBIN, SERUM: Myoglobin: 373 ng/mL — ABNORMAL HIGH (ref 28–72)

## 2021-03-27 NOTE — Telephone Encounter (Signed)
  Patient was recently discharged from the hospital on 03/26/21.  No TCM completed, patient does not qualify for TCM services due to being d/c to a SNF.  Per discharge summary patient needs follow up with PCP. No HFU scheduled at this time. FYI!

## 2021-03-28 DIAGNOSIS — F0150 Vascular dementia without behavioral disturbance: Secondary | ICD-10-CM | POA: Diagnosis not present

## 2021-03-28 DIAGNOSIS — M5416 Radiculopathy, lumbar region: Secondary | ICD-10-CM | POA: Diagnosis not present

## 2021-03-28 DIAGNOSIS — E1159 Type 2 diabetes mellitus with other circulatory complications: Secondary | ICD-10-CM | POA: Diagnosis not present

## 2021-03-28 DIAGNOSIS — R531 Weakness: Secondary | ICD-10-CM | POA: Diagnosis not present

## 2021-03-28 DIAGNOSIS — I10 Essential (primary) hypertension: Secondary | ICD-10-CM | POA: Diagnosis not present

## 2021-03-28 DIAGNOSIS — N1831 Chronic kidney disease, stage 3a: Secondary | ICD-10-CM | POA: Diagnosis not present

## 2021-03-28 LAB — CULTURE, BLOOD (ROUTINE X 2)
Culture: NO GROWTH
Culture: NO GROWTH
Special Requests: ADEQUATE
Special Requests: ADEQUATE

## 2021-03-31 ENCOUNTER — Encounter: Payer: Self-pay | Admitting: Neurology

## 2021-03-31 NOTE — Progress Notes (Signed)
The patient's myoblobin came back elevated at 373. His CK was elevated at 1096.  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 although other factors were felt to be contributing to his weakness,  including lumbar spinal stenosis. PCP Dr. Caryn Section notified about the results. The patient may need to have a CK and myoglobin level redrawn to assess for possible improvement off of his statin. If no improvement, then may need muscle biopsy.   Electronically signed: Dr. Kerney Elbe

## 2021-04-15 DIAGNOSIS — L0390 Cellulitis, unspecified: Secondary | ICD-10-CM | POA: Diagnosis not present

## 2021-04-15 DIAGNOSIS — F39 Unspecified mood [affective] disorder: Secondary | ICD-10-CM | POA: Diagnosis not present

## 2021-04-15 DIAGNOSIS — L98411 Non-pressure chronic ulcer of buttock limited to breakdown of skin: Secondary | ICD-10-CM | POA: Diagnosis not present

## 2021-04-22 ENCOUNTER — Telehealth: Payer: Self-pay | Admitting: Family Medicine
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
## 2021-04-22 NOTE — Telephone Encounter (Signed)
Home Health - Caller/Agency: Dewana from [facility]  Callback Number:  [phone] ext [phone]  Patient family decides to wait until 05/01/2021 to start home Physical Therapy

## 2021-04-23 ENCOUNTER — Ambulatory Visit: Payer: Medicare Other | Admitting: Urology

## 2021-04-23 ENCOUNTER — Ambulatory Visit (INDEPENDENT_AMBULATORY_CARE_PROVIDER_SITE_OTHER): Payer: Medicare Other | Admitting: Urology

## 2021-04-23 ENCOUNTER — Other Ambulatory Visit: Payer: Self-pay

## 2021-04-23 ENCOUNTER — Encounter: Payer: Self-pay | Admitting: Urology

## 2021-04-23 DIAGNOSIS — R338 Other retention of urine: Secondary | ICD-10-CM | POA: Diagnosis not present

## 2021-04-23 DIAGNOSIS — N390 Urinary tract infection, site not specified: Secondary | ICD-10-CM

## 2021-04-23 LAB — URINALYSIS, COMPLETE
Bilirubin, UA: NEGATIVE
Glucose, UA: NEGATIVE
Ketones, UA: NEGATIVE
Nitrite, UA: POSITIVE — AB
Specific Gravity, UA: 1.015 (ref 1.005–1.030)
Urobilinogen, Ur: 0.2 mg/dL (ref 0.2–1.0)
pH, UA: 5.5 (ref 5.0–7.5)

## 2021-04-23 LAB — MICROSCOPIC EXAMINATION
Epithelial Cells (non renal): NONE SEEN /hpf (ref 0–10)
WBC, UA: >30 /hpf — AB (ref 0–5)

## 2021-04-23 LAB — BLADDER SCAN AMB NON-IMAGING: Scan Result: 525

## 2021-04-23 MED ORDER — SULFAMETHOXAZOLE-TRIMETHOPRIM 800-160 MG PO TABS: 1.0000 | ORAL_TABLET | Freq: Two times a day (BID) | ORAL | 0 refills | Status: DC

## 2021-04-25 DIAGNOSIS — N1832 Chronic kidney disease, stage 3b: Secondary | ICD-10-CM | POA: Diagnosis not present

## 2021-04-25 DIAGNOSIS — F39 Unspecified mood [affective] disorder: Secondary | ICD-10-CM | POA: Diagnosis not present

## 2021-04-25 DIAGNOSIS — E1159 Type 2 diabetes mellitus with other circulatory complications: Secondary | ICD-10-CM | POA: Diagnosis not present

## 2021-04-25 DIAGNOSIS — M48061 Spinal stenosis, lumbar region without neurogenic claudication: Secondary | ICD-10-CM | POA: Diagnosis not present

## 2021-04-25 DIAGNOSIS — F0390 Unspecified dementia without behavioral disturbance: Secondary | ICD-10-CM | POA: Diagnosis not present

## 2021-04-25 DIAGNOSIS — I10 Essential (primary) hypertension: Secondary | ICD-10-CM | POA: Diagnosis not present

## 2021-04-26 DIAGNOSIS — G9341 Metabolic encephalopathy: Secondary | ICD-10-CM | POA: Diagnosis not present

## 2021-04-26 DIAGNOSIS — R278 Other lack of coordination: Secondary | ICD-10-CM | POA: Diagnosis not present

## 2021-04-26 DIAGNOSIS — M6281 Muscle weakness (generalized): Secondary | ICD-10-CM | POA: Diagnosis not present

## 2021-04-26 DIAGNOSIS — N390 Urinary tract infection, site not specified: Secondary | ICD-10-CM | POA: Diagnosis not present

## 2021-04-26 DIAGNOSIS — N1832 Chronic kidney disease, stage 3b: Secondary | ICD-10-CM | POA: Diagnosis not present

## 2021-04-27 LAB — CULTURE, URINE COMPREHENSIVE

## 2021-04-29 ENCOUNTER — Telehealth: Payer: Self-pay

## 2021-04-29 DIAGNOSIS — L89153 Pressure ulcer of sacral region, stage 3: Secondary | ICD-10-CM | POA: Diagnosis not present

## 2021-04-29 DIAGNOSIS — L03312 Cellulitis of back [any part except buttock]: Secondary | ICD-10-CM | POA: Diagnosis not present

## 2021-04-29 NOTE — Telephone Encounter (Signed)
-----   Message from Nori Riis, PA-C sent at 04/29/2021  8:48 AM EDT ----- Please let Norman Palmer wife know that his urine was positive for infection and that the Septra DS is the appropriate antibiotic.

## 2021-04-29 NOTE — Telephone Encounter (Signed)
mychart notification sent Patient's wife notified per [facility]

## 2021-04-30 ENCOUNTER — Telehealth: Payer: Self-pay

## 2021-04-30 DIAGNOSIS — L89302 Pressure ulcer of unspecified buttock, stage 2: Secondary | ICD-10-CM

## 2021-04-30 DIAGNOSIS — Z7409 Other reduced mobility: Secondary | ICD-10-CM

## 2021-04-30 DIAGNOSIS — F0391 Unspecified dementia with behavioral disturbance: Secondary | ICD-10-CM

## 2021-04-30 NOTE — Telephone Encounter (Signed)
I called patient and patient verbalized understanding of information below.   

## 2021-04-30 NOTE — Progress Notes (Signed)
05/01/2021 3:16 PM   Norman Palmer 05/20/1941 [id number]  Referring provider: Birdie Sons, MD [address] [place],  [place] [address]  Chief Complaint  Patient presents with  . Urinary Retention   Urological history: 1. Urethral stricture disease -followed by Dr. Wynetta Emery at [facility] -underwent cystoscopy and urethral calibration with injection of steroids in 2016 and 2018 and has most recently been managed with CIC. Was using straight cath, not coude tip. He admits he does not CIC due to discomfort and really wants to avoid if at all possible -Last CIC was 08/2018 but this was recommended once a week  2. BPH  -managed with tamsulosin 0.4 mg daily and dutasteride 0.5 mg daily    HPI: Norman Palmer is a 80 y.o. male who presents today for a PVR after daughter removed catheter this morning with his wife.  Urine culture from last visit was positive for multi-resistant E. Coli treated with appropriate antibiotics.  His wife states that the urinary tract infection has resolved.  His PVR today is 2 mL.  The wife states that he is completely incontinence, but he has been feeling his depends with urine.  Patient denies any modifying or aggravating factors.  Patient denies any gross hematuria, dysuria or suprapubic/flank pain.  Patient denies any fevers, chills, nausea or vomiting.    PMH: Past Medical History:  Diagnosis Date  . Alzheimer disease ([place])   . Dementia ([place])   . Diabetes mellitus without complication ([place])   . H/O knee surgery 04/17/2015   Overview:  Bilateral knees surgery   . History of basal cell carcinoma (BCC) of skin 08/12/2019   MOHS Right alar crease [facility] Dr. Manley Mason 07/2019  . Infection with methicillin-resistant Staphylococcus aureus 08/22/2015  . Insomnia   . Lumbago   . Sciatica   . Thyroid disease     Surgical History: Past Surgical History:  Procedure Laterality Date  . knee surgery Bilateral 2000  . MOHS SURGERY Right  08/11/2019   BCC alar crease- [facility] Dr. Manley Mason  . SKIN GRAFT Bilateral 1955   legs  . URETHRAL STRICTURE DILATATION      Home Medications:  Allergies as of 05/01/2021      Reactions   Doxycycline Rash   Zinc Rash      Medication List       Accurate as of May 01, 2021  3:16 PM. If you have any questions, ask your nurse or doctor.        donepezil 10 MG tablet Commonly known as: ARICEPT Take 10 mg by mouth at bedtime.   dutasteride 0.5 MG capsule Commonly known as: AVODART TAKE 1 CAPSULE BY MOUTH ONCE DAILY   haloperidol 2 MG tablet Commonly known as: HALDOL Take 1 tablet (2 mg total) by mouth every 6 (six) hours as needed for agitation.   insulin NPH Human 100 UNIT/ML injection Commonly known as: NOVOLIN N Inject 30 Units into the skin every evening.   lisinopril 20 MG tablet Commonly known as: ZESTRIL TAKE 1 TABLET BY MOUTH DAILY   metFORMIN 1000 MG tablet Commonly known as: GLUCOPHAGE TAKE ONE TABLET TWICE DAILY WITH MEALS What changed: See the new instructions.   sertraline 50 MG tablet Commonly known as: ZOLOFT Take 50 mg by mouth daily.   sulfamethoxazole-trimethoprim 800-160 MG tablet Commonly known as: BACTRIM DS Take 1 tablet by mouth every 12 (twelve) hours.   tamsulosin 0.4 MG Caps capsule Commonly known as: FLOMAX Take 1 capsule (  0.4 mg total) by mouth daily.   Vitamin D (Ergocalciferol) 1.25 MG (50000 UNIT) Caps capsule Commonly known as: DRISDOL TAKE 1 CAPSULE BY MOUTH ONCE A WEEK       Allergies:  Allergies  Allergen Reactions  . Doxycycline Rash  . Zinc Rash    Family History: Family History  Problem Relation Age of Onset  . Hypertension Father   . Cancer Brother        Liver Cancer    Social History:  reports that he has quit smoking. His smoking use included cigarettes. He has a 60.00 pack-year smoking history. He has quit using smokeless tobacco. He reports current alcohol use of about 1.0 - 2.0 standard drink of alcohol  per week. He reports that he does not use drugs.  ROS: Pertinent ROS in HPI  Physical Exam: Blood pressure (!) 90/56, pulse (!) 121, height 6' (1.829 m), weight 286 lb (129.7 kg). Constitutional:  Well nourished. Alert and oriented, No acute distress. HEENT: [place] AT, mask in place.  Trachea midline Cardiovascular: No clubbing, cyanosis, or edema. Respiratory: Normal respiratory effort, no increased work of breathing. Neurologic: Grossly intact, no focal deficits, moving all 4 extremities. Psychiatric: Normal mood and affect.   Laboratory Data: No new labs since last visit    Pertinent Imaging: Results for AKIL, HOOS" (MRN [id number]) as of 05/01/2021 15:15  Ref. Range 05/01/2021 14:55  Scan Result Unknown 43mL     Assessment & Plan:    1. Urinary retention -foley catheter removed this morning -PVR is minimal   2. rUTI's -resolved  Return if symptoms worsen or fail to improve.  These notes generated with voice recognition software. I apologize for typographical errors.  Zara Council, PA-C  [facility] [address]  [place], [place] [address] [phone]

## 2021-04-30 NOTE — Addendum Note (Signed)
Addended by: Birdie Sons on: 04/30/2021 01:45 PM   Modules accepted: Orders

## 2021-04-30 NOTE — Telephone Encounter (Signed)
Have placed order, they should hear from Martin within a day or two.

## 2021-04-30 NOTE — Telephone Encounter (Signed)
Copied from [place] [id number]. Topic: General - Inquiry >> Apr 30, 2021  9:25 AM Norman Palmer D wrote: Reason for CRM: Pt's wife called saying she is taking her husband home today [facility] and he has a bed sore on his right buttocks and Dr. Silvio Pate debreded it yesterday.  Wife is asking if Dr. Caryn Section will get them a wound care nurse to come out to the home to take care of it.  Wife states it is a bad bed sore and will need care but her husband is not walking an it is too hard to get him out.  CB#  [phone]

## 2021-05-01 ENCOUNTER — Encounter: Payer: Self-pay | Admitting: Urology

## 2021-05-01 ENCOUNTER — Other Ambulatory Visit: Payer: Self-pay

## 2021-05-01 ENCOUNTER — Telehealth: Payer: Self-pay

## 2021-05-01 ENCOUNTER — Ambulatory Visit (INDEPENDENT_AMBULATORY_CARE_PROVIDER_SITE_OTHER): Payer: Medicare Other | Admitting: Urology

## 2021-05-01 VITALS — BP 90/56 | HR 121 | Ht 72.0 in | Wt 286.0 lb

## 2021-05-01 DIAGNOSIS — N390 Urinary tract infection, site not specified: Secondary | ICD-10-CM | POA: Diagnosis not present

## 2021-05-01 DIAGNOSIS — R339 Retention of urine, unspecified: Secondary | ICD-10-CM

## 2021-05-01 LAB — BLADDER SCAN AMB NON-IMAGING

## 2021-05-01 MED ORDER — HALOPERIDOL 2 MG PO TABS: 2.0000 mg | ORAL_TABLET | Freq: Four times a day (QID) | ORAL | 1 refills | Status: DC | PRN

## 2021-05-01 MED ORDER — SILVER SULFADIAZINE 1 % EX CREA: 1.0000 "application " | TOPICAL_CREAM | Freq: Every day | CUTANEOUS | 0 refills | Status: DC

## 2021-05-01 MED ORDER — SILVER SULFADIAZINE 1 % EX CREA: 1.0000 "application " | TOPICAL_CREAM | Freq: Every day | CUTANEOUS | 1 refills | Status: DC

## 2021-05-01 NOTE — Addendum Note (Signed)
Addended by: Julieta Bellini on: 05/01/2021 05:04 PM   Modules accepted: Orders

## 2021-05-01 NOTE — Telephone Encounter (Signed)
fCopied from [place] [id number]. Topic: General - Other >> May 01, 2021  4:10 PM Tessa Lerner A wrote: Reason for CRM: Patient('s daughter) is requesting a prescription for Silvadene be sent to their preferred pharmacy  Patient's daughter shares that the patient's wife discussed the medication with staff member "Helyn App" today 05/01/21  Please contact to further advise if needed

## 2021-05-01 NOTE — Telephone Encounter (Signed)
Rx for silvadene sent to pharmacy, per Dr. Rosanna Randy. Patient's daughter was advise.
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
## 2021-05-01 NOTE — Addendum Note (Signed)
Addended by: Birdie Sons on: 05/01/2021 07:35 PM   Modules accepted: Orders

## 2021-05-01 NOTE — Telephone Encounter (Signed)
Have sent prescription for haloperidol to [facility] pharmacy. Home Health nurse should be out to look at wound in next day or 2. I

## 2021-05-01 NOTE — Addendum Note (Signed)
Addended by: Birdie Sons on: 05/01/2021 12:37 PM   Modules accepted: Orders
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
## 2021-05-02 NOTE — Telephone Encounter (Signed)
Done in another encounter

## 2021-05-03 ENCOUNTER — Encounter: Payer: Self-pay | Admitting: Family Medicine

## 2021-05-07 ENCOUNTER — Encounter: Payer: Self-pay | Admitting: Family Medicine

## 2021-05-07 ENCOUNTER — Ambulatory Visit (INDEPENDENT_AMBULATORY_CARE_PROVIDER_SITE_OTHER): Payer: Medicare Other | Admitting: Family Medicine

## 2021-05-07 ENCOUNTER — Other Ambulatory Visit: Payer: Self-pay

## 2021-05-07 VITALS — BP 117/69 | HR 102 | Temp 97.7°F | Resp 18

## 2021-05-07 DIAGNOSIS — Z7409 Other reduced mobility: Secondary | ICD-10-CM | POA: Diagnosis not present

## 2021-05-07 DIAGNOSIS — E559 Vitamin D deficiency, unspecified: Secondary | ICD-10-CM | POA: Diagnosis not present

## 2021-05-07 DIAGNOSIS — F0391 Unspecified dementia with behavioral disturbance: Secondary | ICD-10-CM

## 2021-05-07 DIAGNOSIS — N401 Enlarged prostate with lower urinary tract symptoms: Secondary | ICD-10-CM | POA: Diagnosis not present

## 2021-05-07 DIAGNOSIS — F03918 Unspecified dementia, unspecified severity, with other behavioral disturbance: Secondary | ICD-10-CM | POA: Insufficient documentation

## 2021-05-07 DIAGNOSIS — N138 Other obstructive and reflux uropathy: Secondary | ICD-10-CM

## 2021-05-07 DIAGNOSIS — N390 Urinary tract infection, site not specified: Secondary | ICD-10-CM | POA: Diagnosis not present

## 2021-05-07 DIAGNOSIS — I10 Essential (primary) hypertension: Secondary | ICD-10-CM | POA: Diagnosis not present

## 2021-05-07 DIAGNOSIS — L89303 Pressure ulcer of unspecified buttock, stage 3: Secondary | ICD-10-CM | POA: Diagnosis not present

## 2021-05-07 DIAGNOSIS — E871 Hypo-osmolality and hyponatremia: Secondary | ICD-10-CM

## 2021-05-07 DIAGNOSIS — L89153 Pressure ulcer of sacral region, stage 3: Secondary | ICD-10-CM | POA: Insufficient documentation

## 2021-05-07 MED ORDER — CEPHALEXIN 250 MG PO CAPS: 250.0000 mg | ORAL_CAPSULE | Freq: Every day | ORAL | 5 refills | Status: DC

## 2021-05-07 MED ORDER — SILVER SULFADIAZINE 1 % EX CREA: 1.0000 "application " | TOPICAL_CREAM | Freq: Every day | CUTANEOUS | 2 refills | Status: DC

## 2021-05-07 MED ORDER — MEMANTINE HCL 5 MG PO TABS: 5.0000 mg | ORAL_TABLET | Freq: Two times a day (BID) | ORAL | 0 refills | Status: DC
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
## 2021-05-07 NOTE — Patient Instructions (Addendum)
.   Restart memantine by taking one tablet twice a day for 7 days, then increase to 2 tablets twice a day. I'll sent a prescription for 10mg  tablets before the 5mg  tablets run out.   You can continue triamterine-hctz for now, but if the sodium levels are low, we'll have to put in on hold again.

## 2021-05-08 LAB — COMPREHENSIVE METABOLIC PANEL
ALT: 18 IU/L (ref 0–44)
AST: 16 IU/L (ref 0–40)
Albumin/Globulin Ratio: 1.5 (ref 1.2–2.2)
Albumin: 4.2 g/dL (ref 3.7–4.7)
Alkaline Phosphatase: 96 IU/L (ref 44–121)
BUN/Creatinine Ratio: 22 (ref 10–24)
BUN: 30 mg/dL — ABNORMAL HIGH (ref 8–27)
Bilirubin Total: <0.2 mg/dL (ref 0.0–1.2)
CO2: 19 mmol/L — ABNORMAL LOW (ref 20–29)
Calcium: 9.6 mg/dL (ref 8.6–10.2)
Chloride: 97 mmol/L (ref 96–106)
Creatinine, Ser: 1.36 mg/dL — ABNORMAL HIGH (ref 0.76–1.27)
Globulin, Total: 2.8 g/dL (ref 1.5–4.5)
Glucose: 144 mg/dL — ABNORMAL HIGH (ref 65–99)
Potassium: 5.1 mmol/L (ref 3.5–5.2)
Sodium: 136 mmol/L (ref 134–144)
Total Protein: 7 g/dL (ref 6.0–8.5)
eGFR: 54 mL/min/{1.73_m2} — ABNORMAL LOW (ref >59)

## 2021-05-08 LAB — CBC
Hematocrit: 38.5 % (ref 37.5–51.0)
Hemoglobin: 12.3 g/dL — ABNORMAL LOW (ref 13.0–17.7)
MCH: 28.8 pg (ref 26.6–33.0)
MCHC: 31.9 g/dL (ref 31.5–35.7)
MCV: 90 fL (ref 79–97)
Platelets: 437 10*3/uL (ref 150–450)
RBC: 4.27 x10E6/uL (ref 4.14–5.80)
RDW: 12.1 % (ref 11.6–15.4)
WBC: 16.1 10*3/uL — ABNORMAL HIGH (ref 3.4–10.8)

## 2021-05-08 LAB — TSH: TSH: 1.53 u[IU]/mL (ref 0.450–4.500)

## 2021-05-08 LAB — VITAMIN D 25 HYDROXY (VIT D DEFICIENCY, FRACTURES): Vit D, 25-Hydroxy: 29.8 ng/mL — ABNORMAL LOW (ref 30.0–100.0)

## 2021-05-09 ENCOUNTER — Encounter: Payer: Self-pay | Admitting: Family Medicine

## 2021-05-10 ENCOUNTER — Telehealth: Payer: Self-pay | Admitting: Family Medicine

## 2021-05-10 NOTE — Telephone Encounter (Signed)
Yes, that is what the haloperidol was prescribed for. He was here earlier this week and is much calmer.

## 2021-05-10 NOTE — Telephone Encounter (Signed)
Norman Palmer called and stated that no home health or wound care nurse has been in contact with her or the Pt / please advise asap

## 2021-05-13 ENCOUNTER — Encounter: Payer: Self-pay | Admitting: Family Medicine

## 2021-05-13 NOTE — Telephone Encounter (Signed)
Patient's wife is calling again because she still has not gotten a nurse for wound care for her husband.  She stated no one has contacted them.  Please advise.

## 2021-05-13 NOTE — Telephone Encounter (Signed)
Please call sarah c and tell her this patient needs wound care ASAP and it's been almost two weeks. She needs to find another agency if they can't get someone out there.

## 2021-05-14 ENCOUNTER — Telehealth: Payer: Self-pay | Admitting: Adult Health Nurse Practitioner

## 2021-05-14 ENCOUNTER — Telehealth: Payer: Self-pay

## 2021-05-14 DIAGNOSIS — L89303 Pressure ulcer of unspecified buttock, stage 3: Secondary | ICD-10-CM

## 2021-05-14 DIAGNOSIS — F0391 Unspecified dementia with behavioral disturbance: Secondary | ICD-10-CM

## 2021-05-14 MED ORDER — ZINC OXIDE 20 % EX OINT: 1.0000 "application " | TOPICAL_OINTMENT | CUTANEOUS | 4 refills | Status: AC | PRN

## 2021-05-14 NOTE — Telephone Encounter (Signed)
I called and spoke with Judson Roch and advised her of STAT orders that have been placed by Dr. Caryn Section. Judson Roch states that [facility] continues to say tell her that they would not come back out to patients home until the patient was being treated with medication to keep him calm. I advised Judson Roch that Dr. Caryn Section had already prescribed Haloperidol for patient over a week ago and this was already communicated to [facility] last week. Per patients wife, [facility] has not reached out to patients family about when they would come back. Per Carey Bullocks continues to advise her that our office needs to make sure patient takes a calming agent before they come back out for an assessment.  Per Carey Bullocks plans to call patient's home tomorrow. I called and spoke with patients wife. She states that she does not want to use [facility] because the last person that came out to their home was rude to her. Patients wife also feels that patient needs [facility] palliative [facility] care. Her daughter is a Marine scientist and feels that he needs this level of care. Patient's wife states that patient is not ambulatory, he isn't urinating as much and his bed sores aren't getting any better.  Dr. Caryn Section has placed an order for palliative Care. I called and spoke with Sarah in referrals. She is working on the STAT palliative care referral.

## 2021-05-14 NOTE — Telephone Encounter (Signed)
Palliative care order has been placed. Patient has been waiting two weeks for home health to start treatment of his pressure ulcer. Order has also been placed to initiate home health with [facility] since [facility] has not responded to order. Please process these referral order Stat. Needs to be seen within 24 hours.

## 2021-05-14 NOTE — Telephone Encounter (Signed)
Spoke with patient's wife, Santiago Glad, regarding Palliative referral/services and all questions were answered and she was in agreement with starting services.  I have scheduled an In-home Consult for 05/15/21 @ 8:30 AM.

## 2021-05-14 NOTE — Telephone Encounter (Signed)
Stat order has been placed for palliative care and home health wound care with [facility]. Please call sarah to make sure she has received referral orders. Order has been written for Oscillating mattress. They can pick up or have sent to medical supply store. Prescription for 20% zinc oxide [facility].

## 2021-05-14 NOTE — Telephone Encounter (Signed)
Pts wife called in and said the order needs to be placed to [facility]. Please advise, caller stated the pt really needs a home health aid as soon as possible.

## 2021-05-14 NOTE — Telephone Encounter (Signed)
Copied from [place] [phone]. Topic: Referral - Request for Referral >> May 14, 2021 10:38 AM Erick Blinks wrote: Has patient seen PCP for this complaint? Yes.   *If NO, is insurance requiring patient see PCP for this issue before PCP can refer them? Referral for which specialty: [place]  Preferred provider/office: [facility]  Reason for referral: Pt's daughter called and is requesting this high priority, he is getting more bed sores he is not ambulatory. More groggy/agitated.

## 2021-05-14 NOTE — Telephone Encounter (Signed)
To be fair with [facility] they were planning on going out to pt's home tomorrow 05/15/21 .They were advised of med to calm pt this week after I found out. Since pt's wife does not want them back out I have let them know they are discharged and have spoken to [place] from [facility]. I am still waiting on a call for him to let me know when they will be able to assist. He is aware it is urgent. Per [facility] palliative care will make a visit tomorrow but states they do not do much with wound care

## 2021-05-15 ENCOUNTER — Other Ambulatory Visit: Payer: Medicare Other | Admitting: Adult Health Nurse Practitioner

## 2021-05-15 ENCOUNTER — Encounter: Payer: Self-pay | Admitting: Adult Health Nurse Practitioner

## 2021-05-15 ENCOUNTER — Other Ambulatory Visit: Payer: Self-pay

## 2021-05-15 VITALS — BP 168/80 | HR 91

## 2021-05-15 DIAGNOSIS — Z515 Encounter for palliative care: Secondary | ICD-10-CM

## 2021-05-15 DIAGNOSIS — L89303 Pressure ulcer of unspecified buttock, stage 3: Secondary | ICD-10-CM

## 2021-05-15 DIAGNOSIS — F0391 Unspecified dementia with behavioral disturbance: Secondary | ICD-10-CM
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
## 2021-05-15 NOTE — Telephone Encounter (Signed)
Norman Palmer has been advised. See separate message.

## 2021-05-16 ENCOUNTER — Telehealth: Payer: Self-pay

## 2021-05-16 ENCOUNTER — Telehealth: Payer: Self-pay | Admitting: Family Medicine

## 2021-05-16 NOTE — Telephone Encounter (Signed)
Patient's wife called to request an order for an APP (Air Pressure Combo).  She stated that the order that was put in was the wrong order.  Please call to discuss at [phone]

## 2021-05-16 NOTE — Telephone Encounter (Signed)
Candy from [facility] states they will be able to see pt 05/17/21

## 2021-05-16 NOTE — Telephone Encounter (Signed)
Returned call to patient's wife on behalf of Palliative NP to provide update re: home health and to offer a follow up visit with Amy NP for 05/17/21 @ 1pm

## 2021-05-16 NOTE — Telephone Encounter (Signed)
[facility] has declined referral because they had him in the past and wife would not let them come out as needed. I have called a couple of other companies . No one can see him before Sunday

## 2021-05-16 NOTE — Telephone Encounter (Signed)
Copied from [place] [phone]. Topic: General - Other >> May 16, 2021  1:03 PM Tessa Lerner A wrote: Reason for CRM: Patient's daughter would like a prescription for an oscillating mattress topper sent via fax to   [facility] at [phone]  Please contact to further advise if needed

## 2021-05-16 NOTE — Telephone Encounter (Signed)
Phone call placed to [facility] to inquire if able to pick patient up for wound care. CIndie with [facility] will work on this and follow up

## 2021-05-17 ENCOUNTER — Telehealth: Payer: Self-pay

## 2021-05-17 ENCOUNTER — Telehealth: Payer: Self-pay | Admitting: Adult Health Nurse Practitioner

## 2021-05-17 DIAGNOSIS — E871 Hypo-osmolality and hyponatremia: Secondary | ICD-10-CM | POA: Diagnosis not present

## 2021-05-17 DIAGNOSIS — N401 Enlarged prostate with lower urinary tract symptoms: Secondary | ICD-10-CM | POA: Diagnosis not present

## 2021-05-17 DIAGNOSIS — R262 Difficulty in walking, not elsewhere classified: Secondary | ICD-10-CM | POA: Diagnosis not present

## 2021-05-17 DIAGNOSIS — R339 Retention of urine, unspecified: Secondary | ICD-10-CM | POA: Diagnosis not present

## 2021-05-17 DIAGNOSIS — F0391 Unspecified dementia with behavioral disturbance: Secondary | ICD-10-CM | POA: Diagnosis not present

## 2021-05-17 DIAGNOSIS — L89323 Pressure ulcer of left buttock, stage 3: Secondary | ICD-10-CM | POA: Diagnosis not present

## 2021-05-17 DIAGNOSIS — L89626 Pressure-induced deep tissue damage of left heel: Secondary | ICD-10-CM | POA: Diagnosis not present

## 2021-05-17 DIAGNOSIS — I10 Essential (primary) hypertension: Secondary | ICD-10-CM | POA: Diagnosis not present

## 2021-05-17 NOTE — Telephone Encounter (Signed)
done

## 2021-05-17 NOTE — Telephone Encounter (Signed)
Copied from [place] [phone]. Topic: General - Other >> May 16, 2021  1:03 PM Tessa Lerner A wrote: Reason for CRM: Patient's daughter would like a prescription for an oscillating mattress topper sent via fax to   [facility] at [phone]  Please contact to further advise if needed >> May 16, 2021  4:01 PM Mcneil, Ja-Kwan wrote: Pt wife called for an update on the Rx request for mattress topper. Pt wife stated she will come get the Rx if she needs to because pt will need it before the weekend.

## 2021-05-17 NOTE — Telephone Encounter (Signed)
I called and spoke with patients wife. She states that patient needs an order for an air pressure pad. They already picked up the order that was previously written for oscillating mattress, but they were told that they also need an order for an air pressure pad. She would like order faxed to the medical supply store listed below. Order has been written and placed on Dr. Maralyn Sago desk for review and signature.  Patients wife also wanted to let Dr. Caryn Section know that a home health nurse came out today and did would care.

## 2021-05-17 NOTE — Telephone Encounter (Signed)
This is late entry spoke with wife yesterday 05/16/2021.  Returned wife's voice message that she has not heard from home health RN for wound care.  Have reached out to clinical navigator to help on status of home health RN order.  Spoke with wife later in the day and she states that [facility] had set up a time that they will be there on Friday from 8-10.  Encouraged to call with any further questions or concerns Norman Longstreth K. Olena Heckle, NP

## 2021-05-21 ENCOUNTER — Other Ambulatory Visit: Payer: Self-pay | Admitting: Family Medicine

## 2021-05-21 DIAGNOSIS — F0391 Unspecified dementia with behavioral disturbance: Secondary | ICD-10-CM

## 2021-05-21 MED ORDER — MEMANTINE HCL 10 MG PO TABS: 10.0000 mg | ORAL_TABLET | Freq: Two times a day (BID) | ORAL | 1 refills | Status: DC

## 2021-05-23 ENCOUNTER — Ambulatory Visit: Payer: Self-pay | Admitting: *Deleted

## 2021-05-23 DIAGNOSIS — F03911 Unspecified dementia, unspecified severity, with agitation: Secondary | ICD-10-CM

## 2021-05-23 DIAGNOSIS — F0391 Unspecified dementia with behavioral disturbance: Secondary | ICD-10-CM

## 2021-05-23 DIAGNOSIS — R451 Restlessness and agitation: Secondary | ICD-10-CM
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
## 2021-05-24 ENCOUNTER — Encounter (INDEPENDENT_AMBULATORY_CARE_PROVIDER_SITE_OTHER): Payer: Medicare Other | Admitting: Family Medicine

## 2021-05-24 DIAGNOSIS — R262 Difficulty in walking, not elsewhere classified: Secondary | ICD-10-CM | POA: Diagnosis not present

## 2021-05-24 DIAGNOSIS — I10 Essential (primary) hypertension: Secondary | ICD-10-CM

## 2021-05-24 DIAGNOSIS — F0391 Unspecified dementia with behavioral disturbance: Secondary | ICD-10-CM | POA: Diagnosis not present

## 2021-05-24 DIAGNOSIS — E871 Hypo-osmolality and hyponatremia: Secondary | ICD-10-CM

## 2021-05-24 DIAGNOSIS — L89626 Pressure-induced deep tissue damage of left heel: Secondary | ICD-10-CM | POA: Diagnosis not present

## 2021-05-24 DIAGNOSIS — L89323 Pressure ulcer of left buttock, stage 3: Secondary | ICD-10-CM

## 2021-05-24 DIAGNOSIS — R339 Retention of urine, unspecified: Secondary | ICD-10-CM

## 2021-05-24 DIAGNOSIS — N401 Enlarged prostate with lower urinary tract symptoms: Secondary | ICD-10-CM

## 2021-05-24 MED ORDER — ALPRAZOLAM 1 MG PO TABS: 1.0000 mg | ORAL_TABLET | Freq: Three times a day (TID) | ORAL | 0 refills | Status: DC | PRN

## 2021-05-24 MED ORDER — RISPERIDONE 0.5 MG PO TABS: 0.5000 mg | ORAL_TABLET | Freq: Every day | ORAL | 1 refills | Status: DC

## 2021-05-24 NOTE — Telephone Encounter (Signed)
I sent prescription for Risperdal to take once a day and alprazolam to take as needed to calming him down. He should also have a u/a done, can you see if home health is able to do one or if they can bring a sample in from home?

## 2021-05-24 NOTE — Telephone Encounter (Signed)
[facility] 05/24/2021.  PEC please advise home health nurse when she calls back.   Thanks,   -Mickel Baas

## 2021-05-24 NOTE — Addendum Note (Signed)
Addended by: Birdie Sons on: 05/24/2021 08:16 AM   Modules accepted: Orders

## 2021-05-31 ENCOUNTER — Telehealth: Payer: Self-pay | Admitting: *Deleted

## 2021-05-31 NOTE — Telephone Encounter (Signed)
Order faxed.

## 2021-05-31 NOTE — Telephone Encounter (Signed)
Order written, please fax

## 2021-05-31 NOTE — Telephone Encounter (Signed)
Copied from [place] [phone]. Topic: General - Other >> May 31, 2021  3:25 PM Bayard Beaver wrote: Reason for CRM:pt wife called in regard to getting a bigger bed for her husband, the one they have that was ordered is too small. [facility] bariatric bed from [facility]. [place]

## 2021-06-04 ENCOUNTER — Other Ambulatory Visit: Payer: Self-pay | Admitting: Family Medicine

## 2021-06-04 ENCOUNTER — Telehealth: Payer: Self-pay

## 2021-06-04 DIAGNOSIS — R451 Restlessness and agitation: Secondary | ICD-10-CM

## 2021-06-04 DIAGNOSIS — L89303 Pressure ulcer of unspecified buttock, stage 3: Secondary | ICD-10-CM

## 2021-06-04 NOTE — Telephone Encounter (Signed)
Copied from [place] [phone]. Topic: Quick Communication - Home Health Verbal Orders >> Jun 04, 2021  3:40 PM Loma Boston wrote: Caller/Agency: [facility] Callback Number: [phone] Requesting :To apply hydrogel gauze to wound on left buttock Caller, Norman Palmer  is also wanting a message left to  dr of procedure of referring  this pt to wound center
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
## 2021-06-04 NOTE — Telephone Encounter (Signed)
[place] for requested dressings, have placed referral order to wound care clinic.

## 2021-06-04 NOTE — Addendum Note (Signed)
Addended by: Birdie Sons on: 06/04/2021 05:13 PM   Modules accepted: Orders

## 2021-06-05 NOTE — Telephone Encounter (Signed)
Verbal orders were given. KW

## 2021-06-10 ENCOUNTER — Telehealth: Payer: Self-pay

## 2021-06-10 ENCOUNTER — Encounter: Payer: Medicare Other | Attending: Physician Assistant | Admitting: Physician Assistant

## 2021-06-10 ENCOUNTER — Other Ambulatory Visit: Payer: Self-pay

## 2021-06-10 DIAGNOSIS — N189 Chronic kidney disease, unspecified: Secondary | ICD-10-CM | POA: Diagnosis not present

## 2021-06-10 DIAGNOSIS — E1122 Type 2 diabetes mellitus with diabetic chronic kidney disease: Secondary | ICD-10-CM | POA: Insufficient documentation

## 2021-06-10 DIAGNOSIS — E11622 Type 2 diabetes mellitus with other skin ulcer: Secondary | ICD-10-CM | POA: Insufficient documentation

## 2021-06-10 DIAGNOSIS — L89324 Pressure ulcer of left buttock, stage 4: Secondary | ICD-10-CM | POA: Insufficient documentation

## 2021-06-10 DIAGNOSIS — I129 Hypertensive chronic kidney disease with stage 1 through stage 4 chronic kidney disease, or unspecified chronic kidney disease: Secondary | ICD-10-CM | POA: Diagnosis not present

## 2021-06-10 DIAGNOSIS — R279 Unspecified lack of coordination: Secondary | ICD-10-CM | POA: Diagnosis not present

## 2021-06-10 DIAGNOSIS — R404 Transient alteration of awareness: Secondary | ICD-10-CM | POA: Diagnosis not present

## 2021-06-10 DIAGNOSIS — Z743 Need for continuous supervision: Secondary | ICD-10-CM | POA: Diagnosis not present

## 2021-06-10 NOTE — Progress Notes (Signed)
Norman Palmer ([id number]) Visit Report for 06/10/2021 Abuse/Suicide Risk Screen Details Patient Name: Norman Palmer, [name] Date of Service: 06/10/2021 2:15 PM Medical Record Number: [id number] Patient Account Number: [id number] Date of Birth/Sex: 14-Jan-1941 (80 y.o. Male) Treating RN: Norman Palmer Primary Care Norman Palmer: Norman Palmer Other Clinician: Referring Norman Palmer: Norman Palmer Treating Norman Palmer/Extender: Norman Palmer in Treatment: 0 Abuse/Suicide Risk Screen Items Answer ABUSE RISK SCREEN: Has anyone close to you tried to hurt or harm you recentlyo No Do you feel uncomfortable with anyone in your familyo No Has anyone forced you do things that you didnot want to doo No Electronic Signature(s) Signed: 06/10/2021 4:29:35 PM By: Norman Palmer, Norman Breeding RN Entered By: Norman Palmer, Kenia on 06/10/2021 14:59:53 Norman Palmer ([id number]) -------------------------------------------------------------------------------- Activities of Daily Living Details Patient Name: Norman Palmer, [name] Date of Service: 06/10/2021 2:15 PM Medical Record Number: [id number] Patient Account Number: [id number] Date of Birth/Sex: 11-Jul-1941 (80 y.o. Male) Treating RN: Norman Palmer Primary Care Zen Felling: Norman Palmer Other Clinician: Referring Anaria Kroner: Norman Palmer Treating Norman Palmer/Extender: Norman Palmer in Treatment: 0 Activities of Daily Living Items Answer Activities of Daily Living (Please select one for each item) Drive Automobile Not Able Take Medications Not Able Use Telephone Not Able Care for Appearance Not Able Use Toilet Not Able Manus Rudd / Shower Not Able Dress Self Not Able Feed Self Not Able Walk Not Able Get In / Out Bed Not Able Housework Not Able Prepare Meals Not Able Handle Money Not Able Shop for Self Not Able Electronic Signature(s) Signed: 06/10/2021 4:29:35 PM By: Norman Palmer, Norman Breeding RN Entered By: Norman Palmer, Norman Palmer on 06/10/2021  15:00:13 Norman Palmer ([id number]) -------------------------------------------------------------------------------- Education Screening Details Patient Name: Norman Palmer, [name] Date of Service: 06/10/2021 2:15 PM Medical Record Number: [id number] Patient Account Number: [id number] Date of Birth/Sex: 12-Apr-1941 (80 y.o. Male) Treating RN: Norman Palmer Primary Care Norman Palmer: Norman Palmer Other Clinician: Referring Norman Palmer: Norman Palmer Treating Norman Palmer/Extender: Norman Palmer in Treatment: 0 Primary Learner Assessed: Caregiver Reason Patient is not Primary Learner: dementia Learning Preferences/Education Level/Primary Language Preferred Language: English Cognitive Barrier Memory Deficit: Yes Electronic Signature(s) Signed: 06/10/2021 4:29:35 PM By: Norman Nose RN Entered By: Norman Palmer, Norman Palmer on 06/10/2021 15:00:41 Norman Palmer, Norman Palmer ([id number]) -------------------------------------------------------------------------------- Fall Risk Assessment Details Patient Name: [name] Date of Service: 06/10/2021 2:15 PM Medical Record Number: [id number] Patient Account Number: [id number] Date of Birth/Sex: Nov 30, 1941 (80 y.o. Male) Treating RN: Norman Palmer Primary Care Norman Palmer: Norman Palmer Other Clinician: Referring Norman Palmer: Norman Palmer Treating Norman Palmer/Extender: Norman Palmer in Treatment: 0 Fall Risk Assessment Items Have you had 2 or more falls in the last 12 monthso 0 Yes Have you had any fall that resulted in injury in the last 12 monthso 0 Yes FALLS RISK SCREEN History of falling - immediate or within 3 months 25 Yes Secondary diagnosis (Do you have 2 or more medical diagnoseso) 15 Yes Ambulatory aid None/bed rest/wheelchair/nurse 0 Yes Crutches/cane/walker 0 No Furniture 0 No Intravenous therapy Access/Saline/Heparin Lock 0 No Gait/Transferring Normal/ bed rest/ wheelchair 0 Yes Weak (short steps with or without shuffle,  stooped but able to lift head while walking, may 0 No seek support from furniture) Impaired (short steps with shuffle, may have difficulty arising from chair, head down, impaired 0 No balance) Mental Status Oriented to own ability 0 Yes Electronic Signature(s) Signed: 06/10/2021 4:29:35 PM By: Norman Palmer, Norman Breeding RN Entered By: Norman Palmer, Kenia on 06/10/2021 15:01:02 Norman Palmer ([id number]) -------------------------------------------------------------------------------- Foot Assessment Details Patient Name: Norman Palmer  M. Date of Service: 06/10/2021 2:15 PM Medical Record Number: [id number] Patient Account Number: [id number] Date of Birth/Sex: Jun 06, 1941 (80 y.o. Male) Treating RN: Norman Palmer Primary Care Norman Palmer: Norman Palmer Other Clinician: Referring Norman Palmer: Norman Palmer Treating Norman Palmer/Extender: Norman Palmer in Treatment: 0 Foot Assessment Items [x]  Unable to perform due to altered mental status Site Locations + = Sensation present, - = Sensation absent, Norman = Callus, U = Ulcer R = Redness, W = Warmth, M = Maceration, PU = Pre-ulcerative lesion F = Fissure, S = Swelling, D = Dryness Assessment Right: Left: Other Deformity: No No Prior Foot Ulcer: No No Prior Amputation: No No Charcot Joint: No No Ambulatory Status: Non-ambulatory Assistance Device: Stretcher Gait: Electronic Signature(s) Signed: 06/10/2021 4:29:35 PM By: Norman Palmer, Norman Breeding RN Entered By: Norman Palmer, Norman Palmer on 06/10/2021 15:01:33 Norman Palmer ([id number]) -------------------------------------------------------------------------------- Nutrition Risk Screening Details Patient Name: [name] Date of Service: 06/10/2021 2:15 PM Medical Record Number: [id number] Patient Account Number: [id number] Date of Birth/Sex: 03-10-1941 (80 y.o. Male) Treating RN: Norman Palmer Primary Care Cap Palmer: Norman Palmer Other Clinician: Referring Norman Palmer: Norman Palmer Treating Norman Palmer/Extender: Norman Palmer in Treatment: 0 Height (in): Weight (lbs): Body Mass Index (BMI): Nutrition Risk Screening Items Score Screening NUTRITION RISK SCREEN: I have an illness or condition that made me change the kind and/or amount of food I eat 0 No I eat fewer than two meals per day 0 No I eat few fruits and vegetables, or milk products 0 No I have three or more drinks of beer, liquor or wine almost every day 0 No I have tooth or mouth problems that make it hard for me to eat 0 No I don't always have enough money to buy the food I need 0 No I eat alone most of the time 0 No I take three or more different prescribed or over-the-counter drugs a day 1 Yes Without wanting to, I have lost or gained 10 pounds in the last six months 0 No I am not always physically able to shop, cook and/or feed myself 0 No Nutrition Protocols Good Risk Protocol 0 No interventions needed Moderate Risk Protocol High Risk Proctocol Risk Level: Good Risk Score: 1 Electronic Signature(s) Signed: 06/10/2021 4:29:35 PM By: Norman Palmer, Norman Breeding RN Entered By: Norman Palmer, Norman Palmer on 06/10/2021 15:01:19

## 2021-06-10 NOTE — Progress Notes (Signed)
Norman, Palmer ([id number]) Visit Report for 06/10/2021 Chief Complaint Document Details Patient Name: Norman, Palmer. Date of Service: 06/10/2021 2:15 PM Medical Record Number: [id number] Patient Account Number: [id number] Date of Birth/Sex: 18-Feb-1941 (80 y.o. Male) Treating RN: Donnamarie Poag Primary Care Provider: Lelon Huh Other Clinician: Referring Provider: Lelon Huh Treating Provider/Extender: Skipper Cliche in Treatment: 0 Information Obtained from: Patient Chief Complaint Pressure ulcer left gluteus Electronic Signature(s) Signed: 06/10/2021 3:15:16 PM By: Worthy Keeler PA-C Entered By: Worthy Keeler on 06/10/2021 15:15:16 Doree Fudge ([id number]) -------------------------------------------------------------------------------- HPI Details Patient Name: Norman, Palmer. Date of Service: 06/10/2021 2:15 PM Medical Record Number: [id number] Patient Account Number: [id number] Date of Birth/Sex: Jul 22, 1941 (80 y.o. 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 This occurred when he was actually in a skilled nursing facility his wife tells me at [facility]. She was very upset about this. She recently took him home he unfortunately does have Alzheimer's which seems to be advancing. She notes that currently he seems to be getting worse in regard to his anger and outburst. He consistently during the time that was he was in the office today was calling his wife by explosives but I will put in the chart but nonetheless I do not think this is him she states this is nothing like how he was before the Alzheimer's caused some any changes in him. She  also notes however that he also seems to be worse than normal she is concerned that he may have an issue here with a urinary tract infection is recently had 1 and she feels like that might be returning. The patient is generally weak. He also has a history of diabetes mellitus type 2, Alzheimer's disease and chronic kidney disease although she does not know how severe. Electronic Signature(s) Signed: 06/10/2021 5:36:06 PM By: Worthy Keeler PA-C Entered By: Worthy Keeler on 06/10/2021 17:36:05 Doree Fudge ([id number]) -------------------------------------------------------------------------------- Physical Exam Details Patient Name: Norman, Palmer. Date of Service: 06/10/2021 2:15 PM Medical Record Number: [id number] Patient Account Number: [id number] Date of Birth/Sex: 04/28/1941 (80 y.o. Male) Treating RN: Donnamarie Poag Primary Care Provider: Lelon Huh Other Clinician: Referring Provider: Lelon Huh Treating Provider/Extender: Skipper Cliche in Treatment: 0 Constitutional sitting or standing blood pressure is within target range for patient.. pulse regular and within target range for patient.Marland Kitchen respirations regular, non- labored and within target range for patient.Marland Kitchen temperature within target range for patient.. Well-nourished and well-hydrated in no acute distress. Eyes conjunctiva clear no eyelid edema noted. pupils equal round and reactive to light and accommodation. Ears, Nose, Mouth, and Throat no gross abnormality of ear auricles or external auditory canals. normal hearing noted during conversation. mucus membranes moist. Respiratory normal breathing without difficulty. Musculoskeletal Patient unable to walk without assistance. Psychiatric Patient is not able to cooperate in decision making regarding care. Patient has dementia. patient is agitated. Notes Upon inspection patient's wound bed actually showed signs of good granulation epithelization at this point.  There does not appear to be any evidence of active infection which is great news. With that being said they have been using Silvadene followed by hydrogel which I really think is probably not the best way to go. I think the Dakin's  moistened gauze dressing would probably be better for the patient. That was discussed with the patient's wife today. Electronic Signature(s) Signed: 06/10/2021 5:36:42 PM By: Worthy Keeler PA-C Entered By: Worthy Keeler on 06/10/2021 17:36:42 Doree Fudge ([id number]) -------------------------------------------------------------------------------- Physician Orders Details Patient Name: Norman, Palmer. Date of Service: 06/10/2021 2:15 PM Medical Record Number: [id number] Patient Account Number: [id number] Date of Birth/Sex: 1941-04-05 (81 y.o. Male) Treating RN: Donnamarie Poag Primary Care Provider: Lelon Huh Other Clinician: Referring Provider: Lelon Huh Treating Provider/Extender: Skipper Cliche in Treatment: 0 Verbal / Phone Orders: No Diagnosis Coding ICD-10 Coding Code Description [phone] Pressure ulcer of left buttock, stage 4 E11.622 Type 2 diabetes mellitus with other skin ulcer G30.8 Other Alzheimer's disease N18.9 Chronic kidney disease, unspecified M62.81 Muscle weakness (generalized) Follow-up Appointments o Return Appointment in 1 week. Home Health Wound #1 Left [place]: - continue [facility] Bathing/ Shower/ Hygiene o May shower; gently cleanse wound with antibacterial soap, rinse and pat dry prior to dressing wounds - May also wash wound with Dakins solution o No tub bath. Off-Loading o Gel wheelchair cushion - Home health to order [facility] bed/mattress o Low air-loss mattress (Group 2) - Home health to order o Turn and reposition every 2 hours Negative Pressure Wound Therapy Wound #1 Left Gluteus o Wound VAC settings at 162mmHg continuous pressure. Use foam to wound  cavity. Please order WHITE foam to fill any tunnel/s and/or undermining when necessary. Change VAC dressing 3 X WEEK. Change canister as indicated when full. - Continue below orders for wound until wound vac arrives o Apply contact layer over base of wound. o Number of foam/gauze pieces used in the dressing = - Use one piece foam and touch the bottom of wound when inserted Wound Treatment Wound #1 - Gluteus Wound Laterality: Left Cleanser: Dakin 16 (oz) 0.25 2 x Per Day/30 Days Discharge Instructions: Use as directed. Cleanser: Soap and Water 2 x Per Day/30 Days Discharge Instructions: Gently cleanse wound with antibacterial soap, rinse and pat dry prior to dressing wounds Primary Dressing: Gauze 2 x Per Day/30 Days Discharge Instructions: As directed: moistened with moistened with Dakins Solution Secondary Dressing: Mepilex Border Flex, 4x4 (in/in) 2 x Per Day/30 Days Discharge Instructions: Apply to wound as directed. Do not cut. Patient Medications Allergies: No Known Drug Allergies AVIGDOR, DOLLAR ([id number]) Notifications Medication Indication Start End Dakin's Solution 06/10/2021 DOSE miscellaneous 0.25 % solution - Moisten gauze with Dakin's solution then wring out leaving gauze damp not saturated before packing into the wound bed lightly as directed in Electronic Signature(s) Signed: 06/10/2021 5:38:25 PM By: Worthy Keeler PA-C Previous Signature: 06/10/2021 4:26:29 PM Version By: Donnamarie Poag Previous Signature: 06/10/2021 3:53:18 PM Version By: Donnamarie Poag Entered By: Worthy Keeler on 06/10/2021 17:38:25 Doree Fudge ([id number]) -------------------------------------------------------------------------------- Problem List Details Patient Name: SAMUELL, KNOBLE. Date of Service: 06/10/2021 2:15 PM Medical Record Number: [id number] Patient Account Number: [id number] Date of Birth/Sex: [birth year] (80 y.o. Male) Treating RN: Donnamarie Poag Primary Care Provider:  Lelon Huh Other Clinician: Referring Provider: Lelon Huh Treating Provider/Extender: Skipper Cliche in Treatment: 0 Active Problems ICD-10 Encounter Code Description Active Date MDM Diagnosis [phone] Pressure ulcer of left buttock, stage 4 06/10/2021 No Yes E11.622 Type 2 diabetes mellitus with other skin ulcer 06/10/2021 No Yes G30.8 Other Alzheimer's disease 06/10/2021 No Yes N18.9 Chronic kidney disease, unspecified 06/10/2021 No Yes M62.81 Muscle weakness (generalized) 06/10/2021 No Yes Inactive Problems Resolved Problems Electronic Signature(s)  Signed: 06/10/2021 3:15:00 PM By: Worthy Keeler PA-C Entered By: Worthy Keeler on 06/10/2021 15:15:00 Doree Fudge ([id number]) -------------------------------------------------------------------------------- Progress Note Details Patient Name: ANDRANIK, JEUNE. Date of Service: 06/10/2021 2:15 PM Medical Record Number: [id number] Patient Account Number: [id number] Date of Birth/Sex: 1941/05/07 (80 y.o. 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 This occurred when he was actually in a skilled nursing facility his wife tells me at [facility]. She was very upset about this. She recently took him home he unfortunately does have Alzheimer's which seems to be advancing. She notes that currently he seems to be getting worse in regard to his anger and outburst. He consistently during the time that was he was in the office today was calling his wife by explosives but I will put in the chart but nonetheless I do not think this is him  she states this is nothing like how he was before the Alzheimer's caused some any changes in him. She also notes however that he also seems to be worse than normal she is concerned that he may have an issue here with a urinary tract infection is recently had 1 and she feels like that might be returning. The patient is generally weak. He also has a history of diabetes mellitus type 2, Alzheimer's disease and chronic kidney disease although she does not know how severe. Patient History Unable to Obtain Patient History due to Dementia. Allergies No Known Drug Allergies Social History Former smoker, Marital Status - Married. Medical History Eyes Denies history of Cataracts, Glaucoma, Optic Neuritis Ear/Nose/Mouth/Throat Denies history of Chronic sinus problems/congestion, Middle ear problems Hematologic/Lymphatic Denies history of Anemia, Hemophilia, Human Immunodeficiency Virus, Lymphedema, Sickle Cell Disease Respiratory Denies history of Aspiration, Asthma, Chronic Obstructive Pulmonary Disease (COPD), Pneumothorax, Sleep Apnea, Tuberculosis Cardiovascular Patient has history of Hypertension Gastrointestinal Denies history of Cirrhosis , Colitis, Crohn s, Hepatitis A, Hepatitis C Endocrine Patient has history of Type II Diabetes Denies history of Type I Diabetes Immunological Denies history of Lupus Erythematosus, Raynaud s, Scleroderma Integumentary (Skin) Patient has history of History of pressure wounds Denies history of History of Burn Musculoskeletal Denies history of Gout, Rheumatoid Arthritis, Osteoarthritis, Osteomyelitis Neurologic Patient has history of Dementia Denies history of Neuropathy, Quadriplegia, Paraplegia Oncologic Denies history of Received Chemotherapy, Received Radiation Psychiatric Denies history of Anorexia/bulimia, Confinement Anxiety Review of Systems (ROS) Constitutional Symptoms (General Health) Denies complaints or symptoms of Fatigue, Fever,  Chills, Marked Weight Change. Eyes Denies complaints or symptoms of Dry Eyes, Vision Changes, Glasses / Contacts. Ear/Nose/Mouth/Throat Denies complaints or symptoms of Difficult clearing ears, Sinusitis. KIRKLAND, FIGG Vadito. ([id number]) Hematologic/Lymphatic Denies complaints or symptoms of Bleeding / Clotting Disorders, Human Immunodeficiency Virus. Respiratory Denies complaints or symptoms of Chronic or frequent coughs, Shortness of Breath. Cardiovascular Denies complaints or symptoms of Chest pain, LE edema. Gastrointestinal Denies complaints or symptoms of Frequent diarrhea, Nausea, Vomiting. Endocrine Denies complaints or symptoms of Hepatitis, Thyroid disease, Polydypsia (Excessive Thirst). Genitourinary Complains or has symptoms of Incontinence/dribbling. Denies complaints or symptoms of Kidney failure/ Dialysis. Immunological Denies complaints or symptoms of Hives, Itching. Integumentary (Skin) Complains or has symptoms of Wounds, Bleeding or bruising tendency, Breakdown. Denies complaints or symptoms  of Swelling. Musculoskeletal Complains or has symptoms of Muscle Weakness. Denies complaints or symptoms of Muscle Pain. Psychiatric Denies complaints or symptoms of Anxiety, Claustrophobia. Objective Constitutional sitting or standing blood pressure is within target range for patient.. pulse regular and within target range for patient.Marland Kitchen respirations regular, non- labored and within target range for patient.Marland Kitchen temperature within target range for patient.. Well-nourished and well-hydrated in no acute distress. Vitals Time Taken: 2:38 PM, Height: 72 in, Source: Stated, Weight: 282 lbs, Source: Stated, BMI: 38.2, Temperature: 98.2 F, Pulse: 76 bpm, Respiratory Rate: 18 breaths/min, Blood Pressure: 110/53 mmHg. Eyes conjunctiva clear no eyelid edema noted. pupils equal round and reactive to light and accommodation. Ears, Nose, Mouth, and Throat no gross abnormality of ear  auricles or external auditory canals. normal hearing noted during conversation. mucus membranes moist. Respiratory normal breathing without difficulty. Musculoskeletal Patient unable to walk without assistance. Psychiatric Patient is not able to cooperate in decision making regarding care. Patient has dementia. patient is agitated. General Notes: Upon inspection patient's wound bed actually showed signs of good granulation epithelization at this point. There does not appear to be any evidence of active infection which is great news. With that being said they have been using Silvadene followed by hydrogel which I really think is probably not the best way to go. I think the Dakin's moistened gauze dressing would probably be better for the patient. That was discussed with the patient's wife today. Integumentary (Hair, Skin) Wound #1 status is Open. Original cause of wound was Pressure Injury. The date acquired was: 04/15/2021. The wound is located on the Left Gluteus. The wound measures 3.1cm length x 2.3cm width x 6cm depth; 5.6cm^2 area and 33.599cm^3 volume. There is Fat Layer (Subcutaneous Tissue) exposed. Tunneling has been noted at 4:00 with a maximum distance of 3cm. Undermining begins at 12:00 and ends at 4:00 with a maximum distance of 1.2cm. There is a large amount of serosanguineous drainage noted. There is medium (34-66%) red granulation within the wound bed. There is a medium (34-66%) amount of necrotic tissue within the wound bed including Adherent Slough. Assessment Active Problems NIRAJ, KUDRNA ([id number]) ICD-10 Pressure ulcer of left buttock, stage 4 Type 2 diabetes mellitus with other skin ulcer Other Alzheimer's disease Chronic kidney disease, unspecified Muscle weakness (generalized) Plan Follow-up Appointments: Return Appointment in 1 week. Home Health: Wound #1 Left Gluteus: [place]: - continue [facility] Bathing/ Shower/ Hygiene: May shower;  gently cleanse wound with antibacterial soap, rinse and pat dry prior to dressing wounds - May also wash wound with Dakins solution No tub bath. Off-Loading: Gel wheelchair cushion - Home health to order Hospital bed/mattress Low air-loss mattress (Group 2) - Home health to order Turn and reposition every 2 hours Negative Pressure Wound Therapy: Wound #1 Left Gluteus: Wound VAC settings at 183mmHg continuous pressure. Use foam to wound cavity. Please order WHITE foam to fill any tunnel/s and/or undermining when necessary. Change VAC dressing 3 X WEEK. Change canister as indicated when full. - Continue below orders for wound until wound vac arrives Apply contact layer over base of wound. Number of foam/gauze pieces used in the dressing = - Use one piece foam and touch the bottom of wound when inserted The following medication(s) was prescribed: Dakin's Solution miscellaneous 0.25 % solution Moisten gauze with Dakin's solution then wring out leaving gauze damp not saturated before packing into the wound bed lightly as directed in starting 06/10/2021 WOUND #1: - Gluteus Wound Laterality: Left Cleanser: Verdene Lennert  16 (oz) 0.25 2 x Per Day/30 Days Discharge Instructions: Use as directed. Cleanser: Soap and Water 2 x Per Day/30 Days Discharge Instructions: Gently cleanse wound with antibacterial soap, rinse and pat dry prior to dressing wounds Primary Dressing: Gauze 2 x Per Day/30 Days Discharge Instructions: As directed: moistened with moistened with Dakins Solution Secondary Dressing: Mepilex Border Flex, 4x4 (in/in) 2 x Per Day/30 Days Discharge Instructions: Apply to wound as directed. Do not cut. 1. Would recommend currently that we go ahead and send in a prescription for the [facility] solution for the patient. She is in agreement with that plan and will initiate treatment with that 2 times per day. I will get that sent into the pharmacy today. 2. I am also can recommend currently that we had the  patient offload appropriately they have been doing this at home unfortunately his wife does have help which is great news. With that being said I think appropriate and frequent offloading is the key here to getting this better. 3. Were also get a look into seeing about a wound VAC which I think long-term would be a much better way to go for the patient. With that being said we need to get that ordered in the meantime the Dakin's solution will be continued. 4. With regard to his outburst and his attitude in general I think it would be good to discuss with his primary care provider and neurologist whether or not they feel like he may have a repeat urinary tract infection. We will see patient back for reevaluation in 1 week here in the clinic. If anything worsens or changes patient will contact our office for additional recommendations. Electronic Signature(s) Signed: 06/10/2021 5:38:32 PM By: Worthy Keeler PA-C Entered By: Worthy Keeler on 06/10/2021 17:38:32 Doree Fudge ([id number]) -------------------------------------------------------------------------------- ROS/PFSH Details Patient Name: ROZELL, THEILER. Date of Service: 06/10/2021 2:15 PM Medical Record Number: [id number] Patient Account Number: [id number] Date of Birth/Sex: 1941/09/26 (80 y.o. Male) Treating RN: Dolan Amen Primary Care Provider: Lelon Huh Other Clinician: Referring Provider: Lelon Huh Treating Provider/Extender: Skipper Cliche in Treatment: 0 Unable to Obtain Patient History due to oo Dementia Constitutional Symptoms (General Health) Complaints and Symptoms: Negative for: Fatigue; Fever; Chills; Marked Weight Change Eyes Complaints and Symptoms: Negative for: Dry Eyes; Vision Changes; Glasses / Contacts Medical History: Negative for: Cataracts; Glaucoma; Optic Neuritis Ear/Nose/Mouth/Throat Complaints and Symptoms: Negative for: Difficult clearing ears; Sinusitis Medical  History: Negative for: Chronic sinus problems/congestion; Middle ear problems Hematologic/Lymphatic Complaints and Symptoms: Negative for: Bleeding / Clotting Disorders; Human Immunodeficiency Virus Medical History: Negative for: Anemia; Hemophilia; Human Immunodeficiency Virus; Lymphedema; Sickle Cell Disease Respiratory Complaints and Symptoms: Negative for: Chronic or frequent coughs; Shortness of Breath Medical History: Negative for: Aspiration; Asthma; Chronic Obstructive Pulmonary Disease (COPD); Pneumothorax; Sleep Apnea; Tuberculosis Cardiovascular Complaints and Symptoms: Negative for: Chest pain; LE edema Medical History: Positive for: Hypertension Gastrointestinal Complaints and Symptoms: Negative for: Frequent diarrhea; Nausea; Vomiting Medical History: Negative for: Cirrhosis ; Colitis; Crohnos; Hepatitis A; Hepatitis C Endocrine Complaints and Symptoms: Negative for: Hepatitis; Thyroid disease; Polydypsia (Excessive Thirst) KHALED, HERDA ([id number]) Medical History: Positive for: Type II Diabetes Negative for: Type I Diabetes Genitourinary Complaints and Symptoms: Positive for: Incontinence/dribbling Negative for: Kidney failure/ Dialysis Immunological Complaints and Symptoms: Negative for: Hives; Itching Medical History: Negative for: Lupus Erythematosus; Raynaudos; Scleroderma Integumentary (Skin) Complaints and Symptoms: Positive for: Wounds; Bleeding or bruising tendency; Breakdown Negative for: Swelling Medical History: Positive for: History of pressure wounds Negative  for: History of Burn Musculoskeletal Complaints and Symptoms: Positive for: Muscle Weakness Negative for: Muscle Pain Medical History: Negative for: Gout; Rheumatoid Arthritis; Osteoarthritis; Osteomyelitis Psychiatric Complaints and Symptoms: Negative for: Anxiety; Claustrophobia Medical History: Negative for: Anorexia/bulimia; Confinement Anxiety Neurologic Medical  History: Positive for: Dementia Negative for: Neuropathy; Quadriplegia; Paraplegia Oncologic Medical History: Negative for: Received Chemotherapy; Received Radiation Immunizations Pneumococcal Vaccine: Received Pneumococcal Vaccination: Yes Implantable Devices No devices added Family and Social History Former smoker; Marital Status - Married MASYN, ROSTRO Stow. ([id number]) Electronic Signature(s) Signed: 06/10/2021 4:29:35 PM By: Charlett Nose RN Signed: 06/10/2021 6:32:22 PM By: Worthy Keeler PA-C Entered By: Georges Mouse, Minus Breeding on 06/10/2021 14:59:47 Doree Fudge ([id number]) -------------------------------------------------------------------------------- [place] Details Patient Name: NORMA, IGNASIAK. Date of Service: 06/10/2021 Medical Record Number: [id number] Patient Account Number: [id number] Date of Birth/Sex: 1940-12-31 (80 y.o. Male) Treating RN: Donnamarie Poag Primary Care Provider: Lelon Huh Other Clinician: Referring Provider: Lelon Huh Treating Provider/Extender: Skipper Cliche in Treatment: 0 Diagnosis Coding ICD-10 Codes Code Description [phone] Pressure ulcer of left buttock, stage 4 E11.622 Type 2 diabetes mellitus with other skin ulcer G30.8 Other Alzheimer's disease N18.9 Chronic kidney disease, unspecified M62.81 Muscle weakness (generalized) Facility Procedures CPT4 Code: [id number] Description: 99213 - WOUND CARE VISIT-LEV 3 EST PT Modifier: Quantity: 1 Physician Procedures CPT4 Code: [id number] Description: 16606 - WC PHYS LEVEL 4 - NEW PT Modifier: Quantity: 1 CPT4 Code: Description: ICD-10 Diagnosis Description L89.324 Pressure ulcer of left buttock, stage 4 E11.622 Type 2 diabetes mellitus with other skin ulcer G30.8 Other Alzheimer's disease N18.9 Chronic kidney disease, unspecified Modifier: Quantity: Electronic Signature(s) Signed: 06/10/2021 5:38:46 PM By: Worthy Keeler PA-C Previous Signature: 06/10/2021  3:55:06 PM Version By: Donnamarie Poag Entered By: Worthy Keeler on 06/10/2021 17:38:46

## 2021-06-10 NOTE — Telephone Encounter (Signed)
Incoming call from home health nurse who states that the patient's family is concerned the patient may have a UTI based on dark urine, urine odor, and increased agitation in the patient. Advised nurse that per provider's last note the patient needs to be seen and evaluated in office if at all possible as he has a hx of stricture and urinary retention. She states that she will call family member to access the transportation needs of the patient.

## 2021-06-10 NOTE — Progress Notes (Signed)
ODYSSEUS, CADA ([id number]) Visit Report for 06/10/2021 Allergy List Details Patient Name: Norman Palmer, [name] Date of Service: 06/10/2021 2:15 PM Medical Record Number: [id number] Patient Account Number: [id number] Date of Birth/Sex: 1941/12/15 (80 y.o. Male) Treating RN: Dolan Amen Primary Care Handsome Anglin: Lelon Huh Other Clinician: Referring Ruth Kovich: Lelon Huh Treating Katniss Weedman/Extender: Jeri Cos Weeks in Treatment: 0 Allergies Active Allergies No Known Drug Allergies Allergy Notes Electronic Signature(s) Signed: 06/10/2021 4:29:35 PM By: Georges Mouse, Minus Breeding RN Entered By: Georges Mouse, Minus Breeding on 06/10/2021 14:51:22 Doree Fudge ([id number]) -------------------------------------------------------------------------------- Arrival Information Details Patient Name: [name] Date of Service: 06/10/2021 2:15 PM Medical Record Number: [id number] Patient Account Number: [id number] Date of Birth/Sex: 07-12-1941 (80 y.o. Male) Treating RN: Dolan Amen Primary Care Neymar Dowe: Lelon Huh Other Clinician: Referring Juelz Claar: Lelon Huh Treating Jiaire Rosebrook/Extender: Skipper Cliche in Treatment: 0 Visit Information Patient Arrived: Stretcher Arrival Time: 14:36 Accompanied By: wife Transfer Assistance: Stretcher Patient Identification Verified: Yes Secondary Verification Process Completed: Yes Electronic Signature(s) Signed: 06/10/2021 4:29:35 PM By: Charlett Nose RN Entered By: Georges Mouse, Minus Breeding on 06/10/2021 14:38:24 Doree Fudge ([id number]) -------------------------------------------------------------------------------- Clinic Level of Care Assessment Details Patient Name: [name] Date of Service: 06/10/2021 2:15 PM Medical Record Number: [id number] Patient Account Number: [id number] Date of Birth/Sex: 1941/11/03 (80 y.o. Male) Treating RN: Donnamarie Poag Primary Care Havard Radigan: Lelon Huh Other  Clinician: Referring Para Cossey: Lelon Huh Treating Brenner Visconti/Extender: Skipper Cliche in Treatment: 0 Clinic Level of Care Assessment Items TOOL 2 Quantity Score []  - Use when only an EandM is performed on the INITIAL visit 0 ASSESSMENTS - Nursing Assessment / Reassessment X - General Physical Exam (combine w/ comprehensive assessment (listed just below) when performed on new 1 20 pt. evals) []  - 0 Comprehensive Assessment (HX, ROS, Risk Assessments, Wounds Hx, etc.) ASSESSMENTS - Wound and Skin Assessment / Reassessment X - Simple Wound Assessment / Reassessment - one wound 1 5 []  - 0 Complex Wound Assessment / Reassessment - multiple wounds []  - 0 Dermatologic / Skin Assessment (not related to wound area) ASSESSMENTS - Ostomy and/or Continence Assessment and Care []  - Incontinence Assessment and Management 0 []  - 0 Ostomy Care Assessment and Management (repouching, etc.) PROCESS - Coordination of Care []  - Simple Patient / Family Education for ongoing care 0 X- 1 20 Complex (extensive) Patient / Family Education for ongoing care X- 1 10 Staff obtains Programmer, systems, Records, Test Results / Process Orders X- 1 10 Staff telephones HHA, Nursing Homes / Clarify orders / etc []  - 0 Routine Transfer to another Facility (non-emergent condition) []  - 0 Routine Hospital Admission (non-emergent condition) []  - 0 New Admissions / Biomedical engineer / Ordering NPWT, Apligraf, etc. []  - 0 Emergency Hospital Admission (emergent condition) []  - 0 Simple Discharge Coordination X- 1 15 Complex (extensive) Discharge Coordination PROCESS - Special Needs []  - Pediatric / Minor Patient Management 0 []  - 0 Isolation Patient Management []  - 0 Hearing / Language / Visual special needs []  - 0 Assessment of Community assistance (transportation, D/C planning, etc.) []  - 0 Additional assistance / Altered mentation []  - 0 Support Surface(s) Assessment (bed, cushion, seat,  etc.) INTERVENTIONS - Wound Cleansing / Measurement []  - Wound Imaging (photographs - any number of wounds) 0 []  - 0 Wound Tracing (instead of photographs) X- 1 5 Simple Wound Measurement - one wound []  - 0 Complex Wound Measurement - multiple wounds XAIVER, ROSKELLEY. ([id number]) X- 1 5 Simple Wound Cleansing - one wound []  - 0  Complex Wound Cleansing - multiple wounds INTERVENTIONS - Wound Dressings []  - Small Wound Dressing one or multiple wounds 0 X- 1 15 Medium Wound Dressing one or multiple wounds []  - 0 Large Wound Dressing one or multiple wounds []  - 0 Application of Medications - injection INTERVENTIONS - Miscellaneous []  - External ear exam 0 []  - 0 Specimen Collection (cultures, biopsies, blood, body fluids, etc.) []  - 0 Specimen(s) / Culture(s) sent or taken to Lab for analysis []  - 0 Patient Transfer (multiple staff / Harrel Lemon Lift / Similar devices) []  - 0 Simple Staple / Suture removal (25 or less) []  - 0 Complex Staple / Suture removal (26 or more) []  - 0 Hypo / Hyperglycemic Management (close monitor of Blood Glucose) []  - 0 Ankle / Brachial Index (ABI) - do not check if billed separately Has the patient been seen at the hospital within the last three years: Yes Total Score: 105 Level Of Care: New/Established - Level 3 Electronic Signature(s) Signed: 06/10/2021 4:26:29 PM By: Donnamarie Poag Entered By: Donnamarie Poag on 06/10/2021 15:54:51 Doree Fudge ([id number]) -------------------------------------------------------------------------------- Lower Extremity Assessment Details Patient Name: [name] Date of Service: 06/10/2021 2:15 PM Medical Record Number: [id number] Patient Account Number: [id number] Date of Birth/Sex: 1941/07/07 (80 y.o. Male) Treating RN: Dolan Amen Primary Care Markelle Asaro: Lelon Huh Other Clinician: Referring Kearia Yin: Lelon Huh Treating Chiffon Kittleson/Extender: Skipper Cliche in Treatment: 0 Electronic  Signature(s) Signed: 06/10/2021 4:29:35 PM By: Georges Mouse, Minus Breeding RN Entered By: Georges Mouse, Minus Breeding on 06/10/2021 14:38:58 Doree Fudge ([id number]) -------------------------------------------------------------------------------- Multi Wound Chart Details Patient Name: AQEEL, [name] Date of Service: 06/10/2021 2:15 PM Medical Record Number: [id number] Patient Account Number: [id number] Date of Birth/Sex: September 17, 1941 (80 y.o. Male) Treating RN: Donnamarie Poag Primary Care Manroop Jakubowicz: Lelon Huh Other Clinician: Referring Tracie Dore: Lelon Huh Treating Curtisha Bendix/Extender: Skipper Cliche in Treatment: 0 Vital Signs Height(in): 72 Pulse(bpm): 42 Weight(lbs): 282 Blood Pressure(mmHg): 110/53 Body Mass Index(BMI): 38 Temperature(F): 98.2 Respiratory Rate(breaths/min): 18 Photos: [1:No Photos] [N/A:N/A] Wound Location: [1:Left Gluteus] [N/A:N/A] Wounding Event: [1:Pressure Injury] [N/A:N/A] Primary Etiology: [1:Pressure Ulcer] [N/A:N/A] Date Acquired: [1:04/15/2021] [N/A:N/A] Weeks of Treatment: [1:0] [N/A:N/A] Wound Status: [1:Open] [N/A:N/A] Measurements L x W x D (cm) [1:3.1x2.3x6] [N/A:N/A] Area (cm) : [1:5.6] [N/A:N/A] Volume (cm) : [1:33.599] [N/A:N/A] Position 1 (o'clock): [1:4] Maximum Distance 1 (cm): [1:3] Starting Position 1 (o'clock): [1:12] Ending Position 1 (o'clock): [1:4] Maximum Distance 1 (cm): [1:1.2] Tunneling: [1:Yes] [N/A:N/A] Undermining: [1:Yes] [N/A:N/A] Classification: [1:Category/Stage IV] [N/A:N/A] Exudate Amount: [1:Large] [N/A:N/A] Exudate Type: [1:Serosanguineous] [N/A:N/A] Exudate Color: [1:red, brown] [N/A:N/A] Granulation Amount: [1:Medium (34-66%)] [N/A:N/A] Granulation Quality: [1:Red] [N/A:N/A] Necrotic Amount: [1:Medium (34-66%)] [N/A:N/A] Exposed Structures: [1:Fat Layer (Subcutaneous Tissue): Yes Fascia: No Tendon: No Muscle: No Joint: No Bone: No None] [N/A:N/A N/A] Treatment Notes Electronic  Signature(s) Signed: 06/10/2021 4:26:29 PM By: Donnamarie Poag Entered By: Donnamarie Poag on 06/10/2021 15:41:11 Doree Fudge ([id number]) -------------------------------------------------------------------------------- [place] Details Patient Name: NACHMEN, [name] Date of Service: 06/10/2021 2:15 PM Medical Record Number: [id number] Patient Account Number: [id number] Date of Birth/Sex: 1941/07/09 (80 y.o. Male) Treating RN: Donnamarie Poag Primary Care Amante Fomby: Lelon Huh Other Clinician: Referring Tzirel Leonor: Lelon Huh Treating Shareka Casale/Extender: Skipper Cliche in Treatment: 0 Active Inactive Orientation to the Wound Care Program Nursing Diagnoses: Knowledge deficit related to the wound healing center program Goals: Patient/caregiver will verbalize understanding of the [facility] Date Initiated: 06/10/2021 Target Resolution Date: 06/17/2021 Goal Status: Active Interventions: Provide education on orientation to the wound center Notes: Pressure Nursing  Diagnoses: Knowledge deficit related to causes and risk factors for pressure ulcer development Knowledge deficit related to management of pressures ulcers Potential for impaired tissue integrity related to pressure, friction, moisture, and shear Goals: Patient will remain free of pressure ulcers Date Initiated: 06/10/2021 Target Resolution Date: 07/15/2021 Goal Status: Active Patient/caregiver will verbalize risk factors for pressure ulcer development Date Initiated: 06/10/2021 Target Resolution Date: 07/15/2021 Goal Status: Active Interventions: Assess: immobility, friction, shearing, incontinence upon admission and as needed Assess offloading mechanisms upon admission and as needed Assess potential for pressure ulcer upon admission and as needed Notes: Wound/Skin Impairment Nursing Diagnoses: Impaired tissue integrity Knowledge deficit related to ulceration/compromised skin  integrity Goals: Patient/caregiver will verbalize understanding of skin care regimen Date Initiated: 06/10/2021 Target Resolution Date: 07/15/2021 Goal Status: Active Ulcer/skin breakdown will have a volume reduction of 30% by week 4 Date Initiated: 06/10/2021 Target Resolution Date: 07/15/2021 Goal Status: Active Ulcer/skin breakdown will have a volume reduction of 50% by week 8 Date Initiated: 06/10/2021 Target Resolution Date: 08/19/2021 Goal Status: Active JASHAD, DEPAULA ([id number]) Ulcer/skin breakdown will have a volume reduction of 80% by week 12 Date Initiated: 06/10/2021 Target Resolution Date: 09/09/2021 Goal Status: Active Interventions: Assess patient/caregiver ability to obtain necessary supplies Assess patient/caregiver ability to perform ulcer/skin care regimen upon admission and as needed Assess ulceration(s) every visit Treatment Activities: Patient referred to home care : 06/10/2021 Notes: Electronic Signature(s) Signed: 06/10/2021 4:26:29 PM By: Donnamarie Poag Entered By: Donnamarie Poag on 06/10/2021 15:41:00 Doree Fudge ([id number]) -------------------------------------------------------------------------------- Pain Assessment Details Patient Name: ARLANDER, [name] Date of Service: 06/10/2021 2:15 PM Medical Record Number: [id number] Patient Account Number: [id number] Date of Birth/Sex: July 23, 1941 (80 y.o. Male) Treating RN: Dolan Amen Primary Care Adith Tejada: Lelon Huh Other Clinician: Referring Lee Kuang: Lelon Huh Treating Asuna Peth/Extender: Skipper Cliche in Treatment: 0 Active Problems Location of Pain Severity and Description of Pain Patient Has Paino No Site Locations Rate the pain. Current Pain Level: 0 Pain Management and Medication Current Pain Management: Electronic Signature(s) Signed: 06/10/2021 4:29:35 PM By: Georges Mouse, Minus Breeding RN Entered By: Georges Mouse, Kenia on 06/10/2021 14:38:37 Doree Fudge  ([id number]) -------------------------------------------------------------------------------- Patient/Caregiver Education Details Patient Name: CAYMEN, [name] Date of Service: 06/10/2021 2:15 PM Medical Record Number: [id number] Patient Account Number: [id number] Date of Birth/Gender: [birth year] (80 y.o. Male) Treating RN: Donnamarie Poag Primary Care Physician: Lelon Huh Other Clinician: Referring Physician: Lelon Huh Treating Physician/Extender: Skipper Cliche in Treatment: 0 Education Assessment Education Provided To: Caregiver wife Education Topics Provided Basic Hygiene: Methods: Demonstration, Explain/Verbal Responses: State content correctly Offloading: Responses: State content correctly Welcome To [facility]: Methods: Explain/Verbal Responses: State content correctly Wound/Skin Impairment: Methods: Demonstration, Explain/Verbal Responses: State content correctly Electronic Signature(s) Signed: 06/10/2021 4:26:29 PM By: Donnamarie Poag Entered By: Donnamarie Poag on 06/10/2021 15:56:08 Doree Fudge ([id number]) -------------------------------------------------------------------------------- Wound Assessment Details Patient Name: Norman Palmer, [name] Date of Service: 06/10/2021 2:15 PM Medical Record Number: [id number] Patient Account Number: [id number] Date of Birth/Sex: 06-22-1941 (80 y.o. Male) Treating RN: Dolan Amen Primary Care Horice Carrero: Lelon Huh Other Clinician: Referring Annsley Akkerman: Lelon Huh Treating Silas Sedam/Extender: Jeri Cos Weeks in Treatment: 0 Wound Status Wound Number: 1 Primary Etiology: Pressure Ulcer Wound Location: Left Gluteus Wound Status: Open Wounding Event: Pressure Injury Date Acquired: 04/15/2021 Weeks Of Treatment: 0 Clustered Wound: No Wound Measurements Length: (cm) 3.1 Width: (cm) 2.3 Depth: (cm) 6 Area: (cm) 5.6 Volume: (cm) 33.599 % Reduction in Area: % Reduction in  Volume: Epithelialization: None Tunneling: Yes Position (o'clock): 4  Maximum Distance: (cm) 3 Undermining: Yes Starting Position (o'clock): 12 Ending Position (o'clock): 4 Maximum Distance: (cm) 1.2 Wound Description Classification: Category/Stage IV Exudate Amount: Large Exudate Type: Serosanguineous Exudate Color: red, brown Foul Odor After Cleansing: No Slough/Fibrino Yes Wound Bed Granulation Amount: Medium (34-66%) Exposed Structure Granulation Quality: Red Fascia Exposed: No Necrotic Amount: Medium (34-66%) Fat Layer (Subcutaneous Tissue) Exposed: Yes Necrotic Quality: Adherent Slough Tendon Exposed: No Muscle Exposed: No Joint Exposed: No Bone Exposed: No Electronic Signature(s) Signed: 06/10/2021 4:29:35 PM By: Georges Mouse, Minus Breeding RN Entered By: Georges Mouse, Kenia on 06/10/2021 14:51:12 Doree Fudge ([id number]) -------------------------------------------------------------------------------- Vitals Details Patient Name: Doree Fudge. Date of Service: 06/10/2021 2:15 PM Medical Record Number: [id number] Patient Account Number: [id number] Date of Birth/Sex: 1941-10-18 (79 y.o. Male) Treating RN: Dolan Amen Primary Care Duyen Beckom: Lelon Huh Other Clinician: Referring Briawna Carver: Lelon Huh Treating Caroleann Casler/Extender: Skipper Cliche in Treatment: 0 Vital Signs Time Taken: 14:38 Temperature (F): 98.2 Height (in): 72 Pulse (bpm): 76 Source: Stated Respiratory Rate (breaths/min): 18 Weight (lbs): 282 Blood Pressure (mmHg): 110/53 Source: Stated Reference Range: 80 - 120 mg / dl Body Mass Index (BMI): 38.2 Electronic Signature(s) Signed: 06/10/2021 4:29:35 PM By: Georges Mouse, Minus Breeding RN Entered By: Georges Mouse, Minus Breeding on 06/10/2021 15:06:40

## 2021-06-11 ENCOUNTER — Ambulatory Visit (INDEPENDENT_AMBULATORY_CARE_PROVIDER_SITE_OTHER): Payer: Medicare Other | Admitting: Physician Assistant

## 2021-06-11 ENCOUNTER — Encounter: Payer: Self-pay | Admitting: Physician Assistant

## 2021-06-11 VITALS — BP 83/53 | HR 108 | Temp 97.4°F

## 2021-06-11 DIAGNOSIS — N390 Urinary tract infection, site not specified: Secondary | ICD-10-CM

## 2021-06-11 DIAGNOSIS — N401 Enlarged prostate with lower urinary tract symptoms: Secondary | ICD-10-CM

## 2021-06-11 DIAGNOSIS — F0281 Dementia in other diseases classified elsewhere with behavioral disturbance: Secondary | ICD-10-CM | POA: Diagnosis not present

## 2021-06-11 DIAGNOSIS — N138 Other obstructive and reflux uropathy: Secondary | ICD-10-CM

## 2021-06-11 DIAGNOSIS — G301 Alzheimer's disease with late onset: Secondary | ICD-10-CM | POA: Diagnosis not present

## 2021-06-11 LAB — MICROSCOPIC EXAMINATION
Bacteria, UA: NONE SEEN
WBC, UA: NONE SEEN /hpf (ref 0–5)

## 2021-06-11 LAB — URINALYSIS, COMPLETE
Bilirubin, UA: NEGATIVE
Glucose, UA: NEGATIVE
Ketones, UA: NEGATIVE
Leukocytes,UA: NEGATIVE
Nitrite, UA: NEGATIVE
Protein,UA: NEGATIVE
Specific Gravity, UA: 1.01 (ref 1.005–1.030)
Urobilinogen, Ur: 0.2 mg/dL (ref 0.2–1.0)
pH, UA: 5 (ref 5.0–7.5)

## 2021-06-11 LAB — BLADDER SCAN AMB NON-IMAGING: Scan Result: 54

## 2021-06-11 MED ORDER — FINASTERIDE 5 MG PO TABS: 5.0000 mg | ORAL_TABLET | Freq: Every day | ORAL | 11 refills | Status: AC

## 2021-06-11 MED ORDER — SILODOSIN 8 MG PO CAPS: 8.0000 mg | ORAL_CAPSULE | Freq: Every day | ORAL | 11 refills | Status: AC

## 2021-06-11 NOTE — Progress Notes (Signed)
06/11/2021 5:00 PM   [place] Oct 11, 1941 [id number]  CC: Chief Complaint  Patient presents with   Urinary Retention   HPI: Norman Palmer is a 80 y.o. male with PMH dementia, urethral stricture disease, BPH on Flomax and dutasteride, and recurrent UTI who presents today for evaluation of possible UTI.  He is accompanied today by his wife, who contributes to HPI.  Today his wife reports a several day long history of dark yellow, malodorous urine with decreased urinary output and increased agitation.  He is shouting and cursing in clinic today and attempting to bite his wife.  Additionally, his wife reports he is no longer taking oral pills easily and wonders if his urinary medications can be crushed.  In-office catheterized UA today positive for 1+ blood; urine microscopy pan negative.  Bladder scan with 13mL, however upon I&O catheterization a total of 458mL was drained from the patient's bladder.  PMH: Past Medical History:  Diagnosis Date   Alzheimer disease ([place])    Dementia ([place])    Diabetes mellitus without complication ([place])    H/O knee surgery 04/17/2015   Overview:  Bilateral knees surgery    History of basal cell carcinoma (BCC) of skin 08/12/2019   MOHS Right alar crease [facility] Dr. Manley Mason 07/2019   Infection with methicillin-resistant Staphylococcus aureus 08/22/2015   Insomnia    Lumbago    Sciatica    Thyroid disease     Surgical History: Past Surgical History:  Procedure Laterality Date   knee surgery Bilateral 2000   MOHS SURGERY Right 08/11/2019   BCC alar crease- [facility] Dr. Manley Mason   SKIN GRAFT Bilateral 1955   legs   URETHRAL STRICTURE DILATATION      Home Medications:  Allergies as of 06/11/2021       Reactions   Doxycycline Rash   Zinc Rash        Medication List        Accurate as of June 11, 2021  5:00 PM. If you have any questions, ask your nurse or doctor.          STOP taking these medications    dutasteride 0.5 MG  capsule Commonly known as: AVODART Stopped by: Debroah Loop, PA-C   tamsulosin 0.4 MG Caps capsule Commonly known as: FLOMAX Stopped by: Debroah Loop, PA-C       TAKE these medications    ALPRAZolam 1 MG tablet Commonly known as: XANAX TAKE 1 TABLET BY MOUTH THREE TIMES DAILY AS NEEDED   cephALEXin 250 MG capsule Commonly known as: KEFLEX Take 1 capsule (250 mg total) by mouth daily. For UTI prevention   donepezil 10 MG tablet Commonly known as: ARICEPT Take 10 mg by mouth at bedtime.   finasteride 5 MG tablet Commonly known as: PROSCAR Take 1 tablet (5 mg total) by mouth daily. Started by: Debroah Loop, PA-C   haloperidol 2 MG tablet Commonly known as: HALDOL Take 1 tablet (2 mg total) by mouth every 6 (six) hours as needed for agitation.   insulin NPH Human 100 UNIT/ML injection Commonly known as: NOVOLIN N Inject 30 Units into the skin daily.   lisinopril 20 MG tablet Commonly known as: ZESTRIL TAKE 1 TABLET BY MOUTH DAILY   memantine 10 MG tablet Commonly known as: NAMENDA Take 1 tablet (10 mg total) by mouth 2 (two) times daily.   metFORMIN 1000 MG tablet Commonly known as: GLUCOPHAGE TAKE ONE TABLET TWICE DAILY WITH MEALS What changed: See the new instructions.  risperiDONE 0.5 MG tablet Commonly known as: RisperDAL Take 1 tablet (0.5 mg total) by mouth at bedtime.   sertraline 50 MG tablet Commonly known as: ZOLOFT Take 50 mg by mouth daily.   silodosin 8 MG Caps capsule Commonly known as: RAPAFLO Take 1 capsule (8 mg total) by mouth daily with breakfast. Started by: Debroah Loop, PA-C   silver sulfADIAZINE 1 % cream Commonly known as: SILVADENE Apply 1 application topically daily.   triamterene-hydrochlorothiazide 37.5-25 MG capsule Commonly known as: DYAZIDE Take 1 capsule by mouth daily.   Vitamin D (Ergocalciferol) 1.25 MG (50000 UNIT) Caps capsule Commonly known as: DRISDOL TAKE 1 CAPSULE BY  MOUTH ONCE A WEEK   zinc oxide 20 % ointment Apply 1 application topically as needed for irritation. Apply to affected area daily        Allergies:  Allergies  Allergen Reactions   Doxycycline Rash   Zinc Rash    Family History: Family History  Problem Relation Age of Onset   Hypertension Father    Cancer Brother        Liver Cancer    Social History:   reports that he has quit smoking. His smoking use included cigarettes. He has a 60.00 pack-year smoking history. He has quit using smokeless tobacco. He reports current alcohol use of about 1.0 - 2.0 standard drink of alcohol per week. He reports that he does not use drugs.  Physical Exam: BP (!) 83/53   Pulse (!) 108   Temp (!) 97.4 F (36.3 C) (Oral)   Constitutional:  Alert, no acute distress, nontoxic appearing HEENT: [place], AT Cardiovascular: No clubbing, cyanosis, or edema Respiratory: Normal respiratory effort, no increased work of breathing Skin: No rashes, bruises or suspicious lesions Neurologic: Grossly intact, no focal deficits, moving all 4 extremities Psychiatric: Agitated and combative  Laboratory Data: Results for orders placed or performed in visit on 06/11/21  Microscopic Examination   Urine  Result Value Ref Range   WBC, UA None seen 0 - 5 /hpf   RBC 0-2 0 - 2 /hpf   Epithelial Cells (non renal) 0-10 0 - 10 /hpf   Bacteria, UA None seen None seen/Few  Urinalysis, Complete  Result Value Ref Range   Specific Gravity, UA 1.010 1.005 - 1.030   pH, UA 5.0 5.0 - 7.5   Color, UA Yellow Yellow   Appearance Ur Clear Clear   Leukocytes,UA Negative Negative   Protein,UA Negative Negative/Trace   Glucose, UA Negative Negative   Ketones, UA Negative Negative   RBC, UA 1+ (A) Negative   Bilirubin, UA Negative Negative   Urobilinogen, Ur 0.2 0.2 - 1.0 mg/dL   Nitrite, UA Negative Negative   Microscopic Examination See below:   Bladder Scan (Post Void Residual) in office  Result Value Ref Range   Scan  Result 54    Assessment & Plan:   1. Recurrent UTI No evidence of infection on catheterized UA today.  I counseled the patient's wife to have him follow-up with neurology versus in the emergency department for behavioral eval with her concerns of acute worsening of his mental status.  She expressed understanding. - Bladder Scan (Post Void Residual) in office - Urinalysis, Complete  2. 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  Patient's bladder volume was elevated today, however given his agitation, I expressed my concern that he may attempt to traumatically pull any Foley catheter we may place in  clinic.  Patient's wife agreed with this concern and wishes to proceed with conservative management.  I am switching his urologic agents from dutasteride to finasteride and tamsulosin to silodosin, as these new agents are crushable.  We will plan for repeat INO catheterization to check his residual in approximately 4 weeks.  Patient's wife is in agreement with this plan. - silodosin (RAPAFLO) 8 MG CAPS capsule; Take 1 capsule (8 mg total) by mouth daily with breakfast.  Dispense: 30 capsule; Refill: 11 - finasteride (PROSCAR) 5 MG tablet; Take 1 tablet (5 mg total) by mouth daily.  Dispense: 30 tablet; Refill: 11   Return in about 4 weeks (around 07/09/2021) for I&O catheterization.  Debroah Loop, PA-C  [facility] [address], [place], [place] [address] [phone]

## 2021-06-11 NOTE — Patient Instructions (Addendum)
Let's switch Norman Palmer from tamsulosin to silodosin today. Silodosin capsules may be opened and the contents sprinkled on applesauce.  I'd like to switch him from dutasteride to finasteride as well for the same reason. Finasteride may be crushed.

## 2021-06-11 NOTE — Progress Notes (Signed)
In and Out Catheterization  Patient is present today for a I & O catheterization due to decreased urinary output, agitation, malodorous urine. Patient was cleaned and prepped in a sterile fashion with betadine . A 14FR coude Social research officer, government cath was inserted no complications were noted, 416ml of urine return was noted, urine was yellow in color. A clean urine sample was collected for UA. Bladder was drained and catheter was removed without difficulty.    Performed by: Zara Council, PA-C, Elberta Leatherwood, [place], and Gordy Clement, [place]

## 2021-06-12 ENCOUNTER — Telehealth: Payer: Self-pay | Admitting: Adult Health Nurse Practitioner

## 2021-06-12 NOTE — Telephone Encounter (Signed)
Spoke with wife and confirmed appointment for 06/17/21 @ 1p Daviel Allegretto K. Olena Heckle NP

## 2021-06-16 DIAGNOSIS — E871 Hypo-osmolality and hyponatremia: Secondary | ICD-10-CM | POA: Diagnosis not present

## 2021-06-16 DIAGNOSIS — N401 Enlarged prostate with lower urinary tract symptoms: Secondary | ICD-10-CM | POA: Diagnosis not present

## 2021-06-16 DIAGNOSIS — R339 Retention of urine, unspecified: Secondary | ICD-10-CM | POA: Diagnosis not present

## 2021-06-16 DIAGNOSIS — F0391 Unspecified dementia with behavioral disturbance: Secondary | ICD-10-CM | POA: Diagnosis not present

## 2021-06-16 DIAGNOSIS — I10 Essential (primary) hypertension: Secondary | ICD-10-CM | POA: Diagnosis not present

## 2021-06-16 DIAGNOSIS — R262 Difficulty in walking, not elsewhere classified: Secondary | ICD-10-CM | POA: Diagnosis not present

## 2021-06-16 DIAGNOSIS — L89323 Pressure ulcer of left buttock, stage 3: Secondary | ICD-10-CM | POA: Diagnosis not present

## 2021-06-16 DIAGNOSIS — L89626 Pressure-induced deep tissue damage of left heel: Secondary | ICD-10-CM | POA: Diagnosis not present

## 2021-06-17 ENCOUNTER — Encounter: Payer: Self-pay | Admitting: Family Medicine

## 2021-06-17 ENCOUNTER — Other Ambulatory Visit: Payer: Self-pay

## 2021-06-17 ENCOUNTER — Encounter: Payer: Self-pay | Admitting: Adult Health Nurse Practitioner

## 2021-06-17 ENCOUNTER — Other Ambulatory Visit: Payer: Medicare Other | Admitting: Adult Health Nurse Practitioner

## 2021-06-17 VITALS — BP 102/48 | HR 43

## 2021-06-17 DIAGNOSIS — L89303 Pressure ulcer of unspecified buttock, stage 3: Secondary | ICD-10-CM

## 2021-06-17 DIAGNOSIS — F0391 Unspecified dementia with behavioral disturbance: Secondary | ICD-10-CM

## 2021-06-17 DIAGNOSIS — I959 Hypotension, unspecified: Secondary | ICD-10-CM | POA: Diagnosis not present

## 2021-06-17 DIAGNOSIS — G9009 Other idiopathic peripheral autonomic neuropathy: Secondary | ICD-10-CM

## 2021-06-17 DIAGNOSIS — Z515 Encounter for palliative care: Secondary | ICD-10-CM | POA: Diagnosis not present

## 2021-06-17 NOTE — Progress Notes (Signed)
[employer] Palliative Care Consult Note Telephone: [phone]  Fax: [phone]    Date of encounter: 06/17/21 PATIENT NAME: Norman Palmer [address] [place] [address]   [phone] (home) [phone] (work) DOB: 1941-08-16 MRN: [id number] PRIMARY CARE PROVIDER:    Birdie Sons, MD,  [address] [place] [address] [phone]  REFERRING PROVIDER:   Birdie Palmer, [place],  [place] [address] [phone]  RESPONSIBLE PARTY:    Contact Information     Name Relation Home Work [place] [phone]  [phone]   Norman Palmer   [phone]   Norman Palmer Relative   [phone]        I met face to face with patient and family in home. Palliative Care was asked to follow this patient by consultation request of  Fisher, Kirstie Peri, MD to address advance care planning and complex medical decision making. This is a follow up visit.  Wife present during visit today                                   ASSESSMENT AND PLAN / RECOMMENDATIONS:   Advance Care Planning/Goals of Care: Goals include to maximize quality of life and symptom management. Wife states that he had wishes to be DNR but is unsure if he has a DNR form in the home.  Filled out DNR and uploaded to [facility]   CODE STATUS: DNR  Symptom Management/Plan:  Dementia: Patient continues to be bedbound requiring total care including feeding.  Patient has noted muscle wasting and is to start PT.  Patient Haldol has been stopped and started on Depakote 180 mg twice daily.  Wife does state that this helps calm him.  The patient will still scream out at times and will make rude comments to family.  Pressure injury to buttocks: Starting to see some improvement with addition of wound VAC.  Continue follow-up and recommendations by wound clinic and follow-up and home with home health RN for wound  care  Low blood pressure: Patient has been having some low blood pressure readings with SBP in the 90s and 100s.  He is currently on lisinopril and triamterene.  Discussed with wife reaching out to PCP with this concern.  She states that she will reach out to him via MyChart.  Discussed with wife today hospice services and eligibility criteria for hospice.  Though patient has had significant functional decline over the past few months he has not had significant weight loss and wound is starting to show improvement with wound VAC.  We will reach out to hospice physicians for advice and contact wife once I hear back from hospice physicians.   Follow up Palliative Care Visit: Palliative care will continue to follow for complex medical decision making, advance care planning, and clarification of goals. Will schedule follow up appointment  in 4 weeks if not hospice eligible.  Encouraged to call with any questions or concerns.  I spent 75 minutes providing this consultation. More than 50% of the time in this consultation was spent in counseling and care coordination.   PPS: 30%  HOSPICE ELIGIBILITY/DIAGNOSIS: TBD  Chief Complaint: follow up palliative visit  HISTORY OF PRESENT ILLNESS:  Norman Palmer is a 80 y.o. year old male  with dementia, DMT2, hypothyroidism, CKD stage 3, BPH, HTN, OSA, h/o basal cell carcinoma .  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  Wound VAC has been started and this is being maintained at home by home health RN.  Wife does state at last wound VAC change that the wound has decreased in size.  Wife does state that he is eating less but she feeds him and does encourage him to eat as much as he can.  She states not noticing much of a change in his weight.  Patient will scream out in pain and will just state that it hurts all over.  Noticed that he will scream out more with position changes when head of bed is elevated.  Patient has  recently been started on Depakote 180 mg twice daily and Haldol has been stopped.  Wife does state that the Depakote seems to calm him down though he still has some episodes of agitation and screaming out.  This could also be related to pain from his spinal stenosis which he is being seen by neurology for.  Patient is starting to work with PT.  Patient does have noted muscle wasting.  Did discuss with wife new baselines and that he may not walk again but could still benefit with PT services.  Patient does have noted weak hands during today.  Patient did have urology consult and has history of urethral stricture.  Was found to have some urinary retention however Foley was not placed due to patient's agitation and fear that he would pull it out.  Has follow-up in 4 weeks with urology.  History obtained from review of EMR and interview with family and [name]    Physical Exam:   Constitutional: NAD; does scream out in pain periodically when raising head of bed up General: frail appearing, obese EYES: anicteric sclera, lids intact, no discharge ENMT: intact hearing, oral mucous membranes moist CV: S1S2, RRR, no LE edema Pulmonary: LCTA, no increased work of breathing, no cough Abdomen: normo-active BS + 4 quadrants, soft and non tender MSK: non ambulatory Skin: warm and dry, no rashes on visible skin; has pressure ulcer to buttocks (did not remove wound vac during exam today) Neuro:  A and O to person only   Thank you for the opportunity to participate in the care of [name]  The palliative care team will continue to follow. Please call our office at [phone] if we can be of additional assistance.    K , NP , DNP  This chart was dictated using voice recognition software.  Despite best efforts to proofread,  errors can occur which can change the documentation meaning.   COVID-19 PATIENT SCREENING TOOL Asked and negative response unless otherwise noted:   Have you had  symptoms of covid, tested positive or been in contact with someone with symptoms/positive test in the past 5-10 days?   

## 2021-06-18 ENCOUNTER — Encounter: Payer: Medicare Other | Admitting: Physician Assistant

## 2021-06-18 ENCOUNTER — Other Ambulatory Visit: Payer: Self-pay | Admitting: Family Medicine

## 2021-06-18 ENCOUNTER — Other Ambulatory Visit: Payer: Self-pay

## 2021-06-18 ENCOUNTER — Telehealth: Payer: Self-pay | Admitting: Adult Health Nurse Practitioner

## 2021-06-18 DIAGNOSIS — M2550 Pain in unspecified joint: Secondary | ICD-10-CM | POA: Diagnosis not present

## 2021-06-18 DIAGNOSIS — Z7401 Bed confinement status: Secondary | ICD-10-CM | POA: Diagnosis not present

## 2021-06-18 DIAGNOSIS — R0902 Hypoxemia: Secondary | ICD-10-CM | POA: Diagnosis not present

## 2021-06-18 DIAGNOSIS — E11622 Type 2 diabetes mellitus with other skin ulcer: Secondary | ICD-10-CM | POA: Diagnosis not present

## 2021-06-18 DIAGNOSIS — L89324 Pressure ulcer of left buttock, stage 4: Secondary | ICD-10-CM | POA: Diagnosis not present

## 2021-06-18 DIAGNOSIS — I959 Hypotension, unspecified: Secondary | ICD-10-CM | POA: Diagnosis not present

## 2021-06-18 NOTE — Progress Notes (Addendum)
[name], [name] ([id number]) Visit Report for 06/18/2021 Chief Complaint Document Details Patient Name: Norman Palmer, [name] Date of Service: 06/18/2021 10:45 AM Medical Record Number: [id number] Patient Account Number: [id number] Date of Birth/Sex: 1941/08/25 (80 y.o. M) Treating RN: Dolan Amen Primary Care Provider: Lelon Huh Other Clinician: Referring Provider: Lelon Huh Treating Provider/Extender: Skipper Cliche in Treatment: 1 Information Obtained from: Patient Chief Complaint Pressure ulcer left gluteus Electronic Signature(s) Signed: 06/18/2021 11:02:34 AM By: Worthy Keeler PA-C Entered By: Worthy Keeler on 06/18/2021 11:02:33 Norman Palmer ([id number]) -------------------------------------------------------------------------------- HPI Details Patient Name: Norman Palmer, [name] Date of Service: 06/18/2021 10:45 AM Medical Record Number: [id number] Patient Account Number: [id number] Date of Birth/Sex: 09-09-1941 (80 y.o. 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 This occurred when he was actually in a skilled nursing facility his wife tells me at [facility]. She was very upset about this. She recently took him home he unfortunately does have Alzheimer's which seems to be advancing. She notes that currently he seems to be getting worse in regard to his anger and outburst. He consistently during the time that was he was in the office today was calling his wife by explosives but I will put in the chart but nonetheless I do not think this is him she states this is nothing like how he was before the Alzheimer's caused some any changes in him. She  also notes however that he also seems to be worse than normal she is concerned that he may have an issue here with a urinary tract infection is recently had 1 and she feels like that might be returning. The patient is generally weak. He also has a history of diabetes mellitus type 2, Alzheimer's disease and chronic kidney disease although she does not know how severe. 06/18/2021 upon evaluation today patient appears to be doing well with regard to his wound. In fact the wound VAC seems to be doing an excellent job for him. There is no signs of active infection at this time which is great news. No fevers, chills, nausea, vomiting, or diarrhea. Electronic Signature(s) Signed: 06/18/2021 11:28:07 AM By: Worthy Keeler PA-C Entered By: Worthy Keeler on 06/18/2021 11:28:07 Norman Palmer ([id number]) -------------------------------------------------------------------------------- Physical Exam Details Patient Name: Norman Palmer, [name] Date of Service: 06/18/2021 10:45 AM Medical Record Number: [id number] Patient Account Number: [id number] Date of Birth/Sex: [birth year] (80 y.o. M) Treating RN: Dolan Amen Primary Care Provider: Lelon Huh Other Clinician: Referring Provider: Lelon Huh Treating Provider/Extender: Skipper Cliche in Treatment: 1 Constitutional Obese and well-hydrated in no acute distress. Respiratory normal breathing without difficulty. Psychiatric this patient is able to make decisions and demonstrates good insight into disease process. Alert and Oriented x 3. pleasant and cooperative. Notes Patient's wound bed showed signs of good granulation epithelization at this point. I do not see any signs of infection currently and I feel like that the wound VAC is done an awesome job for him at this point to be honest. I do not see any evidence of anything worsening currently. Electronic Signature(s) Signed: 06/18/2021 11:28:26 AM By: Worthy Keeler PA-C Entered By:  Worthy Keeler on 06/18/2021 11:28:25 Norman Palmer ([id number]) -------------------------------------------------------------------------------- Physician Orders Details Patient Name: [name], [name].  Date of Service: 06/18/2021 10:45 AM Medical Record Number: [id number] Patient Account Number: [id number] Date of Birth/Sex: 1941/03/28 (80 y.o. M) Treating RN: Dolan Amen Primary Care Provider: Lelon Huh Other Clinician: Referring Provider: Lelon Huh Treating Provider/Extender: Skipper Cliche in Treatment: 1 Verbal / Phone Orders: No Diagnosis Coding ICD-10 Coding Code Description [phone] Pressure ulcer of left buttock, stage 4 E11.622 Type 2 diabetes mellitus with other skin ulcer G30.8 Other Alzheimer's disease N18.9 Chronic kidney disease, unspecified M62.81 Muscle weakness (generalized) Follow-up Appointments o Return Appointment in 3 weeks. Home Health Wound #1 Left [place] for wound care. May utilize formulary equivalent dressing for wound treatment orders unless otherwise specified. Home Health Nurse may visit PRN to address patientos wound care needs. o Scheduled days for dressing changes to be completed; exception, patient has scheduled wound care visit that day. - ****Dakins wet-to-dry dressing applied in office, home health to coordinate visit after office visits to reapply wound [facility]**** Bathing/ Shower/ Hygiene o May shower; gently cleanse wound with antibacterial soap, rinse and pat dry prior to dressing wounds - May also wash wound with Dakins solution o No tub bath. Off-Loading o Gel wheelchair cushion - Home health to order [facility] bed/mattress o Low air-loss mattress (Group 2) - Home health to order o Turn and reposition every 2 hours Negative Pressure Wound Therapy Wound #1 Left Gluteus o Wound VAC settings at 164mmHg continuous pressure. Use foam to  wound cavity. Please order WHITE foam to fill any tunnel/s and/or undermining when necessary. Change VAC dressing 3 X WEEK. Change canister as indicated when full. - Continue below orders for wound until wound vac arrives o Apply contact layer over base of wound. o Number of foam/gauze pieces used in the dressing = - Use one piece foam and touch the bottom of wound when inserted Wound Treatment Wound #1 - Gluteus Wound Laterality: Left Primary Dressing: Gauze Discharge Instructions: Dakins gauze applied in office today Secondary Dressing: ABD Pad 5x9 (in/in) Discharge Instructions: Cover with ABD pad Secured With: 32M [place] Surgical Tape, 2x2 (in/yd) Discharge Instructions: Secure dressing Electronic Signature(s) Signed: 06/18/2021 4:55:48 PM By: Charlett Nose RN BERLIN, MOKRY Bell ([id number]) Signed: 06/18/2021 4:56:00 PM By: Worthy Keeler PA-C Entered By: Georges Mouse, Minus Breeding on 06/18/2021 11:28:14 Norman Palmer ([id number]) -------------------------------------------------------------------------------- Problem List Details Patient Name: Norman Palmer, [name] Date of Service: 06/18/2021 10:45 AM Medical Record Number: [id number] Patient Account Number: [id number] Date of Birth/Sex: 1940-12-28 (80 y.o. M) Treating RN: Dolan Amen Primary Care Provider: Lelon Huh Other Clinician: Referring Provider: Lelon Huh Treating Provider/Extender: Skipper Cliche in Treatment: 1 Active Problems ICD-10 Encounter Code Description Active Date MDM Diagnosis [phone] Pressure ulcer of left buttock, stage 4 06/10/2021 No Yes E11.622 Type 2 diabetes mellitus with other skin ulcer 06/10/2021 No Yes G30.8 Other Alzheimer's disease 06/10/2021 No Yes N18.9 Chronic kidney disease, unspecified 06/10/2021 No Yes M62.81 Muscle weakness (generalized) 06/10/2021 No Yes Inactive Problems Resolved Problems Electronic Signature(s) Signed: 06/18/2021 11:02:28 AM By:  Worthy Keeler PA-C Entered By: Worthy Keeler on 06/18/2021 11:02:28 Norman Palmer ([id number]) -------------------------------------------------------------------------------- Progress Note Details Patient Name: [name] Date of Service: 06/18/2021 10:45 AM Medical Record Number: [id number] Patient Account Number: [id number] Date of Birth/Sex: 1941-11-24 (80 y.o. 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 This occurred when he was actually in a skilled nursing facility his wife tells me at [facility]. She was very upset about this. She recently took him home he unfortunately does have Alzheimer's which seems to be advancing. She notes that currently he seems to be getting worse in regard to his anger and outburst. He consistently during the time that was he was in the office today was calling his wife by explosives but I will put in the chart but nonetheless I do not think this is him she states this is nothing like how he was before the Alzheimer's caused some any changes in him. She also notes however that he also seems to be worse than normal she is concerned that he may have an issue here with a urinary tract infection is recently had 1 and she feels like that might be returning. The patient is generally weak. He also has a history of diabetes mellitus type 2, Alzheimer's disease and chronic kidney disease although she does not know how severe. 06/18/2021 upon evaluation today patient appears to be doing well with regard to his wound. In fact the wound VAC seems to be doing  an excellent job for him. There is no signs of active infection at this time which is great news. No fevers, chills, nausea, vomiting, or diarrhea. Objective Constitutional Obese and well-hydrated in no acute distress. Vitals Time Taken: 10:23 AM, Height: 72 in, Weight: 282 lbs, BMI: 38.2, Temperature: 96.1 F, Pulse: 84 bpm, Respiratory Rate: 18 breaths/min, Blood Pressure: 112/46 mmHg. Respiratory normal breathing without difficulty. Psychiatric this patient is able to make decisions and demonstrates good insight into disease process. Alert and Oriented x 3. pleasant and cooperative. General Notes: Patient's wound bed showed signs of good granulation epithelization at this point. I do not see any signs of infection currently and I feel like that the wound VAC is done an awesome job for him at this point to be honest. I do not see any evidence of anything worsening currently. Integumentary (Hair, Skin) Wound #1 status is Open. Original cause of wound was Pressure Injury. The date acquired was: 04/15/2021. The wound has been in treatment 1 weeks. The wound is located on the Left Gluteus. The wound measures 3.5cm length x 1.9cm width x 2.9cm depth; 5.223cm^2 area and 15.146cm^3 volume. There is muscle and Fat Layer (Subcutaneous Tissue) exposed. There is tunneling at 11:00 with a maximum distance of 6cm. There is a large amount of serosanguineous drainage noted. The wound margin is well defined and not attached to the wound base. There is small (1-33%) red granulation within the wound bed. There is a large (67-100%) amount of necrotic tissue within the wound bed including Adherent Slough. Assessment Active Problems DONNOVAN, STAMOUR ([id number]) ICD-10 Pressure ulcer of left buttock, stage 4 Type 2 diabetes mellitus with other skin ulcer Other Alzheimer's disease Chronic kidney disease, unspecified Muscle weakness (generalized) Plan Follow-up Appointments: Return Appointment in 3  weeks. Home Health: Wound #1 Left Gluteus: [place] for wound care. May utilize formulary equivalent dressing for wound treatment orders unless otherwise specified. Home Health Nurse may visit PRN to address patient s wound care needs. Scheduled days for dressing changes to be completed; exception, patient has scheduled wound care visit  that day. - ****Dakins wet-to-dry dressing applied in office, home health to coordinate visit after office visits to reapply wound [facility]**** Bathing/ Shower/ Hygiene: May shower; gently cleanse wound with antibacterial soap, rinse and pat dry prior to dressing wounds - May also wash wound with Dakins solution No tub bath. Off-Loading: Gel wheelchair cushion - Home health to order Hospital bed/mattress Low air-loss mattress (Group 2) - Home health to order Turn and reposition every 2 hours Negative Pressure Wound Therapy: Wound #1 Left Gluteus: Wound VAC settings at 182mmHg continuous pressure. Use foam to wound cavity. Please order WHITE foam to fill any tunnel/s and/or undermining when necessary. Change VAC dressing 3 X WEEK. Change canister as indicated when full. - Continue below orders for wound until wound vac arrives Apply contact layer over base of wound. Number of foam/gauze pieces used in the dressing = - Use one piece foam and touch the bottom of wound when inserted WOUND #1: - Gluteus Wound Laterality: Left Primary Dressing: Gauze Discharge Instructions: Dakins gauze applied in office today Secondary Dressing: ABD Pad 5x9 (in/in) Discharge Instructions: Cover with ABD pad Secured With: 64M Medipore H Soft Cloth Surgical Tape, 2x2 (in/yd) Discharge Instructions: Secure dressing 1. Would recommend that we go ahead and have the patient continue with the wound VAC. I think this is doing a great job and overall I am extremely pleased. 2. I am also can recommend we use the Dakin's moistened gauze just  here in the office until home health comes out to get the wound VAC placed again. 3. I am also going to recommend appropriate continued offloading I think the patient is doing excellent at this point. We will see patient back for reevaluation in 3 weeks here in the clinic. If anything worsens or changes patient will contact our office for additional recommendations. Electronic Signature(s) Signed: 06/18/2021 11:29:24 AM By: Worthy Keeler PA-C Entered By: Worthy Keeler on 06/18/2021 11:29:23 ERRON, WENGERT ([id number]) -------------------------------------------------------------------------------- SuperBill Details Patient Name: Norman Palmer, [name] Date of Service: 06/18/2021 Medical Record Number: [id number] Patient Account Number: [id number] Date of Birth/Sex: Jun 01, 1941 (80 y.o. M) Treating RN: Dolan Amen Primary Care Provider: Lelon Huh Other Clinician: Referring Provider: Lelon Huh Treating Provider/Extender: Skipper Cliche in Treatment: 1 Diagnosis Coding ICD-10 Codes Code Description [phone] Pressure ulcer of left buttock, stage 4 E11.622 Type 2 diabetes mellitus with other skin ulcer G30.8 Other Alzheimer's disease N18.9 Chronic kidney disease, unspecified M62.81 Muscle weakness (generalized) Facility Procedures CPT4 Code: 55974163 Description: 99213 - WOUND CARE VISIT-LEV 3 EST PT Modifier: Quantity: 1 Physician Procedures CPT4 Code: 8453646 Description: 80321 - WC PHYS LEVEL 4 - EST PT Modifier: Quantity: 1 CPT4 Code: Description: ICD-10 Diagnosis Description L89.324 Pressure ulcer of left buttock, stage 4 E11.622 Type 2 diabetes mellitus with other skin ulcer G30.8 Other Alzheimer's disease N18.9 Chronic kidney disease, unspecified Modifier: Quantity: Electronic Signature(s) Signed: 06/18/2021 11:29:39 AM By: Worthy Keeler PA-C Entered By: Worthy Keeler on 06/18/2021 11:29:39

## 2021-06-18 NOTE — Telephone Encounter (Signed)
Spoke with wife about what hospice physicians reply.  They would like palliative to continue following closely.  She had to leave before we could set up next appointment.  Will have office reach out to her to schedule appointment in 4-6 weeks. Norman Palmer K. Olena Heckle NP

## 2021-06-18 NOTE — Telephone Encounter (Signed)
  Notes to clinic:  medication filled by a historical provider  Review for continued use and refill    Requested Prescriptions  Pending Prescriptions Disp Refills   triamterene-hydrochlorothiazide (DYAZIDE) 37.5-25 MG capsule [Pharmacy Med Name: TRIAMTERENE-HCTZ 37.5-25 MG CAP] 90 capsule     Sig: TAKE 1 CAPSULE BY MOUTH EACH MORNING      Cardiovascular: Diuretic Combos Failed - 06/18/2021  8:15 AM      Failed - Cr in normal range and within 360 days    Creat  Date Value Ref Range Status  10/02/2017 1.15 0.70 - 1.18 mg/dL Final    Comment:    For patients >61 years of age, the reference limit for Creatinine is approximately 13% higher for people identified as African-American. .    Creatinine, Ser  Date Value Ref Range Status  05/07/2021 1.36 (H) 0.76 - 1.27 mg/dL Final          Failed - Last BP in normal range    BP Readings from Last 1 Encounters:  06/17/21 (!) 102/48          Passed - K in normal range and within 360 days    Potassium  Date Value Ref Range Status  05/07/2021 5.1 3.5 - 5.2 mmol/L Final  05/02/2015 4.3 mmol/L Final          Passed - Na in normal range and within 360 days    Sodium  Date Value Ref Range Status  05/07/2021 136 134 - 144 mmol/L Final  05/02/2015 139  Final          Passed - Ca in normal range and within 360 days    Calcium  Date Value Ref Range Status  05/07/2021 9.6 8.6 - 10.2 mg/dL Final          Passed - Valid encounter within last 6 months    Recent Outpatient Visits           1 month ago Essential hypertension   [facility] Birdie Sons, MD   5 months ago Contusion of right front wall of thorax, initial encounter   [facility] Birdie Sons, MD   10 months ago Type 2 diabetes mellitus with diabetic neuropathy, with long-term current use of insulin [facility])   [facility] Birdie Sons, MD   1 year ago [place], Donald E, MD   3  years ago Urinary tract infection with hematuria, site unspecified   [place], Kirstie Peri, MD       Future Appointments             In 3 weeks Fisher, Kirstie Peri, MD [facility] [place]   In 3 weeks Debroah Loop, [facility]

## 2021-06-21 NOTE — Telephone Encounter (Signed)
Dr. Caryn Section, patient is requesting stronger pain medication. Any recommendations? There are no available appts next week. Please advise. Thanks!

## 2021-06-21 NOTE — Telephone Encounter (Signed)
He would need to be evaluated before pain medication can be sent in.  Can be reviewed by PCP next week when he is in the office.

## 2021-06-21 NOTE — Progress Notes (Signed)
ELSON, ULBRICH ([id number]) Visit Report for 06/18/2021 Arrival Information Details Patient Name: Norman Palmer, [name] Date of Service: 06/18/2021 10:45 AM Medical Record Number: [id number] Patient Account Number: [id number] Date of Birth/Sex: 1941/03/20 (79 y.o. M) Treating RN: Carlene Coria Primary Care Khloe Hunkele: Lelon Huh Other Clinician: Referring Peytin Dechert: Lelon Huh Treating Kegan Mckeithan/Extender: Skipper Cliche in Treatment: 1 Visit Information History Since Last Visit All ordered tests and consults were completed: No Patient Arrived: Stretcher Added or deleted any medications: No Arrival Time: 10:43 Any new allergies or adverse reactions: No Accompanied By: wife Had a fall or experienced change in No Transfer Assistance: None activities of daily living that may affect Patient Identification Verified: Yes risk of falls: Secondary Verification Process Completed: Yes Signs or symptoms of abuse/neglect since last visito No Patient Requires Transmission-Based Precautions: No Hospitalized since last visit: No Patient Has Alerts: No Implantable device outside of the clinic excluding No cellular tissue based products placed in the center since last visit: Has Dressing in Place as Prescribed: Yes Pain Present Now: No Electronic Signature(s) Signed: 06/20/2021 6:25:03 PM By: Carlene Coria RN Entered By: Carlene Coria on 06/18/2021 10:48:42 Doree Fudge ([id number]) -------------------------------------------------------------------------------- Clinic Level of Care Assessment Details Patient Name: [name] Date of Service: 06/18/2021 10:45 AM Medical Record Number: [id number] Patient Account Number: [id number] Date of Birth/Sex: 01-May-1941 (79 y.o. M) Treating RN: Dolan Amen Primary Care Merriam Brandner: Lelon Huh Other Clinician: Referring Teriah Muela: Lelon Huh Treating Shuan Statzer/Extender: Skipper Cliche in Treatment: 1 Clinic Level of Care  Assessment Items TOOL 4 Quantity Score X - Use when only an EandM is performed on FOLLOW-UP visit 1 0 ASSESSMENTS - Nursing Assessment / Reassessment X - Reassessment of Co-morbidities (includes updates in patient status) 1 10 X- 1 5 Reassessment of Adherence to Treatment Plan ASSESSMENTS - Wound and Skin Assessment / Reassessment X - Simple Wound Assessment / Reassessment - one wound 1 5 []  - 0 Complex Wound Assessment / Reassessment - multiple wounds []  - 0 Dermatologic / Skin Assessment (not related to wound area) ASSESSMENTS - Focused Assessment []  - Circumferential Edema Measurements - multi extremities 0 []  - 0 Nutritional Assessment / Counseling / Intervention []  - 0 Lower Extremity Assessment (monofilament, tuning fork, pulses) []  - 0 Peripheral Arterial Disease Assessment (using hand held doppler) ASSESSMENTS - Ostomy and/or Continence Assessment and Care []  - Incontinence Assessment and Management 0 []  - 0 Ostomy Care Assessment and Management (repouching, etc.) PROCESS - Coordination of Care X - Simple Patient / Family Education for ongoing care 1 15 []  - 0 Complex (extensive) Patient / Family Education for ongoing care []  - 0 Staff obtains Programmer, systems, Records, Test Results / Process Orders []  - 0 Staff telephones HHA, Nursing Homes / Clarify orders / etc []  - 0 Routine Transfer to another Facility (non-emergent condition) []  - 0 Routine Hospital Admission (non-emergent condition) []  - 0 New Admissions / Biomedical engineer / Ordering NPWT, Apligraf, etc. []  - 0 Emergency Hospital Admission (emergent condition) X- 1 10 Simple Discharge Coordination []  - 0 Complex (extensive) Discharge Coordination PROCESS - Special Needs []  - Pediatric / Minor Patient Management 0 []  - 0 Isolation Patient Management []  - 0 Hearing / Language / Visual special needs []  - 0 Assessment of Community assistance (transportation, D/C planning, etc.) []  - 0 Additional  assistance / Altered mentation []  - 0 Support Surface(s) Assessment (bed, cushion, seat, etc.) INTERVENTIONS - Wound Cleansing / Measurement EDILBERTO, ROOSEVELT M. ([id number]) X- 1 5 Simple Wound Cleansing -  one wound []  - 0 Complex Wound Cleansing - multiple wounds X- 1 5 Wound Imaging (photographs - any number of wounds) []  - 0 Wound Tracing (instead of photographs) X- 1 5 Simple Wound Measurement - one wound []  - 0 Complex Wound Measurement - multiple wounds INTERVENTIONS - Wound Dressings []  - Small Wound Dressing one or multiple wounds 0 X- 1 15 Medium Wound Dressing one or multiple wounds []  - 0 Large Wound Dressing one or multiple wounds []  - 0 Application of Medications - topical []  - 0 Application of Medications - injection INTERVENTIONS - Miscellaneous []  - External ear exam 0 []  - 0 Specimen Collection (cultures, biopsies, blood, body fluids, etc.) []  - 0 Specimen(s) / Culture(s) sent or taken to Lab for analysis []  - 0 Patient Transfer (multiple staff / Civil Service fast streamer / Similar devices) []  - 0 Simple Staple / Suture removal (25 or less) []  - 0 Complex Staple / Suture removal (26 or more) []  - 0 Hypo / Hyperglycemic Management (close monitor of Blood Glucose) []  - 0 Ankle / Brachial Index (ABI) - do not check if billed separately X- 1 5 Vital Signs Has the patient been seen at the hospital within the last three years: Yes Total Score: 80 Level Of Care: New/Established - Level 3 Electronic Signature(s) Signed: 06/18/2021 4:55:48 PM By: Georges Mouse, Minus Breeding RN Entered By: Georges Mouse, Kenia on 06/18/2021 11:28:50 Doree Fudge ([id number]) -------------------------------------------------------------------------------- Encounter Discharge Information Details Patient Name: [name] Date of Service: 06/18/2021 10:45 AM Medical Record Number: [id number] Patient Account Number: [id number] Date of Birth/Sex: Apr 30, 1941 (79 y.o. M) Treating RN:  Donnamarie Poag Primary Care Yolando Gillum: Lelon Huh Other Clinician: Referring Kiaria Quinnell: Lelon Huh Treating Real Cona/Extender: Skipper Cliche in Treatment: 1 Encounter Discharge Information Items Discharge Condition: Stable Ambulatory Status: Stretcher Discharge Destination: Home Transportation: Ambulance Accompanied By: wife Schedule Follow-up Appointment: Yes Clinical Summary of Care: Electronic Signature(s) Signed: 06/18/2021 1:52:30 PM By: Donnamarie Poag Entered By: Donnamarie Poag on 06/18/2021 11:33:50 Doree Fudge ([id number]) -------------------------------------------------------------------------------- Lower Extremity Assessment Details Patient Name: TOAN, MORT. Date of Service: 06/18/2021 10:45 AM Medical Record Number: [id number] Patient Account Number: [id number] Date of Birth/Sex: 04/30/1941 (79 y.o. M) Treating RN: Carlene Coria Primary Care Carrell Palmatier: Lelon Huh Other Clinician: Referring Dornell Grasmick: Lelon Huh Treating Mirissa Lopresti/Extender: Skipper Cliche in Treatment: 1 Electronic Signature(s) Signed: 06/20/2021 6:25:03 PM By: Carlene Coria RN Entered By: Carlene Coria on 06/18/2021 11:01:23 Doree Fudge ([id number]) -------------------------------------------------------------------------------- Multi Wound Chart Details Patient Name: ROMANI, WILBON. Date of Service: 06/18/2021 10:45 AM Medical Record Number: [id number] Patient Account Number: [id number] Date of Birth/Sex: [birth year] (79 y.o. M) Treating RN: Dolan Amen Primary Care Tannor Pyon: Lelon Huh Other Clinician: Referring Iosefa Weintraub: Lelon Huh Treating Sherita Decoste/Extender: Skipper Cliche in Treatment: 1 Vital Signs Height(in): 72 Pulse(bpm): 84 Weight(lbs): 282 Blood Pressure(mmHg): 112/46 Body Mass Index(BMI): 38 Temperature(F): 96.1 Respiratory Rate(breaths/min): 18 Photos: [N/A:N/A] Wound Location: Left Gluteus N/A N/A Wounding Event: Pressure Injury N/A  N/A Primary Etiology: Pressure Ulcer N/A N/A Comorbid History: Hypertension, Type II Diabetes, N/A N/A History of pressure wounds, Dementia Date Acquired: 04/15/2021 N/A N/A Weeks of Treatment: 1 N/A N/A Wound Status: Open N/A N/A Measurements L x W x D (cm) 3.5x1.9x2.9 N/A N/A Area (cm) : 5.223 N/A N/A Volume (cm) : 15.146 N/A N/A % Reduction in Area: 6.70% N/A N/A % Reduction in Volume: 54.90% N/A N/A Position 1 (o'clock): 11 Maximum Distance 1 (cm): 6 Tunneling: Yes N/A N/A Classification: Category/Stage IV N/A N/A Exudate  Amount: Large N/A N/A Exudate Type: Serosanguineous N/A N/A Exudate Color: red, brown N/A N/A Wound Margin: Well defined, not attached N/A N/A Granulation Amount: Small (1-33%) N/A N/A Granulation Quality: Red N/A N/A Necrotic Amount: Large (67-100%) N/A N/A Exposed Structures: Fat Layer (Subcutaneous Tissue): N/A N/A Yes Muscle: Yes Fascia: No Tendon: No Joint: No Bone: No Epithelialization: None N/A N/A Treatment Notes Electronic Signature(s) Signed: 06/18/2021 4:55:48 PM By: Georges Mouse, Minus Breeding RN Entered By: Georges Mouse, Kenia on 06/18/2021 11:21:04 SAKIB, NOGUEZ ([id number]) CONSTANT, MANDEVILLE ([id number]) -------------------------------------------------------------------------------- [place] Details Patient Name: ASAPH, SERENA. Date of Service: 06/18/2021 10:45 AM Medical Record Number: [id number] Patient Account Number: [id number] Date of Birth/Sex: 31-Aug-1941 (79 y.o. M) Treating RN: Dolan Amen Primary Care Correen Bubolz: Lelon Huh Other Clinician: Referring Atlantis Delong: Lelon Huh Treating Byrne Capek/Extender: Skipper Cliche in Treatment: 1 Active Inactive Pressure Nursing Diagnoses: Knowledge deficit related to causes and risk factors for pressure ulcer development Knowledge deficit related to management of pressures ulcers Potential for impaired tissue integrity related to pressure,  friction, moisture, and shear Goals: Patient will remain free of pressure ulcers Date Initiated: 06/10/2021 Target Resolution Date: 07/15/2021 Goal Status: Active Patient/caregiver will verbalize risk factors for pressure ulcer development Date Initiated: 06/10/2021 Target Resolution Date: 07/15/2021 Goal Status: Active Interventions: Assess: immobility, friction, shearing, incontinence upon admission and as needed Assess offloading mechanisms upon admission and as needed Assess potential for pressure ulcer upon admission and as needed Notes: Wound/Skin Impairment Nursing Diagnoses: Impaired tissue integrity Knowledge deficit related to ulceration/compromised skin integrity Goals: Patient/caregiver will verbalize understanding of skin care regimen Date Initiated: 06/10/2021 Target Resolution Date: 07/15/2021 Goal Status: Active Ulcer/skin breakdown will have a volume reduction of 30% by week 4 Date Initiated: 06/10/2021 Target Resolution Date: 07/15/2021 Goal Status: Active Ulcer/skin breakdown will have a volume reduction of 50% by week 8 Date Initiated: 06/10/2021 Target Resolution Date: 08/19/2021 Goal Status: Active Ulcer/skin breakdown will have a volume reduction of 80% by week 12 Date Initiated: 06/10/2021 Target Resolution Date: 09/09/2021 Goal Status: Active Interventions: Assess patient/caregiver ability to obtain necessary supplies Assess patient/caregiver ability to perform ulcer/skin care regimen upon admission and as needed Assess ulceration(s) every visit Treatment Activities: Patient referred to home care : 06/10/2021 Notes: AVERILL, PONS ([id number]) Electronic Signature(s) Signed: 06/18/2021 4:55:48 PM By: Georges Mouse, Minus Breeding RN Entered By: Georges Mouse, Minus Breeding on 06/18/2021 11:20:57 Doree Fudge ([id number]) -------------------------------------------------------------------------------- Pain Assessment Details Patient Name: DWANE, ANDRES. Date of Service: 06/18/2021 10:45 AM Medical Record Number: [id number] Patient Account Number: [id number] Date of Birth/Sex: 30-Nov-1941 (79 y.o. M) Treating RN: Carlene Coria Primary Care Chanette Demo: Lelon Huh Other Clinician: Referring Yarely Bebee: Lelon Huh Treating Prem Coykendall/Extender: Skipper Cliche in Treatment: 1 Active Problems Location of Pain Severity and Description of Pain Patient Has Paino No Site Locations Pain Management and Medication Current Pain Management: Electronic Signature(s) Signed: 06/20/2021 6:25:03 PM By: Carlene Coria RN Entered By: Carlene Coria on 06/18/2021 10:49:18 Doree Fudge ([id number]) -------------------------------------------------------------------------------- Patient/Caregiver Education Details Patient Name: JAWARA, LATORRE. Date of Service: 06/18/2021 10:45 AM Medical Record Number: [id number] Patient Account Number: [id number] Date of Birth/Gender: 04/08/1941 (79 y.o. M) Treating RN: Dolan Amen Primary Care Physician: Lelon Huh Other Clinician: Referring Physician: Lelon Huh Treating Physician/Extender: Skipper Cliche in Treatment: 1 Education Assessment Education Provided To: Caregiver wife Education Topics Provided Wound/Skin Impairment: Methods: Explain/Verbal Responses: State content correctly Electronic Signature(s) Signed: 06/18/2021 4:55:48 PM By: Georges Mouse, Minus Breeding RN Entered By: Georges Mouse, Minus Breeding on 06/18/2021 11:29:11  LEKEITH, WULF [place] ([id number]) -------------------------------------------------------------------------------- Wound Assessment Details Patient Name: MARCOANTONIO, LEGAULT. Date of Service: 06/18/2021 10:45 AM Medical Record Number: [id number] Patient Account Number: [id number] Date of Birth/Sex: 1941/08/22 (79 y.o. M) Treating RN: Carlene Coria Primary Care Joeanna Howdyshell: Lelon Huh Other Clinician: Referring Gowri Suchan: Lelon Huh Treating Clemma Johnsen/Extender: Skipper Cliche in Treatment: 1 Wound Status Wound Number: 1 Primary Pressure Ulcer Etiology: Wound Location: Left Gluteus Wound Status: Open Wounding Event: Pressure Injury Comorbid Hypertension, Type II Diabetes, History of pressure Date Acquired: 04/15/2021 History: wounds, Dementia Weeks Of Treatment: 1 Clustered Wound: No Photos Wound Measurements Length: (cm) 3.5 % Reduc Width: (cm) 1.9 % Reduc Depth: (cm) 2.9 Epithel Area: (cm) 5.223 Tunnel Volume: (cm) 15.146 Pos Maxi tion in Area: 6.7% tion in Volume: 54.9% ialization: None ing: Yes ition (o'clock): 11 mum Distance: (cm) 6 Wound Description Classification: Category/Stage IV Foul Od Wound Margin: Well defined, not attached Slough/ Exudate Amount: Large Exudate Type: Serosanguineous Exudate Color: red, brown or After Cleansing: No Fibrino Yes Wound Bed Granulation Amount: Small (1-33%) Exposed Structure Granulation Quality: Red Fascia Exposed: No Necrotic Amount: Large (67-100%) Fat Layer (Subcutaneous Tissue) Exposed: Yes Necrotic Quality: Adherent Slough Tendon Exposed: No Muscle Exposed: Yes Necrosis of Muscle: No Joint Exposed: No Bone Exposed: No Treatment Notes Wound #1 (Gluteus) Wound Laterality: Left Cleanser JOLAN, UPCHURCH ([id number]) Peri-Wound Care Topical Primary Dressing Gauze Discharge Instruction: Dakins gauze applied in office today Secondary Dressing ABD Pad 5x9 (in/in) Discharge Instruction: Cover with ABD pad Secured With 82M Medipore H Soft Cloth Surgical Tape, 2x2 (in/yd) Discharge Instruction: Secure dressing Compression Wrap Compression Stockings Add-Ons Electronic Signature(s) Signed: 06/20/2021 6:25:03 PM By: Carlene Coria RN Entered By: Carlene Coria on 06/18/2021 10:59:44 Doree Fudge ([id number]) -------------------------------------------------------------------------------- [place] Details Patient Name: Doree Fudge. Date of Service: 06/18/2021 10:45  AM Medical Record Number: [id number] Patient Account Number: [id number] Date of Birth/Sex: 08/05/1941 (79 y.o. M) Treating RN: Carlene Coria Primary Care Grayson Pfefferle: Lelon Huh Other Clinician: Referring Johan Creveling: Lelon Huh Treating Amairani Shuey/Extender: Skipper Cliche in Treatment: 1 Vital Signs Time Taken: 10:23 Temperature (F): 96.1 Height (in): 72 Pulse (bpm): 84 Weight (lbs): 282 Respiratory Rate (breaths/min): 18 Body Mass Index (BMI): 38.2 Blood Pressure (mmHg): 112/46 Reference Range: 80 - 120 mg / dl Electronic Signature(s) Signed: 06/20/2021 6:25:03 PM By: Carlene Coria RN Entered By: Carlene Coria on 06/18/2021 10:49:11

## 2021-06-25 ENCOUNTER — Ambulatory Visit: Payer: Medicare Other | Admitting: Physician Assistant

## 2021-06-25 MED ORDER — TRAMADOL HCL 50 MG PO TABS: 50.0000 mg | ORAL_TABLET | Freq: Three times a day (TID) | ORAL | 0 refills | Status: DC | PRN

## 2021-07-02 ENCOUNTER — Ambulatory Visit: Payer: Medicare Other | Admitting: Physician Assistant

## 2021-07-02 MED ORDER — TRAMADOL HCL 50 MG PO TABS: 50.0000 mg | ORAL_TABLET | Freq: Three times a day (TID) | ORAL | 2 refills | Status: DC | PRN

## 2021-07-02 NOTE — Addendum Note (Signed)
Addended by: Birdie Sons on: 07/02/2021 02:52 PM   Modules accepted: Orders

## 2021-07-09 ENCOUNTER — Encounter: Payer: Medicare Other | Attending: Physician Assistant | Admitting: Physician Assistant

## 2021-07-09 ENCOUNTER — Other Ambulatory Visit: Payer: Self-pay

## 2021-07-09 ENCOUNTER — Ambulatory Visit: Payer: Medicare Other | Admitting: Physician Assistant

## 2021-07-09 DIAGNOSIS — R456 Violent behavior: Secondary | ICD-10-CM | POA: Diagnosis not present

## 2021-07-09 DIAGNOSIS — N189 Chronic kidney disease, unspecified: Secondary | ICD-10-CM | POA: Insufficient documentation

## 2021-07-09 DIAGNOSIS — E11622 Type 2 diabetes mellitus with other skin ulcer: Secondary | ICD-10-CM | POA: Insufficient documentation

## 2021-07-09 DIAGNOSIS — R0902 Hypoxemia: Secondary | ICD-10-CM | POA: Diagnosis not present

## 2021-07-09 DIAGNOSIS — E1122 Type 2 diabetes mellitus with diabetic chronic kidney disease: Secondary | ICD-10-CM | POA: Diagnosis not present

## 2021-07-09 DIAGNOSIS — M2550 Pain in unspecified joint: Secondary | ICD-10-CM | POA: Diagnosis not present

## 2021-07-09 DIAGNOSIS — Z7401 Bed confinement status: Secondary | ICD-10-CM | POA: Diagnosis not present

## 2021-07-09 DIAGNOSIS — L89324 Pressure ulcer of left buttock, stage 4: Secondary | ICD-10-CM | POA: Diagnosis not present

## 2021-07-09 DIAGNOSIS — I959 Hypotension, unspecified: Secondary | ICD-10-CM | POA: Diagnosis not present

## 2021-07-09 NOTE — Progress Notes (Addendum)
RAVIS, HERNE ([id number]) Visit Report for 07/09/2021 Chief Complaint Document Details Patient Name: Norman Palmer, [name] Date of Service: 07/09/2021 10:30 AM Medical Record Number: [id number] Patient Account Number: [id number] Date of Birth/Sex: 1940-12-30 (80 y.o. M) Treating RN: Dolan Amen Primary Care Provider: Lelon Huh Other Clinician: Referring Provider: Lelon Huh Treating Provider/Extender: Skipper Cliche in Treatment: 4 Information Obtained from: Patient Chief Complaint Pressure ulcer left gluteus Electronic Signature(s) Signed: 07/09/2021 10:25:00 AM By: Worthy Keeler PA-C Entered By: Worthy Keeler on 07/09/2021 10:25:00 Norman Palmer ([id number]) -------------------------------------------------------------------------------- HPI Details Patient Name: Norman Palmer, [name] Date of Service: 07/09/2021 10:30 AM Medical Record Number: [id number] Patient Account Number: [id number] Date of Birth/Sex: [birth year] (80 y.o. 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 This occurred when he was actually in a skilled nursing facility his wife tells me at [facility]. She was very upset about this. She recently took him home he unfortunately does have Alzheimer's which seems to be advancing. She notes that currently he seems to be getting worse in regard to his anger and outburst. He consistently during the time that was he was in the office today was calling his wife by explosives but I will put in the chart but nonetheless I do not think this is him she states this is nothing like how he was before the Alzheimer's caused some any changes in him. She  also notes however that he also seems to be worse than normal she is concerned that he may have an issue here with a urinary tract infection is recently had 1 and she feels like that might be returning. The patient is generally weak. He also has a history of diabetes mellitus type 2, Alzheimer's disease and chronic kidney disease although she does not know how severe. 06/18/2021 upon evaluation today patient appears to be doing well with regard to his wound. In fact the wound VAC seems to be doing an excellent job for him. There is no signs of active infection at this time which is great news. No fevers, chills, nausea, vomiting, or diarrhea. 07/09/2021 upon evaluation today patient's wound actually is showing signs of excellent improvement which is great news. I do not see any evidence of active infection which is also great news and I think the wound VAC is actually doing a great job he seems to be in pretty good spirits today all things considered. This is somewhat surprising as this probably is the best day he has had since have been helping to take care of him from the standpoint of his demeanor. Electronic Signature(s) Signed: 07/09/2021 6:00:00 PM By: Worthy Keeler PA-C Entered By: Worthy Keeler on 07/09/2021 18:00:00 Norman Palmer ([id number]) -------------------------------------------------------------------------------- Physical Exam Details Patient Name: [name] Date of Service: 07/09/2021 10:30 AM Medical Record Number: [id number] Patient Account Number: [id number] Date of Birth/Sex: [birth year] (80 y.o. M) Treating RN: Dolan Amen Primary Care Provider: Lelon Huh Other Clinician: Referring Provider: Lelon Huh Treating Provider/Extender: Skipper Cliche in Treatment: 4 Constitutional Well-nourished and well-hydrated in no acute distress. Respiratory normal breathing without difficulty. Psychiatric this patient is able to make decisions and  demonstrates good insight into disease process. Alert and Oriented x 3. pleasant and cooperative. Notes  Upon inspection patient's wound bed again showed signs of good granulation epithelization. There really is not much of a tunnel here and honestly I think black foam would be perfectly fine to put into this wound but nonetheless the nurse was very concerned about that and was not willing to do that for that reason they are using white foam in the base and then filling with black foam to the top I am okay with that. With that being said this patient does not need a contact layer in the base of the wound there is absolutely no rationale nor reason for this and subsequently I would be very against that being something that we do here to be perfectly honest. We did put in the orders today as well for home health to not put a contact layer down. Electronic Signature(s) Signed: 07/09/2021 6:00:46 PM By: Worthy Keeler PA-C Entered By: Worthy Keeler on 07/09/2021 18:00:46 Norman Palmer ([id number]) -------------------------------------------------------------------------------- Physician Orders Details Patient Name: Norman Palmer, [name] Date of Service: 07/09/2021 10:30 AM Medical Record Number: [id number] Patient Account Number: [id number] Date of Birth/Sex: 1941-02-03 (80 y.o. M) Treating RN: Donnamarie Poag Primary Care Provider: Lelon Huh Other Clinician: Referring Provider: Lelon Huh Treating Provider/Extender: Skipper Cliche in Treatment: 4 Verbal / Phone Orders: No Diagnosis Coding ICD-10 Coding Code Description [id number] Pressure ulcer of left buttock, stage 4 E11.622 Type 2 diabetes mellitus with other skin ulcer G30.8 Other Alzheimer's disease N18.9 Chronic kidney disease, unspecified M62.81 Muscle weakness (generalized) Follow-up Appointments o Return Appointment in 1 month [place] [facility] [place] for wound care. May utilize formulary equivalent dressing for wound treatment orders unless otherwise specified. Home Health Nurse may visit PRN to address patientos wound care needs. - NO CONTACT LAYER IN WOUND VAC UNDER THE BLACK FOAM-there is no bone exposed o Scheduled days for dressing changes to be completed; exception, patient has scheduled wound care visit that day. - ****Dakins wet-to-dry dressing applied in office, home health to coordinate visit after office visits to reapply wound [facility]**** Bathing/ Shower/ Hygiene o May shower; gently cleanse wound with antibacterial soap, rinse and pat dry prior to dressing wounds - May also wash wound with Dakins solution o No tub bath. Off-Loading o Gel wheelchair cushion - Home health to order [facility] bed/mattress o Low air-loss mattress (Group 2) - Home health to order o Turn and reposition every 2 hours Negative Pressure Wound Therapy Wound #1 Left Gluteus o Wound VAC settings at 185mmHg continuous pressure. Use foam to wound cavity. Please order WHITE foam to fill any tunnel/s and/or undermining when necessary. Change VAC dressing 3 X WEEK. Change canister as indicated when full. - Continue below orders for wound Wound is noted with undermining 12-5 with depth at 4.1cm; not a tunnel at this time Ok to use black foam o Home Health Nurse may d/c VAC for s/s of increased infection, significant wound regression, or uncontrolled drainage. [place] at [phone]. o Apply contact layer over base of wound. o Number of foam/gauze pieces used in the dressing = - Use one piece foam and touch the bottom of wound when inserted Wound Treatment Wound #1 - Gluteus Wound Laterality: Left Primary Dressing: Gauze 3 x Per Week/30 Days Discharge Instructions: Dakins gauze applied in office today Secondary Dressing: ABD Pad 5x9 (in/in) 3 x Per Week/30 Days Discharge Instructions: Cover with ABD  pad Secured With: 34M Medipore  H Soft Cloth Surgical Tape, 2x2 (in/yd) 3 x Per Week/30 Days Discharge Instructions: Secure dressing KANAI, BERRIOS ([id number]) Electronic Signature(s) Signed: 07/09/2021 11:08:42 AM By: Donnamarie Poag Signed: 07/09/2021 6:10:04 PM By: Worthy Keeler PA-C Previous Signature: 07/09/2021 11:07:41 AM Version By: Donnamarie Poag Entered By: Donnamarie Poag on 07/09/2021 11:08:27 Norman Palmer ([id number]) -------------------------------------------------------------------------------- Problem List Details Patient Name: Norman Palmer, [name] Date of Service: 07/09/2021 10:30 AM Medical Record Number: [id number] Patient Account Number: [id number] Date of Birth/Sex: July 03, 1941 (80 y.o. M) Treating RN: Dolan Amen Primary Care Provider: Lelon Huh Other Clinician: Referring Provider: Lelon Huh Treating Provider/Extender: Skipper Cliche in Treatment: 4 Active Problems ICD-10 Encounter Code Description Active Date MDM Diagnosis [phone] Pressure ulcer of left buttock, stage 4 06/10/2021 No Yes E11.622 Type 2 diabetes mellitus with other skin ulcer 06/10/2021 No Yes G30.8 Other Alzheimer's disease 06/10/2021 No Yes N18.9 Chronic kidney disease, unspecified 06/10/2021 No Yes M62.81 Muscle weakness (generalized) 06/10/2021 No Yes Inactive Problems Resolved Problems Electronic Signature(s) Signed: 07/09/2021 10:24:53 AM By: Worthy Keeler PA-C Entered By: Worthy Keeler on 07/09/2021 10:24:52 Norman Palmer ([id number]) -------------------------------------------------------------------------------- Progress Note Details Patient Name: [name] Date of Service: 07/09/2021 10:30 AM Medical Record Number: [id number] Patient Account Number: [id number] Date of Birth/Sex: 1941-07-25 (80 y.o. 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 This occurred when he was actually in a skilled nursing facility his wife tells me at [facility]. She was very upset about this. She recently took him home he unfortunately does have Alzheimer's which seems to be advancing. She notes that currently he seems to be getting worse in regard to his anger and outburst. He consistently during the time that was he was in the office today was calling his wife by explosives but I will put in the chart but nonetheless I do not think this is him she states this is nothing like how he was before the Alzheimer's caused some any changes in him. She also notes however that he also seems to be worse than normal she is concerned that he may have an issue here with a urinary tract infection is recently had 1 and she feels like that might be returning. The patient is generally weak. He also has a history of diabetes mellitus type 2, Alzheimer's disease and chronic kidney disease although she does not know how severe. 06/18/2021 upon evaluation today patient appears to be doing well with regard to his wound. In fact the wound VAC seems to be doing an excellent job for him. There is no signs of active infection at this time which is great news. No fevers, chills, nausea, vomiting, or diarrhea. 07/09/2021 upon evaluation today patient's wound actually is showing signs of excellent improvement which is great news. I do not see any evidence of active infection which is also great news and I think the wound VAC is actually doing a great job he seems to be in pretty good spirits today all things considered. This is somewhat surprising as this probably is the best day he has had since have been  helping to take care of him from the standpoint of his demeanor. Objective Constitutional Well-nourished  and well-hydrated in no acute distress. Vitals Time Taken: 10:34 AM, Height: 72 in, Weight: 282 lbs, BMI: 38.2, Temperature: 98.1 F, Pulse: 105 bpm, Respiratory Rate: 18 breaths/min, Blood Pressure: 118/76 mmHg. Respiratory normal breathing without difficulty. Psychiatric this patient is able to make decisions and demonstrates good insight into disease process. Alert and Oriented x 3. pleasant and cooperative. General Notes: Upon inspection patient's wound bed again showed signs of good granulation epithelization. There really is not much of a tunnel here and honestly I think black foam would be perfectly fine to put into this wound but nonetheless the nurse was very concerned about that and was not willing to do that for that reason they are using white foam in the base and then filling with black foam to the top I am okay with that. With that being said this patient does not need a contact layer in the base of the wound there is absolutely no rationale nor reason for this and subsequently I would be very against that being something that we do here to be perfectly honest. We did put in the orders today as well for home health to not put a contact layer down. Integumentary (Hair, Skin) Wound #1 status is Open. Original cause of wound was Pressure Injury. The date acquired was: 04/15/2021. The wound has been in treatment 4 weeks. The wound is located on the Left Gluteus. The wound measures 2.8cm length x 1.6cm width x 2.5cm depth; 3.519cm^2 area and 8.796cm^3 volume. There is muscle and Fat Layer (Subcutaneous Tissue) exposed. There is no tunneling noted, however, there is undermining starting at 12:00 and ending at 5:00 with a maximum distance of 4.1cm. There is a large amount of serosanguineous drainage noted. The wound margin is well defined and not attached to the wound base. There is  large (67-100%) red granulation within the wound bed. There is a small (1-33%) amount of necrotic tissue within the wound bed including [place]. Norman Palmer, Norman Palmer [place]. ([id number]) Assessment Active Problems ICD-10 Pressure ulcer of left buttock, stage 4 Type 2 diabetes mellitus with other skin ulcer Other Alzheimer's disease Chronic kidney disease, unspecified Muscle weakness (generalized) Plan Follow-up Appointments: Return Appointment in 1 month Home Health: Wound #1 Left Gluteus: [place] for wound care. May utilize formulary equivalent dressing for wound treatment orders unless otherwise specified. Home Health Nurse may visit PRN to address patient s wound care needs. - NO CONTACT LAYER IN WOUND VAC UNDER THE BLACK FOAM-there is no bone exposed Scheduled days for dressing changes to be completed; exception, patient has scheduled wound care visit that day. - ****Dakins wet-to-dry dressing applied in office, home health to coordinate visit after office visits to reapply wound [facility]**** Bathing/ Shower/ Hygiene: May shower; gently cleanse wound with antibacterial soap, rinse and pat dry prior to dressing wounds - May also wash wound with Dakins solution No tub bath. Off-Loading: Gel wheelchair cushion - Home health to order Hospital bed/mattress Low air-loss mattress (Group 2) - Home health to order Turn and reposition every 2 hours Negative Pressure Wound Therapy: Wound #1 Left Gluteus: Wound VAC settings at 137mmHg continuous pressure. Use foam to wound cavity. Please order WHITE foam to fill any tunnel/s and/or undermining when necessary. Change VAC dressing 3 X WEEK. Change canister as indicated when full. - Continue below orders for wound Wound is noted with undermining 12-5 with depth at 4.1cm; not a tunnel at this time Ok to use black foam Home  Health Nurse may d/c VAC for s/s of increased infection, significant wound  regression, or uncontrolled drainage. [place] at [phone]. Apply contact layer over base of wound. Number of foam/gauze pieces used in the dressing = - Use one piece foam and touch the bottom of wound when inserted WOUND #1: - Gluteus Wound Laterality: Left Primary Dressing: Gauze 3 x Per Week/30 Days Discharge Instructions: Dakins gauze applied in office today Secondary Dressing: ABD Pad 5x9 (in/in) 3 x Per Week/30 Days Discharge Instructions: Cover with ABD pad Secured With: 63M Medipore H Soft Cloth Surgical Tape, 2x2 (in/yd) 3 x Per Week/30 Days Discharge Instructions: Secure dressing 1. Would recommend currently based on what I am seeing that we go ahead and continue with the wound care measures as before specifically with the wound VAC I think this is doing a great job. 2. I would also recommend continuation of appropriate offloading which his wife and caregivers are trying to stick with it as well. We will see patient back for reevaluation in 1 week here in the clinic. If anything worsens or changes patient will contact our office for additional recommendations. Electronic Signature(s) Signed: 07/09/2021 6:01:07 PM By: Worthy Keeler PA-C Entered By: Worthy Keeler on 07/09/2021 18:01:07 Norman Palmer ([id number]) -------------------------------------------------------------------------------- SuperBill Details Patient Name: Norman Palmer, [name] Date of Service: 07/09/2021 Medical Record Number: [id number] Patient Account Number: [id number] Date of Birth/Sex: [birth year] (80 y.o. M) Treating RN: Donnamarie Poag Primary Care Provider: Lelon Huh Other Clinician: Referring Provider: Lelon Huh Treating Provider/Extender: Skipper Cliche in Treatment: 4 Diagnosis Coding ICD-10 Codes Code Description [phone] Pressure ulcer of left buttock, stage 4 E11.622 Type 2 diabetes mellitus with other skin ulcer G30.8 Other Alzheimer's disease N18.9 Chronic  kidney disease, unspecified M62.81 Muscle weakness (generalized) Facility Procedures CPT4 Code: 23557322 Description: 99213 - WOUND CARE VISIT-LEV 3 EST PT Modifier: Quantity: 1 Physician Procedures CPT4 Code: 0254270 Description: 62376 - WC PHYS LEVEL 3 - EST PT Modifier: Quantity: 1 CPT4 Code: Description: ICD-10 Diagnosis Description L89.324 Pressure ulcer of left buttock, stage 4 E11.622 Type 2 diabetes mellitus with other skin ulcer G30.8 Other Alzheimer's disease N18.9 Chronic kidney disease, unspecified Modifier: Quantity: Electronic Signature(s) Signed: 07/09/2021 6:01:22 PM By: Worthy Keeler PA-C Previous Signature: 07/09/2021 11:07:41 AM Version By: Donnamarie Poag Entered By: Worthy Keeler on 07/09/2021 18:01:22

## 2021-07-10 ENCOUNTER — Telehealth: Payer: Self-pay

## 2021-07-10 NOTE — Telephone Encounter (Signed)
Spoke with wife on phone and due to lack of transportation I have changed appt to virtual for 07/12/21.KW

## 2021-07-10 NOTE — Telephone Encounter (Signed)
Copied from [place] [id number]. Topic: Appointment Scheduling - Scheduling Inquiry for Clinic >> Jul 10, 2021  4:41 PM Yvette Rack wrote: Reason for CRM: Pt spouse stated pt is bed bound and it is difficult to bring him in to the office for an appt. Pt spouse stated she normally has to rent a van to transport pt and she was not able to get a Lucianne Lei this time. Pt spouse requests that the appt scheduled for 07/12/21 be done virtually. Cb# [phone]

## 2021-07-12 ENCOUNTER — Ambulatory Visit: Payer: Self-pay | Admitting: Physician Assistant

## 2021-07-12 ENCOUNTER — Telehealth (INDEPENDENT_AMBULATORY_CARE_PROVIDER_SITE_OTHER): Payer: Medicare Other | Admitting: Family Medicine

## 2021-07-12 DIAGNOSIS — I10 Essential (primary) hypertension: Secondary | ICD-10-CM

## 2021-07-12 DIAGNOSIS — M545 Low back pain, unspecified: Secondary | ICD-10-CM

## 2021-07-12 DIAGNOSIS — F0391 Unspecified dementia with behavioral disturbance: Secondary | ICD-10-CM

## 2021-07-12 DIAGNOSIS — Z7409 Other reduced mobility: Secondary | ICD-10-CM

## 2021-07-12 DIAGNOSIS — L89303 Pressure ulcer of unspecified buttock, stage 3: Secondary | ICD-10-CM | POA: Diagnosis not present

## 2021-07-12 NOTE — Progress Notes (Signed)
MyChart Video Visit    Virtual Visit via Video Note   This visit type was conducted due to national recommendations for restrictions regarding the COVID-19 Pandemic (e.g. social distancing) in an effort to limit this patient's exposure and mitigate transmission in our community. This patient is at least at moderate risk for complications without adequate follow up. This format is felt to be most appropriate for this patient at this time. Physical exam was limited by quality of the video and audio technology used for the visit.   Patient location: [facility] Provider location: home  I discussed the limitations of evaluation and management by telemedicine and the availability of in person appointments. The patient expressed understanding and agreed to proceed.  Patient: Norman Palmer   DOB: March 30, 1941   80 y.o. Male  MRN: [id number] Visit Date: 07/12/2021  Today's healthcare provider: Lelon Huh, MD   No chief complaint on file.  Subjective    HPI  Follow up for pain  The patient was last seen for this 2 months ago. Changes made at last visit include starting tramadol '50mg'$  1 every 8 hours as needed for pain.  He reports excellent compliance with treatment. He feels that condition is Improved. He is not having side effects.   He has advanced dementia with erratic behavior. He was prescribed Haldol which just made him extremely somnolent. He since been prescribed alprazolam which was helpful, but has not had to take for a few weeks. His wife has been given him brownies with CBD which has been working well.   He has been working with physical therapy and going to wound clinic for sacral wound which they report has greatly improved. He is still mostly bed bound.   His wife reports they check his sugars about once a week and is usually in the low to mid 100s. She states his resting heart rate on pulse ox tends to run in the 110s, although PT also checks and told her it was normal. He  has not complained at all of any chest pains or palpitations.  -----------------------------------------------------------------------------------------     Medications: Outpatient Medications Prior to Visit  Medication Sig   ALPRAZolam (XANAX) 1 MG tablet TAKE 1 TABLET BY MOUTH THREE TIMES DAILY AS NEEDED   cephALEXin (KEFLEX) 250 MG capsule Take 1 capsule (250 mg total) by mouth daily. For UTI prevention   donepezil (ARICEPT) 10 MG tablet Take 10 mg by mouth at bedtime.    finasteride (PROSCAR) 5 MG tablet Take 1 tablet (5 mg total) by mouth daily.   haloperidol (HALDOL) 2 MG tablet Take 1 tablet (2 mg total) by mouth every 6 (six) hours as needed for agitation.   insulin NPH Human (NOVOLIN N) 100 UNIT/ML injection Inject 30 Units into the skin daily.   lisinopril (ZESTRIL) 20 MG tablet TAKE 1 TABLET BY MOUTH DAILY   memantine (NAMENDA) 10 MG tablet Take 1 tablet (10 mg total) by mouth 2 (two) times daily.   metFORMIN (GLUCOPHAGE) 1000 MG tablet TAKE ONE TABLET TWICE DAILY WITH MEALS (Patient taking differently: Take 1,000 mg by mouth 2 (two) times daily with a meal.)   risperiDONE (RISPERDAL) 0.5 MG tablet Take 1 tablet (0.5 mg total) by mouth at bedtime.   sertraline (ZOLOFT) 50 MG tablet Take 50 mg by mouth daily.   silodosin (RAPAFLO) 8 MG CAPS capsule Take 1 capsule (8 mg total) by mouth daily with breakfast.   silver sulfADIAZINE (SILVADENE) 1 % cream Apply 1 application  topically daily.   traMADol (ULTRAM) 50 MG tablet Take 1 tablet (50 mg total) by mouth every 8 (eight) hours as needed.   triamterene-hydrochlorothiazide (DYAZIDE) 37.5-25 MG capsule TAKE 1 CAPSULE BY MOUTH EACH MORNING   Vitamin D, Ergocalciferol, (DRISDOL) 1.25 MG (50000 UNIT) CAPS capsule TAKE 1 CAPSULE BY MOUTH ONCE A WEEK   zinc oxide 20 % ointment Apply 1 application topically as needed for irritation. Apply to affected area daily   No facility-administered medications prior to visit.    Review of Systems   Constitutional: Negative.   Respiratory: Negative.    Cardiovascular:  Positive for leg swelling. Negative for chest pain and palpitations.  Gastrointestinal: Negative.   Neurological:  Negative for dizziness, light-headedness and headaches.     Objective    There were no vitals taken for this visit.   Physical Exam  Awake, alert, oriented x 1. In no apparent distress    Assessment & Plan    1. Dementia with behavioral disturbance, unspecified dementia type (HCC) Severe but stable. Seen by Dr. Manuella Ghazi in June.   2. Limited mobility Continue home health services.   3. Low back pain, unspecified back pain laterality, unspecified chronicity, unspecified whether sciatica present Much better with tramadol.   4. Pressure injury of buttock, stage 3, unspecified laterality ([place]) Doing well with wound care.   5. Essential hypertension Lisinopril has been hold for about 3 weeks due to low blood pressure at home, and his wife reports BP has since been very good in the 110s and 120s. Will d/c lisinopril       I discussed the assessment and treatment plan with the patient. The patient was provided an opportunity to ask questions and all were answered. The patient agreed with the plan and demonstrated an understanding of the instructions.   The patient was advised to call back or seek an in-person evaluation if the symptoms worsen or if the condition fails to improve as anticipated.  I provided 12 minutes of non-face-to-face time during this encounter.  The entirety of the information documented in the History of Present Illness, Review of Systems and Physical Exam were personally obtained by me. Portions of this information were initially documented by the CMA and reviewed by me for thoroughness and accuracy.    Lelon Huh, MD [facility] [phone] (phone) [phone] (fax)  [place]

## 2021-07-15 ENCOUNTER — Telehealth: Payer: Self-pay | Admitting: Student

## 2021-07-16 ENCOUNTER — Ambulatory Visit: Payer: Medicare Other | Admitting: Physician Assistant

## 2021-07-16 DIAGNOSIS — I10 Essential (primary) hypertension: Secondary | ICD-10-CM | POA: Diagnosis not present

## 2021-07-16 DIAGNOSIS — L89626 Pressure-induced deep tissue damage of left heel: Secondary | ICD-10-CM | POA: Diagnosis not present

## 2021-07-16 DIAGNOSIS — N401 Enlarged prostate with lower urinary tract symptoms: Secondary | ICD-10-CM | POA: Diagnosis not present

## 2021-07-16 DIAGNOSIS — Z794 Long term (current) use of insulin: Secondary | ICD-10-CM | POA: Diagnosis not present

## 2021-07-16 DIAGNOSIS — R262 Difficulty in walking, not elsewhere classified: Secondary | ICD-10-CM | POA: Diagnosis not present

## 2021-07-16 DIAGNOSIS — F0391 Unspecified dementia with behavioral disturbance: Secondary | ICD-10-CM | POA: Diagnosis not present

## 2021-07-16 DIAGNOSIS — R339 Retention of urine, unspecified: Secondary | ICD-10-CM | POA: Diagnosis not present

## 2021-07-16 DIAGNOSIS — Z7984 Long term (current) use of oral hypoglycemic drugs: Secondary | ICD-10-CM | POA: Diagnosis not present

## 2021-07-16 DIAGNOSIS — E119 Type 2 diabetes mellitus without complications: Secondary | ICD-10-CM | POA: Diagnosis not present

## 2021-07-16 DIAGNOSIS — L89323 Pressure ulcer of left buttock, stage 3: Secondary | ICD-10-CM | POA: Diagnosis not present

## 2021-07-18 ENCOUNTER — Telehealth: Payer: Self-pay | Admitting: *Deleted

## 2021-07-18 NOTE — Telephone Encounter (Signed)
Norman Palmer, the patient's [facility] physical therapist is currently working with the patient at bedside. He is reporting the patient's heart rate due to outside of parameters.  HR 125 bpm, B/P 95/64 while sitting on edge of bed, Respirations are good.PT reporting this is the patient's first time OOB in awhile. He is tolerating the stand up lift for 3 minutes now. Reporting the patient as stable and in good spirits.

## 2021-07-18 NOTE — Telephone Encounter (Signed)
Ret'd call to patient's wife Norman Palmer, to offer to schedule a Palliative f/u visit, no answer - left message requesting a return call to schedule visit.

## 2021-07-19 ENCOUNTER — Other Ambulatory Visit: Payer: Self-pay | Admitting: Family Medicine

## 2021-07-19 DIAGNOSIS — F0391 Unspecified dementia with behavioral disturbance: Secondary | ICD-10-CM

## 2021-07-19 NOTE — Telephone Encounter (Signed)
FYI. Thanks.

## 2021-07-25 ENCOUNTER — Telehealth: Payer: Self-pay | Admitting: Student

## 2021-07-25 NOTE — Telephone Encounter (Signed)
Spoke with patient's wife, Santiago Glad, and have scheduled a Palliative f/u visit for 07/30/21 @ 2 PM.

## 2021-07-30 ENCOUNTER — Encounter (INDEPENDENT_AMBULATORY_CARE_PROVIDER_SITE_OTHER): Payer: Medicare Other | Admitting: Family Medicine

## 2021-07-30 ENCOUNTER — Other Ambulatory Visit: Payer: Medicare Other | Admitting: Student

## 2021-07-30 ENCOUNTER — Other Ambulatory Visit: Payer: Self-pay

## 2021-07-30 DIAGNOSIS — I10 Essential (primary) hypertension: Secondary | ICD-10-CM | POA: Diagnosis not present

## 2021-07-30 DIAGNOSIS — F0391 Unspecified dementia with behavioral disturbance: Secondary | ICD-10-CM | POA: Diagnosis not present

## 2021-07-30 DIAGNOSIS — L89323 Pressure ulcer of left buttock, stage 3: Secondary | ICD-10-CM

## 2021-07-30 DIAGNOSIS — R339 Retention of urine, unspecified: Secondary | ICD-10-CM

## 2021-07-30 DIAGNOSIS — L89303 Pressure ulcer of unspecified buttock, stage 3: Secondary | ICD-10-CM

## 2021-07-30 DIAGNOSIS — Z7984 Long term (current) use of oral hypoglycemic drugs: Secondary | ICD-10-CM

## 2021-07-30 DIAGNOSIS — R262 Difficulty in walking, not elsewhere classified: Secondary | ICD-10-CM | POA: Diagnosis not present

## 2021-07-30 DIAGNOSIS — Z515 Encounter for palliative care: Secondary | ICD-10-CM | POA: Diagnosis not present

## 2021-07-30 DIAGNOSIS — R52 Pain, unspecified: Secondary | ICD-10-CM | POA: Diagnosis not present

## 2021-07-30 DIAGNOSIS — E119 Type 2 diabetes mellitus without complications: Secondary | ICD-10-CM | POA: Diagnosis not present

## 2021-07-30 DIAGNOSIS — L89626 Pressure-induced deep tissue damage of left heel: Secondary | ICD-10-CM

## 2021-07-30 DIAGNOSIS — Z794 Long term (current) use of insulin: Secondary | ICD-10-CM

## 2021-07-30 DIAGNOSIS — N401 Enlarged prostate with lower urinary tract symptoms: Secondary | ICD-10-CM | POA: Diagnosis not present

## 2021-07-30 NOTE — Progress Notes (Signed)
[facility] Consult Note Telephone: [phone]  Fax: [phone]    Date of encounter: 07/30/21 PATIENT NAME: Norman Palmer [address] [place] [address]   [phone] (home) [phone] (work) DOB: Nov 22, 1941 MRN: [id number] PRIMARY CARE PROVIDER:    Birdie Sons, MD,  [address] [place] [address] [phone]  REFERRING PROVIDER:   Birdie Sons, [address] [place],  [place] [address] [phone]  RESPONSIBLE PARTY:    Contact Information     Name Relation Home Work [place] [phone]  [phone]   Lucian, Bas[place]   [phone]   Janice,Tiffany Relative   [phone]        I met face to face with patient and family in the home. Palliative Care was asked to follow this patient by consultation request of  Fisher, Kirstie Peri, MD to address advance care planning and complex medical decision making. This is a follow up visit.                                   ASSESSMENT AND PLAN / RECOMMENDATIONS:   Advance Care Planning/Goals of Care: Goals include to maximize quality of life and symptom management.   Code status: DNR  Symptom Management/Plan:  Dementia-patient dependent for all adl's. Wife and caregivers to continue redirecting, reorienting as needed. Monitor for aspiration; modified texture diet encouraged. Aspiration precautions. Monitor for falls safety. Continue PT for as directed for weakness, impaired mobility. Continue medications for dementia, agitation as directed.   Stage 3 wound-wound to buttocks. Area is improving her wife. Continue wound vac as directed; [facility] for wound vac care. Continue air mattress to bed; turn and reposition every two hours.   Pain-patient with chronic lower back pain; continue tramadol as directed.   Follow up Palliative Care Visit: Palliative care will continue to follow for complex  medical decision making, advance care planning, and clarification of goals. Return in 8 weeks or prn.  I spent 40 minutes providing this consultation. More than 50% of the time in this consultation was spent in counseling and care coordination.   PPS: 30%  HOSPICE ELIGIBILITY/DIAGNOSIS: TBD  Chief Complaint: Palliative Medicine follow up visit.   HISTORY OF PRESENT ILLNESS:  Norman Palmer is a 80 y.o. year old male  with Dementia, essential hypertension, stage 3 pressure injury to buttocks, low back pain, BPH, OSA, hx of basal cell carcinoma.   Patient resides at home with his wife. He has daily caregivers. He is dependent for all adl's. Patient currently has PT and SN with [facility]. Patient with stage 3 wound to buttocks; wound vac currently in place. Wound is improving, decreased in size per wife. Air mattress to bed. Good appetite reported; patient with dysphagia. Patient is drinking thin liquids, finely cut/chopped food. Wife states behaviors, agitation has improved. He is no longer hitting at caregivers. He does yell out when being turned/repositioned; receiving tramadol with relief. Wife states PT is working on [employer], he requires assist to stand, unable to ambulate. No falls reported. No sleep difficulty reported; he naps throughout the day. HPI and ROS mostly obtained from wife due to patient's dementia. He does answer some yes/no questions but does become agitated during assessment.   History obtained from review of EMR, discussion with primary team, and interview with family, facility staff/caregiver and/or [name]  I reviewed available labs, medications, imaging, studies and related documents from the EMR.  Records reviewed and summarized above.    Physical Exam: Pulse 78, 136/73, resp 18, sats 93% on room air.  Constitutional: NAD General: obese  EYES: anicteric sclera, lids intact, no discharge  ENMT: intact hearing, oral mucous membranes moist, dentition  intact CV: S1S2, RRR, trace LE edema Pulmonary: LCTA, no increased work of breathing, no cough, room air Abdomen: normo-active BS + 4 quadrants, soft and non tender GU: deferred MSK: non- ambulatory Skin: warm and dry, no rashes or wounds on visible skin Neuro: generalized weakness, A & O x 2 Psych: non-anxious affect Hem/lymph/immuno: no widespread bruising   Thank you for the opportunity to participate in the care of [name]  The palliative care team will continue to follow. Please call our office at [phone] if we can be of additional assistance.   Ezekiel Slocumb, NP   COVID-19 PATIENT SCREENING TOOL Asked and negative response unless otherwise noted:   Have you had symptoms of covid, tested positive or been in contact with someone with symptoms/positive test in the past 5-10 days? No

## 2021-07-31 ENCOUNTER — Other Ambulatory Visit: Payer: Self-pay

## 2021-08-01 ENCOUNTER — Telehealth: Payer: Self-pay

## 2021-08-01 NOTE — Telephone Encounter (Signed)
Copied from [place] [id number]. Topic: General - Other >> Aug 01, 2021 12:41 PM Valere Dross wrote: Reason for CRM: Nicki from [facility] faxed over a order needing to be signed by provider, and asked if it could be signed and faxed back. Please advise.

## 2021-08-02 NOTE — Telephone Encounter (Signed)
Dr. Caryn Section, do you know if this has been done?

## 2021-08-04 ENCOUNTER — Encounter: Payer: Self-pay | Admitting: Family Medicine

## 2021-08-04 DIAGNOSIS — G9009 Other idiopathic peripheral autonomic neuropathy: Secondary | ICD-10-CM

## 2021-08-05 MED ORDER — GABAPENTIN 300 MG PO CAPS: 300.0000 mg | ORAL_CAPSULE | Freq: Every day | ORAL | 1 refills | Status: DC

## 2021-08-06 ENCOUNTER — Encounter: Payer: Medicare Other | Attending: Physician Assistant | Admitting: Physician Assistant

## 2021-08-06 ENCOUNTER — Other Ambulatory Visit: Payer: Self-pay

## 2021-08-06 DIAGNOSIS — G308 Other Alzheimer's disease: Secondary | ICD-10-CM | POA: Insufficient documentation

## 2021-08-06 DIAGNOSIS — R404 Transient alteration of awareness: Secondary | ICD-10-CM | POA: Diagnosis not present

## 2021-08-06 DIAGNOSIS — E1151 Type 2 diabetes mellitus with diabetic peripheral angiopathy without gangrene: Secondary | ICD-10-CM | POA: Diagnosis not present

## 2021-08-06 DIAGNOSIS — R0902 Hypoxemia: Secondary | ICD-10-CM | POA: Diagnosis not present

## 2021-08-06 DIAGNOSIS — L89312 Pressure ulcer of right buttock, stage 2: Secondary | ICD-10-CM | POA: Diagnosis not present

## 2021-08-06 DIAGNOSIS — F0280 Dementia in other diseases classified elsewhere without behavioral disturbance: Secondary | ICD-10-CM | POA: Insufficient documentation

## 2021-08-06 DIAGNOSIS — E1122 Type 2 diabetes mellitus with diabetic chronic kidney disease: Secondary | ICD-10-CM | POA: Diagnosis not present

## 2021-08-06 DIAGNOSIS — I129 Hypertensive chronic kidney disease with stage 1 through stage 4 chronic kidney disease, or unspecified chronic kidney disease: Secondary | ICD-10-CM | POA: Insufficient documentation

## 2021-08-06 DIAGNOSIS — L89324 Pressure ulcer of left buttock, stage 4: Secondary | ICD-10-CM | POA: Diagnosis not present

## 2021-08-06 DIAGNOSIS — N189 Chronic kidney disease, unspecified: Secondary | ICD-10-CM | POA: Diagnosis not present

## 2021-08-06 DIAGNOSIS — Z7401 Bed confinement status: Secondary | ICD-10-CM | POA: Diagnosis not present

## 2021-08-06 DIAGNOSIS — E11622 Type 2 diabetes mellitus with other skin ulcer: Secondary | ICD-10-CM | POA: Diagnosis not present

## 2021-08-06 DIAGNOSIS — R4182 Altered mental status, unspecified: Secondary | ICD-10-CM | POA: Diagnosis not present

## 2021-08-06 MED ORDER — CELECOXIB 100 MG PO CAPS: 100.0000 mg | ORAL_CAPSULE | Freq: Every day | ORAL | 3 refills | Status: DC

## 2021-08-06 NOTE — Addendum Note (Signed)
Addended by: Lelon Huh E on: 08/06/2021 12:05 PM   Modules accepted: Orders

## 2021-08-06 NOTE — Progress Notes (Addendum)
ZACKARY, KOSE ([id number]) Visit Report for 08/06/2021 Chief Complaint Document Details Patient Name: Norman Palmer, [name] Date of Service: 08/06/2021 11:00 AM Medical Record Number: [id number] Patient Account Number: [id number] Date of Birth/Sex: 04/10/1941 (79 y.o. M) Treating RN: Donnamarie Poag Primary Care Provider: Lelon Huh Other Clinician: Referring Provider: Lelon Huh Treating Provider/Extender: Skipper Cliche in Treatment: 8 Information Obtained from: Patient Chief Complaint Pressure ulcer left gluteus and right gluteus Electronic Signature(s) Signed: 08/06/2021 11:34:22 AM By: Worthy Keeler PA-C Previous Signature: 08/06/2021 11:09:02 AM Version By: Worthy Keeler PA-C Entered By: Worthy Keeler on 08/06/2021 11:34:21 Doree Fudge ([id number]) -------------------------------------------------------------------------------- HPI Details Patient Name: [name] Date of Service: 08/06/2021 11:00 AM Medical Record Number: [id number] Patient Account Number: [id number] Date of Birth/Sex: 06/09/1941 (79 y.o. 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 This occurred when he was actually in a skilled nursing facility his wife tells me at [facility]. She was very upset about this. She recently took him home he unfortunately does have Alzheimer's which seems to be advancing. She notes that currently he seems to be getting worse in regard to his anger and outburst. He consistently during the time that was he was in the office today was calling his wife by explosives but I will put in the chart but nonetheless I do not think this is him she states this  is nothing like how he was before the Alzheimer's caused some any changes in him. She also notes however that he also seems to be worse than normal she is concerned that he may have an issue here with a urinary tract infection is recently had 1 and she feels like that might be returning. The patient is generally weak. He also has a history of diabetes mellitus type 2, Alzheimer's disease and chronic kidney disease although she does not know how severe. 06/18/2021 upon evaluation today patient appears to be doing well with regard to his wound. In fact the wound VAC seems to be doing an excellent job for him. There is no signs of active infection at this time which is great news. No fevers, chills, nausea, vomiting, or diarrhea. 07/09/2021 upon evaluation today patient's wound actually is showing signs of excellent improvement which is great news. I do not see any evidence of active infection which is also great news and I think the wound VAC is actually doing a great job he seems to be in pretty good spirits today all things considered. This is somewhat surprising as this probably is the best day he has had since have been helping to take care of him from the standpoint of his demeanor. 08/06/2021 upon evaluation today patient appears to be doing pretty well in regard to his wounds. The wound VAC seems to be doing excellent in regard to the left gluteal region. The right gluteal area which is somewhat open today does not appear to be bad at all and overall I think that the patient is doing quite well in that regard. Fortunately there is no signs of active infection at this time. No fevers, chills, nausea, vomiting, or diarrhea. Electronic Signature(s) Signed: 08/06/2021 5:43:22 PM By: Worthy Keeler PA-C Entered By: Worthy Keeler on 08/06/2021 17:43:21 Hepler,  Marnette Burgess ([id number]) -------------------------------------------------------------------------------- Physical Exam Details Patient Name:  [name] Date of Service: 08/06/2021 11:00 AM Medical Record Number: [id number] Patient Account Number: [id number] Date of Birth/Sex: [birth year] (79 y.o. M) Treating RN: Donnamarie Poag Primary Care Provider: Lelon Huh Other Clinician: Referring Provider: Lelon Huh Treating Provider/Extender: Skipper Cliche in Treatment: 8 Constitutional Well-nourished and well-hydrated in no acute distress. Respiratory normal breathing without difficulty. Psychiatric Patient is not able to cooperate in decision making regarding care. Patient has dementia. patient is confused. Notes . The patient and his wife are in agreement with that plan. Upon inspection patient's wound bed showed signs of good granulation and epithelization at this point. I do think that the wound VAC is making excellent progress as far as the wound in general is concerned and overall I think that we will get a continue with the plan as far as that is concerned Electronic Signature(s) Signed: 08/06/2021 5:43:45 PM By: Worthy Keeler PA-C Entered By: Worthy Keeler on 08/06/2021 17:43:45 Doree Fudge ([id number]) -------------------------------------------------------------------------------- Physician Orders Details Patient Name: [name], [name]. Date of Service: 08/06/2021 11:00 AM Medical Record Number: [id number] Patient Account Number: [id number] Date of Birth/Sex: [birth year] (79 y.o. M) Treating RN: Donnamarie Poag Primary Care Provider: Lelon Huh Other Clinician: Referring Provider: Lelon Huh Treating Provider/Extender: Skipper Cliche in Treatment: 8 Verbal / Phone Orders: No Diagnosis Coding ICD-10 Coding Code Description [id number] Pressure ulcer of left buttock, stage 4 L89.312 Pressure ulcer of right buttock, stage 2 E11.622 Type 2 diabetes mellitus with other skin ulcer G30.8 Other Alzheimer's disease N18.9 Chronic kidney disease, unspecified M62.81 Muscle weakness  (generalized) Follow-up Appointments o Return Appointment in 1 month [place] #1 Left [place] for wound care. May utilize formulary equivalent dressing for wound treatment orders unless otherwise specified. Home Health Nurse may visit PRN to address patientos wound care needs. - NO CONTACT LAYER IN WOUND VAC UNDER THE BLACK FOAM-there is no bone exposed o Scheduled days for dressing changes to be completed; exception, patient has scheduled wound care visit that day. - ****Dakins wet-to-dry dressing applied in office, home health to coordinate visit after office visits to reapply wound [facility]**** Bathing/ Shower/ Hygiene o May shower; gently cleanse wound with antibacterial soap, rinse and pat dry prior to dressing wounds - May also wash wound with Dakins solution o No tub bath. Off-Loading o Gel wheelchair cushion - Home health to order [facility] bed/mattress o Low air-loss mattress (Group 2) - Home health to order o Turn and reposition every 2 hours Negative Pressure Wound Therapy Wound #1 Left Gluteus o Wound VAC settings at 181mHg continuous pressure. Use foam to wound cavity. Please order WHITE foam to fill any tunnel/s and/or undermining when necessary. Change VAC dressing 3 X WEEK. Change canister as indicated when full. - Continue below orders for wound Wound is noted with undermining 12-5 with depth at 4.1cm; not a tunnel at this time Ok to use black foam o Home Health Nurse may d/c VAC for s/s of increased infection, significant wound regression, or uncontrolled drainage. [place] [phone] o Apply contact layer over base of wound. o Number of foam/gauze pieces used in the dressing = - Use one piece foam and touch the bottom of wound when inserted Wound Treatment Wound #1 - Gluteus Wound Laterality: Left Primary Dressing: Gauze 3 x Per Week/30  Days Discharge Instructions: Dakins gauze applied in office  today Secondary Dressing: ABD Pad 5x9 (in/in) 3 x Per Week/30 Days Discharge Instructions: Cover with ABD pad Secured With: 24M Medipore H Soft Cloth Surgical Tape, 2x2 (in/yd) 3 x Per Week/30 Days Discharge Instructions: Secure dressing AVROM, GRINDEL ([id number]) Wound #2 - Gluteus Wound Laterality: Right Cleanser: Soap and Water 1 x Per Day/30 Days Discharge Instructions: Gently cleanse wound with antibacterial soap, rinse and pat dry prior to dressing wounds Peri-Wound Care: 24M Cavilon Advanced Skin Protectant, 0.27 (ml) 1 x Per Day/30 Days Discharge Instructions: may use for skin protection in peri area Primary Dressing: AquacelAg Advantage Dressing, 2X2 (in/in) 1 x Per Day/30 Days Discharge Instructions: Apply to wound as directed Secondary Dressing: Mepilex Border Flex, 4x4 (in/in) 1 x Per Day/30 Days Discharge Instructions: Apply to wound as directed. Do not cut. Electronic Signature(s) Signed: 08/06/2021 11:42:01 AM By: Donnamarie Poag Signed: 08/08/2021 4:48:07 PM By: Worthy Keeler PA-C Entered By: Donnamarie Poag on 08/06/2021 11:39:35 Doree Fudge ([id number]) -------------------------------------------------------------------------------- Problem List Details Patient Name: ANFERNEE, OLA. Date of Service: 08/06/2021 11:00 AM Medical Record Number: [id number] Patient Account Number: [id number] Date of Birth/Sex: [birth year] (79 y.o. M) Treating RN: Donnamarie Poag Primary Care Provider: Lelon Huh Other Clinician: Referring Provider: Lelon Huh Treating Provider/Extender: Skipper Cliche in Treatment: 8 Active Problems ICD-10 Encounter Code Description Active Date MDM Diagnosis [phone] Pressure ulcer of left buttock, stage 4 06/10/2021 No Yes L89.312 Pressure ulcer of right buttock, stage 2 08/06/2021 No Yes E11.622 Type 2 diabetes mellitus with other skin ulcer 06/10/2021 No Yes G30.8 Other Alzheimer's  disease 06/10/2021 No Yes N18.9 Chronic kidney disease, unspecified 06/10/2021 No Yes M62.81 Muscle weakness (generalized) 06/10/2021 No Yes Inactive Problems Resolved Problems Electronic Signature(s) Signed: 08/06/2021 11:34:07 AM By: Worthy Keeler PA-C Previous Signature: 08/06/2021 11:08:56 AM Version By: Worthy Keeler PA-C Entered By: Worthy Keeler on 08/06/2021 11:34:06 Doree Fudge ([id number]) -------------------------------------------------------------------------------- Progress Note Details Patient Name: Doree Fudge. Date of Service: 08/06/2021 11:00 AM Medical Record Number: [id number] Patient Account Number: [id number] Date of Birth/Sex: [birth year] (79 y.o. 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 This occurred when he was actually in a skilled nursing facility his wife tells me at [facility]. She was very upset about this. She recently took him home he unfortunately does have Alzheimer's which seems to be advancing. She notes that currently he seems to be getting worse in regard to his anger and outburst. He consistently during the time that was he was in the office today was calling his wife by explosives but I will put in the chart but nonetheless I do not think this is him she states this is nothing like how he was before the Alzheimer's caused some any changes in him. She also notes however that he also seems to be worse than normal she is concerned that he may have an issue here with a urinary tract infection is recently had 1 and she feels like  that might be returning. The patient is generally weak. He also has a history of diabetes mellitus type 2, Alzheimer's disease and chronic kidney disease although she does not know how severe. 06/18/2021 upon  evaluation today patient appears to be doing well with regard to his wound. In fact the wound VAC seems to be doing an excellent job for him. There is no signs of active infection at this time which is great news. No fevers, chills, nausea, vomiting, or diarrhea. 07/09/2021 upon evaluation today patient's wound actually is showing signs of excellent improvement which is great news. I do not see any evidence of active infection which is also great news and I think the wound VAC is actually doing a great job he seems to be in pretty good spirits today all things considered. This is somewhat surprising as this probably is the best day he has had since have been helping to take care of him from the standpoint of his demeanor. 08/06/2021 upon evaluation today patient appears to be doing pretty well in regard to his wounds. The wound VAC seems to be doing excellent in regard to the left gluteal region. The right gluteal area which is somewhat open today does not appear to be bad at all and overall I think that the patient is doing quite well in that regard. Fortunately there is no signs of active infection at this time. No fevers, chills, nausea, vomiting, or diarrhea. Objective Constitutional Well-nourished and well-hydrated in no acute distress. Vitals Time Taken: 11:15 AM, Height: 72 in, Weight: 282 lbs, BMI: 38.2, Temperature: 97.9 F, Pulse: 97 bpm, Respiratory Rate: 16 breaths/min, Blood Pressure: 93/60 mmHg. Respiratory normal breathing without difficulty. Psychiatric Patient is not able to cooperate in decision making regarding care. Patient has dementia. patient is confused. General Notes: . The patient and his wife are in agreement with that plan. Upon inspection patient's wound bed  showed signs of good granulation and epithelization at this point. I do think that the wound VAC is making excellent progress as far as the wound in general is concerned and overall I think that we will get a continue with the plan as far as that is concerned Integumentary (Hair, Skin) Wound #1 status is Open. Original cause of wound was Pressure Injury. The date acquired was: 04/15/2021. The wound has been in treatment 8 weeks. The wound is located on the Left Gluteus. The wound measures 1.5cm length x 1cm width x 2.5cm depth; 1.178cm^2 area and 2.945cm^3 volume. There is muscle and Fat Layer (Subcutaneous Tissue) exposed. There is no undermining noted, however, there is tunneling at 11:00 with a maximum distance of 4cm. There is a large amount of serosanguineous drainage noted. The wound margin is well defined and not attached to the wound base. There is large (67-100%) red, hyper - granulation within the wound bed. There is no necrotic tissue within the ROMERO, GIUNTA. ([id number]) wound bed. Wound #2 status is Open. Original cause of wound was Gradually Appeared. The date acquired was: 07/22/2021. The wound is located on the Right Gluteus. The wound measures 0.4cm length x 0.3cm width x 0.1cm depth; 0.094cm^2 area and 0.009cm^3 volume. There is no tunneling or undermining noted. There is a medium amount of serosanguineous drainage noted. The wound margin is flat and intact. There is large (67-100%) red, pink granulation within the wound bed. There is no necrotic tissue within the wound bed. Assessment Active Problems ICD-10 Pressure ulcer of left buttock, stage 4 Pressure ulcer of right buttock, stage 2 Type 2 diabetes mellitus with other skin ulcer Other Alzheimer's disease Chronic kidney disease, unspecified Muscle weakness (generalized) Plan Follow-up Appointments: Return Appointment in 1 month Home Health: Wound #1 Left Gluteus: Home  Health Company: - [facility] for wound care. May utilize formulary equivalent dressing for wound treatment orders unless otherwise specified. Home Health Nurse may visit PRN to address patient s wound care needs. - NO CONTACT LAYER IN WOUND VAC UNDER THE BLACK FOAM-there is no bone exposed Scheduled days for dressing changes to be completed; exception, patient has scheduled wound care visit that day. - ****Dakins wet-to-dry dressing applied in office, home health to coordinate visit after office visits to reapply wound [facility]**** Bathing/ Shower/ Hygiene: May shower; gently cleanse wound with antibacterial soap, rinse and pat dry prior to dressing wounds - May also wash wound with Dakins solution No tub bath. Off-Loading: Gel wheelchair cushion - Home health to order Hospital bed/mattress Low air-loss mattress (Group 2) - Home health to order Turn and reposition every 2 hours Negative Pressure Wound Therapy: Wound #1 Left Gluteus: Wound VAC settings at 120mHg continuous pressure. Use foam to wound cavity. Please order WHITE foam to fill any tunnel/s and/or undermining when necessary. Change VAC dressing 3 X WEEK. Change canister as indicated when full. - Continue below orders for wound Wound is noted with undermining 12-5 with depth at 4.1cm; not a tunnel at this time Ok to use black foam Home Health Nurse may d/c VAC for s/s of increased infection, significant wound regression, or uncontrolled drainage. [place] [phone] Apply contact layer over base of wound. Number of foam/gauze pieces used in the dressing = - Use one piece foam and touch the bottom of wound when inserted WOUND #1: - Gluteus Wound Laterality: Left Primary Dressing: Gauze 3 x Per Week/30 Days Discharge Instructions: Dakins gauze applied in office today Secondary Dressing: ABD Pad 5x9 (in/in) 3 x Per Week/30 Days Discharge Instructions: Cover with ABD pad Secured With: 856M Medipore H Soft Cloth Surgical Tape, 2x2  (in/yd) 3 x Per Week/30 Days Discharge Instructions: Secure dressing WOUND #2: - Gluteus Wound Laterality: Right Cleanser: Soap and Water 1 x Per Day/30 Days Discharge Instructions: Gently cleanse wound with antibacterial soap, rinse and pat dry prior to dressing wounds Peri-Wound Care: 856M Cavilon Advanced Skin Protectant, 0.27 (ml) 1 x Per Day/30 Days Discharge Instructions: may use for skin protection in peri area Primary Dressing: AquacelAg Advantage Dressing, 2X2 (in/in) 1 x Per Day/30 Days Discharge Instructions: Apply to wound as directed Secondary Dressing: Mepilex Border Flex, 4x4 (in/in) 1 x Per Day/30 Days Discharge Instructions: Apply to wound as directed. Do not cut. [place], [place] ([id number] 1. Recommend currently that we go ahead and continue with the wound care measures as before with regard to the wound VAC on the left gluteal region I think this is doing all some. 2. Also can recommend that we use a silver alginate dressing with a border foam dressing to cover on the right gluteal region. 3. I would recommend continued and appropriate offloading. 4. I am also can recommend that Cavilon advanced can be used under the pannus region and in the groin area to help protect from moisture which I think will likely do well in that regard. We will see patient back for reevaluation in 1 week here in the clinic. If anything worsens or changes patient will contact our office for additional recommendations. Electronic Signature(s) Signed: 08/06/2021 5:44:12 PM By: SWorthy KeelerPA-C Entered By: SWorthy Keeleron 08/06/2021 17:44:12 JDoree Fudge([id number] -------------------------------------------------------------------------------- SuperBill Details Patient Name: JRANGEL, CANNADA Date of Service: 08/06/2021 Medical Record Number: [id number] Account Number:  [id number] Date of Birth/Sex: November 08, 1941 (80 y.o. M) Treating RN: Donnamarie Poag Primary Care Provider:  Lelon Huh Other Clinician: Referring Provider: Lelon Huh Treating Provider/Extender: Skipper Cliche in Treatment: 8 Diagnosis Coding ICD-10 Codes Code Description [id number] Pressure ulcer of left buttock, stage 4 L89.312 Pressure ulcer of right buttock, stage 2 E11.622 Type 2 diabetes mellitus with other skin ulcer G30.8 Other Alzheimer's disease N18.9 Chronic kidney disease, unspecified M62.81 Muscle weakness (generalized) Facility Procedures CPT4 Code: [id number] Description: 99214 - WOUND CARE VISIT-LEV 4 EST PT Modifier: Quantity: 1 Physician Procedures CPT4 Code: [id number] Description: [id number] - WC PHYS LEVEL 4 - EST PT Modifier: Quantity: 1 CPT4 Code: Description: ICD-10 Diagnosis Description L89.324 Pressure ulcer of left buttock, stage 4 L89.312 Pressure ulcer of right buttock, stage 2 E11.622 Type 2 diabetes mellitus with other skin ulcer G30.8 Other Alzheimer's disease Modifier: Quantity: Electronic Signature(s) Signed: 08/06/2021 5:44:38 PM By: Worthy Keeler PA-C Previous Signature: 08/06/2021 11:42:01 AM Version By: Donnamarie Poag Entered By: Worthy Keeler on 08/06/2021 17:44:37

## 2021-08-06 NOTE — Progress Notes (Addendum)
Norman Palmer, Norman Palmer ([id number]) Visit Report for 08/06/2021 Arrival Information Details Patient Name: Norman Palmer, [name] Date of Service: 08/06/2021 11:00 AM Medical Record Number: [id number] Patient Account Number: [id number] Date of Birth/Sex: 01-Jul-1941 (79 y.o. M) Treating RN: Donnamarie Poag Primary Care : Lelon Huh Other Clinician: Referring : Lelon Huh Treating /Extender: Skipper Cliche in Treatment: 8 Visit Information History Since Last Visit Added or deleted any medications: No Patient Arrived: Stretcher Had Norman Palmer fall or experienced change in No Arrival Time: 11:15 activities of daily living that may affect Accompanied By: wife risk of falls: Transfer Assistance: Transfer Board Hospitalized since last visit: No Patient Identification Verified: Yes Has Dressing in Place as Prescribed: Yes Secondary Verification Process Completed: Yes Pain Present Now: No Patient Requires Transmission-Based Precautions: No Patient Has Alerts: Yes Patient Alerts: DIABETIC Electronic Signature(s) Signed: 08/06/2021 11:42:01 AM By: Donnamarie Poag Entered By: Donnamarie Poag on 08/06/2021 11:15:38 Doree Fudge ([id number]) -------------------------------------------------------------------------------- Clinic Level of Care Assessment Details Patient Name: Norman Palmer, [name] Date of Service: 08/06/2021 11:00 AM Medical Record Number: [id number] Patient Account Number: [id number] Date of Birth/Sex: February 16, 1941 (79 y.o. M) Treating RN: Donnamarie Poag Primary Care : Lelon Huh Other Clinician: Referring : Lelon Huh Treating /Extender: Skipper Cliche in Treatment: 8 Clinic Level of Care Assessment Items TOOL 4 Quantity Score [] - Use when only an EandM is performed on FOLLOW-UP visit 0 ASSESSMENTS - Nursing Assessment / Reassessment [] - Reassessment of Co-morbidities (includes updates in patient status) 0 [] - 0 Reassessment of  Adherence to Treatment Plan ASSESSMENTS - Wound and Skin Assessment / Reassessment [] - Simple Wound Assessment / Reassessment - one wound 0 X- 2 5 Complex Wound Assessment / Reassessment - multiple wounds [] - 0 Dermatologic / Skin Assessment (not related to wound area) ASSESSMENTS - Focused Assessment [] - Circumferential Edema Measurements - multi extremities 0 [] - 0 Nutritional Assessment / Counseling / Intervention [] - 0 Lower Extremity Assessment (monofilament, tuning fork, pulses) [] - 0 Peripheral Arterial Disease Assessment (using hand held doppler) ASSESSMENTS - Ostomy and/or Continence Assessment and Care [] - Incontinence Assessment and Management 0 [] - 0 Ostomy Care Assessment and Management (repouching, etc.) PROCESS - Coordination of Care X - Simple Patient / Family Education for ongoing care 1 15 [] - 0 Complex (extensive) Patient / Family Education for ongoing care [] - 0 Staff obtains Programmer, systems, Records, Test Results / Process Orders X- 1 10 Staff telephones HHA, Nursing Homes / Clarify orders / etc [] - 0 Routine Transfer to another Facility (non-emergent condition) [] - 0 Routine Hospital Admission (non-emergent condition) [] - 0 New Admissions / Biomedical engineer / Ordering NPWT, Apligraf, etc. [] - 0 Emergency Hospital Admission (emergent condition) X- 1 10 Simple Discharge Coordination [] - 0 Complex (extensive) Discharge Coordination PROCESS - Special Needs [] - Pediatric / Minor Patient Management 0 [] - 0 Isolation Patient Management [] - 0 Hearing / Language / Visual special needs [] - 0 Assessment of Community assistance (transportation, D/C planning, etc.) [] - 0 Additional assistance / Altered mentation [] - 0 Support Surface(s) Assessment (bed, cushion, seat, etc.) INTERVENTIONS - Wound Cleansing / Measurement Norman Palmer, Norman Palmer M. ([id number]) [] - 0 Simple Wound Cleansing - one wound X- 2 5 Complex Wound Cleansing -  multiple wounds X- 1 5 Wound Imaging (photographs - any number of wounds) [] - 0 Wound Tracing (instead of photographs) [] - 0 Simple Wound Measurement - one wound X- 2 5 Complex  Wound Measurement - multiple wounds INTERVENTIONS - Wound Dressings [] - Small Wound Dressing one or multiple wounds 0 [] - 0 Medium Wound Dressing one or multiple wounds X- 2 20 Large Wound Dressing one or multiple wounds [] - 0 Application of Medications - topical [] - 0 Application of Medications - injection INTERVENTIONS - Miscellaneous [] - External ear exam 0 [] - 0 Specimen Collection (cultures, biopsies, blood, body fluids, etc.) [] - 0 Specimen(s) / Culture(s) sent or taken to Lab for analysis X- 1 10 Patient Transfer (multiple staff / Harrel Lemon Lift / Similar devices) [] - 0 Simple Staple / Suture removal (25 or less) [] - 0 Complex Staple / Suture removal (26 or more) [] - 0 Hypo / Hyperglycemic Management (close monitor of Blood Glucose) [] - 0 Ankle / Brachial Index (ABI) - do not check if billed separately X- 1 5 Vital Signs Has the patient been seen at the hospital within the last three years: Yes Total Score: 125 Level Of Care: New/Established - Level 4 Electronic Signature(s) Signed: 08/06/2021 11:42:01 AM By: Donnamarie Poag Entered By: Donnamarie Poag on 08/06/2021 11:40:36 Doree Fudge ([id number]) -------------------------------------------------------------------------------- Encounter Discharge Information Details Patient Name: Norman Palmer, [name] Date of Service: 08/06/2021 11:00 AM Medical Record Number: [id number] Patient Account Number: [id number] Date of Birth/Sex: February 15, 1941 (79 y.o. M) Treating RN: Donnamarie Poag Primary Care : Lelon Huh Other Clinician: Referring : Lelon Huh Treating /Extender: Skipper Cliche in Treatment: 8 Encounter Discharge Information Items Discharge Condition: Stable Ambulatory Status: Stretcher Discharge  Destination: Home Transportation: Ambulance Accompanied By: wife Schedule Follow-up Appointment: Yes Clinical Summary of Care: Electronic Signature(s) Signed: 08/06/2021 2:08:45 PM By: Donnamarie Poag Entered By: Donnamarie Poag on 08/06/2021 11:45:14 Doree Fudge ([id number]) -------------------------------------------------------------------------------- Lower Extremity Assessment Details Patient Name: Norman Palmer, [name] Date of Service: 08/06/2021 11:00 AM Medical Record Number: [id number] Patient Account Number: [id number] Date of Birth/Sex: 07-08-1941 (79 y.o. M) Treating RN: Donnamarie Poag Primary Care : Lelon Huh Other Clinician: Referring : Lelon Huh Treating /Extender: Skipper Cliche in Treatment: 8 Electronic Signature(s) Signed: 08/06/2021 11:42:01 AM By: Donnamarie Poag Entered By: Donnamarie Poag on 08/06/2021 11:32:04 Doree Fudge ([id number]) -------------------------------------------------------------------------------- Multi Wound Chart Details Patient Name: Norman Palmer, [name] Date of Service: 08/06/2021 11:00 AM Medical Record Number: [id number] Patient Account Number: [id number] Date of Birth/Sex: Mar 18, 1941 (79 y.o. M) Treating RN: Donnamarie Poag Primary Care : Lelon Huh Other Clinician: Referring : Lelon Huh Treating /Extender: Skipper Cliche in Treatment: 8 Vital Signs Height(in): 72 Pulse(bpm): 97 Weight(lbs): 282 Blood Pressure(mmHg): 93/60 Body Mass Index(BMI): 38 Temperature(F): 97.9 Respiratory Rate(breaths/min): 16 Photos: [N/Norman Palmer:N/Norman Palmer] Wound Location: Left Gluteus Right Gluteus N/Norman Palmer Wounding Event: Pressure Injury Gradually Appeared N/Norman Palmer Primary Etiology: Pressure Ulcer Pressure Ulcer N/Norman Palmer Comorbid History: Hypertension, Type II Diabetes, Hypertension, Type II Diabetes, N/Norman Palmer History of pressure wounds, History of pressure wounds, Dementia Dementia Date Acquired: 04/15/2021 07/22/2021  N/Norman Palmer Weeks of Treatment: 8 0 N/Norman Palmer Wound Status: Open Open N/Norman Palmer Measurements L x W x D (cm) 1.5x1x2.5 0.4x0.3x0.1 N/Norman Palmer Area (cm) : 1.178 0.094 N/Norman Palmer Volume (cm) : 2.945 0.009 N/Norman Palmer % Reduction in Area: 79.00% N/Norman Palmer N/Norman Palmer % Reduction in Volume: 91.20% N/Norman Palmer N/Norman Palmer Starting Position 1 (o'clock): 9 Ending Position 1 (o'clock): 12 Maximum Distance 1 (cm): 4 Undermining: Yes No N/Norman Palmer Classification: Category/Stage IV Category/Stage II N/Norman Palmer Exudate Amount: Large Medium N/Norman Palmer Exudate Type: Serosanguineous Serosanguineous N/Norman Palmer Exudate Color: red, brown red, brown N/Norman Palmer Wound Margin: Well defined, not attached Flat and Intact N/Norman Palmer Granulation Amount: Large (  67-100%) Large (67-100%) N/Norman Palmer Granulation Quality: Red, Hyper-granulation Red, Pink N/Norman Palmer Necrotic Amount: None Present (0%) None Present (0%) N/Norman Palmer Exposed Structures: Fat Layer (Subcutaneous Tissue): N/Norman Palmer N/Norman Palmer Yes Muscle: Yes Fascia: No Tendon: No Joint: No Bone: No Epithelialization: None N/Norman Palmer N/Norman Palmer Treatment Notes Electronic Signature(s) Signed: 08/06/2021 11:42:01 AM By: Crista Curb ([id number]) Entered By: Donnamarie Poag on 08/06/2021 11:35:58 Doree Fudge ([id number]) -------------------------------------------------------------------------------- [place] Details Patient Name: Norman Palmer, [name] Date of Service: 08/06/2021 11:00 AM Medical Record Number: [id number] Patient Account Number: [id number] Date of Birth/Sex: January 18, 1941 (79 y.o. M) Treating RN: Donnamarie Poag Primary Care : Lelon Huh Other Clinician: Referring : Lelon Huh Treating /Extender: Skipper Cliche in Treatment: 8 Active Inactive Wound/Skin Impairment Nursing Diagnoses: Impaired tissue integrity Knowledge deficit related to ulceration/compromised skin integrity Goals: Patient/caregiver will verbalize understanding of skin care regimen Date Initiated: 06/10/2021 Date Inactivated: 08/06/2021 Target Resolution  Date: 07/15/2021 Goal Status: Met Ulcer/skin breakdown will have Norman Palmer volume reduction of 30% by week 4 Date Initiated: 06/10/2021 Target Resolution Date: 07/15/2021 Goal Status: Active Ulcer/skin breakdown will have Norman Palmer volume reduction of 50% by week 8 Date Initiated: 06/10/2021 Target Resolution Date: 08/19/2021 Goal Status: Active Ulcer/skin breakdown will have Norman Palmer volume reduction of 80% by week 12 Date Initiated: 06/10/2021 Target Resolution Date: 09/09/2021 Goal Status: Active Interventions: Assess patient/caregiver ability to obtain necessary supplies Assess patient/caregiver ability to perform ulcer/skin care regimen upon admission and as needed Assess ulceration(s) every visit Treatment Activities: Patient referred to home care : 06/10/2021 Notes: Electronic Signature(s) Signed: 08/06/2021 11:42:01 AM By: Donnamarie Poag Entered By: Donnamarie Poag on 08/06/2021 11:32:27 Doree Fudge ([id number]) -------------------------------------------------------------------------------- Pain Assessment Details Patient Name: Norman Palmer, [name] Date of Service: 08/06/2021 11:00 AM Medical Record Number: [id number] Patient Account Number: [id number] Date of Birth/Sex: 06-21-1941 (79 y.o. M) Treating RN: Donnamarie Poag Primary Care : Lelon Huh Other Clinician: Referring : Lelon Huh Treating /Extender: Skipper Cliche in Treatment: 8 Active Problems Location of Pain Severity and Description of Pain Patient Has Paino No Site Locations Rate the pain. Current Pain Level: 0 Pain Management and Medication Current Pain Management: Electronic Signature(s) Signed: 08/06/2021 11:42:01 AM By: Donnamarie Poag Entered By: Donnamarie Poag on 08/06/2021 11:20:23 Doree Fudge ([id number]) -------------------------------------------------------------------------------- Patient/Caregiver Education Details Patient Name: Norman Palmer, [name] Date of Service: 08/06/2021 11:00  AM Medical Record Number: [id number] Patient Account Number: [id number] Date of Birth/Gender: 06-29-1941 (79 y.o. M) Treating RN: Donnamarie Poag Primary Care Physician: Lelon Huh Other Clinician: Referring Physician: Lelon Huh Treating Physician/Extender: Skipper Cliche in Treatment: 8 Education Assessment Education Provided To: Caregiver wife Education Topics Provided Basic Hygiene: Nutrition: Offloading: Wound/Skin Impairment: Electronic Signature(s) Signed: 08/06/2021 11:42:01 AM By: Donnamarie Poag Entered By: Donnamarie Poag on 08/06/2021 11:41:02 Doree Fudge ([id number]) -------------------------------------------------------------------------------- Wound Assessment Details Patient Name: Norman Palmer, [name] Date of Service: 08/06/2021 11:00 AM Medical Record Number: [id number] Patient Account Number: [id number] Date of Birth/Sex: 08/06/1941 (79 y.o. M) Treating RN: Donnamarie Poag Primary Care : Lelon Huh Other Clinician: Referring : Lelon Huh Treating /Extender: Skipper Cliche in Treatment: 8 Wound Status Wound Number: 1 Primary Pressure Ulcer Etiology: Wound Location: Left Gluteus Wound Status: Open Wounding Event: Pressure Injury Comorbid Hypertension, Type II Diabetes, History of pressure Date Acquired: 04/15/2021 History: wounds, Dementia Weeks Of Treatment: 8 Clustered Wound: No Photos Wound Measurements Length: (cm) 1.5 % Redu Width: (cm) 1 % Redu Depth: (cm) 2.5 Epithe Area: (cm) 1.178 Tunne Volume: (cm) 2.945 Po Max ction in Area: 79%  ction in Volume: 91.2% lialization: None ling: Yes sition (o'clock): 11 imum Distance: (cm) 4 Undermining: No Wound Description Classification: Category/Stage IV Foul O Wound Margin: Well defined, not attached Slough Exudate Amount: Large Exudate Type: Serosanguineous Exudate Color: red, brown dor After Cleansing: No /Fibrino Yes Wound Bed Granulation Amount: Large  (67-100%) Exposed Structure Granulation Quality: Red, Hyper-granulation Fascia Exposed: No Necrotic Amount: None Present (0%) Fat Layer (Subcutaneous Tissue) Exposed: Yes Tendon Exposed: No Muscle Exposed: Yes Necrosis of Muscle: No Joint Exposed: No Bone Exposed: No Treatment Notes Wound #1 (Gluteus) Wound Laterality: Left Cleanser AULTON, ROUTT ([id number]) Peri-Wound Care Topical Primary Dressing Gauze Discharge Instruction: Dakins gauze applied in office today Secondary Dressing ABD Pad 5x9 (in/in) Discharge Instruction: Cover with ABD pad Secured With 67M Medipore H Soft Cloth Surgical Tape, 2x2 (in/yd) Discharge Instruction: Secure dressing Compression Wrap Compression Stockings Add-Ons Electronic Signature(s) Signed: 08/06/2021 2:08:45 PM By: Donnamarie Poag Previous Signature: 08/06/2021 11:42:01 AM Version By: Donnamarie Poag Entered By: Donnamarie Poag on 08/06/2021 11:43:00 Doree Fudge ([id number]) -------------------------------------------------------------------------------- Wound Assessment Details Patient Name: Norman Palmer, [name] Date of Service: 08/06/2021 11:00 AM Medical Record Number: [id number] Patient Account Number: [id number] Date of Birth/Sex: 1941-04-19 (79 y.o. M) Treating RN: Donnamarie Poag Primary Care Banita Lehn: Lelon Huh Other Clinician: Referring Lilyannah Zuelke: Lelon Huh Treating Montarius Kitag[place]/Extender: Skipper Cliche in Treatment: 8 Wound Status Wound Number: 2 Primary Pressure Ulcer Etiology: Wound Location: Right Gluteus Wound Status: Open Wounding Event: Gradually Appeared Comorbid Hypertension, Type II Diabetes, History of pressure Date Acquired: 07/22/2021 History: wounds, Dementia Weeks Of Treatment: 0 Clustered Wound: No Photos Wound Measurements Length: (cm) 0.4 Width: (cm) 0.3 Depth: (cm) 0.1 Area: (cm) 0.094 Volume: (cm) 0.009 % Reduction in Area: % Reduction in Volume: Tunneling: No Undermining: No Wound  Description Classification: Category/Stage II Wound Margin: Flat and Intact Exudate Amount: Medium Exudate Type: Serosanguineous Exudate Color: red, brown Foul Odor After Cleansing: No Slough/Fibrino No Wound Bed Granulation Amount: Large (67-100%) Granulation Quality: Red, Pink Necrotic Amount: None Present (0%) Treatment Notes Wound #2 (Gluteus) Wound Laterality: Right Cleanser Soap and Water Discharge Instruction: Gently cleanse wound with antibacterial soap, rinse and pat dry prior to dressing wounds Peri-Wound Care 67M Cavilon Advanced Skin Protectant, 0.27 (ml) Discharge Instruction: may use for skin protection in peri area Topical FERDIE, BAKKEN ([id number]) Primary Dressing AquacelAg Advantage Dressing, 2X2 (in/in) Discharge Instruction: Apply to wound as directed Secondary Dressing Mepilex Border Flex, 4x4 (in/in) Discharge Instruction: Apply to wound as directed. Do not cut. Secured With Compression Wrap Compression Stockings Add-Ons Electronic Signature(s) Signed: 08/06/2021 11:42:01 AM By: Donnamarie Poag Entered By: Donnamarie Poag on 08/06/2021 11:31:48 Doree Fudge ([id number]) -------------------------------------------------------------------------------- Vitals Details Patient Name: KEEVAN, WOLZ. Date of Service: 08/06/2021 11:00 AM Medical Record Number: [id number] Patient Account Number: [id number] Date of Birth/Sex: 1941-10-11 (79 y.o. M) Treating RN: Donnamarie Poag Primary Care Cadee Agro: Lelon Huh Other Clinician: Referring Linday Rhodes: Lelon Huh Treating Ozie Dimaria/Extender: Skipper Cliche in Treatment: 8 Vital Signs Time Taken: 11:15 Temperature (F): 97.9 Height (in): 72 Pulse (bpm): 97 Weight (lbs): 282 Respiratory Rate (breaths/min): 16 Body Mass Index (BMI): 38.2 Blood Pressure (mmHg): 93/60 Reference Range: 80 - 120 mg / dl Electronic Signature(s) Signed: 08/06/2021 11:42:01 AM By: Donnamarie Poag Entered ByDonnamarie Poag on  08/06/2021 11:20:03

## 2021-08-08 ENCOUNTER — Telehealth: Payer: Self-pay

## 2021-08-08 NOTE — Telephone Encounter (Signed)
Yes - thank you

## 2021-08-08 NOTE — Telephone Encounter (Signed)
Copied from [place] [phone] [phone]. Topic: Quick Communication - Home Health Verbal Orders >> Aug 08, 2021 11:46 AM Tessa Lerner A wrote: Caller/Agency: [facility]   Callback Number: [phone]  Requesting OT/PT/Skilled Nursing/Social Work/Speech Therapy: PT  Frequency: 2w3 1w2 and Approval to extend

## 2021-08-08 NOTE — Telephone Encounter (Signed)
Pt advised.   Thanks,   -Norman Palmer  

## 2021-08-08 NOTE — Telephone Encounter (Signed)
Is this ok?

## 2021-08-12 ENCOUNTER — Telehealth: Payer: Self-pay | Admitting: Family Medicine

## 2021-08-12 DIAGNOSIS — N390 Urinary tract infection, site not specified: Secondary | ICD-10-CM

## 2021-08-12 NOTE — Telephone Encounter (Signed)
Proceed with Urinalysis and get urine C&S if bacteria, WBC's or RBC's present.

## 2021-08-12 NOTE — Telephone Encounter (Signed)
Mark PTA with [facility] is calling and would like verbal order to collect urine sample the patient is experiencing UTI symptoms. Please call lauren at [phone]

## 2021-08-12 NOTE — Telephone Encounter (Signed)
Norman Palmer was given the verbals.   Thanks,   -Mickel Baas

## 2021-08-12 NOTE — Telephone Encounter (Signed)
Please review for Dr. Caryn Section thanks.

## 2021-08-13 NOTE — Telephone Encounter (Signed)
Called home health to give verbal order for quick dip.

## 2021-08-14 ENCOUNTER — Telehealth: Payer: Self-pay

## 2021-08-14 DIAGNOSIS — Z319 Encounter for procreative management, unspecified: Secondary | ICD-10-CM | POA: Diagnosis not present

## 2021-08-14 MED ORDER — CIPROFLOXACIN HCL 500 MG PO TABS: 500.0000 mg | ORAL_TABLET | Freq: Two times a day (BID) | ORAL | 0 refills | Status: DC

## 2021-08-14 NOTE — Addendum Note (Signed)
Addended by: Lelon Huh E on: 08/14/2021 10:10 AM   Modules accepted: Orders

## 2021-08-14 NOTE — Telephone Encounter (Signed)
Copied from [place] [phone]. Topic: General - Other >> Aug 14, 2021 12:00 PM Pawlus, Brayton Layman A wrote: Reason for CRM: Sonja from [facility] stated she is unable to get a urine sample on the pt, caller stated she cannot get past his prostate and wanted to know what Dr Caryn Section wanted her to do next. Please advise.

## 2021-08-14 NOTE — Telephone Encounter (Signed)
Have sent prescription for ciprofloxacin to [facility].

## 2021-08-14 NOTE — Telephone Encounter (Signed)
Norman Palmer from [facility] called to say that they are scheduled to see patient today to collect urine for UA with C&S.  Wife has been calling agency requesting a "quick dip' so they can get results faster.  I believe this is meant to be a multistick dipstick.  Home Health agency said they are not able to do that. They do not do dipsticks.   Wife is wanting patient to be started on something while waiting for results. Wife would like call back. Thanks

## 2021-08-14 NOTE — Telephone Encounter (Signed)
Left message advising [name]

## 2021-08-14 NOTE — Telephone Encounter (Signed)
I called Sonja and advised her as below. Becky Sax states that [facility] usually delivers prescriptions to the patient's home, but she is going to call patient's wife to let her know also.

## 2021-08-14 NOTE — Telephone Encounter (Signed)
Have sent prescription for ciprofloxacin to [facility].

## 2021-08-15 DIAGNOSIS — L89323 Pressure ulcer of left buttock, stage 3: Secondary | ICD-10-CM | POA: Diagnosis not present

## 2021-08-15 DIAGNOSIS — F0391 Unspecified dementia with behavioral disturbance: Secondary | ICD-10-CM | POA: Diagnosis not present

## 2021-08-15 DIAGNOSIS — I10 Essential (primary) hypertension: Secondary | ICD-10-CM | POA: Diagnosis not present

## 2021-08-15 DIAGNOSIS — L89626 Pressure-induced deep tissue damage of left heel: Secondary | ICD-10-CM | POA: Diagnosis not present

## 2021-08-15 DIAGNOSIS — Z794 Long term (current) use of insulin: Secondary | ICD-10-CM | POA: Diagnosis not present

## 2021-08-15 DIAGNOSIS — R262 Difficulty in walking, not elsewhere classified: Secondary | ICD-10-CM | POA: Diagnosis not present

## 2021-08-15 DIAGNOSIS — E119 Type 2 diabetes mellitus without complications: Secondary | ICD-10-CM | POA: Diagnosis not present

## 2021-08-15 DIAGNOSIS — N401 Enlarged prostate with lower urinary tract symptoms: Secondary | ICD-10-CM | POA: Diagnosis not present

## 2021-08-15 DIAGNOSIS — Z7984 Long term (current) use of oral hypoglycemic drugs: Secondary | ICD-10-CM | POA: Diagnosis not present

## 2021-08-15 DIAGNOSIS — R339 Retention of urine, unspecified: Secondary | ICD-10-CM | POA: Diagnosis not present

## 2021-08-19 DIAGNOSIS — F0281 Dementia in other diseases classified elsewhere with behavioral disturbance: Secondary | ICD-10-CM | POA: Diagnosis not present

## 2021-08-19 DIAGNOSIS — G301 Alzheimer's disease with late onset: Secondary | ICD-10-CM | POA: Diagnosis not present

## 2021-08-21 ENCOUNTER — Telehealth: Payer: Self-pay | Admitting: Family Medicine

## 2021-08-21 NOTE — Telephone Encounter (Signed)
U/a confirms UTI. Should clear with antibiotic that was prescribed. Call if not much better when finished.

## 2021-08-21 NOTE — Telephone Encounter (Signed)
Patient's wife Santiago Glad advised and verbalized understanding (ok per DPR).

## 2021-08-28 ENCOUNTER — Other Ambulatory Visit: Payer: Self-pay | Admitting: Family Medicine

## 2021-08-28 DIAGNOSIS — F03911 Unspecified dementia, unspecified severity, with agitation: Secondary | ICD-10-CM

## 2021-08-28 DIAGNOSIS — F0391 Unspecified dementia with behavioral disturbance: Secondary | ICD-10-CM

## 2021-08-28 NOTE — Telephone Encounter (Signed)
Last refill:  Vitamin d: 07/18/2020 #12, with 4 refills Risperidone: 05/24/2021 # 30, with 1 refill  Last office visit: virtual 07/12/2021 Next office visit: none scheduled

## 2021-09-01 ENCOUNTER — Other Ambulatory Visit: Payer: Self-pay | Admitting: Family Medicine

## 2021-09-01 DIAGNOSIS — G9009 Other idiopathic peripheral autonomic neuropathy: Secondary | ICD-10-CM

## 2021-09-01 NOTE — Telephone Encounter (Signed)
Requested Prescriptions  Pending Prescriptions Disp Refills  . gabapentin (NEURONTIN) 300 MG capsule [Pharmacy Med Name: GABAPENTIN 300 MG CAP] 30 capsule 1    Sig: TAKE 1 CAPSULE BY MOUTH AT BEDTIME     Neurology: Anticonvulsants - gabapentin Passed - 09/01/2021 10:41 AM      Passed - Valid encounter within last 12 months    Recent Outpatient Visits          1 month ago Dementia with behavioral disturbance, unspecified dementia type [facility])   [facility] Birdie Sons, MD   3 months ago Essential hypertension   [facility] Birdie Sons, MD   8 months ago Contusion of right front wall of thorax, initial encounter   [facility] Birdie Sons, MD   1 year ago Type 2 diabetes mellitus with diabetic neuropathy, with long-term current use of insulin [facility])   [facility] Birdie Sons, MD   1 year ago [place], Donald E, MD             . lisinopril (ZESTRIL) 20 MG tablet [Pharmacy Med Name: LISINOPRIL 20 MG TAB] 90 tablet 0    Sig: TAKE 1 TABLET BY MOUTH DAILY     Cardiovascular:  ACE Inhibitors Failed - 09/01/2021 10:41 AM      Failed - Cr in normal range and within 180 days    Creat  Date Value Ref Range Status  10/02/2017 1.15 0.70 - 1.18 mg/dL Final    Comment:    For patients >26 years of age, the reference limit for Creatinine is approximately 13% higher for people identified as African-American. .    Creatinine, Ser  Date Value Ref Range Status  05/07/2021 1.36 (H) 0.76 - 1.27 mg/dL Final         Failed - Last BP in normal range    BP Readings from Last 1 Encounters:  06/17/21 (!) 102/48         Passed - K in normal range and within 180 days    Potassium  Date Value Ref Range Status  05/07/2021 5.1 3.5 - 5.2 mmol/L Final  05/02/2015 4.3 mmol/L Final         Passed - Patient is not pregnant      Passed - Valid encounter within last 6 months    Recent  Outpatient Visits          1 month ago Dementia with behavioral disturbance, unspecified dementia type [facility])   [facility] Birdie Sons, MD   3 months ago Essential hypertension   [facility] Birdie Sons, MD   8 months ago Contusion of right front wall of thorax, initial encounter   [facility] Birdie Sons, MD   1 year ago Type 2 diabetes mellitus with diabetic neuropathy, with long-term current use of insulin [facility])   [facility] Birdie Sons, MD   1 year ago [place], Kirstie Peri, MD

## 2021-09-03 ENCOUNTER — Other Ambulatory Visit: Payer: Self-pay

## 2021-09-03 ENCOUNTER — Encounter: Payer: Medicare Other | Attending: Physician Assistant | Admitting: Physician Assistant

## 2021-09-03 DIAGNOSIS — E1122 Type 2 diabetes mellitus with diabetic chronic kidney disease: Secondary | ICD-10-CM | POA: Diagnosis not present

## 2021-09-03 DIAGNOSIS — E11622 Type 2 diabetes mellitus with other skin ulcer: Secondary | ICD-10-CM | POA: Diagnosis not present

## 2021-09-03 DIAGNOSIS — L89312 Pressure ulcer of right buttock, stage 2: Secondary | ICD-10-CM | POA: Diagnosis not present

## 2021-09-03 DIAGNOSIS — R0902 Hypoxemia: Secondary | ICD-10-CM | POA: Diagnosis not present

## 2021-09-03 DIAGNOSIS — E1151 Type 2 diabetes mellitus with diabetic peripheral angiopathy without gangrene: Secondary | ICD-10-CM | POA: Insufficient documentation

## 2021-09-03 DIAGNOSIS — I129 Hypertensive chronic kidney disease with stage 1 through stage 4 chronic kidney disease, or unspecified chronic kidney disease: Secondary | ICD-10-CM | POA: Insufficient documentation

## 2021-09-03 DIAGNOSIS — N189 Chronic kidney disease, unspecified: Secondary | ICD-10-CM | POA: Insufficient documentation

## 2021-09-03 DIAGNOSIS — W19XXXA Unspecified fall, initial encounter: Secondary | ICD-10-CM | POA: Diagnosis not present

## 2021-09-03 DIAGNOSIS — L89324 Pressure ulcer of left buttock, stage 4: Secondary | ICD-10-CM | POA: Insufficient documentation

## 2021-09-03 DIAGNOSIS — I959 Hypotension, unspecified: Secondary | ICD-10-CM | POA: Diagnosis not present

## 2021-09-03 DIAGNOSIS — R404 Transient alteration of awareness: Secondary | ICD-10-CM | POA: Diagnosis not present

## 2021-09-03 DIAGNOSIS — R410 Disorientation, unspecified: Secondary | ICD-10-CM | POA: Diagnosis not present

## 2021-09-03 DIAGNOSIS — Z7401 Bed confinement status: Secondary | ICD-10-CM | POA: Diagnosis not present

## 2021-09-03 NOTE — Progress Notes (Addendum)
ZO, DINOVO ([id number]) Visit Report for 09/03/2021 Chief Complaint Document Details Patient Name: Norman Palmer, [name] Date of Service: 09/03/2021 2:45 PM Medical Record Number: [id number] Patient Account Number: [id number] Date of Birth/Sex: 04-14-1941 (80 y.o. M) Treating RN: Dolan Amen Primary Care Provider: Lelon Huh Other Clinician: Referring Provider: Lelon Huh Treating Provider/Extender: Skipper Cliche in Treatment: 12 Information Obtained from: Patient Chief Complaint Pressure ulcer left gluteus and right gluteus Electronic Signature(s) Signed: 09/03/2021 3:27:10 PM By: Worthy Keeler PA-C Entered By: Worthy Keeler on 09/03/2021 15:27:10 Norman Palmer ([id number]) -------------------------------------------------------------------------------- HPI Details Patient Name: Norman Palmer, [name] Date of Service: 09/03/2021 2:45 PM Medical Record Number: [id number] Patient Account Number: [id number] Date of Birth/Sex: [birth year] (80 y.o. 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 This occurred when he was actually in a skilled nursing facility his wife tells me at [facility]. She was very upset about this. She recently took him home he unfortunately does have Alzheimer's which seems to be advancing. She notes that currently he seems to be getting worse in regard to his anger and outburst. He consistently during the time that was he was in the office today was calling his wife by explosives but I will put in the chart but nonetheless I do not think this is him she states this is nothing like how he was before the Alzheimer's caused some  any changes in him. She also notes however that he also seems to be worse than normal she is concerned that he may have an issue here with a urinary tract infection is recently had 1 and she feels like that might be returning. The patient is generally weak. He also has a history of diabetes mellitus type 2, Alzheimer's disease and chronic kidney disease although she does not know how severe. 06/18/2021 upon evaluation today patient appears to be doing well with regard to his wound. In fact the wound VAC seems to be doing an excellent job for him. There is no signs of active infection at this time which is great news. No fevers, chills, nausea, vomiting, or diarrhea. 07/09/2021 upon evaluation today patient's wound actually is showing signs of excellent improvement which is great news. I do not see any evidence of active infection which is also great news and I think the wound VAC is actually doing a great job he seems to be in pretty good spirits today all things considered. This is somewhat surprising as this probably is the best day he has had since have been helping to take care of him from the standpoint of his demeanor. 08/06/2021 upon evaluation today patient appears to be doing pretty well in regard to his wounds. The wound VAC seems to be doing excellent in regard to the left gluteal region. The right gluteal area which is somewhat open today does not appear to be bad at all and overall I think that the patient is doing quite well in that regard. Fortunately there is no signs of active infection at this time. No fevers, chills, nausea, vomiting, or diarrhea. 09/03/2021 upon evaluation today patient's wound is actually showing signs of excellent improvement which is great news and overall I am extremely pleased with where we stand today. There does not  appear to be any evidence of active infection which is great news and in general I think that we are definitely headed in the appropriate  direction. The patient's wife is likewise extremely pleased with what we are seeing currently. In fact I think that we will get a probably need to discontinue the wound VAC as its not really appropriate at trying to continue to apply this with such a small opening. Electronic Signature(s) Signed: 09/03/2021 5:25:27 PM By: Worthy Keeler PA-C Entered By: Worthy Keeler on 09/03/2021 17:25:26 Norman Palmer ([id number]) -------------------------------------------------------------------------------- Physical Exam Details Patient Name: Norman Palmer, [name] Date of Service: 09/03/2021 2:45 PM Medical Record Number: [id number] Patient Account Number: [id number] Date of Birth/Sex: [birth year] (80 y.o. M) Treating RN: Dolan Amen Primary Care Provider: Lelon Huh Other Clinician: Referring Provider: Lelon Huh Treating Provider/Extender: Skipper Cliche in Treatment: 65 Constitutional Well-nourished and well-hydrated in no acute distress. Respiratory normal breathing without difficulty. Psychiatric this patient is able to make decisions and demonstrates good insight into disease process. Alert and Oriented x 3. pleasant and cooperative. Notes Upon inspection patient's wound bed actually showed signs of good granulation epithelization the depth is dramatically improved and at this point I do not believe that we will need to continue with the wound VAC any longer in fact I think it is really prohibitive due to the size of the wound. Electronic Signature(s) Signed: 09/03/2021 5:25:43 PM By: Worthy Keeler PA-C Entered By: Worthy Keeler on 09/03/2021 17:25:42 Norman Palmer ([id number]) -------------------------------------------------------------------------------- Physician Orders Details Patient Name: Norman Palmer, [name] Date of Service: 09/03/2021 2:45 PM Medical Record Number: [id number] Patient Account Number: [id number] Date of Birth/Sex: 02/11/1941 (80 y.o. M) Treating  RN: Dolan Amen Primary Care Provider: Lelon Huh Other Clinician: Referring Provider: Lelon Huh Treating Provider/Extender: Skipper Cliche in Treatment: 12 Verbal / Phone Orders: No Diagnosis Coding ICD-10 Coding Code Description [phone] Pressure ulcer of left buttock, stage 4 L89.312 Pressure ulcer of right buttock, stage 2 E11.622 Type 2 diabetes mellitus with other skin ulcer G30.8 Other Alzheimer's disease N18.9 Chronic kidney disease, unspecified M62.81 Muscle weakness (generalized) Follow-up Appointments o Return Appointment in 3 weeks. Home Health Wound #1 Left [place] for wound care. May utilize formulary equivalent dressing for wound treatment orders unless otherwise specified. Home Health Nurse may visit PRN to address patientos wound care needs. o Scheduled days for dressing changes to be completed; exception, patient has scheduled wound care visit that day. Bathing/ Shower/ Hygiene o May shower; gently cleanse wound with antibacterial soap, rinse and pat dry prior to dressing wounds o No tub bath. Off-Loading o Gel wheelchair cushion - continue using o Hospital bed/mattress - continue using o Low air-loss mattress (Group 2) - continue using o Turn and reposition every 2 hours Negative Pressure Wound Therapy Wound #1 Left Gluteus o Discontinue NPWT. Wound Treatment Wound #1 - Gluteus Wound Laterality: Left Cleanser: Wound Cleanser 1 x Per Day/30 Days Discharge Instructions: Wash your hands with soap and water. Remove old dressing, discard into plastic bag and place into trash. Cleanse the wound with Wound Cleanser prior to applying a clean dressing using gauze sponges, not tissues or cotton balls. Do not scrub or use excessive force. Pat dry using gauze sponges, not tissue or cotton balls. Primary Dressing: Gauze 1 x Per Day/30 Days Discharge Instructions: Moisten gauze  with dakins solution 0.25%, pack in wound Secondary Dressing: Zetuvit Plus Silicone Border Dressing  5x5 (in/in) 1 x Per Day/30 Days Discharge Instructions: Apply border foam dressing Electronic Signature(s) Signed: 09/03/2021 5:00:35 PM By: Dolan Amen RN Signed: 09/03/2021 9:56:09 PM By: Worthy Keeler PA-C Entered By: Dolan Amen on 09/03/2021 16:47:19 Norman Palmer, Norman Palmer ([id number]) Norman Palmer, Norman Palmer ([id number]) -------------------------------------------------------------------------------- Problem List Details Patient Name: JAYSHAWN, BUREK. Date of Service: 09/03/2021 2:45 PM Medical Record Number: [id number] Patient Account Number: [id number] Date of Birth/Sex: May 27, 1941 (79 y.o. M) Treating RN: Dolan Amen Primary Care Provider: Lelon Huh Other Clinician: Referring Provider: Lelon Huh Treating Provider/Extender: Skipper Cliche in Treatment: 12 Active Problems ICD-10 Encounter Code Description Active Date MDM Diagnosis [phone] Pressure ulcer of left buttock, stage 4 06/10/2021 No Yes L89.312 Pressure ulcer of right buttock, stage 2 08/06/2021 No Yes E11.622 Type 2 diabetes mellitus with other skin ulcer 06/10/2021 No Yes G30.8 Other Alzheimer's disease 06/10/2021 No Yes N18.9 Chronic kidney disease, unspecified 06/10/2021 No Yes M62.81 Muscle weakness (generalized) 06/10/2021 No Yes Inactive Problems Resolved Problems Electronic Signature(s) Signed: 09/03/2021 3:27:05 PM By: Worthy Keeler PA-C Entered By: Worthy Keeler on 09/03/2021 15:27:04 Norman Palmer ([id number]) -------------------------------------------------------------------------------- Progress Note Details Patient Name: [name] Date of Service: 09/03/2021 2:45 PM Medical Record Number: [id number] Patient Account Number: [id number] Date of Birth/Sex: August 24, 1941 (79 y.o. 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 This occurred when he was actually in a skilled nursing facility his wife tells me at [facility]. She was very upset about this. She recently took him home he unfortunately does have Alzheimer's which seems to be advancing. She notes that currently he seems to be getting worse in regard to his anger and outburst. He consistently during the time that was he was in the office today was calling his wife by explosives but I will put in the chart but nonetheless I do not think this is him she states this is nothing like how he was before the Alzheimer's caused some any changes in him. She also notes however that he also seems to be worse than normal she is concerned that he may have an issue here with a urinary tract infection is recently had 1 and she feels like that might be returning. The patient is generally weak. He also has a history of diabetes mellitus type 2, Alzheimer's disease and chronic kidney disease although she does not know how severe. 06/18/2021 upon evaluation today patient appears to be doing well with regard to his wound. In fact the wound VAC seems to be doing an excellent job for him. There is no signs of active infection at this time which is great news. No fevers, chills, nausea, vomiting, or diarrhea. 07/09/2021 upon evaluation today patient's wound actually is showing signs of excellent improvement which is great news. I do not see any evidence of active infection which is also great news and I think the wound VAC is actually doing a great job he seems to be in pretty good spirits today all things considered. This is  somewhat surprising as this probably is the best day he has had since have been helping to take care of him from the standpoint of  his demeanor. 08/06/2021 upon evaluation today patient appears to be doing pretty well in regard to his wounds. The wound VAC seems to be doing excellent in regard to the left gluteal region. The right gluteal area which is somewhat open today does not appear to be bad at all and overall I think that the patient is doing quite well in that regard. Fortunately there is no signs of active infection at this time. No fevers, chills, nausea, vomiting, or diarrhea. 09/03/2021 upon evaluation today patient's wound is actually showing signs of excellent improvement which is great news and overall I am extremely pleased with where we stand today. There does not appear to be any evidence of active infection which is great news and in general I think that we are definitely headed in the appropriate direction. The patient's wife is likewise extremely pleased with what we are seeing currently. In fact I think that we will get a probably need to discontinue the wound VAC as its not really appropriate at trying to continue to apply this with such a small opening. Objective Constitutional Well-nourished and well-hydrated in no acute distress. Vitals Time Taken: 2:55 PM, Height: 72 in, Weight: 282 lbs, BMI: 38.2, Pulse: 86 bpm, Respiratory Rate: 20 breaths/min, Blood Pressure: 130/68 mmHg, Pulse Oximetry: 95 %. General Notes: vitals obtained by EMS transport Respiratory normal breathing without difficulty. Psychiatric this patient is able to make decisions and demonstrates good insight into disease process. Alert and Oriented x 3. pleasant and cooperative. General Notes: Upon inspection patient's wound bed actually showed signs of good granulation epithelization the depth is dramatically improved and at this point I do not believe that we will need to continue with the wound VAC any  longer in fact I think it is really prohibitive due to the size of the wound. Norman Palmer, Norman Palmer [place]. ([id number]) Integumentary (Hair, Skin) Wound #1 status is Open. Original cause of wound was Pressure Injury. The date acquired was: 04/15/2021. The wound has been in treatment 12 weeks. The wound is located on the Left Gluteus. The wound measures 0.7cm length x 0.3cm width x 2cm depth; 0.165cm^2 area and 0.33cm^3 volume. There is Fat Layer (Subcutaneous Tissue) exposed. There is no tunneling or undermining noted. There is a medium amount of serosanguineous drainage noted. The wound margin is well defined and not attached to the wound base. There is no granulation within the wound bed. There is no necrotic tissue within the wound bed. Wound #2 status is Open. Original cause of wound was Gradually Appeared. The date acquired was: 07/22/2021. The wound has been in treatment 4 weeks. The wound is located on the Right Gluteus. The wound measures 0cm length x 0cm width x 0cm depth; 0cm^2 area and 0cm^3 volume. There is no tunneling or undermining noted. There is a none present amount of drainage noted. The wound margin is flat and intact. There is no granulation within the wound bed. There is no necrotic tissue within the wound bed. Assessment Active Problems ICD-10 Pressure ulcer of left buttock, stage 4 Pressure ulcer of right buttock, stage 2 Type 2 diabetes mellitus with other skin ulcer Other Alzheimer's disease Chronic kidney disease, unspecified Muscle weakness (generalized) Plan Follow-up Appointments: Return Appointment in 3 weeks. Home Health: Wound #1 Left Gluteus: [place] for wound care. May utilize formulary equivalent dressing for wound treatment orders unless otherwise specified. Home Health Nurse may visit PRN to address patient s wound care needs. Scheduled days for  dressing changes to be completed; exception, patient has scheduled wound  care visit that day. Bathing/ Shower/ Hygiene: May shower; gently cleanse wound with antibacterial soap, rinse and pat dry prior to dressing wounds No tub bath. Off-Loading: Gel wheelchair cushion - continue using Hospital bed/mattress - continue using Low air-loss mattress (Group 2) - continue using Turn and reposition every 2 hours Negative Pressure Wound Therapy: Wound #1 Left Gluteus: Discontinue NPWT. WOUND #1: - Gluteus Wound Laterality: Left Cleanser: Wound Cleanser 1 x Per Day/30 Days Discharge Instructions: Wash your hands with soap and water. Remove old dressing, discard into plastic bag and place into trash. Cleanse the wound with Wound Cleanser prior to applying a clean dressing using gauze sponges, not tissues or cotton balls. Do not scrub or use excessive force. Pat dry using gauze sponges, not tissue or cotton balls. Primary Dressing: Gauze 1 x Per Day/30 Days Discharge Instructions: Moisten gauze with dakins solution 0.25%, pack in wound Secondary Dressing: Zetuvit Plus Silicone Border Dressing 5x5 (in/in) 1 x Per Day/30 Days Discharge Instructions: Apply border foam dressing 1. Would recommend that we going continue with the packing I would recommend the [place]'s moistened gauze packing at this point which I think will be appropriate we will send orders to home health. 2. I am also can recommend that we have this changed daily I think that is good to be a much better way to manage this with the wound VAC currently. 3. I am also can recommend that we continue with the covering of this with the Zetuvit border foam dressing to provide protection and catching that except drainage as well. We will see patient back for reevaluation in 3 weeks here in the clinic. If anything worsens or changes patient will contact our office for additional recommendations. Norman Palmer, Norman Palmer ([id number]) Electronic Signature(s) Signed: 09/03/2021 5:26:17 PM By: Worthy Keeler PA-C Entered By:  Worthy Keeler on 09/03/2021 17:26:17 Norman Palmer, Norman Palmer ([id number]) -------------------------------------------------------------------------------- SuperBill Details Patient Name: Norman Palmer, [name] Date of Service: 09/03/2021 Medical Record Number: [id number] Patient Account Number: [id number] Date of Birth/Sex: 09/17/1941 (79 y.o. M) Treating RN: Dolan Amen Primary Care Provider: Lelon Huh Other Clinician: Referring Provider: Lelon Huh Treating Provider/Extender: Skipper Cliche in Treatment: 12 Diagnosis Coding ICD-10 Codes Code Description [phone] Pressure ulcer of left buttock, stage 4 L89.312 Pressure ulcer of right buttock, stage 2 E11.622 Type 2 diabetes mellitus with other skin ulcer G30.8 Other Alzheimer's disease N18.9 Chronic kidney disease, unspecified M62.81 Muscle weakness (generalized) Facility Procedures CPT4 Code: [id number] Description: 99213 - WOUND CARE VISIT-LEV 3 EST PT Modifier: Quantity: 1 Physician Procedures CPT4 Code: [id number] Description: [id number] - WC PHYS LEVEL 4 - EST PT Modifier: Quantity: 1 CPT4 Code: Description: ICD-10 Diagnosis Description L89.324 Pressure ulcer of left buttock, stage 4 L89.312 Pressure ulcer of right buttock, stage 2 E11.622 Type 2 diabetes mellitus with other skin ulcer G30.8 Other Alzheimer's disease Modifier: Quantity: Electronic Signature(s) Signed: 09/03/2021 5:30:10 PM By: Worthy Keeler PA-C Previous Signature: 09/03/2021 4:55:58 PM Version By: Dolan Amen RN Entered By: Worthy Keeler on 09/03/2021 17:30:10

## 2021-09-03 NOTE — Progress Notes (Signed)
Norman, Palmer ([id number]) Visit Report for 09/03/2021 Arrival Information Details [name], [name] Date of Service: 09/03/2021 2:45 PM Medical Record Number: [id number] Patient Account Number: [id number] Date of Birth/Sex: 07-30-1941 (79 y.o. M) Treating RN: Dolan Amen Primary Care Shazia Mitchener: Lelon Huh Other Clinician: Referring Su Duma: Lelon Huh Treating Cerise Lieber/Extender: Skipper Cliche in Treatment: 12 Visit Information History Since Last Visit Pain Present Now: No Patient Arrived: Stretcher Arrival Time: 14:54 Accompanied By: wife Transfer Assistance: Stretcher Patient Identification Verified: Yes Secondary Verification Process Completed: Yes Patient Requires Transmission-Based Precautions: No Patient Has Alerts: Yes Patient Alerts: DIABETIC Electronic Signature(s) Signed: 09/03/2021 5:00:35 PM By: Dolan Amen RN Entered By: Dolan Amen on 09/03/2021 14:54:58 Norman Palmer ([id number]) -------------------------------------------------------------------------------- Clinic Level of Care Assessment Details Patient Palmer: Norman, Palmer. Date of Service: 09/03/2021 2:45 PM Medical Record Number: [id number] Patient Account Number: [id number] Date of Birth/Sex: 01/30/1941 (79 y.o. M) Treating RN: Dolan Amen Primary Care Imagene Boss: Lelon Huh Other Clinician: Referring Tyquon Near: Lelon Huh Treating Sojourner Behringer/Extender: Skipper Cliche in Treatment: 12 Clinic Level of Care Assessment Items TOOL 4 Quantity Score X - Use when only an EandM is performed on FOLLOW-UP visit 1 0 ASSESSMENTS - Nursing Assessment / Reassessment X - Reassessment of Co-morbidities (includes updates in patient status) 1 10 X- 1 5 Reassessment of Adherence to Treatment Plan ASSESSMENTS - Wound and Skin Assessment / Reassessment X - Simple Wound Assessment / Reassessment - one wound 1 5 '[]'  - 0 Complex Wound Assessment / Reassessment - multiple  wounds '[]'  - 0 Dermatologic / Skin Assessment (not related to wound area) ASSESSMENTS - Focused Assessment '[]'  - Circumferential Edema Measurements - multi extremities 0 '[]'  - 0 Nutritional Assessment / Counseling / Intervention '[]'  - 0 Lower Extremity Assessment (monofilament, tuning fork, pulses) '[]'  - 0 Peripheral Arterial Disease Assessment (using hand held doppler) ASSESSMENTS - Ostomy and/or Continence Assessment and Care '[]'  - Incontinence Assessment and Management 0 '[]'  - 0 Ostomy Care Assessment and Management (repouching, etc.) PROCESS - Coordination of Care X - Simple Patient / Family Education for ongoing care 1 15 '[]'  - 0 Complex (extensive) Patient / Family Education for ongoing care '[]'  - 0 Staff obtains Programmer, systems, Records, Test Results / Process Orders '[]'  - 0 Staff telephones HHA, Nursing Homes / Clarify orders / etc '[]'  - 0 Routine Transfer to another Facility (non-emergent condition) '[]'  - 0 Routine Hospital Admission (non-emergent condition) '[]'  - 0 New Admissions / Biomedical engineer / Ordering NPWT, Apligraf, etc. '[]'  - 0 Emergency Hospital Admission (emergent condition) X- 1 10 Simple Discharge Coordination '[]'  - 0 Complex (extensive) Discharge Coordination PROCESS - Special Needs '[]'  - Pediatric / Minor Patient Management 0 '[]'  - 0 Isolation Patient Management '[]'  - 0 Hearing / Language / Visual special needs '[]'  - 0 Assessment of Community assistance (transportation, D/C planning, etc.) '[]'  - 0 Additional assistance / Altered mentation '[]'  - 0 Support Surface(s) Assessment (bed, cushion, seat, etc.) INTERVENTIONS - Wound Cleansing / Measurement Norman Palmer, [name] ([id number]) X- 1 5 Simple Wound Cleansing - one wound '[]'  - 0 Complex Wound Cleansing - multiple wounds X- 1 5 Wound Imaging (photographs - any number of wounds) '[]'  - 0 Wound Tracing (instead of photographs) X- 1 5 Simple Wound Measurement - one wound '[]'  - 0 Complex Wound Measurement -  multiple wounds INTERVENTIONS - Wound Dressings '[]'  - Small Wound Dressing one or multiple wounds 0 X- 1 15 Medium Wound Dressing one or multiple wounds '[]'  -  0 Large Wound Dressing one or multiple wounds '[]'  - 0 Application of Medications - topical '[]'  - 0 Application of Medications - injection INTERVENTIONS - Miscellaneous '[]'  - External ear exam 0 '[]'  - 0 Specimen Collection (cultures, biopsies, blood, body fluids, etc.) '[]'  - 0 Specimen(s) / Culture(s) sent or taken to Lab for analysis X- 1 10 Patient Transfer (multiple staff / Civil Service fast streamer / Similar devices) '[]'  - 0 Simple Staple / Suture removal (25 or less) '[]'  - 0 Complex Staple / Suture removal (26 or more) '[]'  - 0 Hypo / Hyperglycemic Management (close monitor of Blood Glucose) '[]'  - 0 Ankle / Brachial Index (ABI) - do not Norman Palmer if billed separately X- 1 5 Vital Signs Has the patient been seen at the hospital within the last three years: Yes Total Score: 90 Level Of Care: New/Established - Level 3 Electronic Signature(s) Signed: 09/03/2021 5:00:35 PM By: Dolan Amen RN Entered By: Dolan Amen on 09/03/2021 16:55:54 Norman Palmer ([id number]) -------------------------------------------------------------------------------- Encounter Discharge Information Details [name], [name] Date of Service: 09/03/2021 2:45 PM Medical Record Number: [id number] Patient Account Number: [id number] Date of Birth/Sex: [birth year] (79 y.o. M) Treating RN: Dolan Amen Primary Care Joley Utecht: Lelon Huh Other Clinician: Referring Adalynn Corne: Lelon Huh Treating Martavion Couper/Extender: Skipper Cliche in Treatment: 12 Encounter Discharge Information Items Discharge Condition: Stable Ambulatory Status: Stretcher Discharge Destination: Home Transportation: Private Auto Accompanied By: wife Schedule Follow-up Appointment: Yes Clinical Summary of Care: Electronic Signature(s) Signed: 09/03/2021 4:55:10 PM By:  Dolan Amen RN Entered By: Dolan Amen on 09/03/2021 16:55:09 Norman Palmer ([id number]) -------------------------------------------------------------------------------- Lower Extremity Assessment Details [name], [name] Date of Service: 09/03/2021 2:45 PM Medical Record Number: [id number] Patient Account Number: [id number] Date of Birth/Sex: 06-14-1941 (79 y.o. M) Treating RN: Dolan Amen Primary Care Koal Eslinger: Lelon Huh Other Clinician: Referring Jemario Poitras: Lelon Huh Treating Ruthia Person/Extender: Skipper Cliche in Treatment: 12 Electronic Signature(s) Signed: 09/03/2021 5:00:35 PM By: Dolan Amen RN Entered By: Dolan Amen on 09/03/2021 15:06:37 Norman Palmer ([id number]) -------------------------------------------------------------------------------- Multi Wound Chart Details [name], [name] Date of Service: 09/03/2021 2:45 PM Medical Record Number: [id number] Patient Account Number: [id number] Date of Birth/Sex: 10/07/1941 (79 y.o. M) Treating RN: Dolan Amen Primary Care Evrett Hakim: Lelon Huh Other Clinician: Referring Adlyn Fife: Lelon Huh Treating Raijon Lindfors/Extender: Skipper Cliche in Treatment: 12 Vital Signs Height(in): 72 Pulse(bpm): 66 Weight(lbs): 282 Blood Pressure(mmHg): 130/68 Body Mass Index(BMI): 38 Temperature(F): Respiratory Rate(breaths/min): 20 Photos: [1:No Photos] [2:No Photos] [N/A:N/A] Wound Location: [1:Left Gluteus] [2:Right Gluteus] [N/A:N/A] Wounding Event: [1:Pressure Injury] [2:Gradually Appeared] [N/A:N/A] Primary Etiology: [1:Pressure Ulcer] [2:Pressure Ulcer] [N/A:N/A] Comorbid History: [1:Hypertension, Type II Diabetes, History of pressure wounds, Dementia] [2:Hypertension, Type II Diabetes, History of pressure wounds, Dementia] [N/A:N/A] Date Acquired: [1:04/15/2021] [2:07/22/2021] [N/A:N/A] Weeks of Treatment: [1:12] [2:4] [N/A:N/A] Wound Status: [1:Open]  [2:Open] [N/A:N/A] Measurements L x W x D (cm) [1:0.7x0.3x2] [2:0x0x0] [N/A:N/A] Area (cm) : [1:0.165] [2:0] [N/A:N/A] Volume (cm) : [1:0.33] [2:0] [N/A:N/A] % Reduction in Area: [1:97.10%] [2:100.00%] [N/A:N/A] % Reduction in Volume: [1:99.00%] [2:100.00%] [N/A:N/A] 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] [N/A:N/A] Exudate Amount: [1:Medium] [2:None Present] [N/A:N/A] Exudate Type: [1:Serosanguineous] [2:N/A] [N/A:N/A] Exudate Color: [1:red, brown] [2:N/A] [N/A:N/A] Wound Margin: [1:Well defined, not attached] [2:Flat and Intact] [N/A:N/A] Granulation Amount: [1:None Present (0%)] [2:None Present (0%)] [N/A:N/A] Necrotic Amount: [1:None Present (0%)] [2:None Present (0%)] [N/A:N/A] Exposed Structures: [1:Fat Layer (Subcutaneous Tissue): Yes Fascia: No Tendon: No Muscle: No Joint: No Bone: No Small (1-33%)] [2:Fascia: No Fat  Layer (Subcutaneous Tissue): No Tendon: No Muscle: No Joint: No Bone: No Large (67-100%)] [N/A:N/A N/A] Treatment Notes Electronic Signature(s) Signed: 09/03/2021 5:00:35 PM By: Dolan Amen RN Entered By: Dolan Amen on 09/03/2021 15:39:00 Norman Palmer ([id number]) -------------------------------------------------------------------------------- [place] Details [name], [name] Date of Service: 09/03/2021 2:45 PM Medical Record Number: [id number] Patient Account Number: [id number] Date of Birth/Sex: 07-05-1941 (79 y.o. M) Treating RN: Dolan Amen Primary Care Gerlean Cid: Lelon Huh Other Clinician: Referring Alvey Brockel: Lelon Huh Treating Armie Moren/Extender: Skipper Cliche in Treatment: 12 Active Inactive Wound/Skin Impairment Nursing Diagnoses: Impaired tissue integrity Knowledge deficit related to ulceration/compromised skin integrity Goals: Patient/caregiver will verbalize understanding of skin care regimen Date Initiated: 06/10/2021 Date Inactivated: 08/06/2021 Target  Resolution Date: 07/15/2021 Goal Status: Met Ulcer/skin breakdown will have a volume reduction of 30% by week 4 Date Initiated: 06/10/2021 Target Resolution Date: 07/15/2021 Goal Status: Active Ulcer/skin breakdown will have a volume reduction of 50% by week 8 Date Initiated: 06/10/2021 Target Resolution Date: 08/19/2021 Goal Status: Active Ulcer/skin breakdown will have a volume reduction of 80% by week 12 Date Initiated: 06/10/2021 Target Resolution Date: 09/09/2021 Goal Status: Active Interventions: Assess patient/caregiver ability to obtain necessary supplies Assess patient/caregiver ability to perform ulcer/skin care regimen upon admission and as needed Assess ulceration(s) every visit Treatment Activities: Patient referred to home care : 06/10/2021 Notes: Electronic Signature(s) Signed: 09/03/2021 5:00:35 PM By: Dolan Amen RN Entered By: Dolan Amen on 09/03/2021 15:38:49 Norman Palmer ([id number]) -------------------------------------------------------------------------------- Pain Assessment Details [name], [name] Date of Service: 09/03/2021 2:45 PM Medical Record Number: [id number] Patient Account Number: [id number] Date of Birth/Sex: Nov 04, 1941 (79 y.o. M) Treating RN: Dolan Amen Primary Care Jancarlos Thrun: Lelon Huh Other Clinician: Referring Aminata Buffalo: Lelon Huh Treating Chrisy Hillebrand/Extender: Skipper Cliche in Treatment: 12 Active Problems Location of Pain Severity and Description of Pain Patient Has Paino No Site Locations Rate the pain. Current Pain Level: 0 Pain Management and Medication Current Pain Management: Electronic Signature(s) Signed: 09/03/2021 5:00:35 PM By: Dolan Amen RN Entered By: Dolan Amen on 09/03/2021 14:55:37 Norman Palmer ([id number]) -------------------------------------------------------------------------------- Patient/Caregiver Education Details [name], [name] Date of  Service: 09/03/2021 2:45 PM Medical Record Number: [id number] Patient Account Number: [id number] Date of Birth/Gender: 02/08/1941 (79 y.o. M) Treating RN: Dolan Amen Primary Care Physician: Lelon Huh Other Clinician: Referring Physician: Lelon Huh Treating Physician/Extender: Skipper Cliche in Treatment: 12 Education Assessment Education Provided To: Caregiver wife Education Topics Provided Wound/Skin Impairment: Methods: Explain/Verbal Responses: State content correctly [employer]) Signed: 09/03/2021 5:00:35 PM By: Dolan Amen RN Entered By: Dolan Amen on 09/03/2021 16:56:27 Norman Palmer ([id number]) -------------------------------------------------------------------------------- Wound Assessment Details [name], [name] Date of Service: 09/03/2021 2:45 PM Medical Record Number: [id number] Patient Account Number: [id number] Date of Birth/Sex: 1941-07-29 (79 y.o. 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 Wound Status: Open Wounding Event: Pressure Injury Comorbid Hypertension, Type II Diabetes, History of pressure Date Acquired: 04/15/2021 History: wounds, Dementia Weeks Of Treatment: 12 Clustered Wound: No Wound Measurements Length: (cm) 0.7 Width: (cm) 0.3 Depth: (cm) 2 Area: (cm) 0.165 Volume: (cm) 0.33 % Reduction in Area: 97.1% % Reduction in Volume: 99% Epithelialization: Small (1-33%) Tunneling: No Undermining: No Wound Description Classification: Category/Stage IV Wound Margin: Well defined, not attached Exudate Amount:  Medium Exudate Type: Serosanguineous Exudate Color: red, brown Foul Odor After Cleansing: No Slough/Fibrino Yes Wound Bed Granulation Amount: None  Present (0%) Exposed Structure Necrotic Amount: None Present (0%) Fascia Exposed: No Fat Layer (Subcutaneous Tissue) Exposed: Yes Tendon Exposed: No Muscle Exposed: No Joint Exposed: No Bone Exposed: No Treatment Notes Wound #1 (Gluteus) Wound Laterality: Left Cleanser Wound Cleanser Discharge Instruction: Wash your hands with soap and water. Remove old dressing, discard into plastic bag and place into trash. Cleanse the wound with Wound Cleanser prior to applying a clean dressing using gauze sponges, not tissues or cotton balls. Do not scrub or use excessive force. Pat dry using gauze sponges, not tissue or cotton balls. Peri-Wound Care Topical Primary Dressing Gauze Discharge Instruction: Moisten gauze with dakins solution 0.25%, pack in wound Secondary Dressing Zetuvit Plus Silicone Border Dressing 5x5 (in/in) Discharge Instruction: Apply border foam dressing Secured With Norman Palmer, Norman Palmer ([id number]) Compression Wrap Compression Stockings Add-Ons Electronic Signature(s) Signed: 09/03/2021 5:00:35 PM By: Dolan Amen RN Entered By: Dolan Amen on 09/03/2021 15:05:51 Norman Palmer ([id number]) -------------------------------------------------------------------------------- Wound Assessment Details [name], [name] Date of Service: 09/03/2021 2:45 PM Medical Record Number: [id number] Patient Account Number: [id number] Date of Birth/Sex: [birth year] (79 y.o. M) Treating RN: Dolan Amen Primary Care Nakeya Adinolfi: Lelon Huh Other Clinician: Referring Lanore Renderos: Lelon Huh Treating Chellsie Gomer/Extender: Skipper Cliche in Treatment: 12 Wound Status Wound Number: 2 Primary Pressure Ulcer Etiology: Wound Location: Right Gluteus Wound Status: Open Wounding Event: Gradually Appeared Comorbid Hypertension, Type II Diabetes, History of pressure Date Acquired: 07/22/2021 History: wounds, Dementia Weeks Of Treatment: 4 Clustered Wound: No Wound  Measurements Length: (cm) 0 Width: (cm) 0 Depth: (cm) 0 Area: (cm) 0 Volume: (cm) 0 % Reduction in Area: 100% % Reduction in Volume: 100% Epithelialization: Large (67-100%) Tunneling: No Undermining: No Wound Description Classification: Category/Stage II Wound Margin: Flat and Intact Exudate Amount: None Present Foul Odor After Cleansing: No Slough/Fibrino No Wound Bed Granulation Amount: None Present (0%) Exposed Structure Necrotic Amount: None Present (0%) Fascia Exposed: No Fat Layer (Subcutaneous Tissue) Exposed: No Tendon Exposed: No Muscle Exposed: No Joint Exposed: No Bone Exposed: No Electronic Signature(s) Signed: 09/03/2021 5:00:35 PM By: Dolan Amen RN Entered By: Dolan Amen on 09/03/2021 15:06:15 Norman Palmer ([id number]) -------------------------------------------------------------------------------- Norman Palmer: [name] Date of Service: 09/03/2021 2:45 PM Medical Record Number: [id number] Patient Account Number: [id number] Date of Birth/Sex: Jun 10, 1941 (79 y.o. M) Treating RN: Dolan Amen Primary Care Nayah Lukens: Lelon Huh Other Clinician: Referring Lorell Thibodaux: Lelon Huh Treating Jaxn Chiquito/Extender: Skipper Cliche in Treatment: 12 Vital Signs Time Taken: 14:55 Pulse (bpm): 86 Height (in): 72 Respiratory Rate (breaths/min): 20 Weight (lbs): 282 Blood Pressure (mmHg): 130/68 Body Mass Index (BMI): 38.2 Reference Range: 80 - 120 mg / dl Airway Pulse Oximetry (%): 95 Notes vitals obtained by EMS transport Electronic Signature(s) Signed: 09/03/2021 5:00:35 PM By: Dolan Amen RN Entered By: Dolan Amen on 09/03/2021 14:55:31

## 2021-09-09 ENCOUNTER — Other Ambulatory Visit: Payer: Self-pay | Admitting: Family Medicine

## 2021-09-09 ENCOUNTER — Telehealth: Payer: Self-pay | Admitting: Family Medicine

## 2021-09-09 DIAGNOSIS — G9009 Other idiopathic peripheral autonomic neuropathy: Secondary | ICD-10-CM

## 2021-09-09 NOTE — Telephone Encounter (Signed)
Verbal orders given  

## 2021-09-09 NOTE — Telephone Encounter (Signed)
That's fine

## 2021-09-09 NOTE — Telephone Encounter (Signed)
Home Health Verbal Orders - Caller/Agency: Latricia Heft Callback Number: [phone] Requesting /PT therapy  Frequency: 2x3 1x5

## 2021-09-10 ENCOUNTER — Telehealth: Payer: Self-pay | Admitting: Family Medicine

## 2021-09-10 DIAGNOSIS — L89303 Pressure ulcer of unspecified buttock, stage 3: Secondary | ICD-10-CM

## 2021-09-10 NOTE — Telephone Encounter (Signed)
[place] Number: [phone]  *Sharyn Lull called in stating pt has some rashes on his scrotum and they have been trying a few things and nothing is working. Please advise.

## 2021-09-10 NOTE — Telephone Encounter (Signed)
Please review.  Does Mr. [place] need an appointment?  Thanks,   -Jayshun Galentine

## 2021-09-10 NOTE — Telephone Encounter (Signed)
Need more info. What have they tried already.

## 2021-09-11 MED ORDER — SILVER SULFADIAZINE 1 % EX CREA: 1.0000 "application " | TOPICAL_CREAM | Freq: Every day | CUTANEOUS | 1 refills | Status: AC

## 2021-09-11 NOTE — Telephone Encounter (Signed)
Sharyn Lull advised.  RX sent to [facility].   Thanks,   -Mickel Baas

## 2021-09-11 NOTE — Telephone Encounter (Signed)
Norman Palmer has called back and shares that they have previously used Oxide Barrier Cream to treat the irritation   The patient's wife has also used antifungal cream as well  Neither treatment has been effective

## 2021-09-11 NOTE — Telephone Encounter (Signed)
Recommend they apply silvadene cream once a day. If they don't have any more can send in refill on prescription for 50gram and 1 refill.

## 2021-09-13 ENCOUNTER — Ambulatory Visit: Payer: Self-pay

## 2021-09-13 ENCOUNTER — Encounter (INDEPENDENT_AMBULATORY_CARE_PROVIDER_SITE_OTHER): Payer: Medicare Other | Admitting: Family Medicine

## 2021-09-13 DIAGNOSIS — R339 Retention of urine, unspecified: Secondary | ICD-10-CM | POA: Diagnosis not present

## 2021-09-13 DIAGNOSIS — E119 Type 2 diabetes mellitus without complications: Secondary | ICD-10-CM

## 2021-09-13 DIAGNOSIS — I10 Essential (primary) hypertension: Secondary | ICD-10-CM

## 2021-09-13 DIAGNOSIS — F0391 Unspecified dementia with behavioral disturbance: Secondary | ICD-10-CM | POA: Diagnosis not present

## 2021-09-13 DIAGNOSIS — N401 Enlarged prostate with lower urinary tract symptoms: Secondary | ICD-10-CM

## 2021-09-13 DIAGNOSIS — R262 Difficulty in walking, not elsewhere classified: Secondary | ICD-10-CM | POA: Diagnosis not present

## 2021-09-13 DIAGNOSIS — N390 Urinary tract infection, site not specified: Secondary | ICD-10-CM

## 2021-09-13 DIAGNOSIS — F329 Major depressive disorder, single episode, unspecified: Secondary | ICD-10-CM | POA: Diagnosis not present

## 2021-09-13 DIAGNOSIS — Z7984 Long term (current) use of oral hypoglycemic drugs: Secondary | ICD-10-CM | POA: Diagnosis not present

## 2021-09-13 DIAGNOSIS — L89323 Pressure ulcer of left buttock, stage 3: Secondary | ICD-10-CM

## 2021-09-13 DIAGNOSIS — Z794 Long term (current) use of insulin: Secondary | ICD-10-CM | POA: Diagnosis not present

## 2021-09-13 MED ORDER — CIPROFLOXACIN HCL 500 MG PO TABS: 500.0000 mg | ORAL_TABLET | Freq: Two times a day (BID) | ORAL | 0 refills | Status: AC

## 2021-09-13 NOTE — Telephone Encounter (Signed)
Patient experiencing reoccurring UTI's and is currently bed bound. Patient urine is dark for the past 3 days. Caller states she thought the home health nurse was going to visit. Caller states PCP is aware of reoccurring UTI's requesting antibiotic.   Wife reports 3 days ago pt.'s urine started getting brown in color, has a strong odor. States his mood changes with UTI, "gets meaner." Has recurrent UTI'S per wife. Requesting an antibiotic be sent to pharmacy. PLease advise.   Answer Assessment - Initial Assessment Questions 1. SYMPTOM: "What's the main symptom you're concerned about?" (e.g., frequency, incontinence)     Brown urine, strong odor 2. ONSET: "When did the  symptoms start?"     3 days ago 3. PAIN: "Is there any pain?" If Yes, ask: "How bad is it?" (Scale: 1-10; mild, moderate, severe)     No 4. CAUSE: "What do you think is causing the symptoms?"     UTI 5. OTHER SYMPTOMS: "Do you have any other symptoms?" (e.g., fever, flank pain, blood in urine, pain with urination)     Change in mood 6. PREGNANCY: "Is there any chance you are pregnant?" "When was your last menstrual period?"     N/a  Protocols used: Urinary Symptoms-A-AH

## 2021-09-13 NOTE — Telephone Encounter (Signed)
Have sent prescription for ciprofloxacin to [facility].

## 2021-09-13 NOTE — Telephone Encounter (Signed)
Patient's wife advised

## 2021-09-14 DIAGNOSIS — F329 Major depressive disorder, single episode, unspecified: Secondary | ICD-10-CM | POA: Diagnosis not present

## 2021-09-14 DIAGNOSIS — E119 Type 2 diabetes mellitus without complications: Secondary | ICD-10-CM | POA: Diagnosis not present

## 2021-09-14 DIAGNOSIS — R262 Difficulty in walking, not elsewhere classified: Secondary | ICD-10-CM | POA: Diagnosis not present

## 2021-09-14 DIAGNOSIS — R339 Retention of urine, unspecified: Secondary | ICD-10-CM | POA: Diagnosis not present

## 2021-09-14 DIAGNOSIS — Z7984 Long term (current) use of oral hypoglycemic drugs: Secondary | ICD-10-CM | POA: Diagnosis not present

## 2021-09-14 DIAGNOSIS — I10 Essential (primary) hypertension: Secondary | ICD-10-CM | POA: Diagnosis not present

## 2021-09-14 DIAGNOSIS — F0391 Unspecified dementia with behavioral disturbance: Secondary | ICD-10-CM | POA: Diagnosis not present

## 2021-09-14 DIAGNOSIS — L89323 Pressure ulcer of left buttock, stage 3: Secondary | ICD-10-CM | POA: Diagnosis not present

## 2021-09-14 DIAGNOSIS — N401 Enlarged prostate with lower urinary tract symptoms: Secondary | ICD-10-CM | POA: Diagnosis not present

## 2021-09-14 DIAGNOSIS — Z794 Long term (current) use of insulin: Secondary | ICD-10-CM | POA: Diagnosis not present

## 2021-09-19 ENCOUNTER — Encounter: Payer: Self-pay | Admitting: Family Medicine

## 2021-09-19 DIAGNOSIS — G9009 Other idiopathic peripheral autonomic neuropathy: Secondary | ICD-10-CM

## 2021-09-20 MED ORDER — GABAPENTIN 300 MG PO CAPS: 300.0000 mg | ORAL_CAPSULE | Freq: Two times a day (BID) | ORAL | 5 refills | Status: DC

## 2021-09-23 ENCOUNTER — Other Ambulatory Visit: Payer: Self-pay | Admitting: Family Medicine

## 2021-09-23 DIAGNOSIS — F03918 Unspecified dementia, unspecified severity, with other behavioral disturbance: Secondary | ICD-10-CM

## 2021-09-24 ENCOUNTER — Other Ambulatory Visit: Payer: Self-pay

## 2021-09-24 ENCOUNTER — Other Ambulatory Visit: Payer: Medicare Other | Admitting: Student

## 2021-09-24 ENCOUNTER — Encounter: Payer: Medicare Other | Attending: Physician Assistant | Admitting: Physician Assistant

## 2021-09-24 DIAGNOSIS — R404 Transient alteration of awareness: Secondary | ICD-10-CM | POA: Diagnosis not present

## 2021-09-24 DIAGNOSIS — L89324 Pressure ulcer of left buttock, stage 4: Secondary | ICD-10-CM | POA: Insufficient documentation

## 2021-09-24 DIAGNOSIS — L89312 Pressure ulcer of right buttock, stage 2: Secondary | ICD-10-CM | POA: Insufficient documentation

## 2021-09-24 DIAGNOSIS — M6281 Muscle weakness (generalized): Secondary | ICD-10-CM | POA: Diagnosis not present

## 2021-09-24 DIAGNOSIS — G308 Other Alzheimer's disease: Secondary | ICD-10-CM | POA: Insufficient documentation

## 2021-09-24 DIAGNOSIS — Z7401 Bed confinement status: Secondary | ICD-10-CM | POA: Diagnosis not present

## 2021-09-24 DIAGNOSIS — E11622 Type 2 diabetes mellitus with other skin ulcer: Secondary | ICD-10-CM | POA: Insufficient documentation

## 2021-09-24 DIAGNOSIS — F03918 Unspecified dementia, unspecified severity, with other behavioral disturbance: Secondary | ICD-10-CM | POA: Diagnosis not present

## 2021-09-24 DIAGNOSIS — W19XXXA Unspecified fall, initial encounter: Secondary | ICD-10-CM | POA: Diagnosis not present

## 2021-09-24 DIAGNOSIS — R52 Pain, unspecified: Secondary | ICD-10-CM | POA: Diagnosis not present

## 2021-09-24 DIAGNOSIS — Z515 Encounter for palliative care: Secondary | ICD-10-CM | POA: Diagnosis not present

## 2021-09-24 DIAGNOSIS — L89303 Pressure ulcer of unspecified buttock, stage 3: Secondary | ICD-10-CM

## 2021-09-24 DIAGNOSIS — N189 Chronic kidney disease, unspecified: Secondary | ICD-10-CM | POA: Insufficient documentation

## 2021-09-24 DIAGNOSIS — R0902 Hypoxemia: Secondary | ICD-10-CM | POA: Diagnosis not present

## 2021-09-24 DIAGNOSIS — L8995 Pressure ulcer of unspecified site, unstageable: Secondary | ICD-10-CM | POA: Diagnosis not present

## 2021-09-24 NOTE — Progress Notes (Addendum)
ORDEAN, Palmer ([id number]) Visit Report for 09/24/2021 Chief Complaint Document Details Patient Name: Norman Palmer, [name] Date of Service: 09/24/2021 10:00 AM Medical Record Number: [id number] Patient Account Number: [id number] Date of Birth/Sex: [birth year] (80 y.o. M) Treating RN: Dolan Amen Primary Care Provider: Lelon Huh Other Clinician: Referring Provider: Lelon Huh Treating Provider/Extender: Skipper Cliche in Treatment: 15 Information Obtained from: Patient Chief Complaint Pressure ulcer left gluteus and right gluteus Electronic Signature(s) Signed: 09/24/2021 10:22:16 AM By: Worthy Keeler PA-C Entered By: Worthy Keeler on 09/24/2021 10:22:15 ALANZO, LAMB ([id number]) -------------------------------------------------------------------------------- HPI Details Patient Name: Norman, Palmer. Date of Service: 09/24/2021 10:00 AM Medical Record Number: [id number] Patient Account Number: [id number] Date of Birth/Sex: 1941/10/28 (80 y.o. 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 This occurred when he was actually in a skilled nursing facility his wife tells me at [facility]. She was very upset about this. She recently took him home he unfortunately does have Alzheimer's which seems to be advancing. She notes that currently he seems to be getting worse in regard to his anger and outburst. He consistently during the time that was he was in the office today was calling his wife by explosives but I will put in the chart but nonetheless I do not think this is him she states this is nothing like how he was before the Alzheimer's caused some  any changes in him. She also notes however that he also seems to be worse than normal she is concerned that he may have an issue here with a urinary tract infection is recently had 1 and she feels like that might be returning. The patient is generally weak. He also has a history of diabetes mellitus type 2, Alzheimer's disease and chronic kidney disease although she does not know how severe. 06/18/2021 upon evaluation today patient appears to be doing well with regard to his wound. In fact the wound VAC seems to be doing an excellent job for him. There is no signs of active infection at this time which is great news. No fevers, chills, nausea, vomiting, or diarrhea. 07/09/2021 upon evaluation today patient's wound actually is showing signs of excellent improvement which is great news. I do not see any evidence of active infection which is also great news and I think the wound VAC is actually doing a great job he seems to be in pretty good spirits today all things considered. This is somewhat surprising as this probably is the best day he has had since have been helping to take care of him from the standpoint of his demeanor. 08/06/2021 upon evaluation today patient appears to be doing pretty well in regard to his wounds. The wound VAC seems to be doing excellent in regard to the left gluteal region. The right gluteal area which is somewhat open today does not appear to be bad at all and overall I think that the patient is doing quite well in that regard. Fortunately there is no signs of active infection at this time. No fevers, chills, nausea, vomiting, or diarrhea. 09/03/2021 upon evaluation today patient's wound is actually showing signs of excellent improvement which is great news and overall I am extremely pleased with where we stand today. There does not  appear to be any evidence of active infection which is great news and in general I think that we are definitely headed in the appropriate  direction. The patient's wife is likewise extremely pleased with what we are seeing currently. In fact I think that we will get a probably need to discontinue the wound VAC as its not really appropriate at trying to continue to apply this with such a small opening. 09/24/2021 upon evaluation today patient appears to be doing very well in regard to his wound in the left gluteal region. Fortunately there does not appear to be any signs of active infection at this time which is great news. Overall he seems to be managing quite nicely and this wound is getting significantly smaller. Electronic Signature(s) Signed: 09/24/2021 10:34:21 AM By: Worthy Keeler PA-C Entered By: Worthy Keeler on 09/24/2021 10:34:21 Doree Fudge ([id number]) -------------------------------------------------------------------------------- Physical Exam Details Patient Name: Norman, Palmer. Date of Service: 09/24/2021 10:00 AM Medical Record Number: [id number] Patient Account Number: [id number] Date of Birth/Sex: 23-Feb-1941 (80 y.o. M) Treating RN: Dolan Amen Primary Care Provider: Lelon Huh Other Clinician: Referring Provider: Lelon Huh Treating Provider/Extender: Skipper Cliche in Treatment: 60 Constitutional Well-nourished and well-hydrated in no acute distress. Respiratory normal breathing without difficulty. Psychiatric this patient is able to make decisions and demonstrates good insight into disease process. Alert and Oriented x 3. pleasant and cooperative. Notes Upon inspection patient's wound bed showed signs of good granulation epithelization at this point. His wife has been doing an awesome job taking care of this and overall he seems to be managing quite nicely. I am extremely pleased with where we stand today. Electronic Signature(s) Signed: 09/24/2021 10:34:35 AM By: Worthy Keeler PA-C Entered By: Worthy Keeler on 09/24/2021 10:34:35 Doree Fudge  ([id number]) -------------------------------------------------------------------------------- Physician Orders Details Patient Name: Norman, Palmer. Date of Service: 09/24/2021 10:00 AM Medical Record Number: [id number] Patient Account Number: [id number] Date of Birth/Sex: 1941/04/16 (80 y.o. M) Treating RN: Dolan Amen Primary Care Provider: Lelon Huh Other Clinician: Referring Provider: Lelon Huh Treating Provider/Extender: Skipper Cliche in Treatment: 15 Verbal / Phone Orders: No Diagnosis Coding ICD-10 Coding Code Description [phone] Pressure ulcer of left buttock, stage 4 L89.312 Pressure ulcer of right buttock, stage 2 E11.622 Type 2 diabetes mellitus with other skin ulcer G30.8 Other Alzheimer's disease N18.9 Chronic kidney disease, unspecified M62.81 Muscle weakness (generalized) Follow-up Appointments o Return Appointment in 1 month [place] [facility] [place] for wound care. May utilize formulary equivalent dressing for wound treatment orders unless otherwise specified. Home Health Nurse may visit PRN to address patientos wound care needs. o Scheduled days for dressing changes to be completed; exception, patient has scheduled wound care visit that day. Bathing/ Shower/ Hygiene o May shower; gently cleanse wound with antibacterial soap, rinse and pat dry prior to dressing wounds o No tub bath. Off-Loading o Gel wheelchair cushion - continue using o Hospital bed/mattress - continue using o Low air-loss mattress (Group 2) - continue using o Turn and reposition every 2 hours Negative Pressure Wound Therapy Wound #1 Left Gluteus o Discontinue NPWT. Wound Treatment Wound #1 - Gluteus Wound Laterality: Left Cleanser: Wound Cleanser 1 x Per Day/30 Days Discharge Instructions: Wash your hands with soap and water. Remove old dressing, discard into plastic bag and place  into trash. Cleanse the wound with Wound Cleanser prior to applying a clean dressing using gauze sponges, not  tissues or cotton balls. Do not scrub or use excessive force. Pat dry using gauze sponges, not tissue or cotton balls. Primary Dressing: Gauze packing strip 1/4 (in) 1 x Per Day/30 Days Discharge Instructions: Moisten gauze with dakins solution 0.25%, pack in wound Secondary Dressing: Gauze 1 x Per Day/30 Days Discharge Instructions: Apply dry gauze over packing strip as bolster Secondary Dressing: Zetuvit Plus Silicone Border Dressing 5x5 (in/in) 1 x Per Day/30 Days Discharge Instructions: Apply border foam dressing Electronic Signature(s) MANDELL, PANGBORN ([id number]) Signed: 09/24/2021 10:36:08 AM By: Dolan Amen RN Signed: 09/24/2021 5:40:02 PM By: Worthy Keeler PA-C Entered By: Dolan Amen on 09/24/2021 10:36:08 Doree Fudge ([id number]) -------------------------------------------------------------------------------- Problem List Details Patient Name: ALLENMICHAEL, MCPARTLIN. Date of Service: 09/24/2021 10:00 AM Medical Record Number: [id number] Patient Account Number: [id number] Date of Birth/Sex: 1941-06-19 (80 y.o. M) Treating RN: Dolan Amen Primary Care Provider: Lelon Huh Other Clinician: Referring Provider: Lelon Huh Treating Provider/Extender: Skipper Cliche in Treatment: 15 Active Problems ICD-10 Encounter Code Description Active Date MDM Diagnosis [phone] Pressure ulcer of left buttock, stage 4 06/10/2021 No Yes L89.312 Pressure ulcer of right buttock, stage 2 08/06/2021 No Yes E11.622 Type 2 diabetes mellitus with other skin ulcer 06/10/2021 No Yes G30.8 Other Alzheimer's disease 06/10/2021 No Yes N18.9 Chronic kidney disease, unspecified 06/10/2021 No Yes M62.81 Muscle weakness (generalized) 06/10/2021 No Yes Inactive Problems Resolved Problems Electronic Signature(s) Signed: 09/24/2021 10:22:07 AM By: Worthy Keeler PA-C Entered By:  Worthy Keeler on 09/24/2021 10:22:07 Doree Fudge ([id number]) -------------------------------------------------------------------------------- Progress Note Details Patient Name: Doree Fudge. Date of Service: 09/24/2021 10:00 AM Medical Record Number: [id number] Patient Account Number: [id number] Date of Birth/Sex: 07/14/1941 (80 y.o. 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 This occurred when he was actually in a skilled nursing facility his wife tells me at [facility]. She was very upset about this. She recently took him home he unfortunately does have Alzheimer's which seems to be advancing. She notes that currently he seems to be getting worse in regard to his anger and outburst. He consistently during the time that was he was in the office today was calling his wife by explosives but I will put in the chart but nonetheless I do not think this is him she states this is nothing like how he was before the Alzheimer's caused some any changes in him. She also notes however that he also seems to be worse than normal she is concerned that he may have an issue here with a urinary tract infection is recently had 1 and she feels like that might be returning. The patient is generally weak. He also has a history of diabetes mellitus type 2, Alzheimer's disease and chronic kidney disease although she does not know how severe. 06/18/2021 upon evaluation today patient appears to be doing well with regard to his wound. In fact the wound VAC seems to be doing an excellent job  for him. There is no signs of active infection at this time which is great news. No fevers, chills, nausea, vomiting, or diarrhea. 07/09/2021 upon evaluation today patient's wound actually is showing signs of excellent improvement which is  great news. I do not see any evidence of active infection which is also great news and I think the wound VAC is actually doing a great job he seems to be in pretty good spirits today all things considered. This is somewhat surprising as this probably is the best day he has had since have been helping to take care of him from the standpoint of his demeanor. 08/06/2021 upon evaluation today patient appears to be doing pretty well in regard to his wounds. The wound VAC seems to be doing excellent in regard to the left gluteal region. The right gluteal area which is somewhat open today does not appear to be bad at all and overall I think that the patient is doing quite well in that regard. Fortunately there is no signs of active infection at this time. No fevers, chills, nausea, vomiting, or diarrhea. 09/03/2021 upon evaluation today patient's wound is actually showing signs of excellent improvement which is great news and overall I am extremely pleased with where we stand today. There does not appear to be any evidence of active infection which is great news and in general I think that we are definitely headed in the appropriate direction. The patient's wife is likewise extremely pleased with what we are seeing currently. In fact I think that we will get a probably need to discontinue the wound VAC as its not really appropriate at trying to continue to apply this with such a small opening. 09/24/2021 upon evaluation today patient appears to be doing very well in regard to his wound in the left gluteal region. Fortunately there does not appear to be any signs of active infection at this time which is great news. Overall he seems to be managing quite nicely and this wound  is getting significantly smaller. Objective Constitutional Well-nourished and well-hydrated in no acute distress. Vitals Time Taken: 10:00 AM, Height: 72 in, Weight: 282 lbs, BMI: 38.2, Pulse: 99 bpm, Respiratory Rate: 16 breaths/min, Blood Pressure: 133/66 mmHg, Pulse Oximetry: 98 %. General Notes: vitals obtained by EMS transport Respiratory normal breathing without difficulty. Psychiatric this patient is able to make decisions and demonstrates good insight into disease process. Alert and Oriented x 3. pleasant and cooperative. RAY, GERVASI Yeager. ([id number]) General Notes: Upon inspection patient's wound bed showed signs of good granulation epithelization at this point. His wife has been doing an awesome job taking care of this and overall he seems to be managing quite nicely. I am extremely pleased with where we stand today. Integumentary (Hair, Skin) Wound #1 status is Open. Original cause of wound was Pressure Injury. The date acquired was: 04/15/2021. The wound has been in treatment 15 weeks. The wound is located on the Left Gluteus. The wound measures 0.4cm length x 0.3cm width x 1cm depth; 0.094cm^2 area and 0.094cm^3 volume. There is Fat Layer (Subcutaneous Tissue) exposed. There is no tunneling or undermining noted. There is a medium amount of sanguinous drainage noted. The wound margin is well defined and not attached to the wound base. There is no granulation within the wound bed. There is no necrotic tissue within the wound bed. Assessment Active Problems ICD-10 Pressure ulcer of left buttock, stage 4 Pressure ulcer of right buttock, stage 2 Type 2 diabetes mellitus with other skin ulcer Other Alzheimer's disease Chronic kidney disease, unspecified Muscle weakness (generalized) Plan 1. Would recommend currently that we going to continue with the wound care measures as before and the patient is in agreement with plan this includes the use  of the Dakin's moistened packing  strip which again is doing an awesome job. 2. I am also can recommend that we have the patient continue with appropriate offloading he seems to be doing excellent in this regard. 3. I would also recommend that his wife contact me and let me know if he develops any issues such as potential for infection or increased pain although right now he seems to be doing awesome. We will see patient back for reevaluation in 1 month here in the clinic. If anything worsens or changes patient will contact our office for additional recommendations. Electronic Signature(s) Signed: 09/24/2021 10:35:15 AM By: Worthy Keeler PA-C Entered By: Worthy Keeler on 09/24/2021 10:35:15 Doree Fudge ([id number]) -------------------------------------------------------------------------------- SuperBill Details Patient Name: SAMULE, LIFE. Date of Service: 09/24/2021 Medical Record Number: [id number] Patient Account Number: [id number] Date of Birth/Sex: 09/08/1941 (80 y.o. M) Treating RN: Dolan Amen Primary Care Provider: Lelon Huh Other Clinician: Referring Provider: Lelon Huh Treating Provider/Extender: Skipper Cliche in Treatment: 15 Diagnosis Coding ICD-10 Codes Code Description [phone] Pressure ulcer of left buttock, stage 4 L89.312 Pressure ulcer of right buttock, stage 2 E11.622 Type 2 diabetes mellitus with other skin ulcer G30.8 Other Alzheimer's disease N18.9 Chronic kidney disease, unspecified M62.81 Muscle weakness (generalized) Facility Procedures CPT4 Code: [id number] Description: 99213 - WOUND CARE VISIT-LEV 3 EST PT Modifier: Quantity: 1 Physician Procedures CPT4 Code: [id number] Description: 18841 - WC PHYS LEVEL 4 - EST PT Modifier: Quantity: 1 CPT4 Code: Description: ICD-10 Diagnosis Description L89.324 Pressure ulcer of left buttock, stage 4 E11.622 Type 2 diabetes mellitus with other skin ulcer G30.8 Other Alzheimer's disease N18.9 Chronic kidney disease,  unspecified Modifier: Quantity: Electronic Signature(s) Signed: 09/24/2021 10:45:44 AM By: Dolan Amen RN Signed: 09/24/2021 5:40:02 PM By: Worthy Keeler PA-C Previous Signature: 09/24/2021 10:35:32 AM Version By: Worthy Keeler PA-C Entered By: Dolan Amen on 09/24/2021 10:45:44

## 2021-09-24 NOTE — Progress Notes (Signed)
SAKSHAM, AKKERMAN ([id number]) Visit Report for 09/24/2021 Arrival Information Details Patient Name: Norman Palmer, [name] Date of Service: 09/24/2021 10:00 AM Medical Record Number: [id number] Patient Account Number: [id number] Date of Birth/Sex: 13-Jul-1941 (79 y.o. M) Treating RN: Dolan Amen Primary Care Semaya Vida: Lelon Huh Other Clinician: Referring Jameis Newsham: Lelon Huh Treating Liylah Najarro/Extender: Skipper Cliche in Treatment: 15 Visit Information History Since Last Visit Pain Present Now: No Patient Arrived: Stretcher Arrival Time: 10:09 Accompanied By: wife Transfer Assistance: Stretcher Patient Identification Verified: Yes Secondary Verification Process Completed: Yes Patient Requires Transmission-Based Precautions: No Patient Has Alerts: Yes Patient Alerts: DIABETIC Electronic Signature(s) Signed: 09/24/2021 4:42:54 PM By: Dolan Amen RN Entered By: Dolan Amen on 09/24/2021 10:09:28 Norman Palmer ([id number]) -------------------------------------------------------------------------------- Clinic Level of Care Assessment Details Patient Name: Norman Palmer, [name] Date of Service: 09/24/2021 10:00 AM Medical Record Number: [id number] Patient Account Number: [id number] Date of Birth/Sex: Apr 05, 1941 (79 y.o. M) Treating RN: Dolan Amen Primary Care Aditri Louischarles: Lelon Huh Other Clinician: Referring Lyza Houseworth: Lelon Huh Treating Suman Trivedi/Extender: Skipper Cliche in Treatment: 15 Clinic Level of Care Assessment Items TOOL 4 Quantity Score X - Use when only an EandM is performed on FOLLOW-UP visit 1 0 ASSESSMENTS - Nursing Assessment / Reassessment X - Reassessment of Co-morbidities (includes updates in patient status) 1 10 X- 1 5 Reassessment of Adherence to Treatment Plan ASSESSMENTS - Wound and Skin Assessment / Reassessment X - Simple Wound Assessment / Reassessment - one wound 1 5 '[]'  - 0 Complex Wound Assessment / Reassessment - multiple  wounds '[]'  - 0 Dermatologic / Skin Assessment (not related to wound area) ASSESSMENTS - Focused Assessment '[]'  - Circumferential Edema Measurements - multi extremities 0 '[]'  - 0 Nutritional Assessment / Counseling / Intervention '[]'  - 0 Lower Extremity Assessment (monofilament, tuning fork, pulses) '[]'  - 0 Peripheral Arterial Disease Assessment (using hand held doppler) ASSESSMENTS - Ostomy and/or Continence Assessment and Care '[]'  - Incontinence Assessment and Management 0 '[]'  - 0 Ostomy Care Assessment and Management (repouching, etc.) PROCESS - Coordination of Care X - Simple Patient / Family Education for ongoing care 1 15 '[]'  - 0 Complex (extensive) Patient / Family Education for ongoing care '[]'  - 0 Staff obtains Programmer, systems, Records, Test Results / Process Orders '[]'  - 0 Staff telephones HHA, Nursing Homes / Clarify orders / etc '[]'  - 0 Routine Transfer to another Facility (non-emergent condition) '[]'  - 0 Routine Hospital Admission (non-emergent condition) '[]'  - 0 New Admissions / Biomedical engineer / Ordering NPWT, Apligraf, etc. '[]'  - 0 Emergency Hospital Admission (emergent condition) X- 1 10 Simple Discharge Coordination '[]'  - 0 Complex (extensive) Discharge Coordination PROCESS - Special Needs '[]'  - Pediatric / Minor Patient Management 0 '[]'  - 0 Isolation Patient Management '[]'  - 0 Hearing / Language / Visual special needs '[]'  - 0 Assessment of Community assistance (transportation, D/C planning, etc.) '[]'  - 0 Additional assistance / Altered mentation '[]'  - 0 Support Surface(s) Assessment (bed, cushion, seat, etc.) INTERVENTIONS - Wound Cleansing / Measurement Norman Palmer, [name] ([id number]) X- 1 5 Simple Wound Cleansing - one wound '[]'  - 0 Complex Wound Cleansing - multiple wounds X- 1 5 Wound Imaging (photographs - any number of wounds) '[]'  - 0 Wound Tracing (instead of photographs) X- 1 5 Simple Wound Measurement - one wound '[]'  - 0 Complex Wound Measurement -  multiple wounds INTERVENTIONS - Wound Dressings '[]'  - Small Wound Dressing one or multiple wounds 0 X- 1 15 Medium Wound Dressing one or multiple wounds '[]'  - 0 Large  Wound Dressing one or multiple wounds '[]'  - 0 Application of Medications - topical '[]'  - 0 Application of Medications - injection INTERVENTIONS - Miscellaneous '[]'  - External ear exam 0 '[]'  - 0 Specimen Collection (cultures, biopsies, blood, body fluids, etc.) '[]'  - 0 Specimen(s) / Culture(s) sent or taken to Lab for analysis X- 1 10 Patient Transfer (multiple staff / Civil Service fast streamer / Similar devices) '[]'  - 0 Simple Staple / Suture removal (25 or less) '[]'  - 0 Complex Staple / Suture removal (26 or more) '[]'  - 0 Hypo / Hyperglycemic Management (close monitor of Blood Glucose) '[]'  - 0 Ankle / Brachial Index (ABI) - do not check if billed separately X- 1 5 Vital Signs Has the patient been seen at the hospital within the last three years: Yes Total Score: 90 Level Of Care: New/Established - Level 3 Electronic Signature(s) Signed: 09/24/2021 4:42:54 PM By: Dolan Amen RN Entered By: Dolan Amen on 09/24/2021 10:45:34 Norman Palmer ([id number]) -------------------------------------------------------------------------------- Encounter Discharge Information Details Patient Name: Norman Palmer, [name] Date of Service: 09/24/2021 10:00 AM Medical Record Number: [id number] Patient Account Number: [id number] Date of Birth/Sex: [birth year] (79 y.o. M) Treating RN: Dolan Amen Primary Care Sangeeta Youse: Lelon Huh Other Clinician: Referring Saurabh Hettich: Lelon Huh Treating Orah Sonnen/Extender: Skipper Cliche in Treatment: 15 Encounter Discharge Information Items Discharge Condition: Stable Ambulatory Status: Stretcher Discharge Destination: Home Transportation: Ambulance Accompanied By: wife Schedule Follow-up Appointment: Yes Clinical Summary of Care: Electronic Signature(s) Signed: 09/24/2021 10:46:40 AM By:  Dolan Amen RN Entered By: Dolan Amen on 09/24/2021 10:46:40 Norman Palmer ([id number]) -------------------------------------------------------------------------------- Lower Extremity Assessment Details Patient Name: Norman Palmer, [name] Date of Service: 09/24/2021 10:00 AM Medical Record Number: [id number] Patient Account Number: [id number] Date of Birth/Sex: 07-01-1941 (79 y.o. M) Treating RN: Dolan Amen Primary Care Sharmayne Jablon: Lelon Huh Other Clinician: Referring Shellsea Borunda: Lelon Huh Treating Clayden Withem/Extender: Skipper Cliche in Treatment: 15 Electronic Signature(s) Signed: 09/24/2021 4:42:54 PM By: Dolan Amen RN Entered By: Dolan Amen on 09/24/2021 10:19:14 Norman Palmer ([id number]) -------------------------------------------------------------------------------- Multi Wound Chart Details Patient Name: Norman Palmer, [name] Date of Service: 09/24/2021 10:00 AM Medical Record Number: [id number] Patient Account Number: [id number] Date of Birth/Sex: 07/04/1941 (79 y.o. M) Treating RN: Dolan Amen Primary Care Sinai Mahany: Lelon Huh Other Clinician: Referring Kadien Lineman: Lelon Huh Treating [place] Kielbasa/Extender: Skipper Cliche in Treatment: 15 Vital Signs Height(in): 72 Pulse(bpm): 99 Weight(lbs): 282 Blood Pressure(mmHg): 133/66 Body Mass Index(BMI): 38 Temperature(F): Respiratory Rate(breaths/min): 16 Photos: [N/A:N/A] Wound Location: Left Gluteus N/A N/A Wounding Event: Pressure Injury N/A N/A Primary Etiology: Pressure Ulcer N/A N/A Comorbid History: Hypertension, Type II Diabetes, N/A N/A History of pressure wounds, Dementia Date Acquired: 04/15/2021 N/A N/A Weeks of Treatment: 15 N/A N/A Wound Status: Open N/A N/A Measurements L x W x D (cm) 0.4x0.3x1 N/A N/A Area (cm) : 0.094 N/A N/A Volume (cm) : 0.094 N/A N/A % Reduction in Area: 98.30% N/A N/A % Reduction in Volume: 99.70% N/A N/A Classification: Category/Stage IV  N/A N/A Exudate Amount: Medium N/A N/A Exudate Type: Sanguinous N/A N/A Exudate Color: red N/A N/A Wound Margin: Well defined, not attached N/A N/A Granulation Amount: None Present (0%) N/A N/A Necrotic Amount: None Present (0%) N/A N/A Exposed Structures: Fat Layer (Subcutaneous Tissue): N/A N/A Yes Fascia: No Tendon: No Muscle: No Joint: No Bone: No Epithelialization: Medium (34-66%) N/A N/A Treatment Notes Electronic Signature(s) Signed: 09/24/2021 10:34:44 AM By: Dolan Amen RN Entered By: Dolan Amen on 09/24/2021 10:34:44 Norman Palmer ([id number]) -------------------------------------------------------------------------------- [place] Details Patient Name: Norman Palmer,  Norman M. Date of Service: 09/24/2021 10:00 AM Medical Record Number: [id number] Patient Account Number: [id number] Date of Birth/Sex: [birth year] (79 y.o. M) Treating RN: Dolan Amen Primary Care Erianna Jolly: Lelon Huh Other Clinician: Referring Jakye Mullens: Lelon Huh Treating Alexx Mcburney/Extender: Skipper Cliche in Treatment: 15 Active Inactive Wound/Skin Impairment Nursing Diagnoses: Impaired tissue integrity Knowledge deficit related to ulceration/compromised skin integrity Goals: Patient/caregiver will verbalize understanding of skin care regimen Date Initiated: 06/10/2021 Date Inactivated: 08/06/2021 Target Resolution Date: 07/15/2021 Goal Status: Met Ulcer/skin breakdown will have a volume reduction of 30% by week 4 Date Initiated: 06/10/2021 Target Resolution Date: 07/15/2021 Goal Status: Active Ulcer/skin breakdown will have a volume reduction of 50% by week 8 Date Initiated: 06/10/2021 Target Resolution Date: 08/19/2021 Goal Status: Active Ulcer/skin breakdown will have a volume reduction of 80% by week 12 Date Initiated: 06/10/2021 Target Resolution Date: 09/09/2021 Goal Status: Active Interventions: Assess patient/caregiver ability to obtain necessary  supplies Assess patient/caregiver ability to perform ulcer/skin care regimen upon admission and as needed Assess ulceration(s) every visit Treatment Activities: Patient referred to home care : 06/10/2021 Notes: Electronic Signature(s) Signed: 09/24/2021 10:34:35 AM By: Dolan Amen RN Entered By: Dolan Amen on 09/24/2021 10:34:35 Norman Palmer ([id number]) -------------------------------------------------------------------------------- Pain Assessment Details Patient Name: Norman Palmer, [name] Date of Service: 09/24/2021 10:00 AM Medical Record Number: [id number] Patient Account Number: [id number] Date of Birth/Sex: 1941/03/22 (79 y.o. M) Treating RN: Dolan Amen Primary Care Neshawn Aird: Lelon Huh Other Clinician: Referring Kodi Steil: Lelon Huh Treating Lailyn Appelbaum/Extender: Skipper Cliche in Treatment: 15 Active Problems Location of Pain Severity and Description of Pain Patient Has Paino No Site Locations Pain Management and Medication Current Pain Management: Electronic Signature(s) Signed: 09/24/2021 4:42:54 PM By: Dolan Amen RN Entered By: Dolan Amen on 09/24/2021 10:10:13 Norman Palmer ([id number]) -------------------------------------------------------------------------------- Patient/Caregiver Education Details Patient Name: Norman Palmer, [name] Date of Service: 09/24/2021 10:00 AM Medical Record Number: [id number] Patient Account Number: [id number] Date of Birth/Gender: June 29, 1941 (79 y.o. M) Treating RN: Dolan Amen Primary Care Physician: Lelon Huh Other Clinician: Referring Physician: Lelon Huh Treating Physician/Extender: Skipper Cliche in Treatment: 15 Education Assessment Education Provided To: Caregiver wife Education Topics Provided Wound/Skin Impairment: Methods: Explain/Verbal Responses: State content correctly Electronic Signature(s) Signed: 09/24/2021 4:42:54 PM By: Dolan Amen RN Entered By: Dolan Amen  on 09/24/2021 10:45:56 Norman Palmer ([id number]) -------------------------------------------------------------------------------- Wound Assessment Details Patient Name: Norman Palmer, [name] Date of Service: 09/24/2021 10:00 AM Medical Record Number: [id number] Patient Account Number: [id number] Date of Birth/Sex: 11/28/1941 (79 y.o. M) Treating RN: Dolan Amen Primary Care [place] Fant: Lelon Huh Other Clinician: Referring Lecil Tapp: Lelon Huh Treating Chrisha Vogel/Extender: Skipper Cliche in Treatment: 15 Wound Status Wound Number: 1 Primary Pressure Ulcer Etiology: Wound Location: Left Gluteus Wound Status: Open Wounding Event: Pressure Injury Comorbid Hypertension, Type II Diabetes, History of pressure Date Acquired: 04/15/2021 History: wounds, Dementia Weeks Of Treatment: 15 Clustered Wound: No Photos Wound Measurements Length: (cm) 0.4 Width: (cm) 0.3 Depth: (cm) 1 Area: (cm) 0.094 Volume: (cm) 0.094 % Reduction in Area: 98.3% % Reduction in Volume: 99.7% Epithelialization: Medium (34-66%) Tunneling: No Undermining: No Wound Description Classification: Category/Stage IV Wound Margin: Well defined, not attached Exudate Amount: Medium Exudate Type: Sanguinous Exudate Color: red Foul Odor After Cleansing: No Slough/Fibrino Yes Wound Bed Granulation Amount: None Present (0%) Exposed Structure Necrotic Amount: None Present (0%) Fascia Exposed: No Fat Layer (Subcutaneous Tissue) Exposed: Yes Tendon Exposed: No Muscle Exposed: No Joint Exposed: No Bone Exposed: No Treatment Notes Wound #1 (Gluteus) Wound Laterality: Left Cleanser Wound Cleanser Discharge  Instruction: Wash your hands with soap and water. Remove old dressing, discard into plastic bag and place into trash. Cleanse the wound with Wound Cleanser prior to applying a clean dressing using gauze sponges, not tissues or cotton balls. Do not scrub or use excessive force. Pat dry using gauze  sponges, not tissue or cotton balls. Norman Palmer, Norman Palmer ([id number]) Peri-Wound Care Topical Primary Dressing Gauze packing strip 1/4 (in) Discharge Instruction: Moisten gauze with dakins solution 0.25%, pack in wound Secondary Dressing Gauze Discharge Instruction: Apply dry gauze over packing strip as bolster Zetuvit Plus Silicone Border Dressing 5x5 (in/in) Discharge Instruction: Apply border foam dressing Secured With Compression Wrap Compression Stockings Add-Ons Electronic Signature(s) Signed: 09/24/2021 4:42:54 PM By: Dolan Amen RN Entered By: Dolan Amen on 09/24/2021 10:18:19 Norman Palmer ([id number]) -------------------------------------------------------------------------------- [place] Details Patient Name: [name] Date of Service: 09/24/2021 10:00 AM Medical Record Number: [id number] Patient Account Number: [id number] Date of Birth/Sex: 02-Jun-1941 (79 y.o. M) Treating RN: Dolan Amen Primary Care Zeeva Courser: Lelon Huh Other Clinician: Referring Allister Lessley: Lelon Huh Treating Kendalyn Cranfield/Extender: Skipper Cliche in Treatment: 15 Vital Signs Time Taken: 10:00 Pulse (bpm): 99 Height (in): 72 Respiratory Rate (breaths/min): 16 Weight (lbs): 282 Blood Pressure (mmHg): 133/66 Body Mass Index (BMI): 38.2 Reference Range: 80 - 120 mg / dl Airway Pulse Oximetry (%): 98 Notes vitals obtained by EMS transport Electronic Signature(s) Signed: 09/24/2021 4:42:54 PM By: Dolan Amen RN Entered By: Dolan Amen on 09/24/2021 10:10:08

## 2021-09-24 NOTE — Progress Notes (Signed)
[facility] Palliative Care Consult Note Telephone: [phone]  Fax: [phone]    Date of encounter: 09/24/21 12:57 PM PATIENT NAME: Norman Norman [address] [place] [address]   [phone] (home) [phone] (work) DOB: 1941-05-09 MRN: [id number] PRIMARY CARE PROVIDER:    Birdie Sons, MD,  [address] [place] [address] [phone]  REFERRING PROVIDER:   Birdie Sons, [place],  [place] [address] [phone]  RESPONSIBLE PARTY:    Contact Information     Name Relation Home Work [place] [phone]  [phone]   Chayson, Charters   [phone]   Cancel,Tiffany Relative   [phone]        I met face to face with patient and family in the home. Palliative Care was asked to follow this patient by consultation request of  Fisher, Kirstie Peri, MD to address advance care planning and complex medical decision making. This is a follow up visit.                                   ASSESSMENT AND PLAN / RECOMMENDATIONS:   Advance Care Planning/Goals of Care: Goals include to maximize quality of life and symptom management.  CODE STATUS: DNR  Symptom Management/Plan:  Dementia-patient dependent for all adl's. Wife and caregivers to continue redirecting, reorienting as needed. Monitor for aspiration; modified texture diet encouraged. Aspiration precautions. Monitor for falls safety. Continue PT for as directed for weakness, impaired mobility. Continue medications for dementia, agitation as directed.    Stage 3 wound-wound to buttocks. Patient seen by wound clinic earlier today. Area is improved in size; continue daily dressing care as directed. Continue air mattress to bed; turn and reposition every two hours.    Pain-patient with chronic lower back pain; continue tramadol as directed.     Follow up Palliative Care Visit: Palliative care will  continue to follow for complex medical decision making, advance care planning, and clarification of goals. Return in 8 weeks or prn.  I spent 40 minutes providing this consultation. More than 50% of the time in this consultation was spent in counseling and care coordination.   PPS: 30%  HOSPICE ELIGIBILITY/DIAGNOSIS: TBD  Chief Complaint: Palliative Medicine follow up visit.   HISTORY OF PRESENT ILLNESS:  Norman Norman is a 80 y.o. year old male  with Dementia, essential hypertension, stage 3 pressure injury to buttocks, low back pain, BPH, OSA, hx of basal cell carcinoma.    Patient has been stable. Patient was seen by wound clinic earlier today.  Wound improving, wound decreased in size.  Patient now receiving daily dressing changes wound VAC had been discontinued. Receiving [facility] SN twice weekly and PT through [facility]. Patient denies having pain. Wife states pain is currently managed with current regimen. Patient has good appetite; daily blood sugars range between 120-160 mg/dL. Goal is 140 mg/dL. Patient was treated for a urinary tract infection recently. Wife states patient still with agitation, aggressive towards others. Patient able to answer some direct questions, otherwise HPI and ROS obtained per wife due to his dementia.  A 10-point review of systems is negative, except for the pertinent positives and negatives detailed in the HPI.     History obtained from review of EMR, discussion with primary team, and interview with family, facility staff/caregiver and/or Norman Norman.  I reviewed available labs,  medications, imaging, studies and related documents from the EMR.  Records reviewed and summarized above.    Physical Exam: Constitutional: NAD General: frail appearing, obese  EYES: anicteric sclera, lids intact, no discharge  ENMT: intact hearing, oral mucous membranes moist, dentition intact CV: S1S2, RRR, no LE edema Pulmonary: LCTA, no increased work of breathing, no cough,  room air Abdomen: normo-active BS + 4 quadrants, soft and non tender GU: deferred MSK: non- ambulatory Skin: warm and dry, no rashes or wounds on visible skin Neuro:  generalized weakness, A & O x 2 Psych: non-anxious affect, pleasant mood today Hem/lymph/immuno: no widespread bruising   Thank you for the opportunity to participate in the care of Norman Norman.  The palliative care team will continue to follow. Please call our office at [phone] if we can be of additional assistance.   Ezekiel Slocumb, NP   COVID-19 PATIENT SCREENING TOOL Asked and negative response unless otherwise noted:   Have you had symptoms of covid, tested positive or been in contact with someone with symptoms/positive test in the past 5-10 days? No

## 2021-09-25 ENCOUNTER — Other Ambulatory Visit: Payer: Self-pay | Admitting: Family Medicine

## 2021-09-25 DIAGNOSIS — N390 Urinary tract infection, site not specified: Secondary | ICD-10-CM

## 2021-09-27 ENCOUNTER — Ambulatory Visit: Payer: Self-pay

## 2021-09-27 NOTE — Telephone Encounter (Signed)
Please review for Dr. Fisher.   Thanks,   -Mahi Zabriskie  

## 2021-09-27 NOTE — Telephone Encounter (Signed)
Left detailed voice message advising no availble appts, patient would need evaluation at [facility]

## 2021-09-27 NOTE — Telephone Encounter (Signed)
Patient wife called in to say that patient still have a UTI even though his treatment stopped a week ago she states that his urine have a strong odor. Also that his attitude have changed where he was very mean when that is not his normal behavior and she is concerned . Asking for a call back at     Ph# [phone]  Wife reports pt. Finished antibiotics for UTI last week. Yesterday started having dark urine. Strong odor to urine and has the attitude "change he has with a UTI - he gets mean." Requesting an antibiotic be sent to his pharmacy. Pt. Is still bed bound and has to travel by stretcher for an office visit. Please advise wife.   Answer Assessment - Initial Assessment Questions 1. SYMPTOM: "What's the main symptom you're concerned about?" (e.g., frequency, incontinence)     Urine has strong smell, brown in color 2. ONSET: "When did the  symptoms  start?"     Yesterday 3. PAIN: "Is there any pain?" If Yes, ask: "How bad is it?" (Scale: 1-10; mild, moderate, severe)     Unsure 4. CAUSE: "What do you think is causing the symptoms?"     UTI 5. OTHER SYMPTOMS: "Do you have any other symptoms?" (e.g., fever, flank pain, blood in urine, pain with urination)     Gold color urine, attitude change 6. PREGNANCY: "Is there any chance you are pregnant?" "When was your last menstrual period?"     N/a  Protocols used: Urinary Symptoms-A-AH

## 2021-10-02 ENCOUNTER — Telehealth: Payer: Self-pay

## 2021-10-02 NOTE — Telephone Encounter (Signed)
Contacted patients wife to get more information in regards to what patients current symptoms are and need for U/A? Patients wife states that patient has a history of recurrent UTI and states that when he has an infection his behavior/attitude is more aggressive. Patient wife denied symptoms of dysuria, hematuria or fever but states that urine is brown in color and has been present for one week. Please advise. KW

## 2021-10-02 NOTE — Telephone Encounter (Signed)
That's fine. Can give verbal order for u/a and urine culture.

## 2021-10-02 NOTE — Telephone Encounter (Addendum)
I called and spoke with nurse Norman Palmer at [facility] [phone]). Verbal order given for UA and urine culture. Norman Palmer plans to go out to his home to collect a urine sample tomorrow. Norman Palmer says that the last time she tried to collect a urine sample by doing an in and out cath, she was unsuccessful. Patient's wife has ordered some condom catheters that will be delivered to the home tomorrow. Norman Palmer says that this will help to collect a urine sample easier.

## 2021-10-02 NOTE — Telephone Encounter (Signed)
Copied from [place] [phone]. Topic: General - Other >> Oct 01, 2021 12:19 PM Loma [place] wrote: Reason for CRM: Pt wife, Santiago Glad has called in and states that [facility] comes out to see pt tomorrow. She is wanting Dr Caryn Section to place an order to [facility] for them to do a urine test tomorrow as she feels pt has another UTI. Karen's # for questions re is [phone]

## 2021-10-03 ENCOUNTER — Encounter: Payer: Self-pay | Admitting: Family Medicine

## 2021-10-03 DIAGNOSIS — N390 Urinary tract infection, site not specified: Secondary | ICD-10-CM | POA: Diagnosis not present

## 2021-10-07 ENCOUNTER — Ambulatory Visit: Payer: Self-pay | Admitting: *Deleted

## 2021-10-07 ENCOUNTER — Emergency Department: Payer: Medicare Other

## 2021-10-07 ENCOUNTER — Encounter: Payer: Self-pay | Admitting: Emergency Medicine

## 2021-10-07 ENCOUNTER — Inpatient Hospital Stay
Admission: EM | Admit: 2021-10-07 | Discharge: 2021-10-09 | DRG: 682 | Disposition: A | Discharge status: Home Health | Payer: Medicare Other | Attending: Internal Medicine | Admitting: Internal Medicine

## 2021-10-07 ENCOUNTER — Other Ambulatory Visit: Payer: Self-pay

## 2021-10-07 DIAGNOSIS — F444 Conversion disorder with motor symptom or deficit: Secondary | ICD-10-CM | POA: Diagnosis not present

## 2021-10-07 DIAGNOSIS — Z8249 Family history of ischemic heart disease and other diseases of the circulatory system: Secondary | ICD-10-CM

## 2021-10-07 DIAGNOSIS — N138 Other obstructive and reflux uropathy: Secondary | ICD-10-CM | POA: Diagnosis not present

## 2021-10-07 DIAGNOSIS — E875 Hyperkalemia: Secondary | ICD-10-CM | POA: Diagnosis not present

## 2021-10-07 DIAGNOSIS — Z20822 Contact with and (suspected) exposure to covid-19: Secondary | ICD-10-CM | POA: Diagnosis present

## 2021-10-07 DIAGNOSIS — G309 Alzheimer's disease, unspecified: Secondary | ICD-10-CM | POA: Diagnosis present

## 2021-10-07 DIAGNOSIS — N401 Enlarged prostate with lower urinary tract symptoms: Secondary | ICD-10-CM | POA: Diagnosis present

## 2021-10-07 DIAGNOSIS — R338 Other retention of urine: Secondary | ICD-10-CM | POA: Diagnosis present

## 2021-10-07 DIAGNOSIS — Z66 Do not resuscitate: Secondary | ICD-10-CM | POA: Diagnosis present

## 2021-10-07 DIAGNOSIS — E872 Acidosis, unspecified: Secondary | ICD-10-CM | POA: Diagnosis not present

## 2021-10-07 DIAGNOSIS — N3001 Acute cystitis with hematuria: Secondary | ICD-10-CM | POA: Diagnosis not present

## 2021-10-07 DIAGNOSIS — Z794 Long term (current) use of insulin: Secondary | ICD-10-CM

## 2021-10-07 DIAGNOSIS — L89153 Pressure ulcer of sacral region, stage 3: Secondary | ICD-10-CM | POA: Diagnosis not present

## 2021-10-07 DIAGNOSIS — Z993 Dependence on wheelchair: Secondary | ICD-10-CM

## 2021-10-07 DIAGNOSIS — Z87440 Personal history of urinary (tract) infections: Secondary | ICD-10-CM

## 2021-10-07 DIAGNOSIS — Z85828 Personal history of other malignant neoplasm of skin: Secondary | ICD-10-CM

## 2021-10-07 DIAGNOSIS — Z7984 Long term (current) use of oral hypoglycemic drugs: Secondary | ICD-10-CM

## 2021-10-07 DIAGNOSIS — N35812 Other urethral bulbous stricture, male: Secondary | ICD-10-CM | POA: Diagnosis present

## 2021-10-07 DIAGNOSIS — E1140 Type 2 diabetes mellitus with diabetic neuropathy, unspecified: Secondary | ICD-10-CM | POA: Diagnosis not present

## 2021-10-07 DIAGNOSIS — I959 Hypotension, unspecified: Secondary | ICD-10-CM | POA: Diagnosis not present

## 2021-10-07 DIAGNOSIS — I517 Cardiomegaly: Secondary | ICD-10-CM | POA: Diagnosis not present

## 2021-10-07 DIAGNOSIS — N183 Chronic kidney disease, stage 3 unspecified: Secondary | ICD-10-CM

## 2021-10-07 DIAGNOSIS — G9341 Metabolic encephalopathy: Secondary | ICD-10-CM | POA: Diagnosis not present

## 2021-10-07 DIAGNOSIS — Z800 Family history of malignant neoplasm of digestive organs: Secondary | ICD-10-CM

## 2021-10-07 DIAGNOSIS — N368 Other specified disorders of urethra: Secondary | ICD-10-CM | POA: Diagnosis not present

## 2021-10-07 DIAGNOSIS — N320 Bladder-neck obstruction: Secondary | ICD-10-CM | POA: Diagnosis not present

## 2021-10-07 DIAGNOSIS — E1122 Type 2 diabetes mellitus with diabetic chronic kidney disease: Secondary | ICD-10-CM | POA: Diagnosis present

## 2021-10-07 DIAGNOSIS — Z6838 Body mass index (BMI) 38.0-38.9, adult: Secondary | ICD-10-CM | POA: Diagnosis not present

## 2021-10-07 DIAGNOSIS — F03918 Unspecified dementia, unspecified severity, with other behavioral disturbance: Secondary | ICD-10-CM | POA: Diagnosis present

## 2021-10-07 DIAGNOSIS — Z9359 Other cystostomy status: Secondary | ICD-10-CM | POA: Diagnosis not present

## 2021-10-07 DIAGNOSIS — N390 Urinary tract infection, site not specified: Secondary | ICD-10-CM | POA: Diagnosis not present

## 2021-10-07 DIAGNOSIS — Z7401 Bed confinement status: Secondary | ICD-10-CM

## 2021-10-07 DIAGNOSIS — Z8614 Personal history of Methicillin resistant Staphylococcus aureus infection: Secondary | ICD-10-CM | POA: Diagnosis not present

## 2021-10-07 DIAGNOSIS — Z881 Allergy status to other antibiotic agents status: Secondary | ICD-10-CM

## 2021-10-07 DIAGNOSIS — A419 Sepsis, unspecified organism: Secondary | ICD-10-CM

## 2021-10-07 DIAGNOSIS — R32 Unspecified urinary incontinence: Secondary | ICD-10-CM | POA: Diagnosis not present

## 2021-10-07 DIAGNOSIS — N1831 Chronic kidney disease, stage 3a: Secondary | ICD-10-CM | POA: Diagnosis not present

## 2021-10-07 DIAGNOSIS — N179 Acute kidney failure, unspecified: Principal | ICD-10-CM

## 2021-10-07 DIAGNOSIS — N35919 Unspecified urethral stricture, male, unspecified site: Secondary | ICD-10-CM | POA: Diagnosis present

## 2021-10-07 DIAGNOSIS — I10 Essential (primary) hypertension: Secondary | ICD-10-CM | POA: Diagnosis not present

## 2021-10-07 DIAGNOSIS — F02818 Dementia in other diseases classified elsewhere, unspecified severity, with other behavioral disturbance: Secondary | ICD-10-CM | POA: Diagnosis present

## 2021-10-07 DIAGNOSIS — R404 Transient alteration of awareness: Secondary | ICD-10-CM | POA: Diagnosis not present

## 2021-10-07 DIAGNOSIS — R5381 Other malaise: Secondary | ICD-10-CM | POA: Diagnosis present

## 2021-10-07 DIAGNOSIS — Z888 Allergy status to other drugs, medicaments and biological substances status: Secondary | ICD-10-CM

## 2021-10-07 DIAGNOSIS — Z79899 Other long term (current) drug therapy: Secondary | ICD-10-CM

## 2021-10-07 DIAGNOSIS — R531 Weakness: Secondary | ICD-10-CM | POA: Diagnosis not present

## 2021-10-07 DIAGNOSIS — N3289 Other specified disorders of bladder: Secondary | ICD-10-CM | POA: Diagnosis not present

## 2021-10-07 DIAGNOSIS — Z87891 Personal history of nicotine dependence: Secondary | ICD-10-CM

## 2021-10-07 DIAGNOSIS — B3741 Candidal cystitis and urethritis: Secondary | ICD-10-CM | POA: Diagnosis present

## 2021-10-07 DIAGNOSIS — L89303 Pressure ulcer of unspecified buttock, stage 3: Secondary | ICD-10-CM | POA: Diagnosis present

## 2021-10-07 DIAGNOSIS — J9811 Atelectasis: Secondary | ICD-10-CM | POA: Diagnosis not present

## 2021-10-07 HISTORY — DX: Hypospadias, unspecified: Q54.9

## 2021-10-07 LAB — SODIUM, URINE, RANDOM: Sodium, Ur: 46 mmol/L

## 2021-10-07 LAB — COMPREHENSIVE METABOLIC PANEL
ALT: 14 U/L (ref 0–44)
AST: 23 U/L (ref 15–41)
Albumin: 3.1 g/dL — ABNORMAL LOW (ref 3.5–5.0)
Alkaline Phosphatase: 53 U/L (ref 38–126)
Anion gap: 13 (ref 5–15)
BUN: 68 mg/dL — ABNORMAL HIGH (ref 8–23)
CO2: 22 mmol/L (ref 22–32)
Calcium: 9.2 mg/dL (ref 8.9–10.3)
Chloride: 99 mmol/L (ref 98–111)
Creatinine, Ser: 1.93 mg/dL — ABNORMAL HIGH (ref 0.61–1.24)
GFR, Estimated: 35 mL/min — ABNORMAL LOW (ref >60)
Glucose, Bld: 95 mg/dL (ref 70–99)
Potassium: 5.5 mmol/L — ABNORMAL HIGH (ref 3.5–5.1)
Sodium: 134 mmol/L — ABNORMAL LOW (ref 135–145)
Total Bilirubin: 0.3 mg/dL (ref 0.3–1.2)
Total Protein: 6.7 g/dL (ref 6.5–8.1)

## 2021-10-07 LAB — URINALYSIS, COMPLETE (UACMP) WITH MICROSCOPIC
Bilirubin Urine: NEGATIVE
Glucose, UA: NEGATIVE mg/dL
Ketones, ur: NEGATIVE mg/dL
Nitrite: NEGATIVE
Protein, ur: 30 mg/dL — AB
Specific Gravity, Urine: 1.011 (ref 1.005–1.030)
WBC, UA: >50 WBC/hpf — ABNORMAL HIGH (ref 0–5)
pH: 5 (ref 5.0–8.0)

## 2021-10-07 LAB — CBC WITH DIFFERENTIAL/PLATELET
Abs Immature Granulocytes: 0.15 10*3/uL — ABNORMAL HIGH (ref 0.00–0.07)
Basophils Absolute: 0.1 10*3/uL (ref 0.0–0.1)
Basophils Relative: 1 %
Eosinophils Absolute: 0.2 10*3/uL (ref 0.0–0.5)
Eosinophils Relative: 2 %
HCT: 35 % — ABNORMAL LOW (ref 39.0–52.0)
Hemoglobin: 11.4 g/dL — ABNORMAL LOW (ref 13.0–17.0)
Immature Granulocytes: 1 %
Lymphocytes Relative: 25 %
Lymphs Abs: 2.8 10*3/uL (ref 0.7–4.0)
MCH: 31.1 pg (ref 26.0–34.0)
MCHC: 32.6 g/dL (ref 30.0–36.0)
MCV: 95.4 fL (ref 80.0–100.0)
Monocytes Absolute: 1 10*3/uL (ref 0.1–1.0)
Monocytes Relative: 9 %
Neutro Abs: 7 10*3/uL (ref 1.7–7.7)
Neutrophils Relative %: 62 %
Platelets: 339 10*3/uL (ref 150–400)
RBC: 3.67 MIL/uL — ABNORMAL LOW (ref 4.22–5.81)
RDW: 13.2 % (ref 11.5–15.5)
WBC: 11.3 10*3/uL — ABNORMAL HIGH (ref 4.0–10.5)
nRBC: 0 % (ref 0.0–0.2)

## 2021-10-07 LAB — PROTEIN / CREATININE RATIO, URINE
Creatinine, Urine: 63 mg/dL
Protein Creatinine Ratio: 0.67 mg/mg{Cre} — ABNORMAL HIGH (ref 0.00–0.15)
Total Protein, Urine: 42 mg/dL

## 2021-10-07 LAB — RENAL FUNCTION PANEL
Albumin: 3.1 g/dL — ABNORMAL LOW (ref 3.5–5.0)
Anion gap: 7 (ref 5–15)
BUN: 66 mg/dL — ABNORMAL HIGH (ref 8–23)
CO2: 24 mmol/L (ref 22–32)
Calcium: 8.5 mg/dL — ABNORMAL LOW (ref 8.9–10.3)
Chloride: 102 mmol/L (ref 98–111)
Creatinine, Ser: 1.56 mg/dL — ABNORMAL HIGH (ref 0.61–1.24)
GFR, Estimated: 45 mL/min — ABNORMAL LOW (ref >60)
Glucose, Bld: 117 mg/dL — ABNORMAL HIGH (ref 70–99)
Phosphorus: 4.3 mg/dL (ref 2.5–4.6)
Potassium: 4.9 mmol/L (ref 3.5–5.1)
Sodium: 133 mmol/L — ABNORMAL LOW (ref 135–145)

## 2021-10-07 LAB — PROTIME-INR
INR: 1 (ref 0.8–1.2)
Prothrombin Time: 13.1 seconds (ref 11.4–15.2)

## 2021-10-07 LAB — RESP PANEL BY RT-PCR (FLU A&B, COVID) ARPGX2
Influenza A by PCR: NEGATIVE
Influenza B by PCR: NEGATIVE
SARS Coronavirus 2 by RT PCR: NEGATIVE

## 2021-10-07 LAB — LACTIC ACID, PLASMA
Lactic Acid, Venous: 3.3 mmol/L (ref 0.5–1.9)
Lactic Acid, Venous: 2 mmol/L (ref 0.5–1.9)
Lactic Acid, Venous: 1.5 mmol/L (ref 0.5–1.9)

## 2021-10-07 LAB — CREATININE, URINE, RANDOM: Creatinine, Urine: 60 mg/dL

## 2021-10-07 LAB — APTT: aPTT: 30 seconds (ref 24–36)

## 2021-10-07 IMAGING — CR DG CHEST 2V
2 series · 2 of 2 positions shown · non-contrast
Comparison: Chest radiograph [DATE]

CLINICAL DATA: Possible sepsis

EXAM:
CHEST - 2 VIEW

[chest lat]
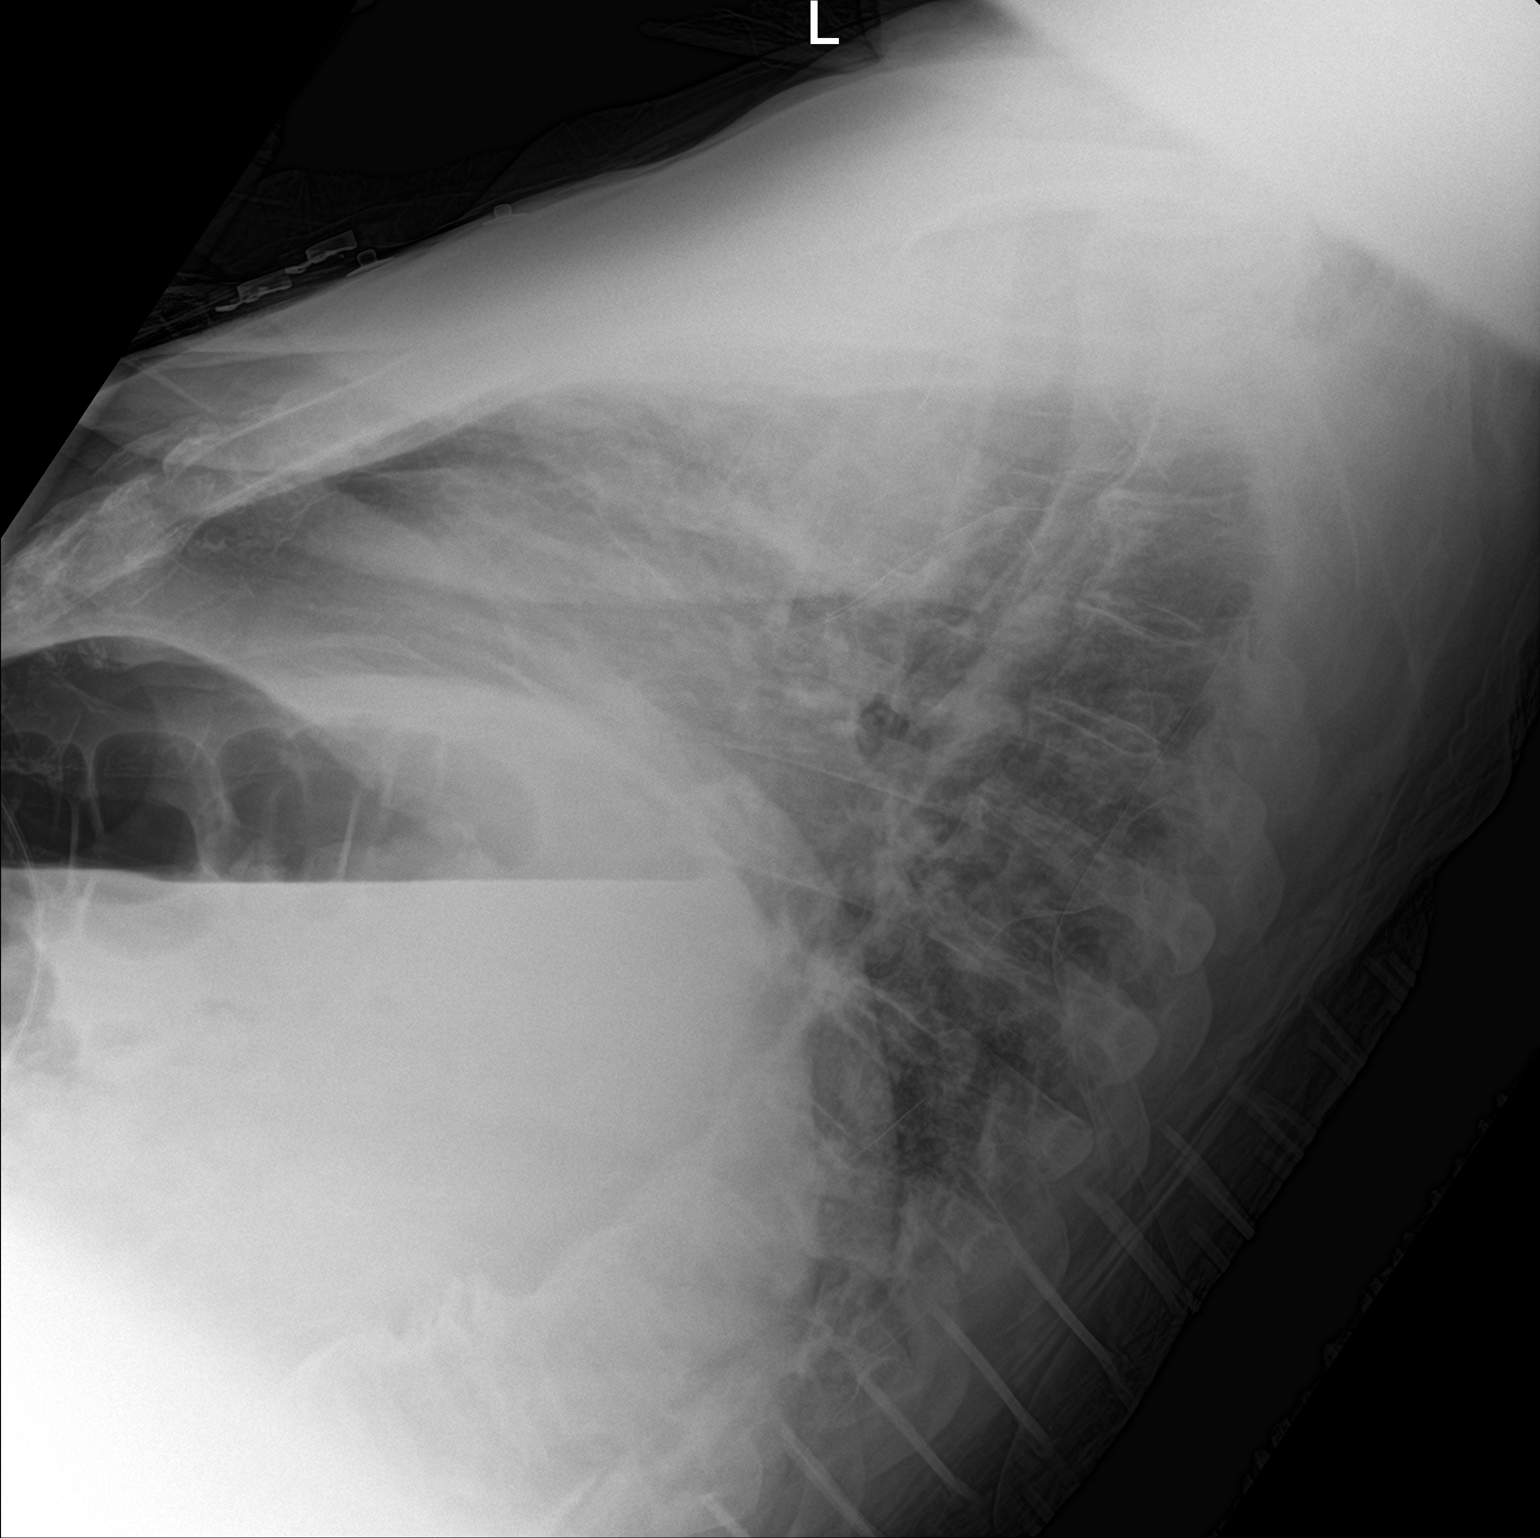

[chest ap]
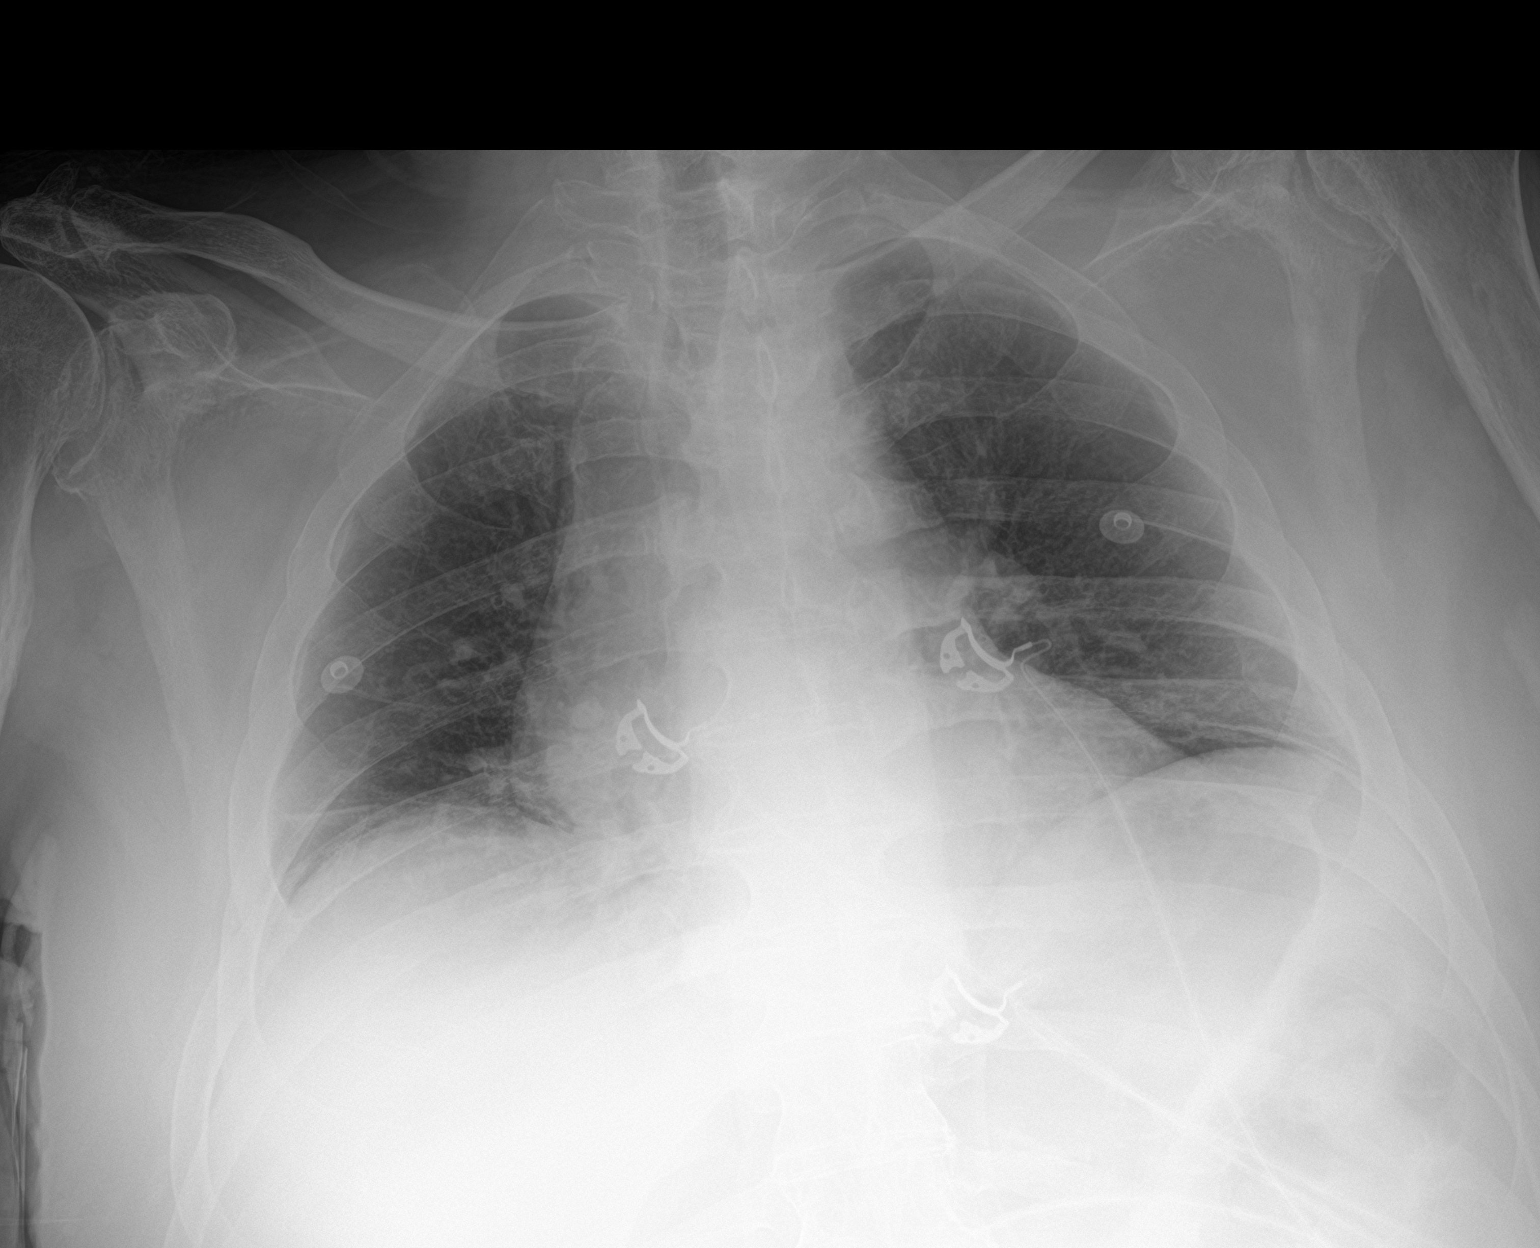

[2 of 2 positions shown; findings below may reference images not displayed]

FINDINGS: The heart is borderline enlarged, unchanged. The mediastinal
contours are stable.

Lung volumes are low. Linear opacities in the lung bases likely
reflect subsegmental atelectasis. There is no focal consolidation or
pulmonary edema. There is no pleural effusion or pneumothorax.

There is multilevel degenerative change throughout the imaged spine.
There is no acute osseous abnormality.
IMPRESSION: Borderline cardiomegaly and low lung volumes with bibasilar
atelectasis.

## 2021-10-07 MED ORDER — METRONIDAZOLE 500 MG/100ML IV SOLN
500.0000 mg | Freq: Once | INTRAVENOUS | Status: AC
  Administered 2021-10-07: 500 mg via INTRAVENOUS
  Filled 2021-10-07: qty 100

## 2021-10-07 MED ORDER — SODIUM CHLORIDE 0.9 % IV SOLN
2.0000 g | Freq: Two times a day (BID) | INTRAVENOUS | Status: DC
  Administered 2021-10-08: 2 g via INTRAVENOUS
  Filled 2021-10-07 (×2): qty 2

## 2021-10-07 MED ORDER — SODIUM CHLORIDE 0.9 % IV BOLUS (SEPSIS)
1000.0000 mL | Freq: Once | INTRAVENOUS | Status: AC
  Administered 2021-10-07: 1000 mL via INTRAVENOUS

## 2021-10-07 MED ORDER — SERTRALINE HCL 50 MG PO TABS
50.0000 mg | ORAL_TABLET | Freq: Every day | ORAL | Status: DC
  Administered 2021-10-08 – 2021-10-09 (×2): 50 mg via ORAL
  Filled 2021-10-07 (×2): qty 1

## 2021-10-07 MED ORDER — ACETAMINOPHEN 650 MG RE SUPP: 650.0000 mg | Freq: Four times a day (QID) | RECTAL | Status: DC | PRN

## 2021-10-07 MED ORDER — SODIUM CHLORIDE 0.9 % IV BOLUS (SEPSIS)
400.0000 mL | Freq: Once | INTRAVENOUS | Status: AC
  Administered 2021-10-07: 400 mL via INTRAVENOUS

## 2021-10-07 MED ORDER — VANCOMYCIN VARIABLE DOSE PER UNSTABLE RENAL FUNCTION (PHARMACIST DOSING): Status: DC

## 2021-10-07 MED ORDER — METOPROLOL TARTRATE 5 MG/5ML IV SOLN: 5.0000 mg | Freq: Four times a day (QID) | INTRAVENOUS | Status: DC | PRN

## 2021-10-07 MED ORDER — ACETAMINOPHEN 325 MG PO TABS: 650.0000 mg | ORAL_TABLET | Freq: Four times a day (QID) | ORAL | Status: DC | PRN

## 2021-10-07 MED ORDER — ONDANSETRON HCL 4 MG/2ML IJ SOLN: 4.0000 mg | Freq: Four times a day (QID) | INTRAMUSCULAR | Status: DC | PRN

## 2021-10-07 MED ORDER — GABAPENTIN 300 MG PO CAPS
300.0000 mg | ORAL_CAPSULE | Freq: Every day | ORAL | Status: DC
  Administered 2021-10-07: 300 mg via ORAL
  Filled 2021-10-07: qty 1

## 2021-10-07 MED ORDER — MORPHINE SULFATE (PF) 2 MG/ML IV SOLN: 2.0000 mg | INTRAVENOUS | Status: DC | PRN

## 2021-10-07 MED ORDER — BISACODYL 5 MG PO TBEC: 5.0000 mg | DELAYED_RELEASE_TABLET | Freq: Every day | ORAL | Status: DC | PRN

## 2021-10-07 MED ORDER — DIVALPROEX SODIUM 125 MG PO DR TAB
125.0000 mg | DELAYED_RELEASE_TABLET | Freq: Two times a day (BID) | ORAL | Status: DC
  Administered 2021-10-07 – 2021-10-09 (×4): 125 mg via ORAL
  Filled 2021-10-07 (×5): qty 1

## 2021-10-07 MED ORDER — TAMSULOSIN HCL 0.4 MG PO CAPS
0.4000 mg | ORAL_CAPSULE | Freq: Every day | ORAL | Status: DC
  Administered 2021-10-08 – 2021-10-09 (×2): 0.4 mg via ORAL
  Filled 2021-10-07 (×2): qty 1

## 2021-10-07 MED ORDER — HYDROCODONE-ACETAMINOPHEN 5-325 MG PO TABS
1.0000 | ORAL_TABLET | ORAL | Status: DC | PRN
  Administered 2021-10-08: 1 via ORAL
  Filled 2021-10-07: qty 1

## 2021-10-07 MED ORDER — FINASTERIDE 5 MG PO TABS
5.0000 mg | ORAL_TABLET | Freq: Every day | ORAL | Status: DC
  Administered 2021-10-08 – 2021-10-09 (×2): 5 mg via ORAL
  Filled 2021-10-07 (×2): qty 1

## 2021-10-07 MED ORDER — DUTASTERIDE 0.5 MG PO CAPS: 0.5000 mg | ORAL_CAPSULE | Freq: Every day | ORAL | Status: DC

## 2021-10-07 MED ORDER — LACTATED RINGERS IV SOLN: INTRAVENOUS | Status: AC

## 2021-10-07 MED ORDER — INSULIN ASPART 100 UNIT/ML IJ SOLN
0.0000 [IU] | INTRAMUSCULAR | Status: DC
Start: 2021-10-07 — End: 2021-10-08
  Administered 2021-10-08: 2 [IU] via SUBCUTANEOUS
  Filled 2021-10-07: qty 1

## 2021-10-07 MED ORDER — SENNOSIDES-DOCUSATE SODIUM 8.6-50 MG PO TABS: 1.0000 | ORAL_TABLET | Freq: Every evening | ORAL | Status: DC | PRN

## 2021-10-07 MED ORDER — VANCOMYCIN HCL IN DEXTROSE 1-5 GM/200ML-% IV SOLN
1000.0000 mg | Freq: Once | INTRAVENOUS | Status: AC
  Administered 2021-10-07: 1000 mg via INTRAVENOUS
  Filled 2021-10-07: qty 200

## 2021-10-07 MED ORDER — ONDANSETRON HCL 4 MG PO TABS: 4.0000 mg | ORAL_TABLET | Freq: Four times a day (QID) | ORAL | Status: DC | PRN

## 2021-10-07 MED ORDER — DONEPEZIL HCL 5 MG PO TABS
10.0000 mg | ORAL_TABLET | Freq: Every day | ORAL | Status: DC
  Administered 2021-10-07 – 2021-10-08 (×2): 10 mg via ORAL
  Filled 2021-10-07 (×2): qty 2

## 2021-10-07 MED ORDER — SODIUM CHLORIDE 0.9 % IV SOLN
2.0000 g | Freq: Once | INTRAVENOUS | Status: AC
  Administered 2021-10-07: 2 g via INTRAVENOUS
  Filled 2021-10-07: qty 2

## 2021-10-07 MED ORDER — MEMANTINE HCL 5 MG PO TABS
10.0000 mg | ORAL_TABLET | Freq: Two times a day (BID) | ORAL | Status: DC
  Administered 2021-10-07 – 2021-10-09 (×4): 10 mg via ORAL
  Filled 2021-10-07 (×4): qty 2

## 2021-10-07 MED ORDER — RISPERIDONE 0.5 MG PO TABS
0.5000 mg | ORAL_TABLET | Freq: Every day | ORAL | Status: DC
  Administered 2021-10-07 – 2021-10-08 (×2): 0.5 mg via ORAL
  Filled 2021-10-07 (×3): qty 1

## 2021-10-07 NOTE — ED Provider Notes (Signed)
Emergency Medicine Provider Triage Evaluation Note  ARMARION GREEK , a 80 y.o. male  was evaluated in triage.  Pt sent to the ER for possible UTI.  Review of Systems  Positive: No complaints Negative: Pain  Physical Exam  There were no vitals taken for this visit. Gen:   Awake, no distress   Resp:  Normal effort  MSK:   Moves extremities without difficulty  Other:    Medical Decision Making  Medically screening exam initiated at 2:48 PM.  Appropriate orders placed.  APOLLOS TENBRINK was informed that the remainder of the evaluation will be completed by another provider, this initial triage assessment does not replace that evaluation, and the importance of remaining in the ED until their evaluation is complete.    Victorino Dike, FNP 10/07/21 1450    Rada Hay, MD 10/07/21 1810

## 2021-10-07 NOTE — ED Notes (Signed)
Pharmacy contacted for flagyl

## 2021-10-07 NOTE — ED Notes (Signed)
Foley attempted x2 with Dr Kerman Passey

## 2021-10-07 NOTE — ED Notes (Signed)
Pt pass swallow evaluation
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
## 2021-10-07 NOTE — Sepsis Progress Note (Addendum)
Secure chat with provider regarding two maps less than 65 and need for additional fluids.  Provider responded that fluids ordered were based off of IBW.

## 2021-10-07 NOTE — ED Notes (Signed)
Bladder scan 238ml

## 2021-10-07 NOTE — Consult Note (Addendum)
PHARMACY -  BRIEF ANTIBIOTIC NOTE   Pharmacy has received consult(s) for cefepime and vancomycin from an ED provider.  The patient's profile has been reviewed for ht/wt/allergies/indication/available labs.    One time order(s) placed for cefepime 2g and vancomycin 1g  Further antibiotics/pharmacy consults should be ordered by admitting physician if indicated.                       Thank you, Narda Rutherford, PharmD Pharmacy Resident  10/07/2021 3:41 PM

## 2021-10-07 NOTE — ED Notes (Signed)
Bladder scan 301

## 2021-10-07 NOTE — ED Notes (Signed)
See triage note, pt wife at bedside reports hx dementia, pt has been acting more aggressive than usual which happens when has UTI, reports decreased urine output. Started pt on antibiotics that she has at home.  Pt altered.

## 2021-10-07 NOTE — ED Triage Notes (Signed)
Arrives via [facility].. hx recurrent UTI.

## 2021-10-07 NOTE — H&P (Signed)
History and Physical    [name]:[id number] DOB: 10-May-1941 DOA: 10/07/2021  PCP: Birdie Sons, MD  Urology Team: [facility] Patient coming from: Home  I have personally briefly reviewed patient's old medical records in [facility].  Chief Complaint: altered mental status, low blood pressure  HPI: Norman Palmer is a 80 y.o. 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 PT came and noted he had not urinated in over 12 hours. BP in both arms by PT was 80/60 in both arms; was told to go to the ED. He is completely bedbound from chronic debility and back pain. He does have a small bed sore x 1 month. He has been eating without difficulty. No known dysuria or hematuria. Chronically, whenever he is moved he complains of pain; otherwise he has no pain. He occasionally chokes on his food chronically. In the 3 days before admission, he has been more somnolent that usual. He is not more agitated than usual. At baseline just oriented to self. He becomes intermittently agitated chronically. Symptoms are alleviated by nothing and exacerbated by nothing. Associated symptoms: No known abdominal pain, vomiting, diarrhea, bloody stool, dysuria, hematuria, shortness of breath, wheezing. Mild cough with eating at times. During the last 4 weeks prior to admission, his right arm and right leg are weaker than baseline. He did have a urine sample on 10/03/21 but no results yet. He had increasing agitation and wife was concerned for UTI, so on 10/04/21 she started him on leftover Keflex 500 mg q am and then 250 mg qhs. History is provided by his wife at bedside because patient cannot offer information due to his dementia.   ED Course: Labs include lactic acid 3.3, potassium 5.5, creatinine 1.93, WBCs  11.3, hemoglobin 11.4.  Patient did not meet SIRS criteria.  Patient had no urine output; staff was unable to obtain urine by in and out cath.  Blood and urine cultures were ordered.  Patient was given multiple boluses of IV fluids including 30 mL/kg based on ideal body weight.  He was started empirically on IV antibiotics including IV cefepime, IV metronidazole, and IV vancomycin.  Urologist was consulted.  Review of Systems: As per HPI otherwise all other systems reviewed and are unremarable. NEUROLOGICAL: No headache or new focal weakness.    Past Medical History:  Diagnosis Date   Alzheimer disease ([place])    Dementia ([place])    Diabetes mellitus without complication ([place])    H/O knee surgery 04/17/2015   Overview:  Bilateral knees surgery    History of basal cell carcinoma (BCC) of skin 08/12/2019   MOHS Right alar crease [facility] Dr. Manley Mason 07/2019   Hypospadias    and strictures   Infection with methicillin-resistant Staphylococcus aureus 08/22/2015   Insomnia    Lumbago    Sciatica    Thyroid disease     Past Surgical History:  Procedure Laterality Date   knee surgery Bilateral 2000   MOHS SURGERY Right 08/11/2019   BCC alar crease- [facility] Dr. Manley Mason   SKIN GRAFT Bilateral 1955   legs   [place] History  reports that he has quit smoking. His smoking use included cigarettes. He has a 60.00 pack-year smoking history. He has quit using smokeless tobacco. He reports current alcohol use of about 1.0 -  2.0 standard drink per week. He reports that he does not use drugs.  Allergies  Allergen Reactions   Doxycycline Rash   Zinc Rash    Family History  Problem Relation Age of Onset   Hypertension Father    Cancer Brother        Liver Cancer     Home Medications  Prior to Admission medications   Medication Sig Start Date End Date Taking? Authorizing Provider  celecoxib (CELEBREX) 100 MG capsule Take 1 capsule (100 mg total) by mouth daily. For  pain 08/06/21  Yes Birdie Sons, MD  cephALEXin (KEFLEX) 250 MG capsule TAKE 1 CAPSULE BY MOUTH EVERY DAY FOR UTI PREVENTION 09/25/21  Yes Birdie Sons, MD  divalproex (DEPAKOTE) 125 MG DR tablet Take 1 tablet by mouth in the morning and at bedtime. 06/12/21  Yes [provider]  donepezil (ARICEPT) 10 MG tablet Take 10 mg by mouth at bedtime.    Yes [provider]  dutasteride (AVODART) 0.5 MG capsule Take 1 capsule by mouth at bedtime. 02/07/21  Yes [provider]  finasteride (PROSCAR) 5 MG tablet Take 1 tablet (5 mg total) by mouth daily. 06/11/21  Yes Vaillancourt, Samantha, PA-C  gabapentin (NEURONTIN) 300 MG capsule Take 1 capsule (300 mg total) by mouth 2 (two) times daily. 09/20/21  Yes Birdie Sons, MD  insulin NPH Human (NOVOLIN N) 100 UNIT/ML injection Inject 22 Units into the skin daily. 11/09/20 11/09/21 Yes [provider]  lisinopril (ZESTRIL) 20 MG tablet TAKE 1 TABLET BY MOUTH DAILY 09/01/21  Yes Birdie Sons, MD  memantine (NAMENDA) 10 MG tablet Take 1 tablet (10 mg total) by mouth 2 (two) times daily. 09/23/21  Yes Birdie Sons, MD  metFORMIN (GLUCOPHAGE) 1000 MG tablet TAKE ONE TABLET TWICE DAILY WITH MEALS Patient taking differently: Take 1,000 mg by mouth 2 (two) times daily with a meal. 07/11/19  Yes Birdie Sons, MD  risperiDONE (RISPERDAL) 0.5 MG tablet TAKE 1 TABLET BY MOUTH AT BEDTIME 08/29/21  Yes Birdie Sons, MD  sertraline (ZOLOFT) 50 MG tablet Take 50 mg by mouth daily. 11/03/19  Yes [provider]  silodosin (RAPAFLO) 8 MG CAPS capsule Take 1 capsule (8 mg total) by mouth daily with breakfast. 06/11/21  Yes Vaillancourt, Samantha, PA-C  silver sulfADIAZINE (SILVADENE) 1 % cream Apply 1 application topically daily. 09/11/21  Yes Birdie Sons, MD  traMADol Veatrice Bourbon) 50 MG tablet TAKE ONE TABLET EVERY 8 HOURS AS NEEDED 09/09/21  Yes Birdie Sons, MD  triamterene-hydrochlorothiazide (DYAZIDE) 37.5-25  MG capsule TAKE 1 CAPSULE BY MOUTH EACH MORNING 06/18/21  Yes Birdie Sons, MD  zinc oxide 20 % ointment Apply 1 application topically as needed for irritation. Apply to affected area daily 05/14/21  Yes Fisher, Kirstie Peri, MD  ALPRAZolam Duanne Moron) 1 MG tablet TAKE 1 TABLET BY MOUTH THREE TIMES DAILY AS NEEDED Patient not taking: No sig reported 06/04/21   Birdie Sons, MD  Vitamin D, Ergocalciferol, (DRISDOL) 1.25 MG (50000 UNIT) CAPS capsule TAKE 1 CAPSULE BY MOUTH ONCE A WEEK Patient taking differently: wednesday 08/29/21   Birdie Sons, MD    Physical Exam: Vitals:   10/07/21 1700 10/07/21 1730 10/07/21 1800 10/07/21 1830  BP: (!) 106/47 (!) 103/52 (!) 125/59 (!) 150/72  Pulse: 74 73 79 81  Resp: 15 12 13 12   Temp:      TempSrc:      SpO2: 99% 100% 93% 99%  Weight:      Height:        Constitutional: NAD, calm, comfortable, ill-appearing. Vitals:   10/07/21 1700 10/07/21 1730 10/07/21 1800 10/07/21 1830  BP: (!) 106/47 (!) 103/52 (!) 125/59 (!) 150/72  Pulse: 74 73 79 81  Resp: 15 12 13 12   Temp:      TempSrc:      SpO2: 99% 100% 93% 99%  Weight:      Height:       Eyes: Pupils equal and round, lids and conjunctivae without icterus or erythema. ENMT: Mucous membranes are dry. Posterior pharynx clear of any exudate or lesions. Nares patent without discharge or bleeding.  Normocephalic, atraumatic.   Neck: normal, supple, no masses, trachea midline.  Thyroid nontender, no masses appreciated, no thyromegaly. Large neck circumference.  Respiratory: clear to auscultation bilaterally. Chest wall movements are symmetric. No wheezing, no crackles.  No rhonchi.  Normal respiratory effort. No accessory muscle use. Decreased breath sounds in the bases bilaterally. Cardiovascular: Regular rate and rhythm, no murmurs / rubs / gallops. Pulses: Radial, DP PT pulses 2+ bilaterally. No carotid bruits.  Capillary refill less than 3 seconds. Edema: trace bilaterally. GI: soft,  non-distended, normal active bowel sounds. No hepatosplenomegaly. No rigidity, rebound, or guarding. Non-tender. No masses palpated. No CVA tenderness bilaterally.  Musculoskeletal: no clubbing / cyanosis. No joint deformity upper and lower extremities. Good ROM, no contractures. Normal muscle tone.  No tenderness or deformity in the back bilaterally. Integument: no rashes. No induration. Clean, dry. Decubitus ulcer, open wound with odor.  Neurologic: CN 2-12 grossly intact, except left facial droop (unknown if chronic). Sensation grossly intact to light touch. DTR 2+ bilaterally.  Babinski: Toes nonreactive bilaterally.  Strength 4/5 in upper extremities. Strength 1/5 in right lower extremity and 2/5 in left lower extremity (chronic).  Intact rapid alternating movements bilaterally.  No pronator drift. Psychiatric: Poor judgment and insight. Alert and oriented to person only. Intermittently agitated affect; occasionally shouts an obscenity.   Lymphatic: No cervical lymphadenopathy. No supraclavicular lymphadenopathy.   Labs on Admission: I have personally reviewed the following labs and imaging studies.  CBC: Recent Labs  Lab 10/07/21 1456  WBC 11.3*  NEUTROABS 7.0  HGB 11.4*  HCT 35.0*  MCV 95.4  PLT 676    Basic Metabolic Panel: Recent Labs  Lab 10/07/21 1456  NA 134*  K 5.5*  CL 99  CO2 22  GLUCOSE 95  BUN 68*  CREATININE 1.93*  CALCIUM 9.2    GFR: Estimated Creatinine Clearance: 44.6 mL/min (A) (by C-G formula based on SCr of 1.93 mg/dL (H)).  Liver Function Tests: Recent Labs  Lab 10/07/21 1456  AST 23  ALT 14  ALKPHOS 53  BILITOT 0.3  PROT 6.7  ALBUMIN 3.1*    Urine analysis: Still awaiting current sample.     Component Value Date/Time   COLORURINE YELLOW (A) 03/17/2021 1942   APPEARANCEUR Clear 06/11/2021 1101   LABSPEC 1.015 03/17/2021 1942   PHURINE 5.0 03/17/2021 1942   GLUCOSEU Negative 06/11/2021 1101   HGBUR NEGATIVE 03/17/2021 1942    BILIRUBINUR Negative 06/11/2021 1101   KETONESUR NEGATIVE 03/17/2021 1942   PROTEINUR Negative 06/11/2021 1101   PROTEINUR NEGATIVE 03/17/2021 1942   UROBILINOGEN 0.2 10/13/2017 1126   NITRITE Negative 06/11/2021 1101   NITRITE POSITIVE (A) 03/17/2021 1942   LEUKOCYTESUR Negative 06/11/2021 1101   LEUKOCYTESUR NEGATIVE 03/17/2021 1942    Radiological Exams on Admission: DG Chest 2 View  Result Date: 10/07/2021 CLINICAL  DATA:  Possible sepsis EXAM: CHEST - 2 VIEW COMPARISON:  Chest radiograph 03/25/2021 FINDINGS: The heart is borderline enlarged, unchanged. The mediastinal contours are stable. Lung volumes are low. Linear opacities in the lung bases likely reflect subsegmental atelectasis. There is no focal consolidation or pulmonary edema. There is no pleural effusion or pneumothorax. There is multilevel degenerative change throughout the imaged spine. There is no acute osseous abnormality. IMPRESSION: Borderline cardiomegaly and low lung volumes with bibasilar atelectasis. Electronically Signed   By: Valetta Mole M.D.   On: 10/07/2021 16:07    EKG: Independently reviewed.  75 bpm.  Normal sinus rhythm.    Assessment/Plan  Principal Problem:   Acute renal failure superimposed on stage 3 chronic kidney disease (HCC) Baseline creatinine 1.3 and admission creatinine 1.9.  Suspect is due to dehydration and urinary retention.  No urine output in greater than 12 hours prior to presentation. Plan: Check urine labs.  Give IV fluid bolus then maintenance IV fluids.  Avoid nephrotoxins.  Renally dose medications.   Active Problems:    Acute metabolic encephalopathy Baseline dementia but more agitated and more somnolent.  May be due to infection, dehydration, or other cause.  Plan: Neurochecks.  IV fluids.  IV antibiotics for possible infection.    Hyperkalemia May be related to dehydration with acute kidney injury.  Plan: Give IV fluids.  Recheck potassium.  If still elevated then start  [place] and give IV insulin and dextrose.    Acute cystitis with hematuria Suspected diagnosis.  Update: Urinalysis consistent with urinary tract infection. Plan: Cultures ordered.  Empiric IV cefepime and vancomycin ordered due to concern that he could have an infection from another source.  Consider narrowing antibiotics.    Lactic acidosis Cause unknown.  Likely related to dehydration with acute renal failure.  Patient does not meet SIRS criteria upon admission.  Plan: IV bolus then maintenance fluids. Recheck lactic acid levels.    Type 2 diabetes mellitus with diabetic neuropathy, with long-term current use of insulin ([place]) Plan: Hold oral diabetes medications.  Initially will hold home insulin also because patient is n.p.o. and glucose is not elevated upon admission. Check fingerstick blood sugars.  Sliding scale insulin.   Lower dose of gabapentin due to acute renal failure.     BPH with obstruction/lower urinary tract symptoms and    Stricture, Urethral NOS Appreciate urology consult.  Discussed case with urologist at bedside. Plan: N.p.o. after midnight for planned suprapubic catheter insertion on 10/08/2021.    Pressure injury of buttock, stage 3 (HCC) Has been present for 1 month.  Does have a foul odor. Plan: Wound care team consult for evaluation and treatment.    Dementia, Alzheimer's type, late onset, with behavioral disturbance Plan: Continue home medications.    Functional paraparesis Wife reports patient is bedbound at baseline.  He is able to move his feet and legs very minimally with left leg slightly better than the right.  He does work with physical therapy at home to try to improve. Plan: Patient will require additional nurse and nurse assistant care due to his bedbound status.     DVT prophylaxis: SCDs.  Code Status:   DNR Discussed code status options at length with the wife, who states patient is DNR. Fully explained code status to family. Answered all  questions.   Family Communication:  Patient's wife at bedside Jabori Henegar    Disposition Plan:   Patient is from:  Home  Anticipated DC to:  Home  Anticipated  DC date:  10/12/21  Anticipated DC barriers: Need for IV fluids, infection, other  Consults called:  Urology, Dr. Jeffie Pollock  Admission status:  Inpatient   Severity of Illness: The appropriate patient status for this patient is INPATIENT. Inpatient status is judged to be reasonable and necessary in order to provide the required intensity of service to ensure the patient's safety. The patient's presenting symptoms, physical exam findings, and initial radiographic and laboratory data in the context of their chronic comorbidities is felt to place them at high risk for further clinical deterioration. Furthermore, it is not anticipated that the patient will be medically stable for discharge from the hospital within 2 midnights of admission. Findings: Acute renal failure, no urine output for greater than 12 hours, urethral stricture, urinary tract infection, acute metabolic encephalopathy, hyperkalemia.  * I certify that at the point of admission it is my clinical judgment that the patient will require inpatient hospital care spanning beyond 2 midnights from the point of admission due to high intensity of service, high risk for further deterioration and high frequency of surveillance required.Tacey Ruiz MD [facility]  How to contact the [facility] Attending or Consulting provider [place] or covering provider during after hours [place], for this patient?   Check the care team in [facility] and look for a) attending/consulting [facility] provider listed and b) the [facility] team listed Log into [email or web address] and use [place]'s universal password to access. If you do not have the password, please contact the hospital operator. Locate the [facility] provider you are looking for under [facility] and page to a number that you can be directly reached. If  you still have difficulty reaching the provider, please page the [facility] (Director on Call) for the Hospitalists listed on amion for assistance.  10/07/2021, 6:48 PM

## 2021-10-07 NOTE — Consult Note (Signed)
Subjective: No diagnosis found.   Consult requested by Dr. Harvest Dark.  I was asked to see [name] for a difficult foley placement.  He is a 80 yo demented male who is a patient at [facility] for a history of recurrent bulbourethral stricture disease.  He has had at least 2 prior dilations in the past at [facility] and prior prostatic surgery with traumatic foley removal.  He was last seen at [facility] on 06/11/21 and his BPH meds were changed to finasteride and silodosin since they are crushable.   He is in the ER tonight with sepsis and an attempt at foley placement was unsuccessful.   He was anuria for several hours but has voided since he got IV fluids and he is currently hemodynamically stable.  His PVR is about 350-438ml which is stable since his last visit in June.  He has no complaints of pain.   At home he voids incontinently into pads and has had issues with decubiti as he is bed bound.  ROS:  Review of Systems  Unable to perform ROS: Dementia   Allergies  Allergen Reactions   Doxycycline Rash   Zinc Rash    Past Medical History:  Diagnosis Date   Alzheimer disease ([place])    Dementia ([place])    Diabetes mellitus without complication ([place])    H/O knee surgery 04/17/2015   Overview:  Bilateral knees surgery    History of basal cell carcinoma (BCC) of skin 08/12/2019   MOHS Right alar crease [facility] Dr. Manley Mason 07/2019   Hypospadias    and strictures   Infection with methicillin-resistant Staphylococcus aureus 08/22/2015   Insomnia    Lumbago    Sciatica    Thyroid disease     Past Surgical History:  Procedure Laterality Date   knee surgery Bilateral 2000   MOHS SURGERY Right 08/11/2019   BCC alar crease- [facility] Dr. Manley Mason   SKIN GRAFT Bilateral 1955   legs   URETHRAL STRICTURE DILATATION      Social History   Socioeconomic History   Marital status: Married    Spouse name: Not on file   Number of children: 3   Years of education: MBA   Highest education level:  Master's degree (e.g., MA, MS, MEng, MEd, MSW, MBA)  Occupational History   Occupation: retired  Tobacco Use   Smoking status: Former    Packs/day: 2.00    Years: 30.00    Pack years: 60.00    Types: Cigarettes   Smokeless tobacco: Former   Tobacco comments:    quit over 20 years ago  Vaping Use   Vaping Use: Never used  Substance and Sexual Activity   Alcohol use: Yes    Alcohol/week: 1.0 - 2.0 standard drink    Types: 1 - 2 Cans of beer per week   Drug use: No   Sexual activity: Not on file  Other Topics Concern   Not on file  Social History Narrative   Not on file   Social Determinants of Health   Financial Resource Strain: Not on file  Food Insecurity: Not on file  Transportation Needs: Not on file  Physical Activity: Not on file  Stress: Not on file  Social Connections: Not on file  Intimate Partner Violence: Not on file    Family History  Problem Relation Age of Onset   Hypertension Father    Cancer Brother        Liver Cancer    Anti-infectives: Anti-infectives (From  admission, onward)    Start     Dose/Rate Route Frequency Ordered Stop   10/07/21 1545  ceFEPIme (MAXIPIME) 2 g in sodium chloride 0.9 % 100 mL IVPB        2 g 200 mL/hr over 30 Minutes Intravenous  Once 10/07/21 1530 10/07/21 1658   10/07/21 1545  metroNIDAZOLE (FLAGYL) IVPB 500 mg        500 mg 100 mL/hr over 60 Minutes Intravenous  Once 10/07/21 1530 10/07/21 1740   10/07/21 1545  vancomycin (VANCOCIN) IVPB 1000 mg/200 mL premix        1,000 mg 200 mL/hr over 60 Minutes Intravenous  Once 10/07/21 1530 10/07/21 1828       Current Facility-Administered Medications  Medication Dose Route Frequency Provider Last Rate Last Admin   lactated ringers infusion   Intravenous Continuous Harvest Dark, MD       Current Outpatient Medications  Medication Sig Dispense Refill   celecoxib (CELEBREX) 100 MG capsule Take 1 capsule (100 mg total) by mouth daily. For pain 30 capsule 3    cephALEXin (KEFLEX) 250 MG capsule TAKE 1 CAPSULE BY MOUTH EVERY DAY FOR UTI PREVENTION 30 capsule 5   divalproex (DEPAKOTE) 125 MG DR tablet Take 1 tablet by mouth in the morning and at bedtime.     donepezil (ARICEPT) 10 MG tablet Take 10 mg by mouth at bedtime.   2   dutasteride (AVODART) 0.5 MG capsule Take 1 capsule by mouth at bedtime.     finasteride (PROSCAR) 5 MG tablet Take 1 tablet (5 mg total) by mouth daily. 30 tablet 11   gabapentin (NEURONTIN) 300 MG capsule Take 1 capsule (300 mg total) by mouth 2 (two) times daily. 60 capsule 5   insulin NPH Human (NOVOLIN N) 100 UNIT/ML injection Inject 22 Units into the skin daily.     lisinopril (ZESTRIL) 20 MG tablet TAKE 1 TABLET BY MOUTH DAILY 90 tablet 0   memantine (NAMENDA) 10 MG tablet Take 1 tablet (10 mg total) by mouth 2 (two) times daily. 60 tablet 12   metFORMIN (GLUCOPHAGE) 1000 MG tablet TAKE ONE TABLET TWICE DAILY WITH MEALS (Patient taking differently: Take 1,000 mg by mouth 2 (two) times daily with a meal.) 180 tablet 5   risperiDONE (RISPERDAL) 0.5 MG tablet TAKE 1 TABLET BY MOUTH AT BEDTIME 30 tablet 12   sertraline (ZOLOFT) 50 MG tablet Take 50 mg by mouth daily.     silodosin (RAPAFLO) 8 MG CAPS capsule Take 1 capsule (8 mg total) by mouth daily with breakfast. 30 capsule 11   silver sulfADIAZINE (SILVADENE) 1 % cream Apply 1 application topically daily. 500 g 1   traMADol (ULTRAM) 50 MG tablet TAKE ONE TABLET EVERY 8 HOURS AS NEEDED 60 tablet 4   triamterene-hydrochlorothiazide (DYAZIDE) 37.5-25 MG capsule TAKE 1 CAPSULE BY MOUTH EACH MORNING 90 capsule 3   zinc oxide 20 % ointment Apply 1 application topically as needed for irritation. Apply to affected area daily 85 g 4   ALPRAZolam (XANAX) 1 MG tablet TAKE 1 TABLET BY MOUTH THREE TIMES DAILY AS NEEDED (Patient not taking: No sig reported) 20 tablet 5   Vitamin D, Ergocalciferol, (DRISDOL) 1.25 MG (50000 UNIT) CAPS capsule TAKE 1 CAPSULE BY MOUTH ONCE A WEEK (Patient  taking differently: wednesday) 12 capsule 4     Objective: Vital signs in last 24 hours: BP (!) 150/72   Pulse 81   Temp 97.6 F (36.4 C) (Oral)   Resp 12  Ht 6' (1.829 m)   Wt 129.7 kg   SpO2 99%   BMI 38.78 kg/m   Intake/Output from previous day: No intake/output data recorded. Intake/Output this shift: No intake/output data recorded.   Physical Exam Vitals reviewed.  Constitutional:      Appearance: Normal appearance. He is obese.  Cardiovascular:     Rate and Rhythm: Normal rate and regular rhythm.  Pulmonary:     Effort: Pulmonary effort is normal.     Breath sounds: Normal breath sounds.  Abdominal:     Palpations: Abdomen is soft. There is no mass.     Tenderness: There is no abdominal tenderness.  Genitourinary:    Comments: Urine collection bag in place.  Musculoskeletal:        General: Normal range of motion.  Skin:    General: Skin is warm and dry.  Neurological:     Mental Status: He is disoriented.  Psychiatric:     Comments: agitated    Lab Results:  Results for orders placed or performed during the hospital encounter of 10/07/21 (from the past 24 hour(s))  Comprehensive metabolic panel     Status: Abnormal   Collection Time: 10/07/21  2:56 PM  Result Value Ref Range   Sodium 134 (L) 135 - 145 mmol/L   Potassium 5.5 (H) 3.5 - 5.1 mmol/L   Chloride 99 98 - 111 mmol/L   CO2 22 22 - 32 mmol/L   Glucose, Bld 95 70 - 99 mg/dL   BUN 68 (H) 8 - 23 mg/dL   Creatinine, Ser 1.93 (H) 0.61 - 1.24 mg/dL   Calcium 9.2 8.9 - 10.3 mg/dL   Total Protein 6.7 6.5 - 8.1 g/dL   Albumin 3.1 (L) 3.5 - 5.0 g/dL   AST 23 15 - 41 U/L   ALT 14 0 - 44 U/L   Alkaline Phosphatase 53 38 - 126 U/L   Total Bilirubin 0.3 0.3 - 1.2 mg/dL   GFR, Estimated 35 (L) >60 mL/min   Anion gap 13 5 - 15  CBC with Differential     Status: Abnormal   Collection Time: 10/07/21  2:56 PM  Result Value Ref Range   WBC 11.3 (H) 4.0 - 10.5 K/uL   RBC 3.67 (L) 4.22 - 5.81 MIL/uL    Hemoglobin 11.4 (L) 13.0 - 17.0 g/dL   HCT 35.0 (L) 39.0 - 52.0 %   MCV 95.4 80.0 - 100.0 fL   MCH 31.1 26.0 - 34.0 pg   MCHC 32.6 30.0 - 36.0 g/dL   RDW 13.2 11.5 - 15.5 %   Platelets 339 150 - 400 K/uL   nRBC 0.0 0.0 - 0.2 %   Neutrophils Relative % 62 %   Neutro Abs 7.0 1.7 - 7.7 K/uL   Lymphocytes Relative 25 %   Lymphs Abs 2.8 0.7 - 4.0 K/uL   Monocytes Relative 9 %   Monocytes Absolute 1.0 0.1 - 1.0 K/uL   Eosinophils Relative 2 %   Eosinophils Absolute 0.2 0.0 - 0.5 K/uL   Basophils Relative 1 %   Basophils Absolute 0.1 0.0 - 0.1 K/uL   Immature Granulocytes 1 %   Abs Immature Granulocytes 0.15 (H) 0.00 - 0.07 K/uL  Lactic acid, plasma     Status: Abnormal   Collection Time: 10/07/21  2:57 PM  Result Value Ref Range   Lactic Acid, Venous 3.3 (HH) 0.5 - 1.9 mmol/L  Protime-INR     Status: None   Collection Time: 10/07/21  2:57 PM  Result Value Ref Range   Prothrombin Time 13.1 11.4 - 15.2 seconds   INR 1.0 0.8 - 1.2  APTT     Status: None   Collection Time: 10/07/21  2:57 PM  Result Value Ref Range   aPTT 30 24 - 36 seconds  Urinalysis, Complete w Microscopic     Status: Abnormal   Collection Time: 10/07/21  4:20 PM  Result Value Ref Range   Color, Urine YELLOW (A) YELLOW   APPearance CLOUDY (A) CLEAR   Specific Gravity, Urine 1.011 1.005 - 1.030   pH 5.0 5.0 - 8.0   Glucose, UA NEGATIVE NEGATIVE mg/dL   Hgb urine dipstick SMALL (A) NEGATIVE   Bilirubin Urine NEGATIVE NEGATIVE   Ketones, ur NEGATIVE NEGATIVE mg/dL   Protein, ur 30 (A) NEGATIVE mg/dL   Nitrite NEGATIVE NEGATIVE   Leukocytes,Ua LARGE (A) NEGATIVE   RBC / HPF 6-10 0 - 5 RBC/hpf   WBC, UA >50 (H) 0 - 5 WBC/hpf   Bacteria, UA RARE (A) NONE SEEN   Squamous Epithelial / LPF 0-5 0 - 5   WBC Clumps PRESENT   Resp Panel by RT-PCR (Flu A&B, Covid) Nasopharyngeal Swab     Status: None   Collection Time: 10/07/21  4:20 PM   Specimen: Nasopharyngeal Swab; Nasopharyngeal(NP) swabs in vial transport medium   Result Value Ref Range   SARS Coronavirus 2 by RT PCR NEGATIVE NEGATIVE   Influenza A by PCR NEGATIVE NEGATIVE   Influenza B by PCR NEGATIVE NEGATIVE  Lactic acid, plasma     Status: Abnormal   Collection Time: 10/07/21  5:35 PM  Result Value Ref Range   Lactic Acid, Venous 2.0 (HH) 0.5 - 1.9 mmol/L    BMET Recent Labs    10/07/21 1456  NA 134*  K 5.5*  CL 99  CO2 22  GLUCOSE 95  BUN 68*  CREATININE 1.93*  CALCIUM 9.2   PT/INR Recent Labs    10/07/21 1457  LABPROT 13.1  INR 1.0   ABG No results for input(s): PHART, HCO3 in the last 72 hours.  Invalid input(s): PCO2, PO2  Studies/Results: DG Chest 2 View  Result Date: 10/07/2021 CLINICAL DATA:  Possible sepsis EXAM: CHEST - 2 VIEW COMPARISON:  Chest radiograph 03/25/2021 FINDINGS: The heart is borderline enlarged, unchanged. The mediastinal contours are stable. Lung volumes are low. Linear opacities in the lung bases likely reflect subsegmental atelectasis. There is no focal consolidation or pulmonary edema. There is no pleural effusion or pneumothorax. There is multilevel degenerative change throughout the imaged spine. There is no acute osseous abnormality. IMPRESSION: Borderline cardiomegaly and low lung volumes with bibasilar atelectasis. Electronically Signed   By: Valetta Mole M.D.   On: 10/07/2021 16:07     Assessment/Plan: Urethral stricture disease with difficult foley placement.   He has had 2 prior dilations of the stricture/bladder neck contracture and was on CIC but has not been compliant.  Foley couldn't be placed today.   His PVR is about 421ml but he has voided some since admission after no void for several hours.  He is generally incontinence and is bed bound with a history of decubiti so I think it would be most appropriate to have IR place a suprapubic tube for chronic bladder management.   Sepsis possibly of urinary origin.  He has no flank pain and he is currently afebrile with a stable blood  pressure.   Since he is stable and voiding, I believe he  can have the SP tube placed tomorrow and not emergently tonight.     AKI.  Consider renal US if it doesn't respond to fluid resusitation.     No follow-ups on file.    CC: Dr. Harvest Dark     Irine Seal 10/07/2021 [phone]

## 2021-10-07 NOTE — Sepsis Progress Note (Signed)
Sepsis protocol is being followed by [facility]. 

## 2021-10-07 NOTE — ED Notes (Signed)
Dr Kerman Passey notified of lactic 3.3. fluids ordered

## 2021-10-07 NOTE — Telephone Encounter (Signed)
Erline Levine a physical therapist there at house with pt called in.   The line disconnected so I called back to the pt's house and connected with her and pt's wife.   Pt has dementia.   Erline Levine took his BP manually in both arms with an upper arm cuff and got 80/50.   Wife said he is very lethargic and hasn't urinated since 8:30 this morning.   He has strictures and an enlarged prostate so the home health nurse that came out yesterday wasn't able to insert a urinary catheter.   Wife said she was concerned about him being septic.   I agreed and referred them to the ED.   Wife is going to call 911 and let them know he needs transported to the ED non emergency since he is in a wheelchair and is non ambulatory normally.    "He has to be transported when he needs to go to the hospital".    Erline Levine and his wife thanked me for my help and are calling 911 now.

## 2021-10-07 NOTE — Telephone Encounter (Signed)
Erline Levine a physical therapist calling in but when call transferred to me it was disconnected or no one was on the line.   Reason for Disposition  [7] Systolic BP < 90 AND [4] dizzy, lightheaded, or weak    Lethargic and unable to pass urine since 8:30 this morning  Answer Assessment - Initial Assessment Questions 1. BLOOD PRESSURE: "What is the blood pressure?" "Did you take at least two measurements 5 minutes apart?"     PT with pt.   He hasn't peed since 8:30 this morning and he is very lethargic.   80/50 in both arms.   130/70 is his normal.   120/70 about his average.   Today it's really low.    97/47 Friday.    He has strictures a nurse could not pass catheter yesterday.    No urine since 8:30 this morning.   He has dementia.    He is grimacing.   2. ONSET: "When did you take your blood pressure?"     Just now manually in both arms with a urine cuff.   3. HOW: "How did you obtain the blood pressure?" (e.g., visiting nurse, automatic home BP monitor)     He did a urine culture Thur. A home health nurse came by and got the sample and took it to [facility].   But we haven't heard anything.   He is in a wheelchair and can't get out.   4. HISTORY: "Do you have a history of low blood pressure?" "What is your blood pressure normally?"     No 5. MEDICATIONS: "Are you taking any medications for blood pressure?" If Yes, ask: "Have they been changed recently?"     Triage stopped at this point.  Due to low BP, lethargic and unable to pass urine since 8:30 this morning I referred them to call 911 and have him transported to the hospital.    Wife was there as well as the physical therapist Erline Levine and they were in agreement with him being taken non emergency via ambulance to the ED.    6. PULSE RATE: "Do you know what your pulse rate is?"      N/A 7. OTHER SYMPTOMS: "Have you been sick recently?" "Have you had a recent injury?"     He has dementia so it's hard to get information from him. 8. PREGNANCY: "Is  there any chance you are pregnant?" "When was your last menstrual period?"     N/A  Protocols used: Blood Pressure - Low-A-AH

## 2021-10-07 NOTE — Consult Note (Signed)
CODE SEPSIS - PHARMACY COMMUNICATION  **Broad Spectrum Antibiotics should be administered within 1 hour of Sepsis diagnosis**  Time Code Sepsis Called/Page Received: 1531  Antibiotics Ordered: cefepime, metronidazole, vancomycin   Time of 1st antibiotic administration: [place], PharmD Pharmacy Resident  10/07/2021 3:32 PM

## 2021-10-07 NOTE — Consult Note (Signed)
Pharmacy Antibiotic Note  Norman Palmer is a 80 y.o. male with medical history including urethral stricture, BPH with LUTS, history of MRSA infection, morbid obesity, recurrent UTI, diabetes admitted on 10/07/2021 with sepsis secondary to suspected urinary source versus infected sacral wound.  Pharmacy has been consulted for vancomycin dosing. Patient is also ordered cefepime.   Scr 1.93 on admission. Baseline appears to be ~1.5. Patient is receiving fluids.  Plan:  Vancomycin 2 g IV LD (1 g + 1 g) Will dose per levels for now given AKI Daily Scr per protocol. If renal function improves with fluids, suspect can start scheduled maintenance regimen tomorrow  Height: 6' (182.9 cm) Weight: 129.7 kg (285 lb 15 oz) IBW/kg (Calculated) : 77.6  Temp (24hrs), Avg:97.7 F (36.5 C), Min:97.6 F (36.4 C), Max:97.8 F (36.6 C)  Recent Labs  Lab 10/07/21 1456 10/07/21 1457 10/07/21 1735  WBC 11.3*  --   --   CREATININE 1.93*  --   --   LATICACIDVEN  --  3.3* 2.0*    Estimated Creatinine Clearance: 44.6 mL/min (A) (by C-G formula based on SCr of 1.93 mg/dL (H)).    Allergies  Allergen Reactions   Doxycycline Rash   Zinc Rash    Antimicrobials this admission: Metronidazole 10/17 x 1 Cefepime 10/17 >>  Vancomycin 10/17 >>   Dose adjustments this admission: N/A  Microbiology results: 10/17 BCx: pending 10/17 UCx: pending  Thank you for allowing pharmacy to be a part of this patient's care.  Benita Gutter 10/07/2021 10:01 PM

## 2021-10-07 NOTE — ED Notes (Signed)
Pt in XRAY 

## 2021-10-07 NOTE — ED Notes (Signed)
Placed on external cath

## 2021-10-08 ENCOUNTER — Inpatient Hospital Stay: Payer: Medicare Other | Admitting: Radiology

## 2021-10-08 DIAGNOSIS — F444 Conversion disorder with motor symptom or deficit: Secondary | ICD-10-CM

## 2021-10-08 DIAGNOSIS — G9341 Metabolic encephalopathy: Secondary | ICD-10-CM

## 2021-10-08 DIAGNOSIS — Z794 Long term (current) use of insulin: Secondary | ICD-10-CM

## 2021-10-08 DIAGNOSIS — E1140 Type 2 diabetes mellitus with diabetic neuropathy, unspecified: Secondary | ICD-10-CM

## 2021-10-08 DIAGNOSIS — Z9359 Other cystostomy status: Secondary | ICD-10-CM

## 2021-10-08 DIAGNOSIS — L89153 Pressure ulcer of sacral region, stage 3: Secondary | ICD-10-CM

## 2021-10-08 DIAGNOSIS — F03918 Unspecified dementia, unspecified severity, with other behavioral disturbance: Secondary | ICD-10-CM

## 2021-10-08 DIAGNOSIS — N390 Urinary tract infection, site not specified: Secondary | ICD-10-CM

## 2021-10-08 DIAGNOSIS — N1831 Chronic kidney disease, stage 3a: Secondary | ICD-10-CM

## 2021-10-08 HISTORY — PX: IR GUIDED DRAIN W CATHETER PLACEMENT: IMG719

## 2021-10-08 HISTORY — DX: Other cystostomy status: Z93.59

## 2021-10-08 LAB — RENAL FUNCTION PANEL
Albumin: 2.8 g/dL — ABNORMAL LOW (ref 3.5–5.0)
Anion gap: 8 (ref 5–15)
BUN: 57 mg/dL — ABNORMAL HIGH (ref 8–23)
CO2: 20 mmol/L — ABNORMAL LOW (ref 22–32)
Calcium: 8.4 mg/dL — ABNORMAL LOW (ref 8.9–10.3)
Chloride: 104 mmol/L (ref 98–111)
Creatinine, Ser: 1.33 mg/dL — ABNORMAL HIGH (ref 0.61–1.24)
GFR, Estimated: 55 mL/min — ABNORMAL LOW (ref >60)
Glucose, Bld: 132 mg/dL — ABNORMAL HIGH (ref 70–99)
Phosphorus: 4 mg/dL (ref 2.5–4.6)
Potassium: 4.8 mmol/L (ref 3.5–5.1)
Sodium: 132 mmol/L — ABNORMAL LOW (ref 135–145)

## 2021-10-08 LAB — CBC
HCT: 30.7 % — ABNORMAL LOW (ref 39.0–52.0)
Hemoglobin: 10.1 g/dL — ABNORMAL LOW (ref 13.0–17.0)
MCH: 31.3 pg (ref 26.0–34.0)
MCHC: 32.9 g/dL (ref 30.0–36.0)
MCV: 95 fL (ref 80.0–100.0)
Platelets: 282 10*3/uL (ref 150–400)
RBC: 3.23 MIL/uL — ABNORMAL LOW (ref 4.22–5.81)
RDW: 13 % (ref 11.5–15.5)
WBC: 10.4 10*3/uL (ref 4.0–10.5)
nRBC: 0 % (ref 0.0–0.2)

## 2021-10-08 LAB — LACTIC ACID, PLASMA: Lactic Acid, Venous: 1.5 mmol/L (ref 0.5–1.9)

## 2021-10-08 LAB — APTT: aPTT: 30 seconds (ref 24–36)

## 2021-10-08 LAB — PROTIME-INR
INR: 1.1 (ref 0.8–1.2)
Prothrombin Time: 13.8 seconds (ref 11.4–15.2)

## 2021-10-08 LAB — URINE CULTURE: Culture: 20000 — AB

## 2021-10-08 LAB — GLUCOSE, CAPILLARY
Glucose-Capillary: 102 mg/dL — ABNORMAL HIGH (ref 70–99)
Glucose-Capillary: 106 mg/dL — ABNORMAL HIGH (ref 70–99)
Glucose-Capillary: 107 mg/dL — ABNORMAL HIGH (ref 70–99)
Glucose-Capillary: 118 mg/dL — ABNORMAL HIGH (ref 70–99)
Glucose-Capillary: 126 mg/dL — ABNORMAL HIGH (ref 70–99)
Glucose-Capillary: 141 mg/dL — ABNORMAL HIGH (ref 70–99)
Glucose-Capillary: 172 mg/dL — ABNORMAL HIGH (ref 70–99)

## 2021-10-08 IMAGING — US IR ABSCESS DRAINAGE
1 series · 3 of 3 positions shown · non-contrast
Comparison: None.

INDICATION: 79-year-old male with dementia, partial bladder outlet obstruction,
and recurrent urinary tract infections, currently admitted with
urosepsis.

EXAM:
Fluoroscopic and ultrasound-guided placement of suprapubic catheter.

[Series 1: ir abscess drainage · 0.22mm/px · 3 of 3 slices shown]
[im 1/3]
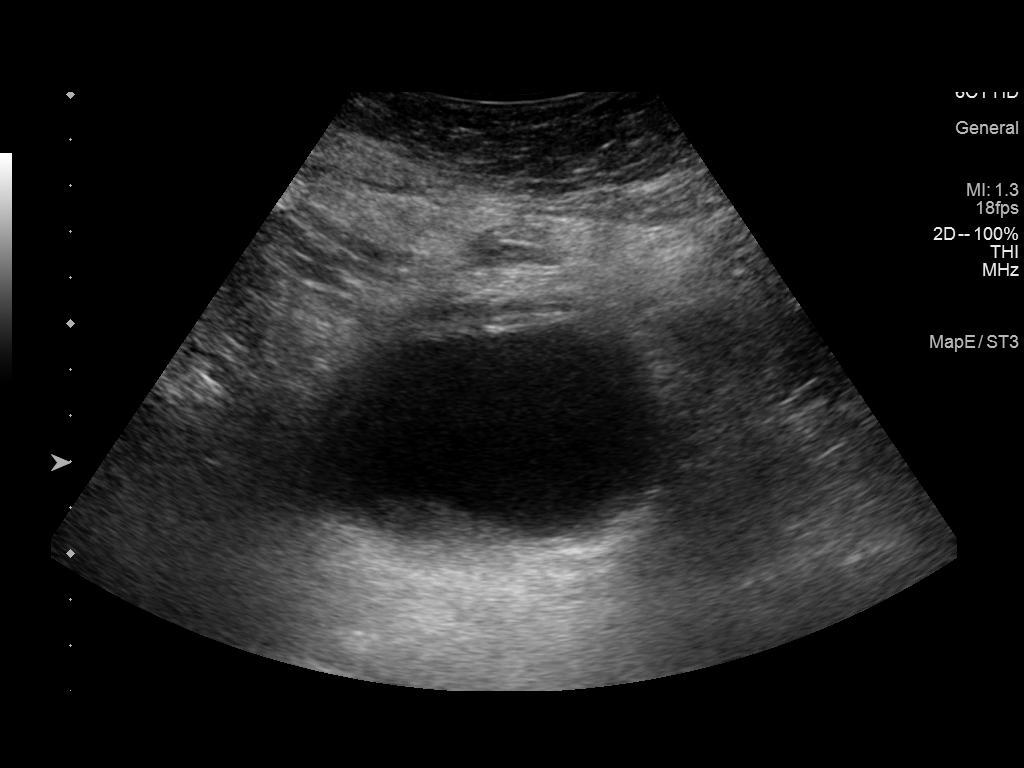
[im 2/3]
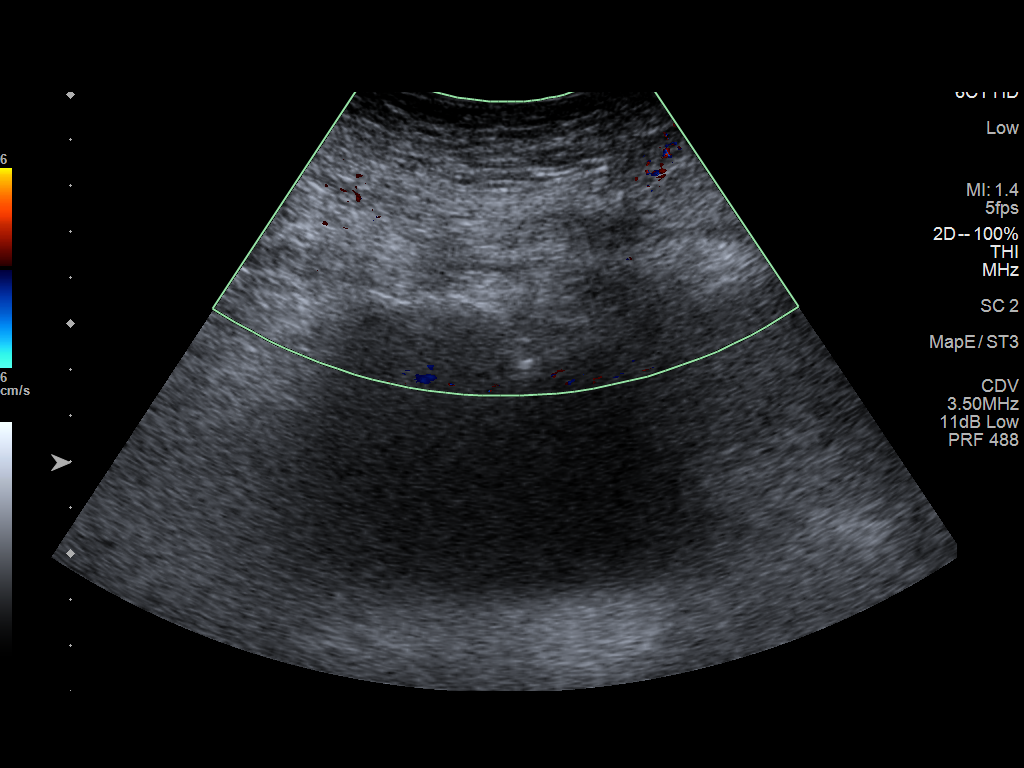
[im 3/3]
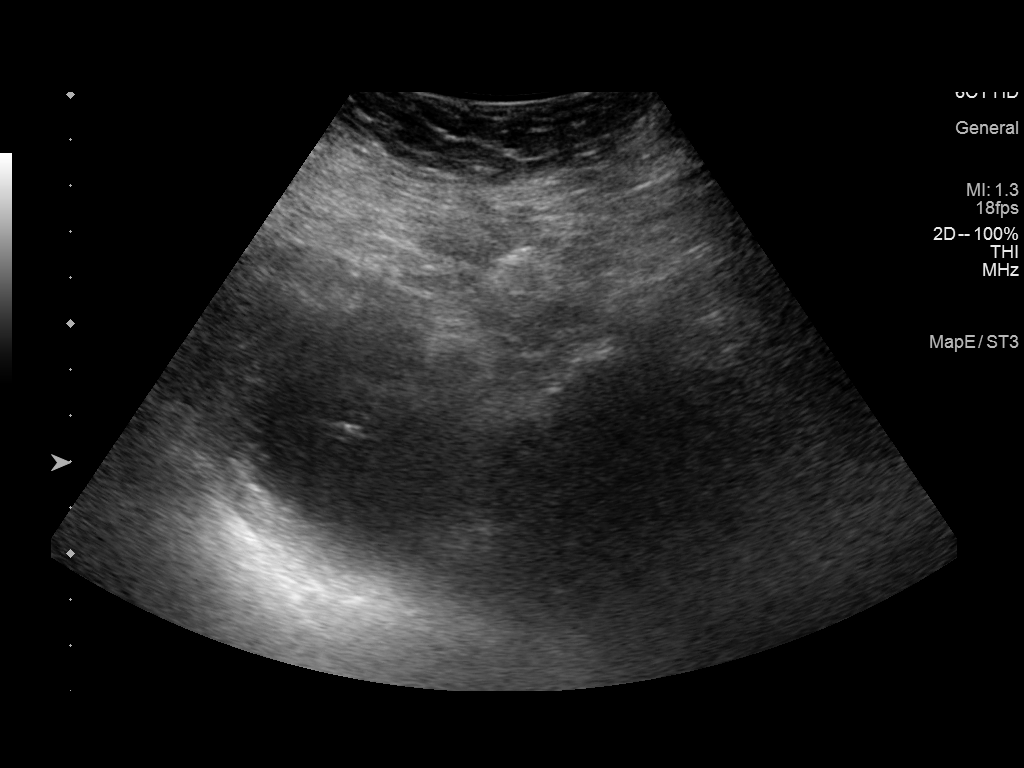

[3 of 3 positions shown; findings below may reference images not displayed]

MEDICATIONS:
None

ANESTHESIA/SEDATION:
Moderate (conscious) sedation was employed during this procedure. A
total of Versed 0 mg and Fentanyl 25 mcg was administered
intravenously.

Moderate Sedation Time: 20 minutes. The patient's level of
consciousness and vital signs were monitored continuously by
radiology nursing throughout the procedure under my direct
supervision.

CONTRAST:  8 mL Omnipaque 350, administered into the urinary bladder

FLUOROSCOPY TIME:  24 seconds, 6.5 mGy

COMPLICATIONS:
None immediate.

PROCEDURE:
The procedure, risks, benefits, and alternatives were explained to
the patient. Questions regarding the procedure were encouraged and
answered. The patient understands and consents to the procedure. A
timeout was performed prior to the initiation of the procedure.

The patient was positioned supine. The skin overlying the anterior
aspect the lower pelvis was prepped and draped in usual sterile
fashion.

Under direct ultrasound guidance, a 22 gauge needle was utilized for
the purposes of procedural planning after the overlying soft tissues
were anesthetized with 1% lidocaine.

Next, an 18 gauge trocar needle was advanced into the urinary
bladder under direct ultrasound guidance. Ultrasound image was saved
for procedural documentation purposes. A short Amplatz wire was
coiled within the urinary bladder. Appropriate position was
confirmed fluoroscopically.

The track was serially dilated ultimately allowing placement of a 12
French all-purpose drainage catheter with end coiled and locked
within the urinary bladder.

The catheter was connected to a gravity bag. The exit site of the
catheter was secured within interrupted suture. A dressing was
placed. The patient tolerated the procedure well without immediate
postprocedural complication.
FINDINGS: Ultrasound and fluoroscopy were utilized for the placement of a 12
French percutaneous drainage catheter with end coiled and locked
within the urinary bladder.
IMPRESSION: Technically successful ultrasound and fluoroscopic guided placement
of a 12 French suprapubic catheter.

PLAN:
Recommend fluoroscopic guided exchange and up sizing of this
suprapubic drainage catheter in approximately 6-8 weeks, otherwise
management as per Urology.

## 2021-10-08 IMAGING — XA IR ABSCESS DRAINAGE
1 series · 9 of 9 positions shown · non-contrast
Comparison: None.

INDICATION: 79-year-old male with dementia, partial bladder outlet obstruction,
and recurrent urinary tract infections, currently admitted with
urosepsis.

EXAM:
Fluoroscopic and ultrasound-guided placement of suprapubic catheter.

[Series 1: interv standard · 3 acquisitions, 9 frames shown]
[im 1/3]
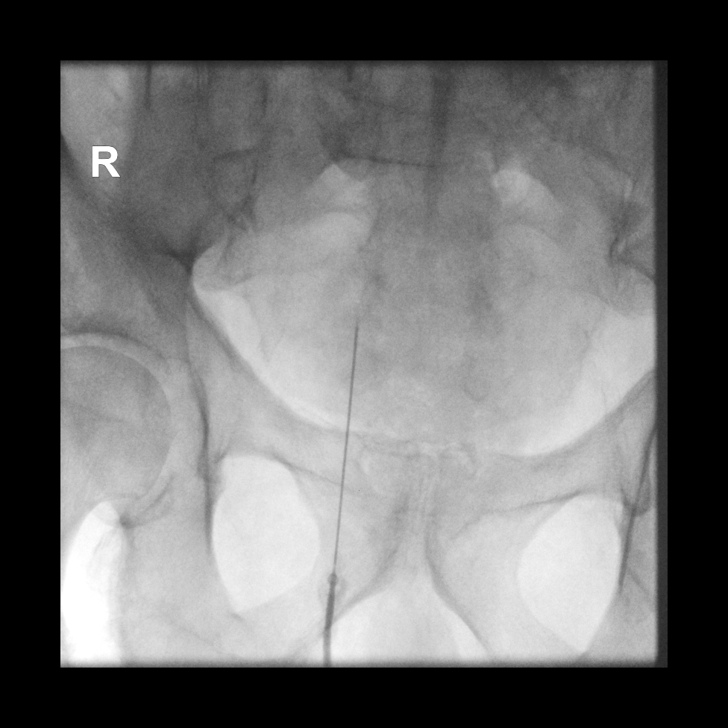
[im 1/3]
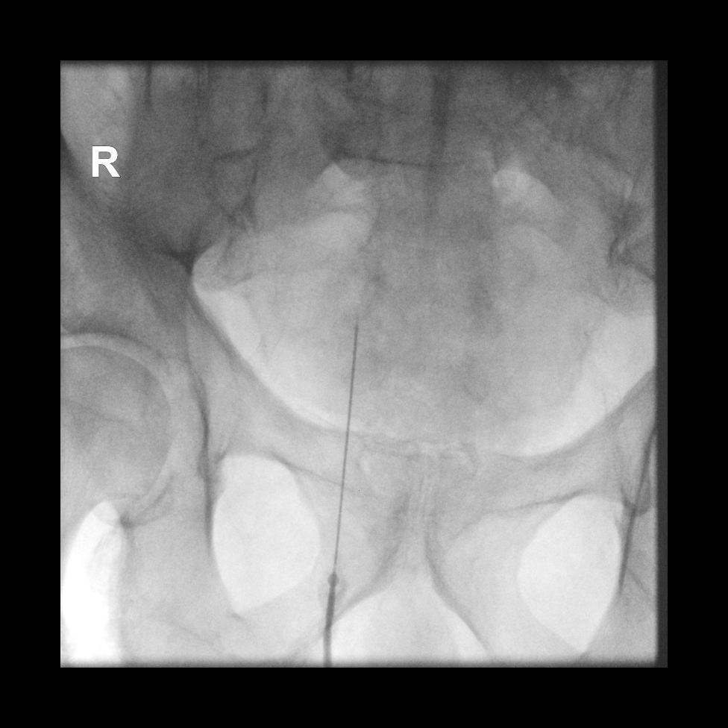
[im 1/3]
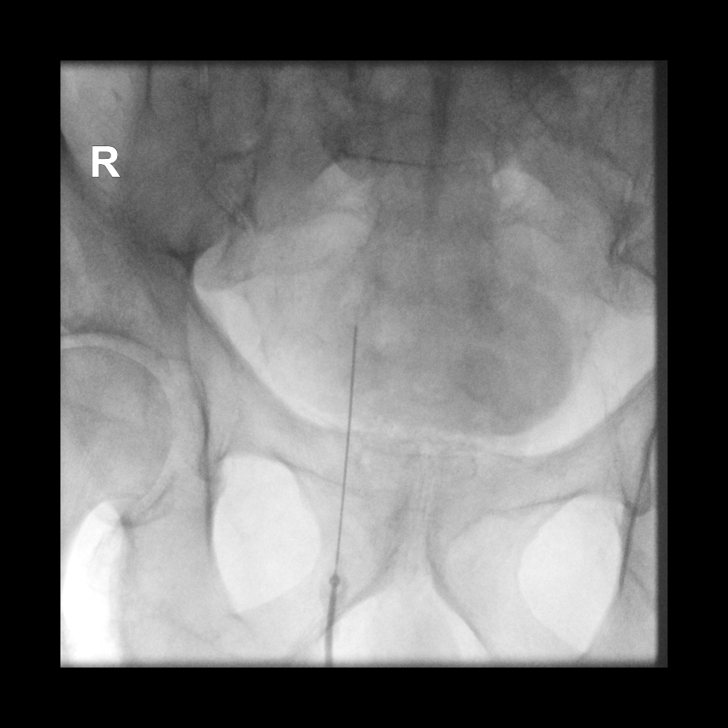
[im 1/3]
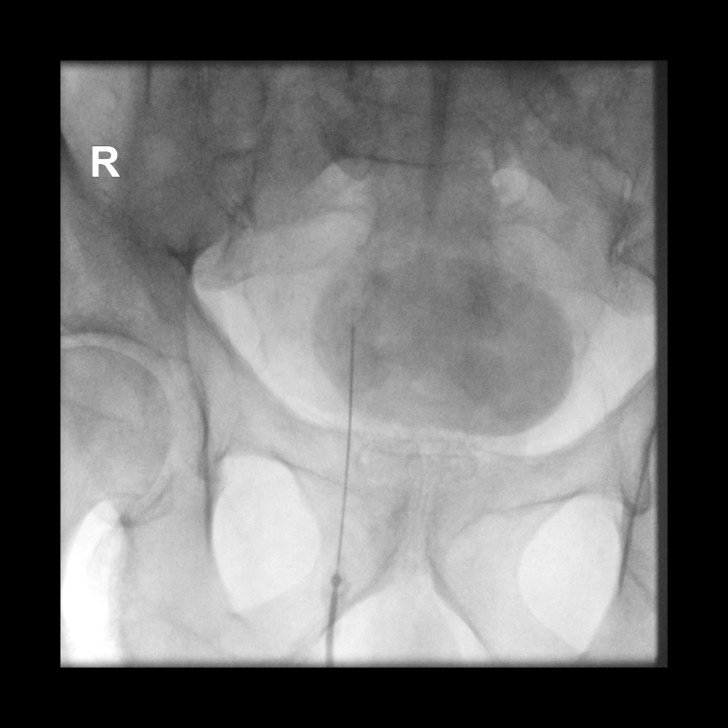
[im 2/3]
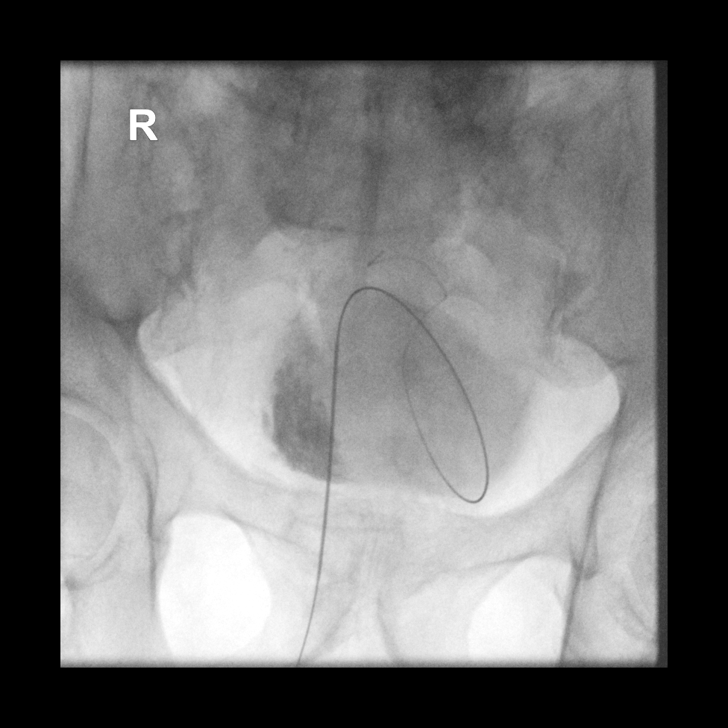
[im 3/3]
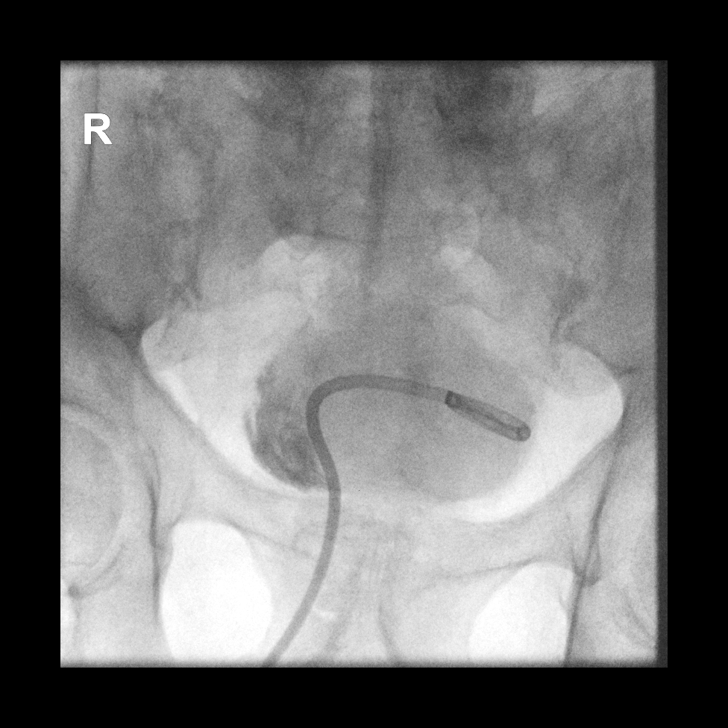
[im 3/3]
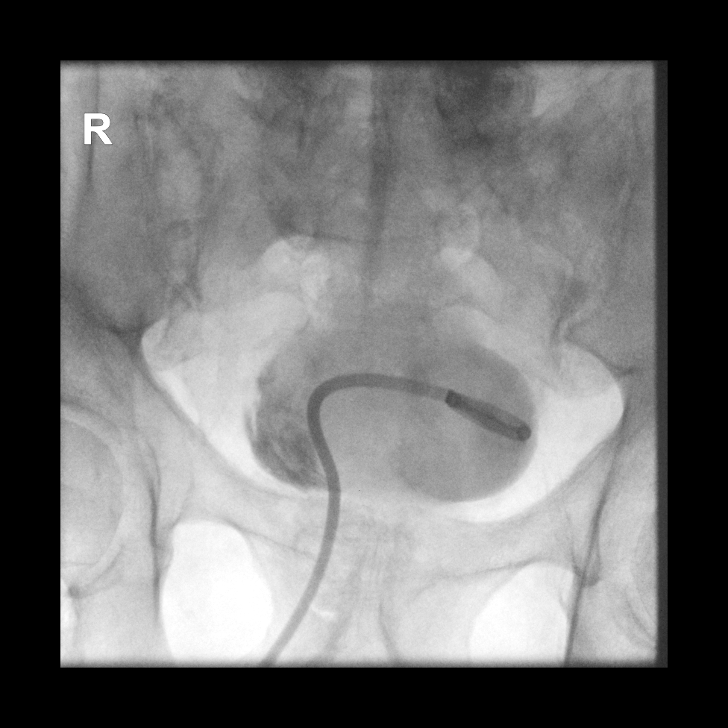
[im 3/3]
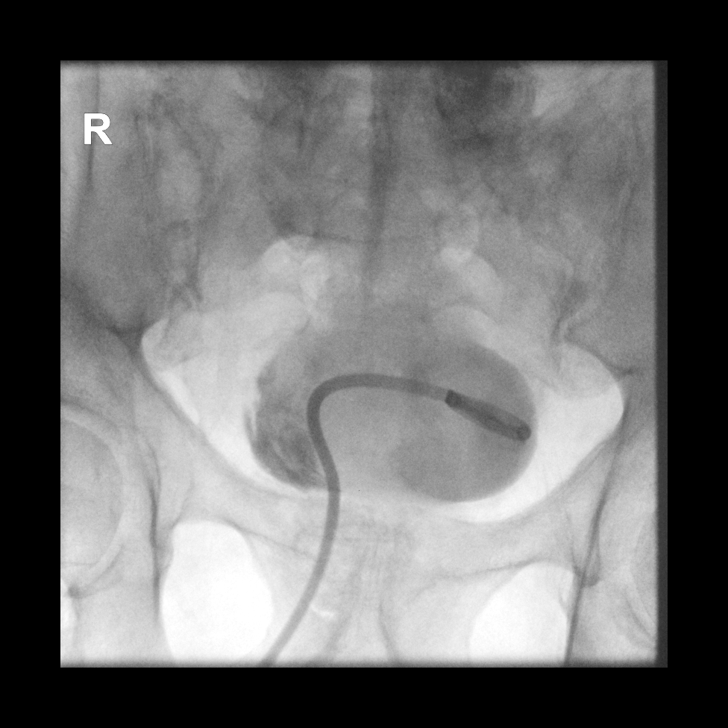
[im 3/3]
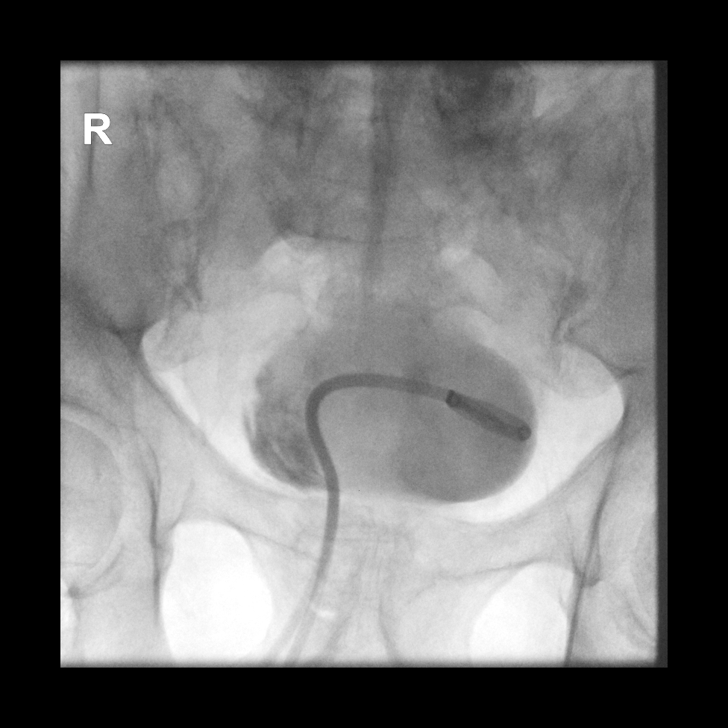

[9 of 9 positions shown; findings below may reference images not displayed]

MEDICATIONS:
None

ANESTHESIA/SEDATION:
Moderate (conscious) sedation was employed during this procedure. A
total of Versed 0 mg and Fentanyl 25 mcg was administered
intravenously.

Moderate Sedation Time: 20 minutes. The patient's level of
consciousness and vital signs were monitored continuously by
radiology nursing throughout the procedure under my direct
supervision.

CONTRAST:  8 mL Omnipaque 350, administered into the urinary bladder

FLUOROSCOPY TIME:  24 seconds, 6.5 mGy

COMPLICATIONS:
None immediate.

PROCEDURE:
The procedure, risks, benefits, and alternatives were explained to
the patient. Questions regarding the procedure were encouraged and
answered. The patient understands and consents to the procedure. A
timeout was performed prior to the initiation of the procedure.

The patient was positioned supine. The skin overlying the anterior
aspect the lower pelvis was prepped and draped in usual sterile
fashion.

Under direct ultrasound guidance, a 22 gauge needle was utilized for
the purposes of procedural planning after the overlying soft tissues
were anesthetized with 1% lidocaine.

Next, an 18 gauge trocar needle was advanced into the urinary
bladder under direct ultrasound guidance. Ultrasound image was saved
for procedural documentation purposes. A short Amplatz wire was
coiled within the urinary bladder. Appropriate position was
confirmed fluoroscopically.

The track was serially dilated ultimately allowing placement of a 12
French all-purpose drainage catheter with end coiled and locked
within the urinary bladder.

The catheter was connected to a gravity bag. The exit site of the
catheter was secured within interrupted suture. A dressing was
placed. The patient tolerated the procedure well without immediate
postprocedural complication.
FINDINGS: Ultrasound and fluoroscopy were utilized for the placement of a 12
French percutaneous drainage catheter with end coiled and locked
within the urinary bladder.
IMPRESSION: Technically successful ultrasound and fluoroscopic guided placement
of a 12 French suprapubic catheter.

PLAN:
Recommend fluoroscopic guided exchange and up sizing of this
suprapubic drainage catheter in approximately 6-8 weeks, otherwise
management as per Urology.

## 2021-10-08 MED ORDER — FENTANYL CITRATE (PF) 100 MCG/2ML IJ SOLN
INTRAMUSCULAR | Status: AC
  Filled 2021-10-08: qty 2

## 2021-10-08 MED ORDER — HYDROCHLOROTHIAZIDE 25 MG PO TABS
25.0000 mg | ORAL_TABLET | Freq: Every day | ORAL | Status: DC
  Administered 2021-10-09: 25 mg via ORAL
  Filled 2021-10-08: qty 1

## 2021-10-08 MED ORDER — SODIUM CHLORIDE 0.9% FLUSH
5.0000 mL | Freq: Three times a day (TID) | INTRAVENOUS | Status: DC
  Administered 2021-10-08 – 2021-10-09 (×3): 5 mL

## 2021-10-08 MED ORDER — INFLUENZA VAC A&B SA ADJ QUAD 0.5 ML IM PRSY
0.5000 mL | PREFILLED_SYRINGE | INTRAMUSCULAR | Status: DC
  Filled 2021-10-08: qty 0.5

## 2021-10-08 MED ORDER — VANCOMYCIN HCL 1500 MG/300ML IV SOLN
1500.0000 mg | INTRAVENOUS | Status: DC
  Filled 2021-10-08: qty 300

## 2021-10-08 MED ORDER — GABAPENTIN 300 MG PO CAPS
300.0000 mg | ORAL_CAPSULE | Freq: Two times a day (BID) | ORAL | Status: DC
  Administered 2021-10-08 – 2021-10-09 (×3): 300 mg via ORAL
  Filled 2021-10-08 (×3): qty 1

## 2021-10-08 MED ORDER — INSULIN ASPART 100 UNIT/ML IJ SOLN
0.0000 [IU] | Freq: Three times a day (TID) | INTRAMUSCULAR | Status: DC
Start: 2021-10-09 — End: 2021-10-09
  Administered 2021-10-09: 2 [IU] via SUBCUTANEOUS
  Filled 2021-10-08: qty 1

## 2021-10-08 MED ORDER — IOHEXOL 350 MG/ML SOLN
8.0000 mL | Freq: Once | INTRAVENOUS | Status: AC | PRN
  Administered 2021-10-08: 8 mL

## 2021-10-08 MED ORDER — INSULIN DETEMIR 100 UNIT/ML ~~LOC~~ SOLN
22.0000 [IU] | Freq: Every day | SUBCUTANEOUS | Status: DC
  Filled 2021-10-08: qty 0.22

## 2021-10-08 MED ORDER — SODIUM CHLORIDE 0.9 % IV SOLN
2.0000 g | Freq: Three times a day (TID) | INTRAVENOUS | Status: DC
  Administered 2021-10-08 – 2021-10-09 (×2): 2 g via INTRAVENOUS
  Filled 2021-10-08 (×4): qty 2

## 2021-10-08 MED ORDER — LIDOCAINE HCL 1 % IJ SOLN
INTRAMUSCULAR | Status: AC
  Administered 2021-10-08: 20 mL
  Filled 2021-10-08: qty 20

## 2021-10-08 MED ORDER — FENTANYL CITRATE (PF) 100 MCG/2ML IJ SOLN
INTRAMUSCULAR | Status: DC | PRN
  Administered 2021-10-08 (×2): 12.5 ug via INTRAVENOUS

## 2021-10-08 MED ORDER — MIDAZOLAM HCL 2 MG/2ML IJ SOLN
INTRAMUSCULAR | Status: AC
  Filled 2021-10-08: qty 2

## 2021-10-08 MED ORDER — TRAMADOL HCL 50 MG PO TABS
50.0000 mg | ORAL_TABLET | Freq: Every day | ORAL | Status: DC
Start: 2021-10-08 — End: 2021-10-09
  Administered 2021-10-08 – 2021-10-09 (×2): 50 mg via ORAL
  Filled 2021-10-08 (×2): qty 1

## 2021-10-08 NOTE — Consult Note (Signed)
Chief Complaint: Patient was seen in consultation today for urethral stricture  Referring Physician(s): Irine Seal, MD  Patient Status: [place] - In-pt  History of Present Illness: Norman Palmer is a 80 y.o. male with history of dementia, decubitus ulcers, hypospadias, urethral strictures, and benign prostatic hyperplasia who presented to the [facility] ED from home on 10/07/21 with worsening confusion in the setting of >12 hours of anuria and sepsis.  He has reported history of recurrent urinary tract infections, and similar past episodes when retaining urine.  He was started on IV vancomycin and has be resuscitated with fluid.  Since admission he has had at least 2 episodes of incontinence.  PVR performed yesterday demonstrated approximately 400 mL. He lives at home with his wife, Norman Palmer, where they have a 24 hour a day caretaker.    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 Manley Mason 07/2019   Hypospadias    and strictures   Infection with methicillin-resistant Staphylococcus aureus 08/22/2015   Insomnia    Lumbago    Sciatica    Thyroid disease     Past Surgical History:  Procedure Laterality Date   knee surgery Bilateral 2000   MOHS SURGERY Right 08/11/2019   BCC alar crease- [facility] Dr. Manley Mason   SKIN GRAFT Bilateral 1955   legs   URETHRAL STRICTURE DILATATION      Allergies: Doxycycline and Zinc  Medications: Prior to Admission medications   Medication Sig Start Date End Date Taking? Authorizing Provider  celecoxib (CELEBREX) 100 MG capsule Take 1 capsule (100 mg total) by mouth daily. For pain 08/06/21  Yes Birdie Sons, MD  cephALEXin (KEFLEX) 250 MG capsule TAKE 1 CAPSULE BY MOUTH EVERY DAY FOR UTI PREVENTION 09/25/21   Yes Birdie Sons, MD  divalproex (DEPAKOTE) 125 MG DR tablet Take 1 tablet by mouth in the morning and at bedtime. 06/12/21  Yes [provider]  donepezil (ARICEPT) 10 MG tablet Take 10 mg by mouth at bedtime.    Yes [provider]  dutasteride (AVODART) 0.5 MG capsule Take 1 capsule by mouth at bedtime. 02/07/21  Yes [provider]  finasteride (PROSCAR) 5 MG tablet Take 1 tablet (5 mg total) by mouth daily. 06/11/21  Yes Vaillancourt, Samantha, PA-C  gabapentin (NEURONTIN) 300 MG capsule Take 1 capsule (300 mg total) by mouth 2 (two) times daily. 09/20/21  Yes Birdie Sons, MD  insulin NPH Human (NOVOLIN N) 100 UNIT/ML injection Inject 22 Units into the skin daily. 11/09/20 11/09/21 Yes [provider]  lisinopril (ZESTRIL) 20 MG tablet TAKE 1 TABLET BY MOUTH DAILY 09/01/21  Yes Birdie Sons, MD  memantine (NAMENDA) 10 MG tablet Take 1 tablet (10 mg total) by mouth 2 (two) times daily. 09/23/21  Yes Birdie Sons, MD  metFORMIN (GLUCOPHAGE) 1000 MG tablet TAKE ONE TABLET TWICE DAILY WITH MEALS Patient taking differently: Take 1,000 mg by mouth 2 (two) times daily with a meal. 07/11/19  Yes Birdie Sons, MD  risperiDONE (RISPERDAL) 0.5 MG tablet TAKE 1 TABLET BY MOUTH AT BEDTIME 08/29/21  Yes Birdie Sons, MD  sertraline (ZOLOFT) 50 MG tablet Take 50 mg by mouth daily. 11/03/19  Yes [provider]  silodosin (RAPAFLO) 8 MG CAPS capsule Take 1 capsule (8 mg total) by mouth daily with breakfast. 06/11/21  Yes Vaillancourt, Samantha, PA-C  silver sulfADIAZINE (SILVADENE) 1 % cream Apply 1 application topically daily. 09/11/21  Yes Birdie Sons, MD  traMADol Veatrice Bourbon) 50 MG tablet TAKE ONE TABLET EVERY 8 HOURS AS NEEDED 09/09/21  Yes Birdie Sons, MD  triamterene-hydrochlorothiazide (DYAZIDE) 37.5-25 MG capsule TAKE 1 CAPSULE BY MOUTH EACH MORNING 06/18/21  Yes Birdie Sons, MD  zinc oxide 20 % ointment Apply 1 application topically  as needed for irritation. Apply to affected area daily 05/14/21  Yes Fisher, Kirstie Peri, MD  ALPRAZolam Duanne Moron) 1 MG tablet TAKE 1 TABLET BY MOUTH THREE TIMES DAILY AS NEEDED Patient not taking: No sig reported 06/04/21   Birdie Sons, MD  Vitamin D, Ergocalciferol, (DRISDOL) 1.25 MG (50000 UNIT) CAPS capsule TAKE 1 CAPSULE BY MOUTH ONCE A WEEK Patient taking differently: wednesday 08/29/21   Birdie Sons, MD     Family History  Problem Relation Age of Onset   Hypertension Father    Cancer Brother        Liver Cancer    Social History   Socioeconomic History   Marital status: Married    Spouse name: Not on file   Number of children: 3   Years of education: MBA   Highest education level: Master's degree (e.g., MA, MS, MEng, MEd, MSW, MBA)  Occupational History   Occupation: retired  Tobacco Use   Smoking status: Former    Packs/day: 2.00    Years: 30.00    Pack years: 60.00    Types: Cigarettes   Smokeless tobacco: Former   Tobacco comments:    quit over 20 years ago  Vaping Use   Vaping Use: Never used  Substance and Sexual Activity   Alcohol use: Yes    Alcohol/week: 1.0 - 2.0 standard drink    Types: 1 - 2 Cans of beer per week   Drug use: No   Sexual activity: Not on file  Other Topics Concern   Not on file  Social History Narrative   Not on file   Social Determinants of Health   Financial Resource Strain: Not on file  Food Insecurity: Not on file  Transportation Needs: Not on file  Physical Activity: Not on file  Stress: Not on file  Social Connections: Not on file    Review of Systems: A 12 point ROS discussed and pertinent positives are indicated in the HPI above.  All other systems are negative.   Vital Signs: BP (!) 157/73 (BP Location: Right Arm)   Pulse 90   Temp 98.2 F (36.8 C)   Resp 16   Ht 6' (1.829 m)   Wt 129.7 kg   SpO2 99%   BMI 38.78 kg/m   Physical Exam Constitutional:      General: He is not in acute distress. HENT:      Head: Normocephalic.     Mouth/Throat:     Mouth: Mucous membranes are moist.  Cardiovascular:     Rate and Rhythm: Normal rate and regular rhythm.     Heart sounds: Normal heart sounds.  Pulmonary:     Breath sounds: Normal breath sounds.     Comments: MP2 Abdominal:     General: There is no distension.  Skin:    General: Skin is warm and dry.  Neurological:     Mental Status: He is alert. He  is disoriented.    Imaging: DG Chest 2 View  Result Date: 10/07/2021 CLINICAL DATA:  Possible sepsis EXAM: CHEST - 2 VIEW COMPARISON:  Chest radiograph 03/25/2021 FINDINGS: The heart is borderline enlarged, unchanged. The mediastinal contours are stable. Lung volumes are low. Linear opacities in the lung bases likely reflect subsegmental atelectasis. There is no focal consolidation or pulmonary edema. There is no pleural effusion or pneumothorax. There is multilevel degenerative change throughout the imaged spine. There is no acute osseous abnormality. IMPRESSION: Borderline cardiomegaly and low lung volumes with bibasilar atelectasis. Electronically Signed   By: Valetta Mole M.D.   On: 10/07/2021 16:07    Labs:  CBC: Recent Labs    03/26/21 0541 05/07/21 0959 10/07/21 1456 10/08/21 0012  WBC 15.1* 16.1* 11.3* 10.4  HGB 9.6* 12.3* 11.4* 10.1*  HCT 29.7* 38.5 35.0* 30.7*  PLT 330 437 339 282    COAGS: Recent Labs    10/07/21 1457 10/08/21 0012  INR 1.0 1.1  APTT 30 30    BMP: Recent Labs    03/26/21 0541 05/07/21 0959 10/07/21 1456 10/07/21 2217 10/08/21 0012  NA 130* 136 134* 133* 132*  K 4.4 5.1 5.5* 4.9 4.8  CL 103 97 99 102 104  CO2 22 19* 22 24 20*  GLUCOSE 174* 144* 95 117* 132*  BUN 48* 30* 68* 66* 57*  CALCIUM 8.3* 9.6 9.2 8.5* 8.4*  CREATININE 1.28* 1.36* 1.93* 1.56* 1.33*  GFRNONAA 58*  --  35* 45* 55*    LIVER FUNCTION TESTS: Recent Labs    03/25/21 0458 03/26/21 0541 05/07/21 0959 10/07/21 1456 10/07/21 2217 10/08/21 0012  BILITOT 0.6 0.6  <0.2 0.3  --   --   AST 104* 70* 16 23  --   --   ALT 103* 100* 18 14  --   --   ALKPHOS 63 55 96 53  --   --   PROT 6.0* 5.7* 7.0 6.7  --   --   ALBUMIN 2.5* 2.3* 4.2 3.1* 3.1* 2.8*    TUMOR MARKERS: No results for input(s): AFPTM, CEA, CA199, CHROMGRNA in the last 8760 hours.  Assessment and Plan:  80 year old male with history of severe urethral stricture presenting with sepsis in the setting of urinary retention.  This is an acute on chronic episode.  Given dementia, poor mobility, and history of recurrent UTIs secondary to urinary retention he is an appropriate candidate for suprapubic catheter placement.  Will plan for minimal sedation and local anesthesia.  Risks and benefits discussed with the patient and his wife including bleeding, infection, damage to adjacent structures, bowel perforation/fistula connection, and sepsis.  All of the patient's wife's questions were answered, patient is agreeable to proceed. Consent signed and in chart.   Thank you for this interesting consult.  I greatly enjoyed meeting DUELL HOLDREN and look forward to participating in their care.  A copy of this report was sent to the requesting provider on this date.  Electronically Signed: Suzette Battiest, MD 10/08/2021, 8:50 AM   I spent a total of 40 Minutes in face to face in clinical consultation, greater than 50% of which was counseling/coordinating care for suprapubic catheter placement.

## 2021-10-08 NOTE — Assessment & Plan Note (Signed)
--   Secondary to UTI, expect to gradually improve

## 2021-10-08 NOTE — Assessment & Plan Note (Signed)
--   Appears stable at this point, continue Depakote, Aricept, Namenda, Risperdal, Zoloft

## 2021-10-08 NOTE — Consult Note (Signed)
[place] Nurse wound consult note Consultation was completed by review of records, images and assistance from the bedside nurse/clinical staff.   Reason for Consult: sacral wound Per wife patient had a Stage 3 there and had a VAC at one time.  This area has opened back up and she performs care at home for this wound. Patients bedside nurse and I have discussed patients care and wound care orders  Wound type: Healing Stage 3 with new breakdown Pressure Injury POA: Yes Measurement: 1.0 x 0.5cm x 0.3cm  Wound bed: pink, clean and moist Drainage (amount, consistency, odor) minimal  Periwound: intact  Dressing procedure/placement/frequency:  Continue silicone foam; change every 3 days and PRN soilage. Allow wife to perform wound care for wound if she prefers.   Air mattress for moisture management and pressure redistribution.    Re consult if needed, will not follow at this time. Thanks  Kelten Enochs [employer], RN,CWOCN, CNS, [place] [phone])

## 2021-10-08 NOTE — Assessment & Plan Note (Signed)
Stable

## 2021-10-08 NOTE — Assessment & Plan Note (Signed)
--   AKI appears resolved, creatinine appears to be at baseline.

## 2021-10-08 NOTE — Hospital Course (Signed)
80 year old man PMH recurrent UTI, bulbar urethral strictures, presented with increased aggression, altered mental status consistent with previous history of UTI, hypotension. --10/17 admitted for urethral stricture disease with difficulty Foley placement, unable to place by urology.  AKI superimposed on CKD stage III, acute metabolic encephalopathy, acute cystitis --10/18 Ultrasound and fluoroscopic guided suprapubic catheter placement

## 2021-10-08 NOTE — Assessment & Plan Note (Addendum)
--   No longer an issue status post suprapubic catheter placement

## 2021-10-08 NOTE — Assessment & Plan Note (Signed)
--   Appears to be stable with resolution of AKI

## 2021-10-08 NOTE — Assessment & Plan Note (Signed)
--  Healing Stage 3 with new breakdown. Continue silicone foam; change every 3 days and PRN soilage. Allow wife to perform wound care for wound if she prefers. Air mattress for moisture management and pressure redistribution.

## 2021-10-08 NOTE — Assessment & Plan Note (Signed)
--   Narrow antibiotic therapy to cefepime based on previous culture data, if continues to improve can likely transition to oral antibiotic tomorrow

## 2021-10-08 NOTE — Consult Note (Signed)
Pharmacy Antibiotic Note  Norman Palmer is a 80 y.o. male with medical history including urethral stricture, BPH with LUTS, history of MRSA infection, morbid obesity, recurrent UTI, diabetes admitted on 10/07/2021 with sepsis secondary to suspected urinary source versus infected sacral wound.  Pharmacy has been consulted for vancomycin dosing. Patient is also ordered cefepime.   Scr 1.93 on admission. Has now improved to baseline, 1.33. Will now use AUC dosing for Vancomycin.   Plan: Vancomycin 1500 mg IV Q 24 hrs. Goal AUC 400-550. Expected AUC: 494.5 Expected Css: 12.0 SCr used: 1.33   Height: 6' (182.9 cm) Weight: 129.7 kg (285 lb 15 oz) IBW/kg (Calculated) : 77.6  Temp (24hrs), Avg:97.9 F (36.6 C), Min:97.6 F (36.4 C), Max:98.2 F (36.8 C)  Recent Labs  Lab 10/07/21 1456 10/07/21 1457 10/07/21 1735 10/07/21 2217 10/08/21 0012  WBC 11.3*  --   --   --  10.4  CREATININE 1.93*  --   --  1.56* 1.33*  LATICACIDVEN  --  3.3* 2.0* 1.5 1.5     Estimated Creatinine Clearance: 64.7 mL/min (A) (by C-G formula based on SCr of 1.33 mg/dL (H)).    Allergies  Allergen Reactions   Doxycycline Rash   Zinc Rash    Antimicrobials this admission: Metronidazole 10/17 x 1 Cefepime 10/17 >>  Vancomycin 10/17 >>   Dose adjustments this admission: N/A  Microbiology results: 10/17 BCx: NG<24 hrs 10/17 UCx: pending  Thank you for allowing pharmacy to be a part of this patient's care.  Quentin Strebel A Dorothymae Maciver 10/08/2021 2:18 PM

## 2021-10-08 NOTE — Progress Notes (Signed)
  Progress Note Norman Palmer   ZWC:[id number]  DOB: 06/23/1941  DOA: 10/07/2021     1 Date of Service: 10/08/2021   Clinical Course 80 year old man PMH recurrent UTI, bulbar urethral strictures, presented with increased aggression, altered mental status consistent with previous history of UTI, hypotension. --10/17 admitted for urethral stricture disease with difficulty Foley placement, unable to place by urology.  AKI superimposed on CKD stage III, acute metabolic encephalopathy, acute cystitis --10/18 Ultrasound and fluoroscopic guided suprapubic catheter placement. Improving.  Likely home tomorrow.  Assessment and Plan * Acute lower UTI -- Narrow antibiotic therapy to cefepime based on previous culture data, if continues to improve can likely transition to oral antibiotic tomorrow  Stricture, Urethral NOS -- No longer an issue status post suprapubic catheter placement  Suprapubic catheter ([place]) -- Placed 10/18. Can consider serial upsize in 4-6 weeks, or management as per Urology.  Acute metabolic encephalopathy -- Secondary to UTI, expect to gradually improve  AKI ([place]) superimposed on CKD stage IIIa-resolved as of 10/08/2021 -- AKI appears resolved, creatinine appears to be at baseline.  CKD (chronic kidney disease), stage III (HCC) -- Appears to be stable with resolution of AKI  Type 2 diabetes mellitus with diabetic neuropathy, with long-term current use of insulin (HCC) -- CBG stable, continue Levemir, sliding scale insulin.  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 Continue silicone foam; change every 3 days and PRN soilage. Allow wife to perform wound care for wound if she prefers. Air mattress for moisture management and pressure redistribution.   Dementia with behavioral disturbance -- Appears stable at this point, continue Depakote, Aricept, Namenda, Risperdal, Zoloft  Functional  paraparesis -- Stable  Subjective:  Has dementia, wife at bedside, reports he gets "mean" when he has an infection.  She hopes to take him home soon.  Objective Vitals:   10/08/21 1020 10/08/21 1025 10/08/21 1119 10/08/21 1433  BP: (!) 159/93 (!) 146/62 139/63 133/63  Pulse: 85 85 79 87  Resp: 17 14 16 14   Temp:   98.2 F (36.8 C) 98.1 F (36.7 C)  TempSrc:      SpO2: 100% 100% 98% 97%  Weight:      Height:       129.7 kg  Vital signs were reviewed and unremarkable. Hypotension essentially resolved  Exam Physical Exam Constitutional:      General: He is not in acute distress.    Appearance: He is ill-appearing. He is not toxic-appearing.  Cardiovascular:     Rate and Rhythm: Normal rate and regular rhythm.     Heart sounds: No murmur heard. Pulmonary:     Effort: Pulmonary effort is normal. No respiratory distress.     Breath sounds: No wheezing, rhonchi or rales.  Neurological:     Mental Status: He is alert.  Psychiatric:     Comments: Odd affect, mood difficult to discern    Labs / Other Information My review of labs, imaging, notes and other tests is significant for    CBG stable, creatinine has trended down, 1.33, lactic acid now within normal limits, hyperkalemia resolved, hemoglobin stable 10.1  Disposition Plan: Status is: Inpatient  Remains inpatient appropriate because: Treating UTI, now status post suprapubic catheter placement.  DNR Discussed with wife at bedside  Time spent: 25 minutes [facility] 10/08/2021, 5:58 PM

## 2021-10-08 NOTE — Assessment & Plan Note (Signed)
--   CBG stable, continue Levemir, sliding scale insulin.  Can resume metformin on discharge

## 2021-10-08 NOTE — Assessment & Plan Note (Signed)
--   Placed 10/18. Can consider serial upsize in 4-6 weeks, or management as per Urology.

## 2021-10-08 NOTE — Procedures (Signed)
Interventional Radiology Procedure Note  Procedure: Ultrasound and fluoroscopic guided suprapubic catheter placement  Complications: None  Estimated Blood Loss: < 5 mL  Findings: 12 Fr drain placed in urinary bladder with return of cloudy urine. Drain attached to gravity drainage.  Plan: Keep to gravity drainage. Please record output from drain. Interventional Radiology will follow. Can consider serial upsize in 4-6 weeks, or management as per Urology.   Ruthann Cancer, MD Pager: [phone]

## 2021-10-09 LAB — GLUCOSE, CAPILLARY: Glucose-Capillary: 136 mg/dL — ABNORMAL HIGH (ref 70–99)

## 2021-10-09 LAB — HEMOGLOBIN A1C
Hgb A1c MFr Bld: 5.8 % — ABNORMAL HIGH (ref 4.8–5.6)
Mean Plasma Glucose: 120 mg/dL

## 2021-10-09 MED ORDER — FLUCONAZOLE 200 MG PO TABS: 200.0000 mg | ORAL_TABLET | Freq: Every day | ORAL | 0 refills | Status: AC

## 2021-10-09 MED ORDER — FLUCONAZOLE 100 MG PO TABS
200.0000 mg | ORAL_TABLET | Freq: Every day | ORAL | Status: DC
  Administered 2021-10-09: 200 mg via ORAL
  Filled 2021-10-09: qty 2

## 2021-10-09 MED ORDER — SENNOSIDES-DOCUSATE SODIUM 8.6-50 MG PO TABS: 1.0000 | ORAL_TABLET | Freq: Every evening | ORAL | 0 refills | Status: AC | PRN

## 2021-10-09 NOTE — Progress Notes (Signed)
Pt d/c home via [facility] transport this afternoon.  NAD noted at time of d/c.  D/c ppwk review with pt and wife, wife expressed understanding. SP cath intact 700ccs removed from drain bag. All belongings taken at time of d/c.  IVs removed from BL arms without issue.  VSS at d/c.

## 2021-10-09 NOTE — Progress Notes (Addendum)
Met with the patient and his wife in the room, The patient appears to be agitated and is cursing and sticking up his middle finger,  He is alert to self only from what is able to be assessed due to he refuses to answer any other questions His wife stated that they have a hospital bed at home, he has a 24 hour caregiver He is open with [facility] for Nursing and will need resumption of care orders He will need EMS to transport home once DC in in place, No additional needs at this time  EMS has been called for transport to the home, wife is aware

## 2021-10-09 NOTE — Discharge Summary (Signed)
Physician Discharge Summary  Norman Palmer HEN:[id number] DOB: April 18, 1941 DOA: 10/07/2021  PCP: Birdie Sons, MD  Admit date: 10/07/2021 Discharge date: 10/09/2021  Admitted From: Home Disposition: Home  Recommendations for Outpatient Follow-up:  Follow up with PCP in 1-2 weeks Follow-up with urology Please obtain BMP/CBC in one week Please follow up on the following pending results: None  Home Health: Yes Equipment/Devices: Wheelchair Discharge Condition: Stable CODE STATUS: DNR Diet recommendation: Heart Healthy / Carb Modified   Brief/Interim Summary: 80 year old man PMH recurrent UTI, bulbar urethral strictures, presented with increased aggression, altered mental status consistent with previous history of UTI, hypotension. --10/17 admitted for urethral stricture disease with difficulty Foley placement, unable to place by urology.  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  Urine cultures with 20,000 colonies of yeast, he received cefepime while in the hospital based on prior urinary cultures.  He was given 3 days of Diflucan based on his risk factor.  He will continue his home suppressive therapy with Keflex which was given to him by his PCP.  Patient is bed bound at baseline.  He was discharged to rehab after prior hospitalization but did not improve with PT.  Developed some pressure injuries so wife took him home and does not want him to go back to rehab.  Home health services ordered.  Patient will continue the rest of his home medications and follow-up with his providers.  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 You are being given 2 more days of antifungal based on your urine culture and risk factors. Continue with the rest of your medications and wound care as you are doing it at home. Keep yourself well-hydrated.   Discharge wound care:   Complete by: As directed    Silicone foam dressings to the sacrum change every 3 days. Assess under dressings each shift for any acute changes in the wounds.   Increase activity slowly   Complete by: As directed       Allergies as of 10/09/2021       Reactions   Doxycycline Rash   Zinc Rash        Medication List     STOP taking these medications    ALPRAZolam 1 MG tablet Commonly known as: XANAX       TAKE these medications    celecoxib 100 MG capsule Commonly known as: CeleBREX Take 1 capsule (100 mg total) by mouth daily. For pain   cephALEXin 250 MG capsule Commonly known as: KEFLEX TAKE 1 CAPSULE BY MOUTH EVERY DAY FOR UTI PREVENTION   divalproex 125 MG DR tablet Commonly known as: DEPAKOTE Take 1 tablet by mouth in the morning and at bedtime.   donepezil 10 MG tablet Commonly known  as: ARICEPT Take 10 mg by mouth at bedtime.   dutasteride 0.5 MG capsule Commonly known as: AVODART Take 1 capsule by mouth at bedtime.   finasteride 5 MG tablet Commonly known as: PROSCAR Take 1 tablet (5 mg total) by mouth daily.   fluconazole 200 MG tablet Commonly known as: DIFLUCAN Take 1 tablet (200 mg total) by mouth daily for 2 days. Start taking on: October 10, 2021   gabapentin 300 MG capsule Commonly known as: NEURONTIN Take  1 capsule (300 mg total) by mouth 2 (two) times daily.   insulin NPH Human 100 UNIT/ML injection Commonly known as: NOVOLIN N Inject 22 Units into the skin daily.   lisinopril 20 MG tablet Commonly known as: ZESTRIL TAKE 1 TABLET BY MOUTH DAILY   memantine 10 MG tablet Commonly known as: NAMENDA Take 1 tablet (10 mg total) by mouth 2 (two) times daily.   metFORMIN 1000 MG tablet Commonly known as: GLUCOPHAGE TAKE ONE TABLET TWICE DAILY WITH MEALS What changed: See the new instructions.   risperiDONE 0.5 MG tablet Commonly known as: RISPERDAL TAKE 1 TABLET BY MOUTH AT BEDTIME   senna-docusate 8.6-50 MG tablet Commonly known as: Senokot-S Take 1 tablet by mouth at bedtime as needed for mild constipation.   sertraline 50 MG tablet Commonly known as: ZOLOFT Take 50 mg by mouth daily.   silodosin 8 MG Caps capsule Commonly known as: RAPAFLO Take 1 capsule (8 mg total) by mouth daily with breakfast.   silver sulfADIAZINE 1 % cream Commonly known as: SILVADENE Apply 1 application topically daily.   traMADol 50 MG tablet Commonly known as: ULTRAM TAKE ONE TABLET EVERY 8 HOURS AS NEEDED   triamterene-hydrochlorothiazide 37.5-25 MG capsule Commonly known as: DYAZIDE TAKE 1 CAPSULE BY MOUTH EACH MORNING   Vitamin D (Ergocalciferol) 1.25 MG (50000 UNIT) Caps capsule Commonly known as: DRISDOL TAKE 1 CAPSULE BY MOUTH ONCE A WEEK What changed:  how much to take how to take this when to take this additional instructions   zinc oxide 20 % ointment Apply 1 application topically as needed for irritation. Apply to affected area daily               Discharge Care Instructions  (From admission, onward)           Start     Ordered   10/09/21 0000  Discharge wound care:       Comments: Silicone foam dressings to the sacrum change every 3 days. Assess under dressings each shift for any acute changes in the wounds.   10/09/21 1039            Follow-up  Information     Birdie Sons, MD. Schedule an appointment as soon as possible for a visit in 1 week(s).   Specialty: Family Medicine Contact information: [address] [place] [address] [phone]                Allergies  Allergen Reactions   Doxycycline Rash   Zinc Rash    Consultations: IR Urology  Procedures/Studies: DG Chest 2 View  Result Date: 10/07/2021 CLINICAL DATA:  Possible sepsis EXAM: CHEST - 2 VIEW COMPARISON:  Chest radiograph 03/25/2021 FINDINGS: The heart is borderline enlarged, unchanged. The mediastinal contours are stable. Lung volumes are low. Linear opacities in the lung bases likely reflect subsegmental atelectasis. There is no focal consolidation or pulmonary edema. There is no pleural effusion or pneumothorax. There is multilevel degenerative change throughout the  imaged spine. There is no acute osseous abnormality. IMPRESSION: Borderline cardiomegaly and low lung volumes with bibasilar atelectasis. Electronically Signed   By: Valetta Mole M.D.   On: 10/07/2021 16:07   IR Guided Niel Hummer W Catheter Placement  Result Date: 10/08/2021 INDICATION: 80 year old male with dementia, partial bladder outlet obstruction, and recurrent urinary tract infections, currently admitted with urosepsis. EXAM: Fluoroscopic and ultrasound-guided placement of suprapubic catheter. COMPARISON:  None. MEDICATIONS: None ANESTHESIA/SEDATION: Moderate (conscious) sedation was employed during this procedure. A total of Versed 0 mg and Fentanyl 25 mcg was administered intravenously. Moderate Sedation Time: 20 minutes. The patient's level of consciousness and vital signs were monitored continuously by radiology nursing throughout the procedure under my direct supervision. CONTRAST:  8 mL Omnipaque 350, administered into the urinary bladder FLUOROSCOPY TIME:  24 seconds, 6.5 mGy COMPLICATIONS: None immediate. PROCEDURE: The procedure, risks, benefits, and  alternatives were explained to the patient. Questions regarding the procedure were encouraged and answered. The patient understands and consents to the procedure. A timeout was performed prior to the initiation of the procedure. The patient was positioned supine. The skin overlying the anterior aspect the lower pelvis was prepped and draped in usual sterile fashion. Under direct ultrasound guidance, a 22 gauge needle was utilized for the purposes of procedural planning after the overlying soft tissues were anesthetized with 1% lidocaine. Next, an 69 gauge trocar needle was advanced into the urinary bladder under direct ultrasound guidance. Ultrasound image was saved for procedural documentation purposes. A short Amplatz wire was coiled within the urinary bladder. Appropriate position was confirmed fluoroscopically. The track was serially dilated ultimately allowing placement of a 12 French all-purpose drainage catheter with end coiled and locked within the urinary bladder. The catheter was connected to a gravity bag. The exit site of the catheter was secured within interrupted suture. A dressing was placed. The patient tolerated the procedure well without immediate postprocedural complication. FINDINGS: Ultrasound and fluoroscopy were utilized for the placement of a 12 French percutaneous drainage catheter with end coiled and locked within the urinary bladder. IMPRESSION: Technically successful ultrasound and fluoroscopic guided placement of a 12 French suprapubic catheter. PLAN: Recommend fluoroscopic guided exchange and up sizing of this suprapubic drainage catheter in approximately 6-8 weeks, otherwise management as per Urology. Ruthann Cancer, MD [facility] Radiology Electronically Signed   By: Ruthann Cancer M.D.   On: 10/08/2021 11:35    Subjective: Patient was seen and examined today.  Wife at bedside.  At baseline patient is bed and wheelchair bound.  Wife  have 24-hour care available at home.  She was requesting to add PT and OT to the home health orders as her goal for him to able to turn and transfer himself to wheelchair. No other complaints.  Discharge Exam: Vitals:   10/09/21 0424 10/09/21 0801  BP: (!) 133/54 (!) 116/53  Pulse: 77 73  Resp: 16 16  Temp: 98.7 F (37.1 C) 98.5 F (36.9 C)  SpO2: 96% 97%   Vitals:   10/08/21 2022 10/09/21 0424 10/09/21 0516 10/09/21 0801  BP: (!) 120/48 (!) 133/54  (!) 116/53  Pulse: 67 77  73  Resp: 17 16  16   Temp: 98.3 F (36.8 C) 98.7 F (37.1 C)  98.5 F (36.9 C)  TempSrc: Oral Oral  Oral  SpO2: 95% 96%  97%  Weight:   112.2 kg   Height:        General: Pt is alert, awake, not in acute distress  Cardiovascular: RRR, S1/S2 +, no rubs, no gallops Respiratory: CTA bilaterally, no wheezing, no rhonchi Abdominal: Soft, NT, ND, bowel sounds + Extremities: no edema, no cyanosis   The results of significant diagnostics from this hospitalization (including imaging, microbiology, ancillary and laboratory) are listed below for reference.    Microbiology: Recent Results (from the past 240 hour(s))  Blood culture (single)     Status: None (Preliminary result)   Collection Time: 10/07/21  2:57 PM   Specimen: BLOOD  Result Value Ref Range Status   Specimen Description   Final    BLOOD RIGHT ANTECUBITAL Performed at [facility], [address], [place], [place] [address]    Special Requests   Final    BOTTLES DRAWN AEROBIC AND ANAEROBIC Blood Culture results may not be optimal due to an excessive volume of blood received in culture bottles Performed at [facility], [address], [place], [place] [address]    Culture   Final    NO GROWTH 2 DAYS Performed at [facility], [place] [address] [place],  [address]    Report Status PENDING  Incomplete  Resp Panel by RT-PCR (Flu A&B, Covid) Nasopharyngeal Swab     Status: None   Collection Time: 10/07/21  4:20  PM   Specimen: Nasopharyngeal Swab; Nasopharyngeal(NP) swabs in vial transport medium  Result Value Ref Range Status   SARS Coronavirus 2 by RT PCR NEGATIVE NEGATIVE Final    Comment: (NOTE) SARS-CoV-2 target nucleic acids are NOT DETECTED.  The SARS-CoV-2 RNA is generally detectable in upper respiratory specimens during the acute phase of infection. The lowest concentration of SARS-CoV-2 viral copies this assay can detect is 138 copies/mL. A negative result does not preclude SARS-Cov-2 infection and should not be used as the sole basis for treatment or other patient management decisions. A negative result may occur with  improper specimen collection/handling, submission of specimen other than nasopharyngeal swab, presence of viral mutation(s) within the areas targeted by this assay, and inadequate number of viral copies(<138 copies/mL). A negative result must be combined with clinical observations, patient history, and epidemiological information. The expected result is Negative.  Fact Sheet for Patients:  EntrepreneurPulse.com.au  Fact Sheet for Healthcare Providers:  IncredibleEmployment.be  This test is no t yet approved or cleared by the [place] FDA and  has been authorized for detection and/or diagnosis of SARS-CoV-2 by FDA under an Emergency Use Authorization (EUA). This EUA will remain  in effect (meaning this test can be used) for the duration of the COVID-19 declaration under Section 564(b)(1) of the Act, 21 U.S.C.section 360bbb-3(b)(1), unless the authorization is terminated  or revoked sooner.       Influenza A by PCR NEGATIVE NEGATIVE Final   Influenza B by PCR NEGATIVE NEGATIVE Final    Comment: (NOTE) The Xpert Xpress SARS-CoV-2/FLU/RSV plus assay is intended as an aid in the diagnosis of influenza from Nasopharyngeal swab specimens and should not be used as a sole basis for treatment. Nasal washings and aspirates are  unacceptable for Xpert Xpress SARS-CoV-2/FLU/RSV testing.  Fact Sheet for Patients: EntrepreneurPulse.com.au  Fact Sheet for Healthcare Providers: IncredibleEmployment.be  This test is not yet approved or cleared by the [place] FDA and has been authorized for detection and/or diagnosis of SARS-CoV-2 by FDA under an Emergency Use Authorization (EUA). This EUA will remain in effect (meaning this test can be used) for the duration of the COVID-19 declaration under Section 564(b)(1) of the Act, 21 U.S.C. section 360bbb-3(b)(1),  unless the authorization is terminated or revoked.  Performed at [facility], [address] [place], [place] [address]   Urine Culture     Status: Abnormal   Collection Time: 10/07/21  4:20 PM   Specimen: Urine, Random  Result Value Ref Range Status   Specimen Description   Final    URINE, RANDOM Performed at [facility], [address] [place], [place] [address]    Special Requests   Final    NONE Performed at [facility], [place], [place] [address]    Culture 20,000 COLONIES/mL YEAST (A)  Final   Report Status 10/08/2021 FINAL  Final     Labs: BNP (last 3 results) No results for input(s): BNP in the last 8760 hours. Basic Metabolic Panel: Recent Labs  Lab 10/07/21 1456 10/07/21 2217 10/08/21 0012  NA 134* 133* 132*  K 5.5* 4.9 4.8  CL 99 102 104  CO2 22 24 20*  GLUCOSE 95 117* 132*  BUN 68* 66* 57*  CREATININE 1.93* 1.56* 1.33*  CALCIUM 9.2 8.5* 8.4*  PHOS  --  4.3 4.0   Liver Function Tests: Recent Labs  Lab 10/07/21 1456 10/07/21 2217 10/08/21 0012  AST 23  --   --   ALT 14  --   --   ALKPHOS 53  --   --   BILITOT 0.3  --   --   PROT 6.7  --   --   ALBUMIN 3.1* 3.1* 2.8*   No results for input(s): LIPASE, AMYLASE in the last 168 hours. No results for input(s): AMMONIA in the last 168 hours. CBC: Recent Labs  Lab 10/07/21 1456  10/08/21 0012  WBC 11.3* 10.4  NEUTROABS 7.0  --   HGB 11.4* 10.1*  HCT 35.0* 30.7*  MCV 95.4 95.0  PLT 339 282   Cardiac Enzymes: No results for input(s): CKTOTAL, CKMB, CKMBINDEX, TROPONINI in the last 168 hours. BNP: Invalid input(s): POCBNP CBG: Recent Labs  Lab 10/08/21 0914 10/08/21 1121 10/08/21 1627 10/08/21 2019 10/09/21 0800  GLUCAP 107* 141* 118* 172* 136*   D-Dimer No results for input(s): DDIMER in the last 72 hours. Hgb A1c Recent Labs    10/07/21 2217  HGBA1C 5.8*   Lipid Profile No results for input(s): CHOL, HDL, LDLCALC, TRIG, CHOLHDL, LDLDIRECT in the last 72 hours. Thyroid function studies No results for input(s): TSH, T4TOTAL, T3FREE, THYROIDAB in the last 72 hours.  Invalid input(s): FREET3 Anemia work up No results for input(s): VITAMINB12, FOLATE, FERRITIN, TIBC, IRON, RETICCTPCT in the last 72 hours. Urinalysis    Component Value Date/Time   COLORURINE YELLOW (A) 10/07/2021 1620   APPEARANCEUR CLOUDY (A) 10/07/2021 1620   APPEARANCEUR Clear 06/11/2021 1101   LABSPEC 1.011 10/07/2021 1620   PHURINE 5.0 10/07/2021 1620   GLUCOSEU NEGATIVE 10/07/2021 1620   HGBUR SMALL (A) 10/07/2021 1620   BILIRUBINUR NEGATIVE 10/07/2021 1620   BILIRUBINUR Negative 06/11/2021 1101   KETONESUR NEGATIVE 10/07/2021 1620   PROTEINUR 30 (A) 10/07/2021 1620   UROBILINOGEN 0.2 10/13/2017 1126   NITRITE NEGATIVE 10/07/2021 1620   LEUKOCYTESUR LARGE (A) 10/07/2021 1620   Sepsis Labs Invalid input(s): PROCALCITONIN,  WBC,  LACTICIDVEN Microbiology Recent Results (from the past 240 hour(s))  Blood culture (single)     Status: None (Preliminary result)   Collection Time: 10/07/21  2:57 PM   Specimen: BLOOD  Result Value Ref Range Status   Specimen Description   Final    BLOOD RIGHT  ANTECUBITAL Performed at [facility], [address] [place], [place] [address]    Special Requests   Final    BOTTLES DRAWN AEROBIC AND ANAEROBIC Blood Culture  results may not be optimal due to an excessive volume of blood received in culture bottles Performed at [facility], [address], [place], [place] [address]    Culture   Final    NO GROWTH 2 DAYS Performed at [facility] [address] [place], [place] [address]    Report Status PENDING  Incomplete  Resp Panel by RT-PCR (Flu A&B, Covid) Nasopharyngeal Swab     Status: None   Collection Time: 10/07/21  4:20 PM   Specimen: Nasopharyngeal Swab; Nasopharyngeal(NP) swabs in vial transport medium  Result Value Ref Range Status   SARS Coronavirus 2 by RT PCR NEGATIVE NEGATIVE Final    Comment: (NOTE) SARS-CoV-2 target nucleic acids are NOT DETECTED.  The SARS-CoV-2 RNA is generally detectable in upper respiratory specimens during the acute phase of infection. The lowest concentration of SARS-CoV-2 viral copies this assay can detect is 138 copies/mL. A negative result does not preclude SARS-Cov-2 infection and should not be used as the sole basis for treatment or other patient management decisions. A negative result may occur with  improper specimen collection/handling, submission of specimen other than nasopharyngeal swab, presence of viral mutation(s) within the areas targeted by this assay, and inadequate number of viral copies(<138 copies/mL). A negative result must be combined with clinical observations, patient history, and epidemiological information. The expected result is Negative.  Fact Sheet for Patients:  EntrepreneurPulse.com.au  Fact Sheet for Healthcare Providers:  IncredibleEmployment.be  This test is no t yet approved or cleared by the [place] FDA and  has been authorized for detection and/or diagnosis of SARS-CoV-2 by FDA under an Emergency Use Authorization (EUA). This EUA will remain  in effect (meaning this test can be used) for the duration of the COVID-19 declaration under Section 564(b)(1) of the  Act, 21 U.S.C.section 360bbb-3(b)(1), unless the authorization is terminated  or revoked sooner.       Influenza A by PCR NEGATIVE NEGATIVE Final   Influenza B by PCR NEGATIVE NEGATIVE Final    Comment: (NOTE) The Xpert Xpress SARS-CoV-2/FLU/RSV plus assay is intended as an aid in the diagnosis of influenza from Nasopharyngeal swab specimens and should not be used as a sole basis for treatment. Nasal washings and aspirates are unacceptable for Xpert Xpress SARS-CoV-2/FLU/RSV testing.  Fact Sheet for Patients: EntrepreneurPulse.com.au  Fact Sheet for Healthcare Providers: IncredibleEmployment.be  This test is not yet approved or cleared by the [place] FDA and has been authorized for detection and/or diagnosis of SARS-CoV-2 by FDA under an Emergency Use Authorization (EUA). This EUA will remain in effect (meaning this test can be used) for the duration of the COVID-19 declaration under Section 564(b)(1) of the Act, 21 U.S.C. section 360bbb-3(b)(1), unless the authorization is terminated or revoked.  Performed at [facility], [address] [place], [place] [address]   Urine Culture     Status: Abnormal   Collection Time: 10/07/21  4:20 PM   Specimen: Urine, Random  Result Value Ref Range Status   Specimen Description   Final    URINE, RANDOM Performed at [facility], [address] [place], [place] [address]    Special Requests   Final    NONE Performed at [facility], [address] [place], [place] [address]    Culture  20,000 COLONIES/mL YEAST (A)  Final   Report Status 10/08/2021 FINAL  Final    Time coordinating discharge: Over 30 minutes  SIGNED:  Lorella Nimrod, MD  [facility] 10/09/2021, 10:41 AM  If 7PM-7AM, please contact night-coverage [email or web address]  This record has been created using Systems analyst. Errors have been sought and corrected,but may not  always be located. Such creation errors do not reflect on the standard of care.

## 2021-10-09 NOTE — Evaluation (Signed)
Physical Therapy Evaluation Patient Details Name: Norman Palmer MRN: [id number] DOB: 1941-06-14 Today's Date: 10/09/2021  History of Present Illness  Pt is a 80 yo male admitted to ED due to suspect UTI, at home has been experiencing increased weakness, falls, and requiring more assist as well as increased confusion. Previously able to ambulate. PMH of dementia, DM, thyroid disease.    Clinical Impression  Pt received in supine position.  Pt unwilling to participate in bed mobility, however did participate in limited bed-level exercises with LE's.  Pt's wife states that her goal is to have home health Pt to come and assist with bed mobility.  Pt declined performing bed mobility at this session, but likely due to agitation and medications not performing yet.  Pt will benefit from skilled PT intervention to increase independence and safety with basic mobility in preparation for discharge to the venue listed below.        Recommendations for follow up therapy are one component of a multi-disciplinary discharge planning process, led by the attending physician.  Recommendations may be updated based on patient status, additional functional criteria and insurance authorization.  Follow Up Recommendations Home health PT    Equipment Recommendations  None recommended by PT    Recommendations for Other Services       Precautions / Restrictions Precautions Precautions: Fall Restrictions Weight Bearing Restrictions: No      Mobility  Bed Mobility               General bed mobility comments: Pt not cooperative with bed mobility at this time.    Transfers                    Ambulation/Gait                Stairs            Wheelchair Mobility    Modified Rankin (Stroke Patients Only)       Balance                                             Pertinent Vitals/Pain Pain Assessment: Faces Faces Pain Scale: Hurts whole lot Pain  Location: B Knee and Back Pain Descriptors / Indicators: Aching Pain Intervention(s): Limited activity within patient's tolerance;Monitored during session    [place] expects to be discharged to:: Private residence Living Arrangements: Spouse/significant other Available Help at Discharge: Family;Available 24 hours/day;Personal care attendant Type of Home: Mobile home Home Access: Stairs to enter Entrance Stairs-Rails: None Entrance Stairs-Number of Steps: 1 Home Layout: One level;Laundry or work area in basement;Able to live on main level with bedroom/bathroom Home Equipment: Wheelchair - [employer] - 2 wheels;Tub bench;Cane - single point;Grab bars - tub/shower;Grab bars - toilet      Prior Function Level of Independence: Needs assistance   Gait / Transfers Assistance Needed: Pt needs assistance with general bed mobility, and is unable to stand without +2 maxA.  ADL's / Homemaking Assistance Needed: Pt's wife provides assistance for LB dressing at baseline, as well as assistance for household maintenance and medication management.  Mentation has been an issue with current UTI and sacral wound.        Hand Dominance   Dominant Hand: Right    Extremity/Trunk Assessment   Upper Extremity Assessment Upper Extremity Assessment: Generalized weakness    Lower Extremity Assessment  Lower Extremity Assessment: Generalized weakness       Communication   Communication: Other (comment)  Cognition Arousal/Alertness: Awake/alert Behavior During Therapy: Agitated;Impulsive Overall Cognitive Status: History of cognitive impairments - at baseline                                 General Comments: Wife reports pt is usually "mean" in the mornings and at night, specifically with current UTI.      General Comments General comments (skin integrity, edema, etc.): deferred due to safety concerns.    Exercises Other Exercises Other Exercises: Pt and  spouse educated on roles of PT and services provided during hospital stay.  Wife requesting [place] services and educated her on the process of performing.   Assessment/Plan    PT Assessment All further PT needs can be met in the next venue of care  PT Problem List Decreased strength;Decreased range of motion;Decreased activity tolerance;Decreased balance;Decreased mobility;Obesity;Pain       PT Treatment Interventions      PT Goals (Current goals can be found in the Care Plan section)  Acute Rehab PT Goals Patient Stated Goal: to go home and work on rolling over in bed. PT Goal Formulation: With patient/family Time For Goal Achievement: 10/23/21 Potential to Achieve Goals: Good    Frequency     Barriers to discharge        Co-evaluation               AM-PAC PT "6 Clicks" Mobility  Outcome Measure Help needed turning from your back to your side while in a flat bed without using bedrails?: A Lot Help needed moving from lying on your back to sitting on the side of a flat bed without using bedrails?: A Lot Help needed moving to and from a bed to a chair (including a wheelchair)?: Total Help needed standing up from a chair using your arms (e.g., wheelchair or bedside chair)?: Total Help needed to walk in hospital room?: Total Help needed climbing 3-5 steps with a railing? : Total 6 Click Score: 8    End of Session   Activity Tolerance: Treatment limited secondary to agitation Patient left: in bed;with call bell/phone within reach;with bed alarm set;with family/visitor present   PT Visit Diagnosis: Unsteadiness on feet (R26.81);Repeated falls (R29.6);Muscle weakness (generalized) (M62.81);Difficulty in walking, not elsewhere classified (R26.2)    Time: 1007-1040 PT Time Calculation (min) (ACUTE ONLY): 33 min   Charges:   PT Evaluation $PT Eval Low Complexity: 1 Low PT Treatments $Therapeutic Exercise: 8-22 mins        Gwenlyn Saran, PT, DPT 10/09/21, 11:24  AM   Norman Palmer 10/09/2021, 11:10 AM

## 2021-10-10 ENCOUNTER — Telehealth: Payer: Self-pay

## 2021-10-10 NOTE — Telephone Encounter (Signed)
Copied from [place] [id number]. Topic: Appointment Scheduling - Scheduling Inquiry for Clinic >> Oct 10, 2021  2:14 PM Valere Dross wrote: Reason for CRM: Pts wife called in stating pt just got out of the hospital for UTI, and the ER doc told the pt he needs blood work and a follow up appt as soon as possible, to get checked out, please advise.

## 2021-10-10 NOTE — Telephone Encounter (Signed)
Please review.  Can we work him in somewhere?  Thanks,   -Mickel Baas

## 2021-10-11 ENCOUNTER — Encounter: Payer: Self-pay | Admitting: Family Medicine

## 2021-10-11 DIAGNOSIS — N390 Urinary tract infection, site not specified: Secondary | ICD-10-CM

## 2021-10-11 NOTE — Telephone Encounter (Signed)
I put him on my schedule Monday October 24th at 10:00am

## 2021-10-11 NOTE — Telephone Encounter (Addendum)
Left message advising Mrs. Blumenberg.   Thanks,   -Mickel Baas

## 2021-10-13 LAB — CULTURE, BLOOD (SINGLE): Culture: NO GROWTH

## 2021-10-14 ENCOUNTER — Inpatient Hospital Stay: Payer: Medicare Other | Admitting: Family Medicine

## 2021-10-14 ENCOUNTER — Telehealth: Payer: Self-pay | Admitting: Family Medicine

## 2021-10-14 DIAGNOSIS — R339 Retention of urine, unspecified: Secondary | ICD-10-CM | POA: Diagnosis not present

## 2021-10-14 DIAGNOSIS — N401 Enlarged prostate with lower urinary tract symptoms: Secondary | ICD-10-CM | POA: Diagnosis not present

## 2021-10-14 DIAGNOSIS — L89323 Pressure ulcer of left buttock, stage 3: Secondary | ICD-10-CM | POA: Diagnosis not present

## 2021-10-14 DIAGNOSIS — E1140 Type 2 diabetes mellitus with diabetic neuropathy, unspecified: Secondary | ICD-10-CM | POA: Diagnosis not present

## 2021-10-14 DIAGNOSIS — N390 Urinary tract infection, site not specified: Secondary | ICD-10-CM | POA: Diagnosis not present

## 2021-10-14 DIAGNOSIS — N183 Chronic kidney disease, stage 3 unspecified: Secondary | ICD-10-CM | POA: Diagnosis not present

## 2021-10-14 DIAGNOSIS — F03911 Unspecified dementia, unspecified severity, with agitation: Secondary | ICD-10-CM | POA: Diagnosis not present

## 2021-10-14 DIAGNOSIS — Z794 Long term (current) use of insulin: Secondary | ICD-10-CM | POA: Diagnosis not present

## 2021-10-14 DIAGNOSIS — F329 Major depressive disorder, single episode, unspecified: Secondary | ICD-10-CM | POA: Diagnosis not present

## 2021-10-14 DIAGNOSIS — Z466 Encounter for fitting and adjustment of urinary device: Secondary | ICD-10-CM | POA: Diagnosis not present

## 2021-10-14 DIAGNOSIS — Z7984 Long term (current) use of oral hypoglycemic drugs: Secondary | ICD-10-CM | POA: Diagnosis not present

## 2021-10-14 DIAGNOSIS — I129 Hypertensive chronic kidney disease with stage 1 through stage 4 chronic kidney disease, or unspecified chronic kidney disease: Secondary | ICD-10-CM | POA: Diagnosis not present

## 2021-10-14 DIAGNOSIS — R262 Difficulty in walking, not elsewhere classified: Secondary | ICD-10-CM | POA: Diagnosis not present

## 2021-10-14 NOTE — Telephone Encounter (Signed)
Copied from [place] [id number]. Topic: Quick Communication - Home Health Verbal Orders >> Oct 14, 2021  2:58 PM Leward Quan A wrote: Caller/Agency: Jobe Gibbon / [facility] Callback Number: [phone] [place] [facility]  Requesting OT/PT/Skilled Nursing/Social Work/Speech Therapy: PT  Frequency: 2 w 3 and 1 w 1

## 2021-10-14 NOTE — Progress Notes (Deleted)
Established patient visit   Patient: Norman Palmer   DOB: [birth year]   80 y.o. Male  MRN: [id number] Visit Date: 10/14/2021  Today's healthcare provider: Lelon Huh, MD   No chief complaint on file.  Subjective    HPI  Follow up Hospitalization  Patient was admitted to [facility] on 10/07/2021 and discharged on 10/09/2021. He was treated for lower UTI, pressure ulcer. Treatment for this included:  --10/17 admitted for urethral stricture disease with difficulty Foley placement, unable to place by urology.  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  Urine cultures with 20,000 colonies of yeast, he received cefepime while in the hospital based on prior urinary cultures.  He was given 3 days of Diflucan based on his risk factor.  He will continue his home suppressive therapy with Keflex which was given to him by his PCP.  Telephone follow up was done on N/A He reports {excellent/good/fair:19992} compliance with treatment. He reports this condition is {resolved/improved/worsened:23923}.   Recommendations for Outpatient Follow-up:  Follow up with PCP in 1-2 weeks Follow-up with urology Please obtain BMP/CBC in one week Please follow up on the following pending results: None  ----------------------------------------------------------------------------------------- -     Medications: Outpatient Medications Prior to Visit  Medication Sig   celecoxib (CELEBREX) 100 MG capsule Take 1 capsule (100 mg total) by mouth daily. For pain   cephALEXin (KEFLEX) 250 MG capsule TAKE 1 CAPSULE BY MOUTH EVERY DAY FOR UTI PREVENTION   divalproex (DEPAKOTE) 125 MG DR tablet Take 1 tablet by mouth in the morning and at bedtime.   donepezil (ARICEPT) 10 MG tablet Take 10 mg by mouth at bedtime.    dutasteride (AVODART) 0.5 MG  capsule Take 1 capsule by mouth at bedtime.   finasteride (PROSCAR) 5 MG tablet Take 1 tablet (5 mg total) by mouth daily.   gabapentin (NEURONTIN) 300 MG capsule Take 1 capsule (300 mg total) by mouth 2 (two) times daily.   insulin NPH Human (NOVOLIN N) 100 UNIT/ML injection Inject 22 Units into the skin daily.   lisinopril (ZESTRIL) 20 MG tablet TAKE 1 TABLET BY MOUTH DAILY   memantine (NAMENDA) 10 MG tablet Take 1 tablet (10 mg total) by mouth 2 (two) times daily.   metFORMIN (GLUCOPHAGE) 1000 MG tablet TAKE ONE TABLET TWICE DAILY WITH MEALS (Patient taking differently: Take 1,000 mg by mouth 2 (two) times daily with a meal.)   risperiDONE (RISPERDAL) 0.5 MG tablet TAKE 1 TABLET BY MOUTH AT BEDTIME   senna-docusate (SENOKOT-S) 8.6-50 MG tablet Take 1 tablet by mouth at bedtime as needed for mild constipation.   sertraline (ZOLOFT) 50 MG tablet Take 50 mg by mouth daily.   silodosin (RAPAFLO) 8 MG CAPS capsule Take 1 capsule (8 mg total) by mouth daily with breakfast.   silver sulfADIAZINE (SILVADENE) 1 % cream Apply 1 application topically daily.   traMADol (ULTRAM) 50 MG tablet TAKE ONE TABLET EVERY 8 HOURS AS NEEDED   triamterene-hydrochlorothiazide (DYAZIDE) 37.5-25 MG capsule TAKE 1 CAPSULE BY MOUTH EACH MORNING   Vitamin D, Ergocalciferol, (DRISDOL) 1.25 MG (50000 UNIT) CAPS capsule TAKE 1 CAPSULE BY MOUTH ONCE A WEEK (Patient taking differently: wednesday)   zinc oxide 20 % ointment Apply 1 application topically as needed for irritation. Apply to affected area daily   No facility-administered medications prior to visit.    Review of  Systems     Objective    There were no vitals taken for this visit.   Physical Exam  ***  No results found for any visits on 10/14/21.  Assessment & Plan     ***  No follow-ups on file.      {provider attestation***:1}   Lelon Huh, MD  [facility] [phone] (phone) [phone] (fax)  [place]

## 2021-10-14 NOTE — Telephone Encounter (Signed)
I called [facility] and gave verbal order below.

## 2021-10-14 NOTE — Telephone Encounter (Signed)
That's fine

## 2021-10-15 ENCOUNTER — Telehealth: Payer: Self-pay

## 2021-10-15 DIAGNOSIS — R339 Retention of urine, unspecified: Secondary | ICD-10-CM

## 2021-10-15 NOTE — Telephone Encounter (Signed)
IR states patient is scheduled on 11/07/21@ 10a and to arrive at [place].  Left patient a detailed message with notification of appointment with request to call back Mychart notification also sent Appointment with Sam for following exchange was made on 12/15

## 2021-10-15 NOTE — Telephone Encounter (Signed)
-----   Message from Billey Co, MD sent at 10/10/2021 10:33 AM EDT ----- Regarding: SP tube upsizing Please facilitate SP tueb upsizing with IR in 4 weeks, followed by exchange with [facility] in 8 weeks, thanks  Nickolas Madrid, MD 10/10/2021

## 2021-10-15 NOTE — Telephone Encounter (Signed)
Order placed and message sent to IR scheduling for tube exchange around 11/06/21. Awaiting response

## 2021-10-15 NOTE — Telephone Encounter (Signed)
Transition Care Management Unsuccessful Follow-up Telephone Call  Date of discharge and from where:  10/09/21 from [facility]  Attempts:  RNCM received message requesting a return call from wife: Santiago Glad Strike/designated party release. RNCM returned call. No answer-Voice message left providing RNCM's contact information.  Thea Silversmith, RN, MSN, BSN, [place] Care Management Coordinator [phone]

## 2021-10-15 NOTE — Telephone Encounter (Signed)
Transition Care Management Follow-up Telephone Call Date of discharge and from where: 10/09/21 from [facility] How have you been since you were released from the hospital? "He is doing ok Any questions or concerns? No  Items Reviewed: Did the pt receive and understand the discharge instructions provided? Yes  Medications obtained and verified? Yes  Other?  Wife reports home health agency came out and provided instructions on care of suprapubic catheter. Any new allergies since your discharge? No  Dietary orders reviewed? Yes Do you have support at home? Yes   Home Care and Equipment/Supplies: Were home health services ordered? yes If so, what is the name of the agency? [facility]  Has the agency set up a time to come to the patient's home? yes Were any new equipment or medical supplies ordered?  No What is the name of the medical supply agency? Not applicable Were you able to get the supplies/equipment? not applicable Do you have any questions related to the use of the equipment or supplies? No  Functional Questionnaire: (I = Independent and D = Dependent) ADLs: D  Bathing/Dressing- D  Meal Prep- D  Eating- Assistance  Maintaining continence- suprapubic catheter  Transferring/Ambulation- D  Managing Meds- D  Follow up appointments reviewed:  PCP Hospital f/u appt confirmed? Yes  Scheduled to see Dr. Lelon Huh on 10/18/21 @ 1:00 pm. [facility] f/u appt confirmed? Yes  Scheduled to see Jeri Cos, PA-C (wound care clinic) @ 10:00 am.. Are transportation arrangements needed?  Per wife uses transportation per ambulance service. If their condition worsens, is the pt aware to call PCP or go to the Emergency Dept.? Yes Was the patient provided with contact information for the PCP's office or ED? Yes Was to pt encouraged to call back with questions or concerns? Yes   Thea Silversmith, RN, MSN, BSN, [place] Care Management Coordinator [phone]

## 2021-10-15 NOTE — Telephone Encounter (Addendum)
Caller name: Tymir, Terral Relation to pt: spouse  Call back number: [phone]    Reason for call:  Returning call and would like a follow up call

## 2021-10-16 ENCOUNTER — Encounter (INDEPENDENT_AMBULATORY_CARE_PROVIDER_SITE_OTHER): Payer: Medicare Other | Admitting: Family Medicine

## 2021-10-16 DIAGNOSIS — L89323 Pressure ulcer of left buttock, stage 3: Secondary | ICD-10-CM | POA: Diagnosis not present

## 2021-10-16 DIAGNOSIS — R262 Difficulty in walking, not elsewhere classified: Secondary | ICD-10-CM

## 2021-10-16 DIAGNOSIS — F03911 Unspecified dementia, unspecified severity, with agitation: Secondary | ICD-10-CM

## 2021-10-16 DIAGNOSIS — Z794 Long term (current) use of insulin: Secondary | ICD-10-CM | POA: Diagnosis not present

## 2021-10-16 DIAGNOSIS — N401 Enlarged prostate with lower urinary tract symptoms: Secondary | ICD-10-CM

## 2021-10-16 DIAGNOSIS — I129 Hypertensive chronic kidney disease with stage 1 through stage 4 chronic kidney disease, or unspecified chronic kidney disease: Secondary | ICD-10-CM

## 2021-10-16 DIAGNOSIS — R339 Retention of urine, unspecified: Secondary | ICD-10-CM

## 2021-10-16 DIAGNOSIS — N390 Urinary tract infection, site not specified: Secondary | ICD-10-CM | POA: Diagnosis not present

## 2021-10-16 DIAGNOSIS — N183 Chronic kidney disease, stage 3 unspecified: Secondary | ICD-10-CM

## 2021-10-16 DIAGNOSIS — E1140 Type 2 diabetes mellitus with diabetic neuropathy, unspecified: Secondary | ICD-10-CM | POA: Diagnosis not present

## 2021-10-16 NOTE — Telephone Encounter (Signed)
I called patient's home health nurse [place] at [phone] and advised her that Dr. Caryn Section would like a CBC, Met C, Urinalysis and urine culture ordered. Sonja request that we fax an order to [facility] home [facility]. I wrote orders and had Dr. Caryn Section sign them. Order has been faxed to [facility] home health (fax number [phone]). Sonja plans to draw labs and collect urine sample this Friday 10/18/2021 which is her next scheduled visit to patient's home.

## 2021-10-16 NOTE — Telephone Encounter (Signed)
Can you check with his home health nurse and send order for CBC, Met C and urinalysis. Thanks!

## 2021-10-18 ENCOUNTER — Telehealth (INDEPENDENT_AMBULATORY_CARE_PROVIDER_SITE_OTHER): Payer: Medicare Other | Admitting: Family Medicine

## 2021-10-18 DIAGNOSIS — G9009 Other idiopathic peripheral autonomic neuropathy: Secondary | ICD-10-CM | POA: Diagnosis not present

## 2021-10-18 DIAGNOSIS — F03918 Unspecified dementia, unspecified severity, with other behavioral disturbance: Secondary | ICD-10-CM | POA: Diagnosis not present

## 2021-10-18 DIAGNOSIS — H00019 Hordeolum externum unspecified eye, unspecified eyelid: Secondary | ICD-10-CM | POA: Diagnosis not present

## 2021-10-18 MED ORDER — CIPROFLOXACIN HCL 0.3 % OP SOLN: 1.0000 [drp] | OPHTHALMIC | 0 refills | Status: AC

## 2021-10-18 MED ORDER — TRAMADOL HCL 50 MG PO TABS: 50.0000 mg | ORAL_TABLET | Freq: Four times a day (QID) | ORAL | 4 refills | Status: AC

## 2021-10-18 MED ORDER — DIVALPROEX SODIUM 125 MG PO DR TAB: 250.0000 mg | DELAYED_RELEASE_TABLET | Freq: Two times a day (BID) | ORAL | Status: AC

## 2021-10-18 MED ORDER — DULOXETINE HCL 30 MG PO CPEP: 30.0000 mg | ORAL_CAPSULE | Freq: Every day | ORAL | 3 refills | Status: DC

## 2021-10-18 NOTE — Progress Notes (Signed)
MyChart Video Visit    Virtual Visit via Video Note   This visit type was conducted due to national recommendations for restrictions regarding the COVID-19 Pandemic (e.g. social distancing) in an effort to limit this patient's exposure and mitigate transmission in our community. This patient is at least at moderate risk for complications without adequate follow up. This format is felt to be most appropriate for this patient at this time. Physical exam was limited by quality of the video and audio technology used for the visit.   Patient location: home Provider location: [facility]  I discussed the limitations of evaluation and management by telemedicine and the availability of in person appointments. The patient expressed understanding and agreed to proceed.  Patient: Norman Palmer   DOB: [birth year]   80 y.o. Male  MRN: [id number] Visit Date: 10/18/2021  Today's healthcare provider: Lelon Huh, MD   Chief Complaint  Patient presents with   Hospitalization Follow-up    Subjective    HPI  Follow up Hospitalization  Patient was admitted to [facility] on 10/07/2021 and discharged on 10/09/2021. He was treated for Acute UTI. Treatment for this included: see notes in chart. Telephone follow up was done on 10/10/2021 He reports good compliance with treatment.  He reports this condition is  unchanged . Patient is sometimes more combative and calling names the last several days. Patient's wife also reports that he has swelling on the upper eyelid. She believes he has a stye.   Home health and physical therapy continue to visit. He had CBC and Met drawn this morning as well as urine collected for urine sample.  His wife would like to try changing gabapentin to Cymbalta since he seems to much more uncomfortable lately.     Medications: Outpatient Medications Prior to Visit  Medication Sig   celecoxib (CELEBREX) 100 MG capsule Take 1 capsule (100 mg total) by mouth daily. For pain    cephALEXin (KEFLEX) 250 MG capsule TAKE 1 CAPSULE BY MOUTH EVERY DAY FOR UTI PREVENTION   divalproex (DEPAKOTE) 125 MG DR tablet Take 1 tablet by mouth in the morning and at bedtime.   donepezil (ARICEPT) 10 MG tablet Take 10 mg by mouth at bedtime.    dutasteride (AVODART) 0.5 MG capsule Take 1 capsule by mouth at bedtime.   finasteride (PROSCAR) 5 MG tablet Take 1 tablet (5 mg total) by mouth daily.   gabapentin (NEURONTIN) 300 MG capsule Take 1 capsule (300 mg total) by mouth 2 (two) times daily.   insulin NPH Human (NOVOLIN N) 100 UNIT/ML injection Inject 22 Units into the skin daily.   lisinopril (ZESTRIL) 20 MG tablet TAKE 1 TABLET BY MOUTH DAILY   memantine (NAMENDA) 10 MG tablet Take 1 tablet (10 mg total) by mouth 2 (two) times daily.   metFORMIN (GLUCOPHAGE) 1000 MG tablet TAKE ONE TABLET TWICE DAILY WITH MEALS (Patient taking differently: Take 1,000 mg by mouth 2 (two) times daily with a meal.)   risperiDONE (RISPERDAL) 0.5 MG tablet TAKE 1 TABLET BY MOUTH AT BEDTIME   senna-docusate (SENOKOT-S) 8.6-50 MG tablet Take 1 tablet by mouth at bedtime as needed for mild constipation.   sertraline (ZOLOFT) 50 MG tablet Take 50 mg by mouth daily.   silodosin (RAPAFLO) 8 MG CAPS capsule Take 1 capsule (8 mg total) by mouth daily with breakfast.   silver sulfADIAZINE (SILVADENE) 1 % cream Apply 1 application topically daily.   traMADol (ULTRAM) 50 MG tablet TAKE ONE TABLET EVERY 8 HOURS  AS NEEDED   triamterene-hydrochlorothiazide (DYAZIDE) 37.5-25 MG capsule TAKE 1 CAPSULE BY MOUTH EACH MORNING   Vitamin D, Ergocalciferol, (DRISDOL) 1.25 MG (50000 UNIT) CAPS capsule TAKE 1 CAPSULE BY MOUTH ONCE A WEEK (Patient taking differently: wednesday)   zinc oxide 20 % ointment Apply 1 application topically as needed for irritation. Apply to affected area daily   No facility-administered medications prior to visit.    Review of Systems  Constitutional:  Negative for appetite change, chills and fever.   Eyes:        Eyelid swelling  Respiratory:  Negative for chest tightness, shortness of breath and wheezing.   Cardiovascular:  Negative for chest pain and palpitations.  Gastrointestinal:  Negative for abdominal pain, nausea and vomiting.  Psychiatric/Behavioral:  Positive for agitation.      Objective    There were no vitals taken for this visit.   Physical Exam   Awake, alert, oriented x 3. In no apparent distress    Assessment & Plan     1. Neuropathy, peripheral, autonomic, idiopathic Try change form gabapentin 300 BID to  DULoxetine (CYMBALTA) 30 MG capsule; Take 1 capsule (30 mg total) by mouth daily.  Dispense: 30 capsule; Refill: 3  Increase traMADol (ULTRAM) 50 MG tablet from Q8 to 4 (four) times daily.  Dispense: 120 tablet; Refill: 4  2. Dementia with behavioral disturbance Double divalproex (DEPAKOTE) 125 MG DR tablet to take 2 tablets (250 mg total) by mouth in the morning and at bedtime.  3. Hordeolum externum, unspecified laterality  - ciprofloxacin (CILOXAN) 0.3 % ophthalmic solution; Place 1 drop into the right eye every 4 (four) hours while awake for 7 days.  Dispense: 5 mL; Refill: 0   Awaiting results of CBC, met C and u/a/culture.      I discussed the assessment and treatment plan with the patient. The patient was provided an opportunity to ask questions and all were answered. The patient agreed with the plan and demonstrated an understanding of the instructions.   The patient was advised to call back or seek an in-person evaluation if the symptoms worsen or if the condition fails to improve as anticipated.  I provided 14 minutes of non-face-to-face time during this encounter.  The entirety of the information documented in the History of Present Illness, Review of Systems and Physical Exam were personally obtained by me. Portions of this information were initially documented by the CMA and reviewed by me for thoroughness and accuracy.    Lelon Huh, MD [facility] [phone] (phone) [phone] (fax)  [place]

## 2021-10-20 ENCOUNTER — Encounter: Payer: Self-pay | Admitting: Family Medicine

## 2021-10-21 ENCOUNTER — Encounter (INDEPENDENT_AMBULATORY_CARE_PROVIDER_SITE_OTHER): Payer: Medicare Other | Admitting: Family Medicine

## 2021-10-21 ENCOUNTER — Other Ambulatory Visit: Payer: Self-pay | Admitting: Family Medicine

## 2021-10-21 DIAGNOSIS — N390 Urinary tract infection, site not specified: Secondary | ICD-10-CM | POA: Diagnosis not present

## 2021-10-22 ENCOUNTER — Ambulatory Visit: Payer: Medicare Other | Admitting: Physician Assistant

## 2021-10-22 NOTE — Telephone Encounter (Signed)
Copied from [place] [phone]. Topic: General - Other >> Oct 22, 2021 10:21 AM Leward Quan A wrote: Reason for CRM: Marijean Heath with [facility]  called in to inform Dr Caryn Section that [employer] did not run patients urine on Friday and this is not the first time they have not done his testing. Would like to know if Dr Caryn Section could order that a urine sample is dropped off at the office to be checked. Please call [place] waiting for an answer Ph# [phone]

## 2021-10-23 NOTE — Telephone Encounter (Signed)
Home health nurse [place] advise. She plans to have patient's wife bring a urine sample by our office.

## 2021-10-23 NOTE — Telephone Encounter (Signed)
That's fine

## 2021-10-24 ENCOUNTER — Other Ambulatory Visit: Payer: Self-pay | Admitting: Family Medicine

## 2021-10-24 DIAGNOSIS — N390 Urinary tract infection, site not specified: Secondary | ICD-10-CM | POA: Diagnosis not present

## 2021-10-24 LAB — POCT URINALYSIS DIPSTICK
Bilirubin, UA: NEGATIVE
Glucose, UA: NEGATIVE
Ketones, UA: NEGATIVE
Nitrite, UA: POSITIVE
Protein, UA: POSITIVE — AB
Spec Grav, UA: 1.01 (ref 1.010–1.025)
Urobilinogen, UA: 0.2 E.U./dL
pH, UA: 7.5 (ref 5.0–8.0)

## 2021-10-24 NOTE — Addendum Note (Signed)
Addended by: Ashley Royalty E on: 10/24/2021 10:35 AM   Modules accepted: Orders

## 2021-10-24 NOTE — Telephone Encounter (Signed)
Mrs. Tupy dropped off a urine sample.  See results.  I sent it off for culture.   Thanks,   -Mickel Baas

## 2021-10-25 MED ORDER — SULFAMETHOXAZOLE-TRIMETHOPRIM 800-160 MG PO TABS: 1.0000 | ORAL_TABLET | Freq: Two times a day (BID) | ORAL | 0 refills | Status: DC

## 2021-10-25 NOTE — Addendum Note (Signed)
Addended by: Birdie Sons on: 10/25/2021 11:33 AM   Modules accepted: Orders

## 2021-11-02 LAB — URINE CULTURE

## 2021-11-02 LAB — SPECIMEN STATUS REPORT

## 2021-11-04 ENCOUNTER — Other Ambulatory Visit: Payer: Self-pay | Admitting: Family Medicine

## 2021-11-06 ENCOUNTER — Other Ambulatory Visit: Payer: Self-pay | Admitting: Radiology

## 2021-11-07 ENCOUNTER — Other Ambulatory Visit: Payer: Self-pay

## 2021-11-07 ENCOUNTER — Encounter: Payer: Self-pay | Admitting: Radiology

## 2021-11-07 ENCOUNTER — Ambulatory Visit: Payer: Medicare Other | Admitting: Radiology

## 2021-11-07 ENCOUNTER — Ambulatory Visit
Admission: RE | Admit: 2021-11-07 | Discharge: 2021-11-07 | Disposition: A | Discharge status: Home / Self Care | Payer: Medicare Other | Source: Ambulatory Visit | Attending: Physician Assistant | Admitting: Physician Assistant

## 2021-11-07 DIAGNOSIS — N401 Enlarged prostate with lower urinary tract symptoms: Secondary | ICD-10-CM | POA: Insufficient documentation

## 2021-11-07 DIAGNOSIS — Z435 Encounter for attention to cystostomy: Secondary | ICD-10-CM | POA: Insufficient documentation

## 2021-11-07 DIAGNOSIS — Z7984 Long term (current) use of oral hypoglycemic drugs: Secondary | ICD-10-CM | POA: Insufficient documentation

## 2021-11-07 DIAGNOSIS — R338 Other retention of urine: Secondary | ICD-10-CM | POA: Diagnosis not present

## 2021-11-07 DIAGNOSIS — R339 Retention of urine, unspecified: Secondary | ICD-10-CM

## 2021-11-07 DIAGNOSIS — Z87440 Personal history of urinary (tract) infections: Secondary | ICD-10-CM | POA: Diagnosis not present

## 2021-11-07 HISTORY — PX: IR CATHETER TUBE CHANGE: IMG717

## 2021-11-07 LAB — GLUCOSE, CAPILLARY: Glucose-Capillary: 104 mg/dL — ABNORMAL HIGH (ref 70–99)

## 2021-11-07 IMAGING — XA IR CATHETER TUBE CHANGE
1 series · 1 of 1 positions shown · non-contrast
Comparison: [DATE]

INDICATION: 79-year-old with partial bladder outlet obstruction and history of
recurrent urinary tract infections. Suprapubic tube was placed on
[DATE]. Patient presents for tube exchange and up sizing.

EXAM:
EXCHANGE OF SUPRAPUBIC TUBE WITH FLUOROSCOPY

[Series 3: interv standard · 1 of 1 slices shown]
[im 1/1]
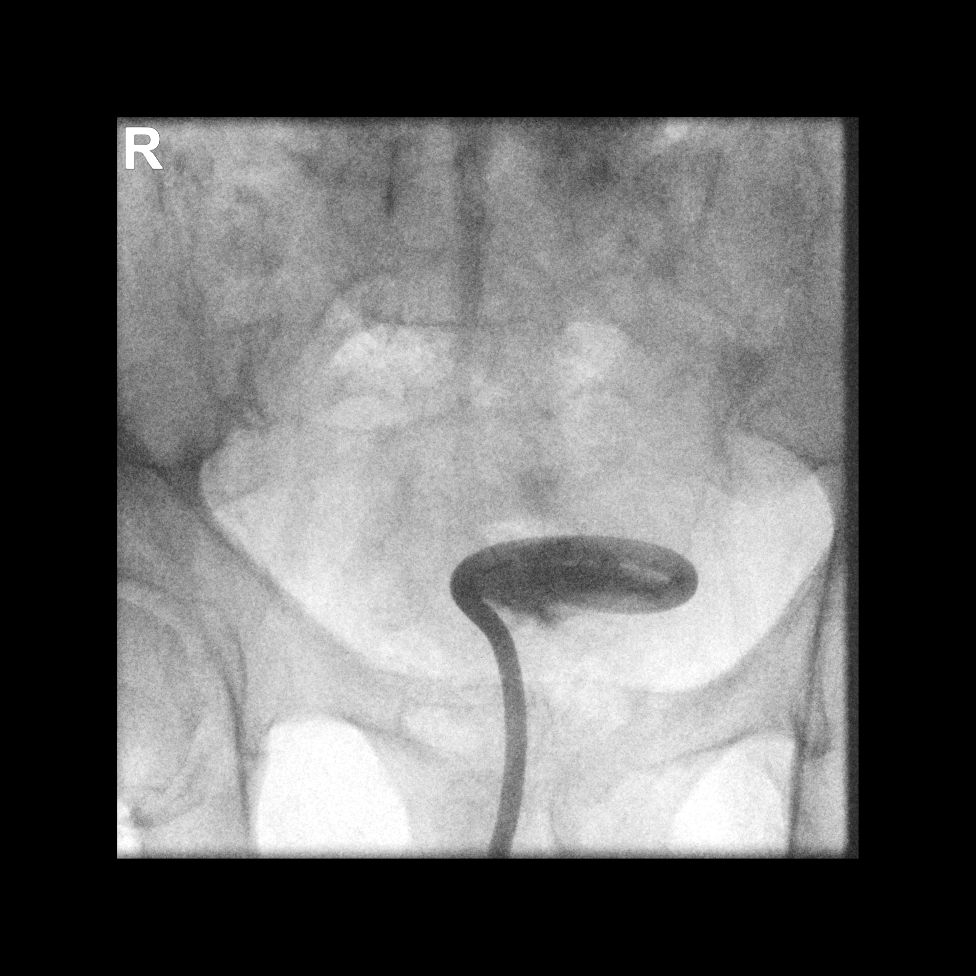

[1 of 1 positions shown; findings below may reference images not displayed]

MEDICATIONS:
Moderate sedation

ANESTHESIA/SEDATION:
Moderate (conscious) sedation was employed during this procedure. A
total of Versed 1.0 mg and Fentanyl 25 mcg was administered
intravenously by the radiology nurse.

Total intra-service moderate Sedation Time: 9 minutes. The patient's
level of consciousness and vital signs were monitored continuously
by radiology nursing throughout the procedure under my direct
supervision.

CONTRAST:  5 mL Omnipaque 350-administered into the collecting
system(s)

FLUOROSCOPY TIME:  Fluoroscopy Time: 54 seconds, 14.6 mGy

COMPLICATIONS:
None immediate.

PROCEDURE:
Informed written consent was obtained from the patient and wife
after a thorough discussion of the procedural risks, benefits and
alternatives. All questions were addressed. Maximal Sterile Barrier
Technique was utilized including caps, mask, sterile gowns, sterile
gloves, sterile drape, hand hygiene and skin antiseptic. A timeout
was performed prior to the initiation of the procedure.

Patient was placed supine on the interventional table. The existing
catheter and surrounding skin were prepped and draped in sterile
fashion. Contrast injection confirmed placement in the bladder. The
catheter was cut and removed over a superstiff Amplatz wire. The
tract was dilated to accommodate a new 16 French pigtail catheter.
The catheter was coiled in the bladder. Contrast injection confirmed
placement in the bladder. Skin was anesthetized with 1% lidocaine
and the catheter was sutured to skin and attached to a gravity bag.
FINDINGS: New 16 French pigtail catheter in the bladder.
IMPRESSION: Successful exchange and up sizing of the suprapubic tube. The
patient now has a 16 French pigtail catheter. Plan for tube exchange
in 4-6 weeks and conversion to a balloon retention catheter.

## 2021-11-07 MED ORDER — FENTANYL CITRATE (PF) 100 MCG/2ML IJ SOLN
INTRAMUSCULAR | Status: AC
  Filled 2021-11-07: qty 2

## 2021-11-07 MED ORDER — IOHEXOL 350 MG/ML SOLN
5.0000 mL | Freq: Once | INTRAVENOUS | Status: AC | PRN
  Administered 2021-11-07: 12:00:00 5 mL
  Filled 2021-11-07: qty 5

## 2021-11-07 MED ORDER — FENTANYL CITRATE (PF) 100 MCG/2ML IJ SOLN
INTRAMUSCULAR | Status: DC | PRN
  Administered 2021-11-07: 25 ug via INTRAVENOUS

## 2021-11-07 MED ORDER — MIDAZOLAM HCL 2 MG/2ML IJ SOLN
INTRAMUSCULAR | Status: AC
  Filled 2021-11-07: qty 2

## 2021-11-07 MED ORDER — SODIUM CHLORIDE 0.9 % IV SOLN
INTRAVENOUS | Status: DC
  Filled 2021-11-07: qty 1000

## 2021-11-07 MED ORDER — LIDOCAINE HCL 1 % IJ SOLN
INTRAMUSCULAR | Status: AC
  Administered 2021-11-07: 12:00:00 6 mL
  Filled 2021-11-07: qty 20

## 2021-11-07 MED ORDER — MIDAZOLAM HCL 2 MG/2ML IJ SOLN
INTRAMUSCULAR | Status: DC | PRN
  Administered 2021-11-07: 1 mg via INTRAVENOUS

## 2021-11-07 NOTE — Procedures (Signed)
Interventional Radiology Procedure:   Indications: Needs replacement and upsize of suprapubic tube  Procedure: Suprapubic tube exchange  Findings: New 16 Fr pigtail drain placed in bladder  Complications: No immediate complications noted.     EBL: Minimal  Plan: Plan for tube exchange in 4-6 weeks and converting to balloon retention tube.    Andree Heeg R. Anselm Pancoast, MD  Pager: [phone]

## 2021-11-07 NOTE — H&P (Signed)
Chief Complaint: Patient was seen in consultation today for suprapubic catheter at the request of [place]  Referring Physician(s): Vaillancourt,Samantha  Supervising Physician: Markus Daft  Patient Status: [facility] - Out-pt  History of Present Illness: Norman Palmer is a 80 y.o. male with PMHx significant for hypospadias, urethral strictures and BPH with history of recurrent UTIs, incontinence and urinary retention. The patient underwent successful 80F suprapubic catheter placement with our service on 10/18 and is scheduled today for suprapubic catheter exchange and upsize. The patient and his family deny any complaints with the existing SPT. He denies any chest pain or shortness of breath.  Past Medical History:  Diagnosis Date   Dementia ([place])    Diabetes mellitus without complication ([place])    H/O knee surgery 04/17/2015   Overview:  Bilateral knees surgery    History of basal cell carcinoma (BCC) of skin 08/12/2019   MOHS Right alar crease [facility] Dr. Manley Mason 07/2019   Hypospadias    and strictures   Infection with methicillin-resistant Staphylococcus aureus 08/22/2015   Insomnia    Lumbago    Sciatica    Suprapubic catheter ([place]) 10/08/2021   Thyroid disease     Past Surgical History:  Procedure Laterality Date   IR GUIDED DRAIN W CATHETER PLACEMENT  10/08/2021   knee surgery Bilateral 2000   MOHS SURGERY Right 08/11/2019   BCC alar crease- [facility] Dr. Manley Mason   SKIN GRAFT Bilateral 1955   legs   URETHRAL STRICTURE DILATATION      Allergies: Patient has no active allergies.  Medications: Prior to Admission medications   Medication Sig Start Date End Date Taking? Authorizing Provider  celecoxib (CELEBREX) 100 MG capsule TAKE 1 CAPSULE BY MOUTH EVERY DAY FOR PAIN 11/04/21  Yes Birdie Sons, MD  cephALEXin (KEFLEX) 250 MG capsule TAKE 1 CAPSULE BY MOUTH EVERY DAY FOR UTI PREVENTION 09/25/21  Yes Birdie Sons, MD  divalproex (DEPAKOTE) 125 MG DR  tablet Take 2 tablets (250 mg total) by mouth in the morning and at bedtime. 10/18/21  Yes Birdie Sons, MD  donepezil (ARICEPT) 10 MG tablet Take 10 mg by mouth at bedtime.    Yes [provider]  DULoxetine (CYMBALTA) 30 MG capsule Take 1 capsule (30 mg total) by mouth daily. 10/18/21  Yes Birdie Sons, MD  dutasteride (AVODART) 0.5 MG capsule Take 1 capsule by mouth at bedtime. 02/07/21  Yes [provider]  finasteride (PROSCAR) 5 MG tablet Take 1 tablet (5 mg total) by mouth daily. 06/11/21  Yes Vaillancourt, Samantha, PA-C  insulin NPH Human (NOVOLIN N) 100 UNIT/ML injection Inject 22 Units into the skin daily. 11/09/20 11/09/21 Yes [provider]  lisinopril (ZESTRIL) 20 MG tablet TAKE 1 TABLET BY MOUTH DAILY 09/01/21  Yes Birdie Sons, MD  memantine (NAMENDA) 10 MG tablet Take 1 tablet (10 mg total) by mouth 2 (two) times daily. 09/23/21  Yes Birdie Sons, MD  metFORMIN (GLUCOPHAGE) 1000 MG tablet Take 1 tablet (1,000 mg total) by mouth 2 (two) times daily with a meal. 10/21/21  Yes Birdie Sons, MD  risperiDONE (RISPERDAL) 0.5 MG tablet TAKE 1 TABLET BY MOUTH AT BEDTIME 08/29/21  Yes Birdie Sons, MD  senna-docusate (SENOKOT-S) 8.6-50 MG tablet Take 1 tablet by mouth at bedtime as needed for mild constipation. 10/09/21  Yes Lorella Nimrod, MD  sertraline (ZOLOFT) 50 MG tablet Take 50 mg by mouth daily. 11/03/19  Yes [provider]  silodosin (RAPAFLO) 8 MG  CAPS capsule Take 1 capsule (8 mg total) by mouth daily with breakfast. 06/11/21  Yes Vaillancourt, Samantha, PA-C  silver sulfADIAZINE (SILVADENE) 1 % cream Apply 1 application topically daily. 09/11/21  Yes Birdie Sons, MD  traMADol (ULTRAM) 50 MG tablet Take 1 tablet (50 mg total) by mouth 4 (four) times daily. 10/18/21  Yes Birdie Sons, MD  triamterene-hydrochlorothiazide (DYAZIDE) 37.5-25 MG capsule TAKE 1 CAPSULE BY MOUTH EACH MORNING 06/18/21  Yes Birdie Sons, MD   Vitamin D, Ergocalciferol, (DRISDOL) 1.25 MG (50000 UNIT) CAPS capsule TAKE 1 CAPSULE BY MOUTH ONCE A WEEK Patient taking differently: wednesday 08/29/21  Yes Birdie Sons, MD  zinc oxide 20 % ointment Apply 1 application topically as needed for irritation. Apply to affected area daily 05/14/21  Yes Fisher, Kirstie Peri, MD     Family History  Problem Relation Age of Onset   Hypertension Father    Cancer Brother        Liver Cancer    Social History   Socioeconomic History   Marital status: Married    Spouse name: Not on file   Number of children: 3   Years of education: MBA   Highest education level: Master's degree (e.g., MA, MS, MEng, MEd, MSW, MBA)  Occupational History   Occupation: retired  Tobacco Use   Smoking status: Former    Packs/day: 2.00    Years: 30.00    Pack years: 60.00    Types: Cigarettes   Smokeless tobacco: Former   Tobacco comments:    quit over 20 years ago  Media planner   Vaping Use: Never used  Substance and Sexual Activity   Alcohol use: Not Currently   Drug use: No   Sexual activity: Not on file  Other Topics Concern   Not on file  Social History Narrative   Not on file   Social Determinants of Health   Financial Resource Strain: Not on file  Food Insecurity: Not on file  Transportation Needs: Not on file  Physical Activity: Not on file  Stress: Not on file  Social Connections: Not on file   Review of Systems: A 12 point ROS discussed and pertinent positives are indicated in the HPI above.  All other systems are negative.  Review of Systems  Vital Signs: BP (!) 158/79   Pulse 88   Temp 98.2 F (36.8 C) (Oral)   Resp 18   Ht 6' (1.829 m)   Wt 250 lb (113.4 kg)   SpO2 94%   BMI 33.91 kg/m   Physical Exam Constitutional:      General: He is not in acute distress. HENT:     Head: Normocephalic and atraumatic.  Cardiovascular:     Rate and Rhythm: Normal rate.     Heart sounds: Murmur heard.  Pulmonary:     Effort:  Pulmonary effort is normal. No respiratory distress.  Abdominal:     General: Abdomen is flat.     Palpations: Abdomen is soft.  Genitourinary:    Comments: SPT intact Neurological:     Mental Status: He is alert.    Imaging: No results found.  Labs:  CBC: Recent Labs    03/26/21 0541 05/07/21 0959 10/07/21 1456 10/08/21 0012  WBC 15.1* 16.1* 11.3* 10.4  HGB 9.6* 12.3* 11.4* 10.1*  HCT 29.7* 38.5 35.0* 30.7*  PLT 330 437 339 282    COAGS: Recent Labs    10/07/21 1457 10/08/21 0012  INR 1.0 1.1  APTT  30 30    BMP: Recent Labs    03/26/21 0541 05/07/21 0959 10/07/21 1456 10/07/21 2217 10/08/21 0012  NA 130* 136 134* 133* 132*  K 4.4 5.1 5.5* 4.9 4.8  CL 103 97 99 102 104  CO2 22 19* 22 24 20*  GLUCOSE 174* 144* 95 117* 132*  BUN 48* 30* 68* 66* 57*  CALCIUM 8.3* 9.6 9.2 8.5* 8.4*  CREATININE 1.28* 1.36* 1.93* 1.56* 1.33*  GFRNONAA 58*  --  35* 45* 55*    LIVER FUNCTION TESTS: Recent Labs    03/25/21 0458 03/26/21 0541 05/07/21 0959 10/07/21 1456 10/07/21 2217 10/08/21 0012  BILITOT 0.6 0.6 <0.2 0.3  --   --   AST 104* 70* 16 23  --   --   ALT 103* 100* 18 14  --   --   ALKPHOS 63 55 96 53  --   --   PROT 6.0* 5.7* 7.0 6.7  --   --   ALBUMIN 2.5* 2.3* 4.2 3.1* 3.1* 2.8*    Assessment and Plan: 80 year old male with PMHx significant for hypospadias, urethral strictures and BPH with history of recurrent UTIs, incontinence and urinary retention. The patient underwent successful 35F suprapubic catheter placement with our service on 10/18 and is scheduled today for suprapubic catheter exchange and upsize. The patient and family state no concerns or complications with existing catheter.   The patient has been NPO, no blood thinners taken, labs and vitals have been reviewed.  Risks and benefits of image guided suprapubic catheter exchange and upsize was discussed with the patient and/or patient's family including, but not limited to bleeding,  infection, and damage to adjacent structures.  All of the questions were answered and there is agreement to proceed.  Consent signed and in chart.   Thank you for this interesting consult.  I greatly enjoyed meeting KAJ VASIL and look forward to participating in their care.  A copy of this report was sent to the requesting provider on this date.  Electronically Signed: Hedy Jacob, PA-C 11/07/2021, 11:40 AM   I spent a total of 15 Minutes in face to face in clinical consultation, greater than 50% of which was counseling/coordinating care for hypospadias, urethral strictures and BPH with history of recurrent UTIs, incontinence and urinary retention.

## 2021-11-07 NOTE — Progress Notes (Signed)
Patient clinically stable post SPT placement/upsize per DR Anselm Pancoast to 16 Fr SPt. Tolerated well. Karen/wife at bedside post procedure. Received Versed 1 mg along with Fentanyl 25 mcg IV for procedure. Denies complaints post procedure. Update given to wife at bedside. Report given to Gari Crown Rn in specials post procedure.

## 2021-11-12 ENCOUNTER — Telehealth: Payer: Self-pay

## 2021-11-12 NOTE — Telephone Encounter (Signed)
Copied from [place] [id number]. Topic: Quick Communication - Home Health Verbal Orders >> Nov 11, 2021  4:43 PM Tessa Lerner A wrote: Caller/Agency: Erline Levine / Latricia Heft  Callback Number: [phone] Requesting OT/PT/Skilled Nursing/Social Work/Speech Therapy: PT Frequency: 1w4 !q2w4

## 2021-11-12 NOTE — Telephone Encounter (Signed)
That's fine

## 2021-11-12 NOTE — Telephone Encounter (Signed)
Stacey advised. 

## 2021-11-13 DIAGNOSIS — R262 Difficulty in walking, not elsewhere classified: Secondary | ICD-10-CM | POA: Diagnosis not present

## 2021-11-13 DIAGNOSIS — F03911 Unspecified dementia, unspecified severity, with agitation: Secondary | ICD-10-CM | POA: Diagnosis not present

## 2021-11-13 DIAGNOSIS — Z794 Long term (current) use of insulin: Secondary | ICD-10-CM | POA: Diagnosis not present

## 2021-11-13 DIAGNOSIS — R339 Retention of urine, unspecified: Secondary | ICD-10-CM | POA: Diagnosis not present

## 2021-11-13 DIAGNOSIS — Z7984 Long term (current) use of oral hypoglycemic drugs: Secondary | ICD-10-CM | POA: Diagnosis not present

## 2021-11-13 DIAGNOSIS — Z466 Encounter for fitting and adjustment of urinary device: Secondary | ICD-10-CM | POA: Diagnosis not present

## 2021-11-13 DIAGNOSIS — I129 Hypertensive chronic kidney disease with stage 1 through stage 4 chronic kidney disease, or unspecified chronic kidney disease: Secondary | ICD-10-CM | POA: Diagnosis not present

## 2021-11-13 DIAGNOSIS — M2450 Contracture, unspecified joint: Secondary | ICD-10-CM | POA: Diagnosis not present

## 2021-11-13 DIAGNOSIS — Z435 Encounter for attention to cystostomy: Secondary | ICD-10-CM | POA: Diagnosis not present

## 2021-11-13 DIAGNOSIS — Z792 Long term (current) use of antibiotics: Secondary | ICD-10-CM | POA: Diagnosis not present

## 2021-11-13 DIAGNOSIS — L89323 Pressure ulcer of left buttock, stage 3: Secondary | ICD-10-CM | POA: Diagnosis not present

## 2021-11-13 DIAGNOSIS — F329 Major depressive disorder, single episode, unspecified: Secondary | ICD-10-CM | POA: Diagnosis not present

## 2021-11-13 DIAGNOSIS — N401 Enlarged prostate with lower urinary tract symptoms: Secondary | ICD-10-CM | POA: Diagnosis not present

## 2021-11-13 DIAGNOSIS — N390 Urinary tract infection, site not specified: Secondary | ICD-10-CM | POA: Diagnosis not present

## 2021-11-13 DIAGNOSIS — E1140 Type 2 diabetes mellitus with diabetic neuropathy, unspecified: Secondary | ICD-10-CM | POA: Diagnosis not present

## 2021-11-13 DIAGNOSIS — N183 Chronic kidney disease, stage 3 unspecified: Secondary | ICD-10-CM | POA: Diagnosis not present

## 2021-11-19 ENCOUNTER — Telehealth: Payer: Self-pay

## 2021-11-19 DIAGNOSIS — R339 Retention of urine, unspecified: Secondary | ICD-10-CM

## 2021-11-19 NOTE — Telephone Encounter (Signed)
-----   Message from Debroah Loop, [place] sent at 11/08/2021 10:06 AM EST ----- Acquanetta Belling, but I know you're already aware. ----- Message ----- From: Interface, Rad Results In Sent: 11/08/2021   9:22 AM EST To: Debroah Loop, PA-C

## 2021-11-19 NOTE — Telephone Encounter (Signed)
Order placed for next upsizing/exhange in IR per note from Letona RN @[facility]  IR. Patient will require sedated at exchange due to dementia. Message sent to scheduling awaiting response.

## 2021-11-20 ENCOUNTER — Encounter: Payer: Self-pay | Admitting: Family Medicine

## 2021-11-22 NOTE — Telephone Encounter (Signed)
Patient was scheduled for IR exchange on 12/15 @11am  arrive @10am . Called to notify patient's wife, left a message for her to return call and confirm appointment change

## 2021-11-23 ENCOUNTER — Encounter: Payer: Self-pay | Admitting: Family Medicine

## 2021-11-24 ENCOUNTER — Encounter: Payer: Self-pay | Admitting: Family Medicine

## 2021-11-24 DIAGNOSIS — N390 Urinary tract infection, site not specified: Secondary | ICD-10-CM

## 2021-11-25 LAB — POCT URINALYSIS DIPSTICK
Bilirubin, UA: NEGATIVE
Glucose, UA: NEGATIVE
Ketones, UA: NEGATIVE
Nitrite, UA: POSITIVE
Protein, UA: POSITIVE — AB
Spec Grav, UA: 1.015 (ref 1.010–1.025)
Urobilinogen, UA: 0.2 E.U./dL
pH, UA: 6 (ref 5.0–8.0)

## 2021-11-26 MED ORDER — SULFAMETHOXAZOLE-TRIMETHOPRIM 800-160 MG PO TABS: 1.0000 | ORAL_TABLET | Freq: Two times a day (BID) | ORAL | 0 refills | Status: AC

## 2021-11-26 NOTE — Telephone Encounter (Signed)
Called and spoke with patient's wife Santiago Glad, she was given update to exchange and verbalized agreement. She states that patient currently has a home health nurse and can only be transported by gurney with ambulance. Home Health nurse through [facility] [phone]), Lolita, RN [phone]). Patient's wife would like to know if possible for Sonya to take over monthly exchanges at home. Per Sam this is fine after 12-15 exchange in IR. Alberteen Sam, RN no answer left message to return call. Edmund and spoke with Ander Purpura, RN who was able to take verbal order over the phone and relay message to [place]. Verbal order given: 4wk monthly SPT exchange 16 FR with night bag, PRN dressing exchange, PRN Irrigation. Lauren gave verbal read back on orders given.   Per Sam patient is to follow up yearly. Patient's wife notified and appointment made

## 2021-11-29 ENCOUNTER — Other Ambulatory Visit: Payer: Self-pay | Admitting: Family Medicine

## 2021-11-29 NOTE — Progress Notes (Signed)
Patient on schedule for SPT upsize, called and spoke with wife on phone with pre procedure instructions given. Since patient has agitation at times secondarily to dementia, decision between myself/clinic and wife, to give possible light sedation , made aware to have patient NPO after 0200, to arrive per non emergent EMS for transportation per wife. Stated understanding.

## 2021-11-30 LAB — URINE CULTURE

## 2021-11-30 LAB — SPECIMEN STATUS REPORT

## 2021-12-03 ENCOUNTER — Other Ambulatory Visit: Payer: Self-pay | Admitting: Family Medicine

## 2021-12-03 DIAGNOSIS — N390 Urinary tract infection, site not specified: Secondary | ICD-10-CM

## 2021-12-03 MED ORDER — LEVOFLOXACIN 750 MG PO TABS: 750.0000 mg | ORAL_TABLET | Freq: Every day | ORAL | 0 refills | Status: DC

## 2021-12-04 ENCOUNTER — Other Ambulatory Visit: Payer: Self-pay | Admitting: Radiology

## 2021-12-05 ENCOUNTER — Encounter: Payer: Self-pay | Admitting: Radiology

## 2021-12-05 ENCOUNTER — Other Ambulatory Visit: Payer: Self-pay

## 2021-12-05 ENCOUNTER — Ambulatory Visit: Payer: Medicare Other | Admitting: Physician Assistant

## 2021-12-05 ENCOUNTER — Ambulatory Visit
Admission: RE | Admit: 2021-12-05 | Discharge: 2021-12-05 | Disposition: A | Discharge status: Home / Self Care | Payer: Medicare Other | Source: Ambulatory Visit | Attending: Physician Assistant | Admitting: Physician Assistant

## 2021-12-05 DIAGNOSIS — R339 Retention of urine, unspecified: Secondary | ICD-10-CM | POA: Diagnosis not present

## 2021-12-05 DIAGNOSIS — Z7401 Bed confinement status: Secondary | ICD-10-CM | POA: Diagnosis not present

## 2021-12-05 DIAGNOSIS — R404 Transient alteration of awareness: Secondary | ICD-10-CM | POA: Diagnosis not present

## 2021-12-05 DIAGNOSIS — W19XXXA Unspecified fall, initial encounter: Secondary | ICD-10-CM | POA: Diagnosis not present

## 2021-12-05 DIAGNOSIS — R0902 Hypoxemia: Secondary | ICD-10-CM | POA: Diagnosis not present

## 2021-12-05 DIAGNOSIS — I10 Essential (primary) hypertension: Secondary | ICD-10-CM | POA: Diagnosis not present

## 2021-12-05 HISTORY — PX: IR CATHETER TUBE CHANGE: IMG717

## 2021-12-05 LAB — GLUCOSE, CAPILLARY: Glucose-Capillary: 117 mg/dL — ABNORMAL HIGH (ref 70–99)

## 2021-12-05 MED ORDER — FENTANYL CITRATE (PF) 100 MCG/2ML IJ SOLN
INTRAMUSCULAR | Status: AC
  Filled 2021-12-05: qty 2

## 2021-12-05 MED ORDER — LIDOCAINE HCL 1 % IJ SOLN
INTRAMUSCULAR | Status: AC
  Administered 2021-12-05: 2 mL
  Filled 2021-12-05: qty 20

## 2021-12-05 MED ORDER — LIDOCAINE VISCOUS HCL 2 % MT SOLN
OROMUCOSAL | Status: AC
  Filled 2021-12-05: qty 15

## 2021-12-05 MED ORDER — MIDAZOLAM HCL 2 MG/2ML IJ SOLN
INTRAMUSCULAR | Status: AC
  Filled 2021-12-05: qty 2

## 2021-12-05 MED ORDER — SODIUM CHLORIDE 0.9 % IV SOLN
INTRAVENOUS | Status: DC
  Filled 2021-12-05: qty 1000

## 2021-12-05 MED ORDER — FENTANYL CITRATE (PF) 100 MCG/2ML IJ SOLN
INTRAMUSCULAR | Status: AC | PRN
  Administered 2021-12-05: 50 ug via INTRAVENOUS

## 2021-12-05 MED ORDER — IOHEXOL 350 MG/ML SOLN
8.0000 mL | Freq: Once | INTRAVENOUS | Status: AC | PRN
  Administered 2021-12-05: 8 mL
  Filled 2021-12-05: qty 10

## 2021-12-05 MED ORDER — MIDAZOLAM HCL 2 MG/2ML IJ SOLN
INTRAMUSCULAR | Status: AC | PRN
  Administered 2021-12-05: 1 mg via INTRAVENOUS

## 2021-12-05 NOTE — Procedures (Signed)
Pre procedural Dx: Urinary retention. Post procedural Dx: Same  Technically successful fluoroscopic guided exchange and conversion to 16 French balloon retention Council suprapubic catheter for the purposes of chronic urinary bladder decompression.    EBL: Trace Complications: None immediate  PLAN:  The patient may now undergo routine bedside suprapubic catheter exchanges as indicated by the providing urologic service.    Ronny Bacon, MD Pager #: [phone]

## 2021-12-05 NOTE — Progress Notes (Signed)
Patient clinically stable post SPT upsize to 16Fr balloon council per Dr Pascal Lux. Tolerated well. Denies complaints at this time. Wife at bedside post procedure. Discharge instructions given with questions answered. Report given to Genelle Bal RN post procedure.

## 2021-12-13 DIAGNOSIS — Z7984 Long term (current) use of oral hypoglycemic drugs: Secondary | ICD-10-CM | POA: Diagnosis not present

## 2021-12-13 DIAGNOSIS — F03911 Unspecified dementia, unspecified severity, with agitation: Secondary | ICD-10-CM | POA: Diagnosis not present

## 2021-12-13 DIAGNOSIS — Z794 Long term (current) use of insulin: Secondary | ICD-10-CM | POA: Diagnosis not present

## 2021-12-13 DIAGNOSIS — N390 Urinary tract infection, site not specified: Secondary | ICD-10-CM | POA: Diagnosis not present

## 2021-12-13 DIAGNOSIS — I129 Hypertensive chronic kidney disease with stage 1 through stage 4 chronic kidney disease, or unspecified chronic kidney disease: Secondary | ICD-10-CM | POA: Diagnosis not present

## 2021-12-13 DIAGNOSIS — E1140 Type 2 diabetes mellitus with diabetic neuropathy, unspecified: Secondary | ICD-10-CM | POA: Diagnosis not present

## 2021-12-13 DIAGNOSIS — Z435 Encounter for attention to cystostomy: Secondary | ICD-10-CM | POA: Diagnosis not present

## 2021-12-13 DIAGNOSIS — Z466 Encounter for fitting and adjustment of urinary device: Secondary | ICD-10-CM | POA: Diagnosis not present

## 2021-12-13 DIAGNOSIS — R262 Difficulty in walking, not elsewhere classified: Secondary | ICD-10-CM | POA: Diagnosis not present

## 2021-12-13 DIAGNOSIS — N401 Enlarged prostate with lower urinary tract symptoms: Secondary | ICD-10-CM | POA: Diagnosis not present

## 2021-12-13 DIAGNOSIS — Z792 Long term (current) use of antibiotics: Secondary | ICD-10-CM | POA: Diagnosis not present

## 2021-12-13 DIAGNOSIS — M2450 Contracture, unspecified joint: Secondary | ICD-10-CM | POA: Diagnosis not present

## 2021-12-13 DIAGNOSIS — R339 Retention of urine, unspecified: Secondary | ICD-10-CM | POA: Diagnosis not present

## 2021-12-13 DIAGNOSIS — L89323 Pressure ulcer of left buttock, stage 3: Secondary | ICD-10-CM | POA: Diagnosis not present

## 2021-12-13 DIAGNOSIS — F329 Major depressive disorder, single episode, unspecified: Secondary | ICD-10-CM | POA: Diagnosis not present

## 2021-12-13 DIAGNOSIS — N183 Chronic kidney disease, stage 3 unspecified: Secondary | ICD-10-CM | POA: Diagnosis not present

## 2021-12-17 ENCOUNTER — Telehealth: Payer: Self-pay | Admitting: *Deleted

## 2021-12-17 MED ORDER — FOSFOMYCIN TROMETHAMINE 3 G PO PACK: 3.0000 g | PACK | Freq: Once | ORAL | 0 refills | Status: AC

## 2021-12-17 NOTE — Telephone Encounter (Signed)
Patient's wife Santiago Glad, called to request home health order for urinalysis. Patient has been been having mental confusion, agitation. Denies any other symptoms. Completed Patient had SPT tube placed on 12/05/21. Unable to have transportation. Completed Bactrim that was prescribed by PCP on 12/10/21.

## 2021-12-17 NOTE — Telephone Encounter (Signed)
Spoke with patient's wife and advised results rx sent to pharmacy by e-script  

## 2021-12-17 NOTE — Telephone Encounter (Signed)
Based on his most recent urine culture, it's possible the Levaquin was insufficient to treat his infection. Please send in fosfomycin 3g x1 dose to his preferred pharmacy. Please also counsel Mrs. Denbleyker that catheterized urine may be strong smelling or cloudy and these are normal findings not indicative of infection. Signs of infection in catheterized patients include lower abdominal pain, low back pain, fever, chills, nausea, or vomiting.

## 2021-12-26 DIAGNOSIS — N390 Urinary tract infection, site not specified: Secondary | ICD-10-CM | POA: Diagnosis not present

## 2021-12-26 DIAGNOSIS — R339 Retention of urine, unspecified: Secondary | ICD-10-CM | POA: Diagnosis not present

## 2021-12-26 DIAGNOSIS — L89323 Pressure ulcer of left buttock, stage 3: Secondary | ICD-10-CM | POA: Diagnosis not present

## 2021-12-26 DIAGNOSIS — I129 Hypertensive chronic kidney disease with stage 1 through stage 4 chronic kidney disease, or unspecified chronic kidney disease: Secondary | ICD-10-CM | POA: Diagnosis not present

## 2021-12-26 DIAGNOSIS — N401 Enlarged prostate with lower urinary tract symptoms: Secondary | ICD-10-CM | POA: Diagnosis not present

## 2021-12-26 DIAGNOSIS — N183 Chronic kidney disease, stage 3 unspecified: Secondary | ICD-10-CM | POA: Diagnosis not present

## 2022-01-01 DIAGNOSIS — Z87440 Personal history of urinary (tract) infections: Secondary | ICD-10-CM | POA: Diagnosis not present

## 2022-01-01 DIAGNOSIS — G9332 Myalgic encephalomyelitis/chronic fatigue syndrome: Secondary | ICD-10-CM | POA: Diagnosis not present

## 2022-01-02 ENCOUNTER — Ambulatory Visit: Payer: Medicare Other | Admitting: Physician Assistant

## 2022-01-02 DIAGNOSIS — I129 Hypertensive chronic kidney disease with stage 1 through stage 4 chronic kidney disease, or unspecified chronic kidney disease: Secondary | ICD-10-CM | POA: Diagnosis not present

## 2022-01-02 DIAGNOSIS — R339 Retention of urine, unspecified: Secondary | ICD-10-CM | POA: Diagnosis not present

## 2022-01-02 DIAGNOSIS — N183 Chronic kidney disease, stage 3 unspecified: Secondary | ICD-10-CM | POA: Diagnosis not present

## 2022-01-02 DIAGNOSIS — N401 Enlarged prostate with lower urinary tract symptoms: Secondary | ICD-10-CM | POA: Diagnosis not present

## 2022-01-02 DIAGNOSIS — L89323 Pressure ulcer of left buttock, stage 3: Secondary | ICD-10-CM | POA: Diagnosis not present

## 2022-01-02 DIAGNOSIS — N390 Urinary tract infection, site not specified: Secondary | ICD-10-CM | POA: Diagnosis not present

## 2022-01-09 DIAGNOSIS — N390 Urinary tract infection, site not specified: Secondary | ICD-10-CM | POA: Diagnosis not present

## 2022-01-09 DIAGNOSIS — N401 Enlarged prostate with lower urinary tract symptoms: Secondary | ICD-10-CM | POA: Diagnosis not present

## 2022-01-09 DIAGNOSIS — N183 Chronic kidney disease, stage 3 unspecified: Secondary | ICD-10-CM | POA: Diagnosis not present

## 2022-01-09 DIAGNOSIS — I129 Hypertensive chronic kidney disease with stage 1 through stage 4 chronic kidney disease, or unspecified chronic kidney disease: Secondary | ICD-10-CM | POA: Diagnosis not present

## 2022-01-09 DIAGNOSIS — R339 Retention of urine, unspecified: Secondary | ICD-10-CM | POA: Diagnosis not present

## 2022-01-09 DIAGNOSIS — L89323 Pressure ulcer of left buttock, stage 3: Secondary | ICD-10-CM | POA: Diagnosis not present

## 2022-01-10 ENCOUNTER — Encounter (INDEPENDENT_AMBULATORY_CARE_PROVIDER_SITE_OTHER): Payer: Medicare Other | Admitting: Family Medicine

## 2022-01-10 DIAGNOSIS — E1140 Type 2 diabetes mellitus with diabetic neuropathy, unspecified: Secondary | ICD-10-CM | POA: Diagnosis not present

## 2022-01-10 DIAGNOSIS — M2450 Contracture, unspecified joint: Secondary | ICD-10-CM | POA: Diagnosis not present

## 2022-01-10 DIAGNOSIS — I129 Hypertensive chronic kidney disease with stage 1 through stage 4 chronic kidney disease, or unspecified chronic kidney disease: Secondary | ICD-10-CM | POA: Diagnosis not present

## 2022-01-10 DIAGNOSIS — N401 Enlarged prostate with lower urinary tract symptoms: Secondary | ICD-10-CM | POA: Diagnosis not present

## 2022-01-10 DIAGNOSIS — F03911 Unspecified dementia, unspecified severity, with agitation: Secondary | ICD-10-CM | POA: Diagnosis not present

## 2022-01-10 DIAGNOSIS — R262 Difficulty in walking, not elsewhere classified: Secondary | ICD-10-CM | POA: Diagnosis not present

## 2022-01-10 DIAGNOSIS — N183 Chronic kidney disease, stage 3 unspecified: Secondary | ICD-10-CM

## 2022-01-10 DIAGNOSIS — Z794 Long term (current) use of insulin: Secondary | ICD-10-CM

## 2022-01-10 DIAGNOSIS — R339 Retention of urine, unspecified: Secondary | ICD-10-CM | POA: Diagnosis not present

## 2022-01-10 DIAGNOSIS — N390 Urinary tract infection, site not specified: Secondary | ICD-10-CM | POA: Diagnosis not present

## 2022-01-12 DIAGNOSIS — R262 Difficulty in walking, not elsewhere classified: Secondary | ICD-10-CM | POA: Diagnosis not present

## 2022-01-12 DIAGNOSIS — Z466 Encounter for fitting and adjustment of urinary device: Secondary | ICD-10-CM | POA: Diagnosis not present

## 2022-01-12 DIAGNOSIS — N390 Urinary tract infection, site not specified: Secondary | ICD-10-CM | POA: Diagnosis not present

## 2022-01-12 DIAGNOSIS — F419 Anxiety disorder, unspecified: Secondary | ICD-10-CM | POA: Diagnosis not present

## 2022-01-12 DIAGNOSIS — Z435 Encounter for attention to cystostomy: Secondary | ICD-10-CM | POA: Diagnosis not present

## 2022-01-12 DIAGNOSIS — N401 Enlarged prostate with lower urinary tract symptoms: Secondary | ICD-10-CM | POA: Diagnosis not present

## 2022-01-12 DIAGNOSIS — I129 Hypertensive chronic kidney disease with stage 1 through stage 4 chronic kidney disease, or unspecified chronic kidney disease: Secondary | ICD-10-CM | POA: Diagnosis not present

## 2022-01-12 DIAGNOSIS — Z792 Long term (current) use of antibiotics: Secondary | ICD-10-CM | POA: Diagnosis not present

## 2022-01-12 DIAGNOSIS — R339 Retention of urine, unspecified: Secondary | ICD-10-CM | POA: Diagnosis not present

## 2022-01-12 DIAGNOSIS — N183 Chronic kidney disease, stage 3 unspecified: Secondary | ICD-10-CM | POA: Diagnosis not present

## 2022-01-12 DIAGNOSIS — F03911 Unspecified dementia, unspecified severity, with agitation: Secondary | ICD-10-CM | POA: Diagnosis not present

## 2022-01-12 DIAGNOSIS — Z7984 Long term (current) use of oral hypoglycemic drugs: Secondary | ICD-10-CM | POA: Diagnosis not present

## 2022-01-12 DIAGNOSIS — Z794 Long term (current) use of insulin: Secondary | ICD-10-CM | POA: Diagnosis not present

## 2022-01-12 DIAGNOSIS — M2450 Contracture, unspecified joint: Secondary | ICD-10-CM | POA: Diagnosis not present

## 2022-01-12 DIAGNOSIS — F329 Major depressive disorder, single episode, unspecified: Secondary | ICD-10-CM | POA: Diagnosis not present

## 2022-01-12 DIAGNOSIS — E1140 Type 2 diabetes mellitus with diabetic neuropathy, unspecified: Secondary | ICD-10-CM | POA: Diagnosis not present

## 2022-01-14 ENCOUNTER — Other Ambulatory Visit: Payer: Self-pay

## 2022-01-14 DIAGNOSIS — Z87440 Personal history of urinary (tract) infections: Secondary | ICD-10-CM | POA: Diagnosis not present

## 2022-01-16 ENCOUNTER — Telehealth: Payer: Self-pay

## 2022-01-16 DIAGNOSIS — E1140 Type 2 diabetes mellitus with diabetic neuropathy, unspecified: Secondary | ICD-10-CM | POA: Diagnosis not present

## 2022-01-16 DIAGNOSIS — R339 Retention of urine, unspecified: Secondary | ICD-10-CM | POA: Diagnosis not present

## 2022-01-16 DIAGNOSIS — N183 Chronic kidney disease, stage 3 unspecified: Secondary | ICD-10-CM | POA: Diagnosis not present

## 2022-01-16 DIAGNOSIS — N401 Enlarged prostate with lower urinary tract symptoms: Secondary | ICD-10-CM | POA: Diagnosis not present

## 2022-01-16 DIAGNOSIS — I129 Hypertensive chronic kidney disease with stage 1 through stage 4 chronic kidney disease, or unspecified chronic kidney disease: Secondary | ICD-10-CM | POA: Diagnosis not present

## 2022-01-16 DIAGNOSIS — N390 Urinary tract infection, site not specified: Secondary | ICD-10-CM | POA: Diagnosis not present

## 2022-01-16 NOTE — Telephone Encounter (Signed)
Copied from [place] [id number]. Topic: Quick Communication - Home Health Verbal Orders >> Jan 16, 2022  9:02 AM Loma [place] wrote: Caller/Agency: [place] Number: [phone] Requesting Skilled Nursing Pt is already going out on a call this am for pt having cathter issues. Additional request is wife called her as pt has not had a BM for over a week. She is requesting when she goes out this am to get pt an enema as well. Needs cb asap as prior appt was this am Frequency: 1

## 2022-01-16 NOTE — Telephone Encounter (Signed)
Sonya, nurse with called in reference to an order for enema. Advised ok per Dr. Brita Romp, she verbalized understanding.

## 2022-01-16 NOTE — Telephone Encounter (Signed)
OK for verbals 

## 2022-01-17 ENCOUNTER — Telehealth: Payer: Self-pay

## 2022-01-17 DIAGNOSIS — R339 Retention of urine, unspecified: Secondary | ICD-10-CM | POA: Diagnosis not present

## 2022-01-17 DIAGNOSIS — E1140 Type 2 diabetes mellitus with diabetic neuropathy, unspecified: Secondary | ICD-10-CM | POA: Diagnosis not present

## 2022-01-17 DIAGNOSIS — N183 Chronic kidney disease, stage 3 unspecified: Secondary | ICD-10-CM | POA: Diagnosis not present

## 2022-01-17 DIAGNOSIS — I129 Hypertensive chronic kidney disease with stage 1 through stage 4 chronic kidney disease, or unspecified chronic kidney disease: Secondary | ICD-10-CM | POA: Diagnosis not present

## 2022-01-17 DIAGNOSIS — N401 Enlarged prostate with lower urinary tract symptoms: Secondary | ICD-10-CM | POA: Diagnosis not present

## 2022-01-17 DIAGNOSIS — N390 Urinary tract infection, site not specified: Secondary | ICD-10-CM | POA: Diagnosis not present

## 2022-01-17 NOTE — Telephone Encounter (Signed)
Zara Council A, PA-C  You 52 minutes ago (9:35 AM)   Urine will also follow the path of least resistance, so if there is abdominal pressure or constipation this can also force urine out of the penis.  As long as there are no complaints of pain or blood in the urine and the SPT is easily irrigated, no further intervention is needed   Called home health nurse. No answer, left detailed message informing nurse of the information above. Advised to call back for questions or concerns.

## 2022-01-17 NOTE — Telephone Encounter (Signed)
Incoming call from Mitzi Hansen, RN with [facility]. She states that this pt has an SPT placed and is having some incidents of urine leakage via the penis x 2 days, she believes these to be bladder spasms based on her assessment. She questions if any action is needed. Pt denies pain and catheter is able to be irrigated easily. Catheter last changed 01/02/22. Please advise.

## 2022-01-29 ENCOUNTER — Other Ambulatory Visit: Payer: Self-pay

## 2022-01-29 ENCOUNTER — Other Ambulatory Visit: Payer: Medicare Other | Admitting: Student

## 2022-01-29 DIAGNOSIS — N138 Other obstructive and reflux uropathy: Secondary | ICD-10-CM

## 2022-01-29 DIAGNOSIS — F03911 Unspecified dementia, unspecified severity, with agitation: Secondary | ICD-10-CM | POA: Diagnosis not present

## 2022-01-29 DIAGNOSIS — F03918 Unspecified dementia, unspecified severity, with other behavioral disturbance: Secondary | ICD-10-CM | POA: Diagnosis not present

## 2022-01-29 DIAGNOSIS — N401 Enlarged prostate with lower urinary tract symptoms: Secondary | ICD-10-CM | POA: Diagnosis not present

## 2022-01-29 DIAGNOSIS — Z515 Encounter for palliative care: Secondary | ICD-10-CM

## 2022-01-29 DIAGNOSIS — F419 Anxiety disorder, unspecified: Secondary | ICD-10-CM | POA: Diagnosis not present

## 2022-01-29 DIAGNOSIS — R52 Pain, unspecified: Secondary | ICD-10-CM | POA: Diagnosis not present

## 2022-01-29 NOTE — Progress Notes (Signed)
[facility] Palliative Care Consult Note Telephone: [phone]  Fax: [phone]    Date of encounter: 01/29/22 1:54 PM PATIENT NAME: Norman Palmer [address] [place] [address]   [phone] (home)  DOB: 07/01/1941 MRN: [id number] PRIMARY CARE PROVIDER:    Birdie Sons, MD,  [address] [place] [address] [phone]  REFERRING PROVIDER:   Birdie Palmer, [place] [address] [place],  [place] [address] [phone]  RESPONSIBLE PARTY:    Contact Information     Name Relation Home Work [place]  [phone]   Norman Palmer   [phone]   Norman Palmer Relative   [phone]        I met face to face with patient and family in the home. Palliative Care was asked to follow this patient by consultation request of  Fisher, Kirstie Peri, MD to address advance care planning and complex medical decision making. This is a follow up visit.                                   ASSESSMENT AND PLAN / RECOMMENDATIONS:   Advance Care Planning/Goals of Care: Goals include to maximize quality of life and symptom management. Patient/health care surrogate gave his/her permission to discuss. Our advance care planning conversation included a discussion about:    The value and importance of advance care planning  Experiences with loved ones who have been seriously ill or have died  Exploration of personal, cultural or spiritual beliefs that might influence medical decisions  Exploration of goals of care in the event of a sudden injury or illness  Identification of a healthcare agent  CODE STATUS: DNR  Patient with functional and cognitive declines, weight loss since last palliative visit. We discussed Palliative Medicine vs. Hospice services. Wife would like for patient to be managed in the home. Will discuss with hospice medical director eligibility given his further  declines.  Symptom Management/Plan:   Dementia-patient with increased difficulty communicating, confusion. Increased weakness and functional decline. He is dependent for all adl's, bed bound. He is unable to feed himself. Has 24/7 caregivers. Caregivers and family to continue providing supportive care. Feed patient, monitor for aspiration. He is having worsening anxiety,agitation and tremors. Tremors appear to be worse when he is anxious. Anxiety/agitation-recommend restarting Restart divalproex at 125 mg daily x 1 week, then increase BID, may use xanax 0.61m 1/2 tablet TID PRN as wife feels full tablet caused increased somnolence. Continue sertraline and risperidone as directed.  Pain-patient with chronic pain, neuropathy. Worsening stiffness and pain with movement. Recommend increasing tramadol to TID; continue Celebrex and Cymbalta as directed.   BPH with obstruction, urethral stricture- patient with suprapubic catheter; changed out monthly. Continue Rapaflo as directed.   Follow up Palliative Care Visit: Palliative care will continue to follow for complex medical decision making, advance care planning, and clarification of goals. Return  weeks or prn.  This visit was coded based on medical decision making (MDM).  PPS: 30%  HOSPICE ELIGIBILITY/DIAGNOSIS: TBD  Chief Complaint: Palliative Medicine follow up visit.   HISTORY OF PRESENT ILLNESS:  Norman PANDYAis a 81y.o. 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 Most recent hospitalization 10/17-10/19/2022 due to urethral stricture, unable to place foley in urology.  Patient with further functional decline since last visit. He is much weaker, unable to feed himself. Wife  states weakness started in right hand and then left hand became weaker. Can no longer lift extremities. He is having increased anxiety and tremors. Patient with multiple medications discontinued recently per wife. She is unsure if tremor worsened since medications stopped. He is also having more pain with turning and repositioning. Increased difficulty communicating, confusion, more withdrawn, less engaged. He now has a suprapubic catheter; changed out monthly. Taking metformin, but no insulin. Sugars have been 160's to 170's. It is taking much longer to eat. Doesn't drink well, coughs and chokes on liquids. Treated for UTI in past month; has been treated for UTI maybe 6 times in the past year. Sleeping more; around 15 hours a day. Patient with a recurrent stage 3 wound to sacrum; area is now healed, but he now has a new wound developed next to old site on buttocks. Wife states therapy had attempted to work with patient last week, but he was unable to participate. HPI and ROS obtained per family and caregivers due to his dementia and agitation.     History obtained from review of EMR, discussion with primary team, and interview with family, facility staff/caregiver and/or [name]  I reviewed available labs, medications, imaging, studies and related documents from the EMR.  Records reviewed and summarized above.    Physical Exam: Pulse 68, resp 18, b/p 138/78, sats 93-94% on room air Constitutional: NAD General: frail appearing, obese  EYES: anicteric sclera, lids intact, no discharge  ENMT: intact hearing, oral mucous membranes moist, dentition intact CV: S1S2, RRR, no LE edema Pulmonary: LCTA, no increased work of breathing, no cough, room air Abdomen: normo-active BS + 4 quadrants, soft and non tender GU: deferred MSK: bed bound, non- ambulatory Skin: warm and dry, no rashes or wounds on visible skin; unable to assess wound to buttocks Neuro: generalized weakness, A & O to person,  familiars usually Psych: anxious, agitated Hem/lymph/immuno: no widespread bruising   Thank you for the opportunity to participate in the care of [name]  The palliative care team will continue to follow. Please call our office at [phone] if we can be of additional assistance.   Ezekiel Slocumb, NP   COVID-19 PATIENT SCREENING TOOL Asked and negative response unless otherwise noted:   Have you had symptoms of covid, tested positive or been in contact with someone with symptoms/positive test in the past 5-10 days? No

## 2022-01-31 ENCOUNTER — Telehealth: Payer: Self-pay

## 2022-01-31 ENCOUNTER — Telehealth: Payer: Self-pay | Admitting: Student

## 2022-01-31 DIAGNOSIS — F03918 Unspecified dementia, unspecified severity, with other behavioral disturbance: Secondary | ICD-10-CM

## 2022-01-31 NOTE — Telephone Encounter (Signed)
Charlann Boxer, NP from [facility] called back, she was advised of the message from Dr. Caryn Section below. She says the referral is for Dementia and the Diagnosis Code is: F03.918.

## 2022-01-31 NOTE — Telephone Encounter (Signed)
Referral order placed.

## 2022-01-31 NOTE — Telephone Encounter (Signed)
Tried Solicitor. Left message to call back. OK for [facility] triage to advise.   Dr. Caryn Section I have the hospice order pending in this phone encounter. Do you have a particular dx code to associate with referral order?

## 2022-01-31 NOTE — Addendum Note (Signed)
Addended by: Randal Buba on: 01/31/2022 01:44 PM   Modules accepted: Orders

## 2022-01-31 NOTE — Telephone Encounter (Signed)
That's fine

## 2022-01-31 NOTE — Telephone Encounter (Signed)
Spoke with patient's wife regarding approval to proceed with hospice evaluation. Dr. Maralyn Sago office notified with request for hospice order and if he will serve as hospice attending; awaiting response.

## 2022-01-31 NOTE — Telephone Encounter (Signed)
Copied from [place] [id number]. Topic: General - Other >> Jan 31, 2022 11:20 AM Valere Dross wrote: Reason for CRM: Charlann Boxer called in from [facility] requesting if PCP could send over a referral for Hospice care, and be the attending provider, she requested a call back at [phone]. Please advise.

## 2022-01-31 NOTE — Addendum Note (Signed)
Addended by: Randal Buba on: 01/31/2022 04:32 PM   Modules accepted: Orders

## 2022-02-02 DIAGNOSIS — N183 Chronic kidney disease, stage 3 unspecified: Secondary | ICD-10-CM | POA: Diagnosis not present

## 2022-02-02 DIAGNOSIS — Z87440 Personal history of urinary (tract) infections: Secondary | ICD-10-CM | POA: Diagnosis not present

## 2022-02-02 DIAGNOSIS — G4733 Obstructive sleep apnea (adult) (pediatric): Secondary | ICD-10-CM | POA: Diagnosis not present

## 2022-02-02 DIAGNOSIS — G319 Degenerative disease of nervous system, unspecified: Secondary | ICD-10-CM | POA: Diagnosis not present

## 2022-02-02 DIAGNOSIS — F02C18 Dementia in other diseases classified elsewhere, severe, with other behavioral disturbance: Secondary | ICD-10-CM | POA: Diagnosis not present

## 2022-02-02 DIAGNOSIS — E1122 Type 2 diabetes mellitus with diabetic chronic kidney disease: Secondary | ICD-10-CM | POA: Diagnosis not present

## 2022-02-02 DIAGNOSIS — E1151 Type 2 diabetes mellitus with diabetic peripheral angiopathy without gangrene: Secondary | ICD-10-CM | POA: Diagnosis not present

## 2022-02-02 DIAGNOSIS — E785 Hyperlipidemia, unspecified: Secondary | ICD-10-CM | POA: Diagnosis not present

## 2022-02-02 DIAGNOSIS — C4481 Basal cell carcinoma of overlapping sites of skin: Secondary | ICD-10-CM | POA: Diagnosis not present

## 2022-02-02 DIAGNOSIS — M4800 Spinal stenosis, site unspecified: Secondary | ICD-10-CM | POA: Diagnosis not present

## 2022-02-02 DIAGNOSIS — N419 Inflammatory disease of prostate, unspecified: Secondary | ICD-10-CM | POA: Diagnosis not present

## 2022-02-02 DIAGNOSIS — R1310 Dysphagia, unspecified: Secondary | ICD-10-CM | POA: Diagnosis not present

## 2022-02-02 DIAGNOSIS — I129 Hypertensive chronic kidney disease with stage 1 through stage 4 chronic kidney disease, or unspecified chronic kidney disease: Secondary | ICD-10-CM | POA: Diagnosis not present

## 2022-02-02 DIAGNOSIS — N400 Enlarged prostate without lower urinary tract symptoms: Secondary | ICD-10-CM | POA: Diagnosis not present

## 2022-02-02 DIAGNOSIS — E1142 Type 2 diabetes mellitus with diabetic polyneuropathy: Secondary | ICD-10-CM | POA: Diagnosis not present

## 2022-02-02 DIAGNOSIS — I872 Venous insufficiency (chronic) (peripheral): Secondary | ICD-10-CM | POA: Diagnosis not present

## 2022-02-03 ENCOUNTER — Other Ambulatory Visit: Payer: Self-pay | Admitting: Family Medicine

## 2022-02-03 DIAGNOSIS — R1310 Dysphagia, unspecified: Secondary | ICD-10-CM | POA: Diagnosis not present

## 2022-02-03 DIAGNOSIS — F02C18 Dementia in other diseases classified elsewhere, severe, with other behavioral disturbance: Secondary | ICD-10-CM | POA: Diagnosis not present

## 2022-02-03 DIAGNOSIS — G9009 Other idiopathic peripheral autonomic neuropathy: Secondary | ICD-10-CM

## 2022-02-03 DIAGNOSIS — E1142 Type 2 diabetes mellitus with diabetic polyneuropathy: Secondary | ICD-10-CM | POA: Diagnosis not present

## 2022-02-03 DIAGNOSIS — E1122 Type 2 diabetes mellitus with diabetic chronic kidney disease: Secondary | ICD-10-CM | POA: Diagnosis not present

## 2022-02-03 DIAGNOSIS — G319 Degenerative disease of nervous system, unspecified: Secondary | ICD-10-CM | POA: Diagnosis not present

## 2022-02-03 DIAGNOSIS — I129 Hypertensive chronic kidney disease with stage 1 through stage 4 chronic kidney disease, or unspecified chronic kidney disease: Secondary | ICD-10-CM | POA: Diagnosis not present

## 2022-02-06 DIAGNOSIS — E1142 Type 2 diabetes mellitus with diabetic polyneuropathy: Secondary | ICD-10-CM | POA: Diagnosis not present

## 2022-02-06 DIAGNOSIS — F02C18 Dementia in other diseases classified elsewhere, severe, with other behavioral disturbance: Secondary | ICD-10-CM | POA: Diagnosis not present

## 2022-02-06 DIAGNOSIS — I129 Hypertensive chronic kidney disease with stage 1 through stage 4 chronic kidney disease, or unspecified chronic kidney disease: Secondary | ICD-10-CM | POA: Diagnosis not present

## 2022-02-06 DIAGNOSIS — E1122 Type 2 diabetes mellitus with diabetic chronic kidney disease: Secondary | ICD-10-CM | POA: Diagnosis not present

## 2022-02-06 DIAGNOSIS — R1310 Dysphagia, unspecified: Secondary | ICD-10-CM | POA: Diagnosis not present

## 2022-02-06 DIAGNOSIS — G319 Degenerative disease of nervous system, unspecified: Secondary | ICD-10-CM | POA: Diagnosis not present

## 2022-02-11 DIAGNOSIS — G319 Degenerative disease of nervous system, unspecified: Secondary | ICD-10-CM | POA: Diagnosis not present

## 2022-02-11 DIAGNOSIS — F02C18 Dementia in other diseases classified elsewhere, severe, with other behavioral disturbance: Secondary | ICD-10-CM | POA: Diagnosis not present

## 2022-02-11 DIAGNOSIS — E1142 Type 2 diabetes mellitus with diabetic polyneuropathy: Secondary | ICD-10-CM | POA: Diagnosis not present

## 2022-02-11 DIAGNOSIS — E1122 Type 2 diabetes mellitus with diabetic chronic kidney disease: Secondary | ICD-10-CM | POA: Diagnosis not present

## 2022-02-11 DIAGNOSIS — R1310 Dysphagia, unspecified: Secondary | ICD-10-CM | POA: Diagnosis not present

## 2022-02-11 DIAGNOSIS — I129 Hypertensive chronic kidney disease with stage 1 through stage 4 chronic kidney disease, or unspecified chronic kidney disease: Secondary | ICD-10-CM | POA: Diagnosis not present

## 2022-02-13 DIAGNOSIS — R1310 Dysphagia, unspecified: Secondary | ICD-10-CM | POA: Diagnosis not present

## 2022-02-13 DIAGNOSIS — E1142 Type 2 diabetes mellitus with diabetic polyneuropathy: Secondary | ICD-10-CM | POA: Diagnosis not present

## 2022-02-13 DIAGNOSIS — F02C18 Dementia in other diseases classified elsewhere, severe, with other behavioral disturbance: Secondary | ICD-10-CM | POA: Diagnosis not present

## 2022-02-13 DIAGNOSIS — I129 Hypertensive chronic kidney disease with stage 1 through stage 4 chronic kidney disease, or unspecified chronic kidney disease: Secondary | ICD-10-CM | POA: Diagnosis not present

## 2022-02-13 DIAGNOSIS — G319 Degenerative disease of nervous system, unspecified: Secondary | ICD-10-CM | POA: Diagnosis not present

## 2022-02-13 DIAGNOSIS — E1122 Type 2 diabetes mellitus with diabetic chronic kidney disease: Secondary | ICD-10-CM | POA: Diagnosis not present

## 2022-02-18 ENCOUNTER — Telehealth: Payer: Self-pay | Admitting: Family Medicine

## 2022-02-18 DIAGNOSIS — F02C18 Dementia in other diseases classified elsewhere, severe, with other behavioral disturbance: Secondary | ICD-10-CM | POA: Diagnosis not present

## 2022-02-18 DIAGNOSIS — R1310 Dysphagia, unspecified: Secondary | ICD-10-CM | POA: Diagnosis not present

## 2022-02-18 DIAGNOSIS — I129 Hypertensive chronic kidney disease with stage 1 through stage 4 chronic kidney disease, or unspecified chronic kidney disease: Secondary | ICD-10-CM | POA: Diagnosis not present

## 2022-02-18 DIAGNOSIS — E1122 Type 2 diabetes mellitus with diabetic chronic kidney disease: Secondary | ICD-10-CM | POA: Diagnosis not present

## 2022-02-18 DIAGNOSIS — G319 Degenerative disease of nervous system, unspecified: Secondary | ICD-10-CM | POA: Diagnosis not present

## 2022-02-18 DIAGNOSIS — E1142 Type 2 diabetes mellitus with diabetic polyneuropathy: Secondary | ICD-10-CM | POA: Diagnosis not present

## 2022-02-18 MED ORDER — MORPHINE SULFATE (CONCENTRATE) 20 MG/ML PO SOLN: 5.0000 mg | ORAL | 0 refills | Status: AC | PRN

## 2022-02-18 NOTE — Telephone Encounter (Signed)
I called and advosed Mitzi that Morphine prescription has been sent into pharmacy. Mitzi wanted to know whether Dr. Caryn Section wanted to make any changes to Cymbalta. She states patient is having difficulty swallowing so the pts wife has been crushing his DULoxetine (CYMBALTA) 30 MG capsule and divalproex (DEPAKOTE) 125 MG DR tablet / in case Dr Caryn Section wants to make any changes to these / please advise

## 2022-02-18 NOTE — Telephone Encounter (Signed)
Mitzi from [facility] called to request liquid morphine 20MG  per 1ML / with directions of 5MG  or 0.25ML every 2 hrs for pain (the 30ML bottle)/ she stated he needs this for pain with movement and bathing / please advise and send to  [place], [place] Phone:  [phone]  Fax:  [phone]     She also stated he is having difficulty swallowing so the pts wife has been crushing his DULoxetine (CYMBALTA) 30 MG capsule and divalproex (DEPAKOTE) 125 MG DR tablet / in case Dr Caryn Section wants to make any changes to these / please advise

## 2022-02-18 NOTE — Telephone Encounter (Signed)
OK, have sent prescription [facility]

## 2022-02-19 DIAGNOSIS — G319 Degenerative disease of nervous system, unspecified: Secondary | ICD-10-CM | POA: Diagnosis not present

## 2022-02-19 DIAGNOSIS — N400 Enlarged prostate without lower urinary tract symptoms: Secondary | ICD-10-CM | POA: Diagnosis not present

## 2022-02-19 DIAGNOSIS — Z87440 Personal history of urinary (tract) infections: Secondary | ICD-10-CM | POA: Diagnosis not present

## 2022-02-19 DIAGNOSIS — C4481 Basal cell carcinoma of overlapping sites of skin: Secondary | ICD-10-CM | POA: Diagnosis not present

## 2022-02-19 DIAGNOSIS — F02C18 Dementia in other diseases classified elsewhere, severe, with other behavioral disturbance: Secondary | ICD-10-CM | POA: Diagnosis not present

## 2022-02-19 DIAGNOSIS — R1310 Dysphagia, unspecified: Secondary | ICD-10-CM | POA: Diagnosis not present

## 2022-02-19 DIAGNOSIS — I129 Hypertensive chronic kidney disease with stage 1 through stage 4 chronic kidney disease, or unspecified chronic kidney disease: Secondary | ICD-10-CM | POA: Diagnosis not present

## 2022-02-19 DIAGNOSIS — I872 Venous insufficiency (chronic) (peripheral): Secondary | ICD-10-CM | POA: Diagnosis not present

## 2022-02-19 DIAGNOSIS — N419 Inflammatory disease of prostate, unspecified: Secondary | ICD-10-CM | POA: Diagnosis not present

## 2022-02-19 DIAGNOSIS — E1151 Type 2 diabetes mellitus with diabetic peripheral angiopathy without gangrene: Secondary | ICD-10-CM | POA: Diagnosis not present

## 2022-02-19 DIAGNOSIS — E785 Hyperlipidemia, unspecified: Secondary | ICD-10-CM | POA: Diagnosis not present

## 2022-02-19 DIAGNOSIS — E1142 Type 2 diabetes mellitus with diabetic polyneuropathy: Secondary | ICD-10-CM | POA: Diagnosis not present

## 2022-02-19 DIAGNOSIS — E1122 Type 2 diabetes mellitus with diabetic chronic kidney disease: Secondary | ICD-10-CM | POA: Diagnosis not present

## 2022-02-19 DIAGNOSIS — N183 Chronic kidney disease, stage 3 unspecified: Secondary | ICD-10-CM | POA: Diagnosis not present

## 2022-02-19 DIAGNOSIS — G4733 Obstructive sleep apnea (adult) (pediatric): Secondary | ICD-10-CM | POA: Diagnosis not present

## 2022-02-19 DIAGNOSIS — M4800 Spinal stenosis, site unspecified: Secondary | ICD-10-CM | POA: Diagnosis not present

## 2022-02-19 NOTE — Telephone Encounter (Signed)
They should continue crushing these. They do not come in a liquid form.  ?

## 2022-02-19 NOTE — Telephone Encounter (Signed)
I called Mitzi and advised her of Dr. Maralyn Sago recommendation below.  ?

## 2022-02-20 DIAGNOSIS — I129 Hypertensive chronic kidney disease with stage 1 through stage 4 chronic kidney disease, or unspecified chronic kidney disease: Secondary | ICD-10-CM | POA: Diagnosis not present

## 2022-02-20 DIAGNOSIS — G319 Degenerative disease of nervous system, unspecified: Secondary | ICD-10-CM | POA: Diagnosis not present

## 2022-02-20 DIAGNOSIS — E1122 Type 2 diabetes mellitus with diabetic chronic kidney disease: Secondary | ICD-10-CM | POA: Diagnosis not present

## 2022-02-20 DIAGNOSIS — F02C18 Dementia in other diseases classified elsewhere, severe, with other behavioral disturbance: Secondary | ICD-10-CM | POA: Diagnosis not present

## 2022-02-20 DIAGNOSIS — R1310 Dysphagia, unspecified: Secondary | ICD-10-CM | POA: Diagnosis not present

## 2022-02-20 DIAGNOSIS — E1142 Type 2 diabetes mellitus with diabetic polyneuropathy: Secondary | ICD-10-CM | POA: Diagnosis not present

## 2022-02-24 DIAGNOSIS — R1310 Dysphagia, unspecified: Secondary | ICD-10-CM | POA: Diagnosis not present

## 2022-02-24 DIAGNOSIS — E1142 Type 2 diabetes mellitus with diabetic polyneuropathy: Secondary | ICD-10-CM | POA: Diagnosis not present

## 2022-02-24 DIAGNOSIS — F02C18 Dementia in other diseases classified elsewhere, severe, with other behavioral disturbance: Secondary | ICD-10-CM | POA: Diagnosis not present

## 2022-02-24 DIAGNOSIS — E1122 Type 2 diabetes mellitus with diabetic chronic kidney disease: Secondary | ICD-10-CM | POA: Diagnosis not present

## 2022-02-24 DIAGNOSIS — G319 Degenerative disease of nervous system, unspecified: Secondary | ICD-10-CM | POA: Diagnosis not present

## 2022-02-24 DIAGNOSIS — I129 Hypertensive chronic kidney disease with stage 1 through stage 4 chronic kidney disease, or unspecified chronic kidney disease: Secondary | ICD-10-CM | POA: Diagnosis not present

## 2022-02-25 DIAGNOSIS — I129 Hypertensive chronic kidney disease with stage 1 through stage 4 chronic kidney disease, or unspecified chronic kidney disease: Secondary | ICD-10-CM | POA: Diagnosis not present

## 2022-02-25 DIAGNOSIS — R1310 Dysphagia, unspecified: Secondary | ICD-10-CM | POA: Diagnosis not present

## 2022-02-25 DIAGNOSIS — E1122 Type 2 diabetes mellitus with diabetic chronic kidney disease: Secondary | ICD-10-CM | POA: Diagnosis not present

## 2022-02-25 DIAGNOSIS — F02C18 Dementia in other diseases classified elsewhere, severe, with other behavioral disturbance: Secondary | ICD-10-CM | POA: Diagnosis not present

## 2022-02-25 DIAGNOSIS — E1142 Type 2 diabetes mellitus with diabetic polyneuropathy: Secondary | ICD-10-CM | POA: Diagnosis not present

## 2022-02-25 DIAGNOSIS — G319 Degenerative disease of nervous system, unspecified: Secondary | ICD-10-CM | POA: Diagnosis not present

## 2022-02-28 ENCOUNTER — Ambulatory Visit: Payer: Self-pay

## 2022-02-28 NOTE — Telephone Encounter (Signed)
Patient's wife Santiago Glad advised.  ?

## 2022-02-28 NOTE — Telephone Encounter (Signed)
The Cymbalta is a capsule, they can open the capsule and sprinkle on his food.  ?

## 2022-02-28 NOTE — Telephone Encounter (Signed)
?  Chief Complaint: Cannot swallow medication ?Symptoms: Unable to swallow cybalta at all. Is chewing the depakote ?Frequency: past few days. ?Pertinent Negatives: Patient denies Pt is eating.  ?Disposition: '[]'$ ED /'[]'$ Urgent Care (no appt availability in office) / '[x]'$ Appointment(In office/virtual)/ '[]'$  [facility]/ '[]'$ Home Care/ '[]'$ Refused Recommended Disposition /'[]'$ [facility]/ '[]'$  Follow-up with PCP ?Additional Notes: Pt cannot be brought to office easily. This is new onset for pt. Pt is unable to swallow some of his medication. He has gotten most of them down but unable to swallow Cymbalta, and he has been chewing the depakote. Wife  would like a call from clinic. ? ? ? ?Reason for Disposition ? [1] Swallowing difficulty AND [2] cause unknown (Exception: difficulty swallowing is a chronic symptom) ? ?Answer Assessment - Initial Assessment Questions ?1. SYMPTOM: "Are you having difficulty swallowing liquids, solids, or both?" ?    Medication only ?2. ONSET: "When did the swallowing problems begin?"  ?    Last few days ?3. CAUSE: "What do you think is causing the problem?"  ?    unsure ?4. CHRONIC/RECURRENT: "Is this a new problem for you?"  If no, ask: "How long have you had this problem?" (e.g., days, weeks, months)  ?    days ?5. OTHER SYMPTOMS: "Do you have any other symptoms?" (e.g., difficulty breathing, sore throat, swollen tongue, chest pain) ?    Unsure ?6. PREGNANCY: "Is there any chance you are pregnant?" "When was your last menstrual period?" ?    na ? ?Protocols used: Swallowing Difficulty-A-AH ? ?

## 2022-03-03 DIAGNOSIS — R1310 Dysphagia, unspecified: Secondary | ICD-10-CM | POA: Diagnosis not present

## 2022-03-03 DIAGNOSIS — E1122 Type 2 diabetes mellitus with diabetic chronic kidney disease: Secondary | ICD-10-CM | POA: Diagnosis not present

## 2022-03-03 DIAGNOSIS — G319 Degenerative disease of nervous system, unspecified: Secondary | ICD-10-CM | POA: Diagnosis not present

## 2022-03-03 DIAGNOSIS — F02C18 Dementia in other diseases classified elsewhere, severe, with other behavioral disturbance: Secondary | ICD-10-CM | POA: Diagnosis not present

## 2022-03-03 DIAGNOSIS — I129 Hypertensive chronic kidney disease with stage 1 through stage 4 chronic kidney disease, or unspecified chronic kidney disease: Secondary | ICD-10-CM | POA: Diagnosis not present

## 2022-03-03 DIAGNOSIS — E1142 Type 2 diabetes mellitus with diabetic polyneuropathy: Secondary | ICD-10-CM | POA: Diagnosis not present

## 2022-03-04 DIAGNOSIS — E1122 Type 2 diabetes mellitus with diabetic chronic kidney disease: Secondary | ICD-10-CM | POA: Diagnosis not present

## 2022-03-04 DIAGNOSIS — I129 Hypertensive chronic kidney disease with stage 1 through stage 4 chronic kidney disease, or unspecified chronic kidney disease: Secondary | ICD-10-CM | POA: Diagnosis not present

## 2022-03-04 DIAGNOSIS — E1142 Type 2 diabetes mellitus with diabetic polyneuropathy: Secondary | ICD-10-CM | POA: Diagnosis not present

## 2022-03-04 DIAGNOSIS — R1310 Dysphagia, unspecified: Secondary | ICD-10-CM | POA: Diagnosis not present

## 2022-03-04 DIAGNOSIS — G319 Degenerative disease of nervous system, unspecified: Secondary | ICD-10-CM | POA: Diagnosis not present

## 2022-03-04 DIAGNOSIS — F02C18 Dementia in other diseases classified elsewhere, severe, with other behavioral disturbance: Secondary | ICD-10-CM | POA: Diagnosis not present

## 2022-03-05 DIAGNOSIS — I129 Hypertensive chronic kidney disease with stage 1 through stage 4 chronic kidney disease, or unspecified chronic kidney disease: Secondary | ICD-10-CM | POA: Diagnosis not present

## 2022-03-05 DIAGNOSIS — F02C18 Dementia in other diseases classified elsewhere, severe, with other behavioral disturbance: Secondary | ICD-10-CM | POA: Diagnosis not present

## 2022-03-05 DIAGNOSIS — E1142 Type 2 diabetes mellitus with diabetic polyneuropathy: Secondary | ICD-10-CM | POA: Diagnosis not present

## 2022-03-05 DIAGNOSIS — G319 Degenerative disease of nervous system, unspecified: Secondary | ICD-10-CM | POA: Diagnosis not present

## 2022-03-05 DIAGNOSIS — E1122 Type 2 diabetes mellitus with diabetic chronic kidney disease: Secondary | ICD-10-CM | POA: Diagnosis not present

## 2022-03-05 DIAGNOSIS — R1310 Dysphagia, unspecified: Secondary | ICD-10-CM | POA: Diagnosis not present

## 2022-03-06 ENCOUNTER — Telehealth: Payer: Self-pay

## 2022-03-06 DIAGNOSIS — R1310 Dysphagia, unspecified: Secondary | ICD-10-CM | POA: Diagnosis not present

## 2022-03-06 DIAGNOSIS — G319 Degenerative disease of nervous system, unspecified: Secondary | ICD-10-CM | POA: Diagnosis not present

## 2022-03-06 DIAGNOSIS — E1142 Type 2 diabetes mellitus with diabetic polyneuropathy: Secondary | ICD-10-CM | POA: Diagnosis not present

## 2022-03-06 DIAGNOSIS — I129 Hypertensive chronic kidney disease with stage 1 through stage 4 chronic kidney disease, or unspecified chronic kidney disease: Secondary | ICD-10-CM | POA: Diagnosis not present

## 2022-03-06 DIAGNOSIS — E1122 Type 2 diabetes mellitus with diabetic chronic kidney disease: Secondary | ICD-10-CM | POA: Diagnosis not present

## 2022-03-06 DIAGNOSIS — F02C18 Dementia in other diseases classified elsewhere, severe, with other behavioral disturbance: Secondary | ICD-10-CM | POA: Diagnosis not present

## 2022-03-06 NOTE — Telephone Encounter (Signed)
Copied from [place] [id number]. Topic: General - Deceased Patient >> Mar 08, 2022  4:14 PM Oneta Rack wrote: Caller wanted to inform PCP patient passed away at home at 3:35pm. >> 03-08-2022  4:29 PM Camille Bal, Gerlene Burdock wrote: Caller to correct time of death for patient. It is 3:25pm

## 2022-03-07 ENCOUNTER — Telehealth: Payer: Self-pay

## 2022-03-07 NOTE — Telephone Encounter (Signed)
Copied from [place] [id number]. Topic: General - Deceased Patient ?>> 01-Apr-2022  4:14 PM Oneta Rack wrote: ?Caller wanted to inform PCP patient passed away at home at 3:35pm. ?>> Apr 01, 2022  4:29 PM Benton, Gerlene Burdock wrote: ?Caller to correct time of death for patient. It is 3:25pm ?

## 2022-03-07 NOTE — Telephone Encounter (Signed)
Velva Harman with [facility] called in to ask if provider will sign off on pt's death certificate? She says that it is in the system. ? ? ? ? ?[phone] ?

## 2022-03-07 NOTE — Telephone Encounter (Signed)
Copied from [place] [id number]. Topic: General - Deceased Patient ?>> 03-08-22  4:14 PM Oneta Rack wrote: ?Caller wanted to inform PCP patient passed away at home at 3:35pm. ?>> 2022/03/08  4:29 PM Benton, Gerlene Burdock wrote: ?Caller to correct time of death for patient. It is 3:25pm ?

## 2022-03-22 DEATH — deceased

## 2022-12-02 ENCOUNTER — Ambulatory Visit: Payer: Medicare Other | Admitting: Physician Assistant
